# Patient Record
Sex: Female | Born: 1965
Health system: Southern US, Community
[De-identification: ages and names within clinical notes are randomized; demographics above are authoritative.]

## PROBLEM LIST (undated history)

## (undated) DIAGNOSIS — D649 Anemia, unspecified: Secondary | ICD-10-CM

## (undated) DIAGNOSIS — R05 Cough: Secondary | ICD-10-CM

## (undated) DIAGNOSIS — K219 Gastro-esophageal reflux disease without esophagitis: Secondary | ICD-10-CM

## (undated) DIAGNOSIS — J069 Acute upper respiratory infection, unspecified: Secondary | ICD-10-CM

## (undated) DIAGNOSIS — I10 Essential (primary) hypertension: Secondary | ICD-10-CM

## (undated) DIAGNOSIS — J40 Bronchitis, not specified as acute or chronic: Secondary | ICD-10-CM

## (undated) DIAGNOSIS — R059 Cough, unspecified: Secondary | ICD-10-CM

## (undated) DIAGNOSIS — E119 Type 2 diabetes mellitus without complications: Secondary | ICD-10-CM

## (undated) DIAGNOSIS — D259 Leiomyoma of uterus, unspecified: Secondary | ICD-10-CM

## (undated) DIAGNOSIS — M948X9 Other specified disorders of cartilage, unspecified sites: Secondary | ICD-10-CM

## (undated) DIAGNOSIS — M549 Dorsalgia, unspecified: Secondary | ICD-10-CM

## (undated) DIAGNOSIS — N979 Female infertility, unspecified: Secondary | ICD-10-CM

## (undated) DIAGNOSIS — N938 Other specified abnormal uterine and vaginal bleeding: Secondary | ICD-10-CM

## (undated) HISTORY — DX: Leiomyoma of uterus, unspecified: D25.9

## (undated) HISTORY — PX: SUPRACERVICAL ABDOMINAL HYSTERECTOMY: SHX5393

## (undated) HISTORY — DX: Gastro-esophageal reflux disease without esophagitis: K21.9

## (undated) HISTORY — DX: Female infertility, unspecified: N97.9

## (undated) HISTORY — PX: ENDOMETRIAL VAPORIZATION W/ VERSAPOINT: SUR435

## (undated) HISTORY — DX: Essential (primary) hypertension: I10

## (undated) HISTORY — DX: Other specified abnormal uterine and vaginal bleeding: N93.8

## (undated) HISTORY — DX: Type 2 diabetes mellitus without complications: E11.9

## (undated) HISTORY — DX: Other specified disorders of cartilage, unspecified sites: M94.8X9

## (undated) NOTE — *Deleted (*Deleted)
  HEALTH MAINTENANCE RECOMMENDATIONS:  It is recommended that you get at least 30 minutes of aerobic exercise at least 5 days/week (for weight loss, you may need as much as 60-90 minutes). This can be any activity that gets your heart rate up. This can be divided in 10-15 minute intervals if needed, but try and build up your endurance at least once a week.  Weight bearing exercise is also recommended twice weekly.  Eat a healthy diet with lots of vegetables, fruits and fiber.  "Colorful" foods have a lot of vitamins (ie green vegetables, tomatoes, red peppers, etc).  Limit sweet tea, regular sodas and alcoholic beverages, all of which has a lot of calories and sugar.  Up to 1 alcoholic drink daily may be beneficial for women (unless trying to lose weight, watch sugars).  Drink a lot of water.  Calcium recommendations are 1200-1500 mg daily (1500 mg for postmenopausal women or women without ovaries), and vitamin D 1000 IU daily.  This should be obtained from diet and/or supplements (vitamins), and calcium should not be taken all at once, but in divided doses.  Monthly self breast exams and yearly mammograms for women over the age of 52 is recommended.  Sunscreen of at least SPF 30 should be used on all sun-exposed parts of the skin when outside between the hours of 10 am and 4 pm (not just when at beach or pool, but even with exercise, golf, tennis, and yard work!)  Use a sunscreen that says "broad spectrum" so it covers both UVA and UVB rays, and make sure to reapply every 1-2 hours.  Remember to change the batteries in your smoke detectors when changing your clock times in the spring and fall. Carbon monoxide detectors are recommended for your home.  Use your seat belt every time you are in a car, and please drive safely and not be distracted with cell phones and texting while driving.  Pneumovax is recommended given your diabetes and medications.   COVID booster is also recommended (since you are  more than 6 months since your 2nd shot, and have diabetes and are on immunosuppressant medications).  Flu shots are recommended yearly as well.  I recommend getting the new shingles vaccine (Shingrix).  If you have commercial insurance, check with your insurance to verify what your out of pocket cost may be (usually covered as preventative, but better to verify to avoid any surprises, as this vaccine is expensive), and then schedule a nurse visit at our office when convenient (based on the possible side effects as discussed).   This is a series of 2 injections, spaced 2 months apart.  It doesn't have to be exactly 2 months apart (but can't be under 2 months), if that isn't feasible for your schedule, but try and get them close to 2 months (and definitely within 6 months of each other, or else the efficacy of the vaccine drops off).  The above-mentioned vaccines can either be given the same day, or should be separated by 2 weeks minimum.

---

## 1998-09-13 ENCOUNTER — Emergency Department (HOSPITAL_COMMUNITY): Admission: EM | Admit: 1998-09-13 | Discharge: 1998-09-13 | Payer: Self-pay | Admitting: Emergency Medicine

## 1999-10-28 ENCOUNTER — Other Ambulatory Visit: Admission: RE | Admit: 1999-10-28 | Discharge: 1999-10-28 | Payer: Self-pay | Admitting: Obstetrics and Gynecology

## 2000-02-22 ENCOUNTER — Inpatient Hospital Stay (HOSPITAL_COMMUNITY): Admission: AD | Admit: 2000-02-22 | Discharge: 2000-02-22 | Payer: Self-pay | Admitting: Obstetrics & Gynecology

## 2000-05-22 ENCOUNTER — Emergency Department (HOSPITAL_COMMUNITY): Admission: EM | Admit: 2000-05-22 | Discharge: 2000-05-22 | Payer: Self-pay | Admitting: Emergency Medicine

## 2000-05-22 ENCOUNTER — Encounter: Payer: Self-pay | Admitting: Emergency Medicine

## 2000-05-23 ENCOUNTER — Emergency Department (HOSPITAL_COMMUNITY): Admission: EM | Admit: 2000-05-23 | Discharge: 2000-05-23 | Payer: Self-pay | Admitting: Emergency Medicine

## 2000-07-05 HISTORY — PX: MYOMECTOMY: SHX85

## 2000-12-05 ENCOUNTER — Ambulatory Visit (HOSPITAL_COMMUNITY): Admission: RE | Admit: 2000-12-05 | Discharge: 2000-12-05 | Payer: Self-pay | Admitting: Obstetrics and Gynecology

## 2000-12-05 ENCOUNTER — Encounter: Payer: Self-pay | Admitting: Obstetrics and Gynecology

## 2000-12-19 ENCOUNTER — Encounter (INDEPENDENT_AMBULATORY_CARE_PROVIDER_SITE_OTHER): Payer: Self-pay | Admitting: Specialist

## 2000-12-19 ENCOUNTER — Inpatient Hospital Stay (HOSPITAL_COMMUNITY): Admission: RE | Admit: 2000-12-19 | Discharge: 2000-12-22 | Payer: Self-pay | Admitting: Obstetrics and Gynecology

## 2002-01-18 ENCOUNTER — Other Ambulatory Visit: Admission: RE | Admit: 2002-01-18 | Discharge: 2002-01-18 | Payer: Self-pay | Admitting: Obstetrics and Gynecology

## 2002-03-22 ENCOUNTER — Ambulatory Visit (HOSPITAL_COMMUNITY): Admission: RE | Admit: 2002-03-22 | Discharge: 2002-03-22 | Payer: Self-pay | Admitting: Obstetrics and Gynecology

## 2002-03-22 ENCOUNTER — Encounter: Payer: Self-pay | Admitting: Obstetrics and Gynecology

## 2002-04-29 ENCOUNTER — Inpatient Hospital Stay (HOSPITAL_COMMUNITY): Admission: AD | Admit: 2002-04-29 | Discharge: 2002-04-29 | Payer: Self-pay | Admitting: Obstetrics and Gynecology

## 2003-05-20 ENCOUNTER — Other Ambulatory Visit: Admission: RE | Admit: 2003-05-20 | Discharge: 2003-05-20 | Payer: Self-pay | Admitting: Obstetrics and Gynecology

## 2003-11-19 ENCOUNTER — Emergency Department (HOSPITAL_COMMUNITY): Admission: EM | Admit: 2003-11-19 | Discharge: 2003-11-19 | Payer: Self-pay | Admitting: Family Medicine

## 2004-02-18 ENCOUNTER — Inpatient Hospital Stay (HOSPITAL_COMMUNITY): Admission: AD | Admit: 2004-02-18 | Discharge: 2004-02-18 | Payer: Self-pay | Admitting: Obstetrics and Gynecology

## 2004-07-17 ENCOUNTER — Ambulatory Visit: Payer: Self-pay | Admitting: Internal Medicine

## 2005-06-14 ENCOUNTER — Other Ambulatory Visit: Admission: RE | Admit: 2005-06-14 | Discharge: 2005-06-14 | Payer: Self-pay | Admitting: Obstetrics and Gynecology

## 2005-11-03 ENCOUNTER — Encounter: Admission: RE | Admit: 2005-11-03 | Discharge: 2005-11-03 | Payer: Self-pay | Admitting: Family Medicine

## 2006-08-26 ENCOUNTER — Emergency Department (HOSPITAL_COMMUNITY): Admission: EM | Admit: 2006-08-26 | Discharge: 2006-08-27 | Payer: Self-pay | Admitting: Emergency Medicine

## 2006-08-27 ENCOUNTER — Ambulatory Visit (HOSPITAL_COMMUNITY): Admission: RE | Admit: 2006-08-27 | Discharge: 2006-08-27 | Payer: Self-pay | Admitting: Emergency Medicine

## 2006-09-04 ENCOUNTER — Inpatient Hospital Stay (HOSPITAL_COMMUNITY): Admission: AD | Admit: 2006-09-04 | Discharge: 2006-09-04 | Payer: Self-pay | Admitting: Obstetrics and Gynecology

## 2008-10-14 ENCOUNTER — Emergency Department (HOSPITAL_COMMUNITY): Admission: EM | Admit: 2008-10-14 | Discharge: 2008-10-15 | Payer: Self-pay | Admitting: Emergency Medicine

## 2010-03-12 ENCOUNTER — Emergency Department (HOSPITAL_COMMUNITY): Admission: EM | Admit: 2010-03-12 | Discharge: 2010-03-12 | Payer: Self-pay | Admitting: Emergency Medicine

## 2010-04-02 ENCOUNTER — Encounter: Admission: RE | Admit: 2010-04-02 | Discharge: 2010-04-02 | Payer: Self-pay | Admitting: Obstetrics and Gynecology

## 2010-06-11 ENCOUNTER — Inpatient Hospital Stay (HOSPITAL_COMMUNITY): Admission: AD | Admit: 2010-06-11 | Discharge: 2009-08-16 | Payer: Self-pay | Admitting: Obstetrics and Gynecology

## 2010-07-26 ENCOUNTER — Encounter: Payer: Self-pay | Admitting: Obstetrics and Gynecology

## 2010-09-23 ENCOUNTER — Other Ambulatory Visit: Payer: Self-pay | Admitting: General Practice

## 2010-09-23 ENCOUNTER — Ambulatory Visit
Admission: RE | Admit: 2010-09-23 | Discharge: 2010-09-23 | Disposition: A | Discharge status: Home / Self Care | Payer: Managed Care, Other (non HMO) | Source: Ambulatory Visit | Attending: General Practice | Admitting: General Practice

## 2010-09-23 DIAGNOSIS — R05 Cough: Secondary | ICD-10-CM

## 2010-09-23 DIAGNOSIS — R053 Chronic cough: Secondary | ICD-10-CM

## 2010-09-23 LAB — URINALYSIS, ROUTINE W REFLEX MICROSCOPIC
Bilirubin Urine: NEGATIVE
Specific Gravity, Urine: >1.03 — ABNORMAL HIGH (ref 1.005–1.030)
Urobilinogen, UA: 0.2 mg/dL (ref 0.0–1.0)

## 2010-09-23 LAB — URINE CULTURE: Culture: NO GROWTH

## 2010-09-23 LAB — URINE MICROSCOPIC-ADD ON

## 2010-09-25 ENCOUNTER — Ambulatory Visit (HOSPITAL_COMMUNITY)
Admission: RE | Admit: 2010-09-25 | Discharge: 2010-09-25 | Disposition: A | Discharge status: Home / Self Care | Payer: Medicare HMO | Source: Ambulatory Visit | Attending: General Practice | Admitting: General Practice

## 2010-09-25 DIAGNOSIS — R079 Chest pain, unspecified: Secondary | ICD-10-CM | POA: Insufficient documentation

## 2010-09-29 ENCOUNTER — Other Ambulatory Visit (HOSPITAL_COMMUNITY): Payer: Self-pay | Admitting: Gastroenterology

## 2010-09-29 DIAGNOSIS — R1013 Epigastric pain: Secondary | ICD-10-CM

## 2010-10-08 ENCOUNTER — Other Ambulatory Visit (HOSPITAL_COMMUNITY): Payer: Self-pay

## 2010-10-09 ENCOUNTER — Other Ambulatory Visit (HOSPITAL_COMMUNITY): Payer: Self-pay

## 2010-10-12 ENCOUNTER — Other Ambulatory Visit (HOSPITAL_COMMUNITY): Payer: Self-pay

## 2010-10-12 ENCOUNTER — Ambulatory Visit (HOSPITAL_COMMUNITY)
Admission: RE | Admit: 2010-10-12 | Discharge: 2010-10-12 | Disposition: A | Discharge status: Home / Self Care | Payer: Medicare HMO | Source: Ambulatory Visit | Attending: Gastroenterology | Admitting: Gastroenterology

## 2010-10-12 ENCOUNTER — Encounter (HOSPITAL_COMMUNITY)
Admission: RE | Admit: 2010-10-12 | Discharge: 2010-10-12 | Disposition: A | Discharge status: Home / Self Care | Payer: Medicare HMO | Source: Ambulatory Visit | Attending: Gastroenterology | Admitting: Gastroenterology

## 2010-10-12 DIAGNOSIS — R1013 Epigastric pain: Secondary | ICD-10-CM | POA: Insufficient documentation

## 2010-10-12 MED ORDER — TECHNETIUM TC 99M MEBROFENIN IV KIT
5.0000 | PACK | Freq: Once | INTRAVENOUS | Status: AC | PRN
  Administered 2010-10-12: 5 via INTRAVENOUS

## 2010-10-14 LAB — URINALYSIS, ROUTINE W REFLEX MICROSCOPIC
Glucose, UA: NEGATIVE mg/dL
Leukocytes, UA: NEGATIVE

## 2010-10-14 LAB — DIFFERENTIAL: Basophils Absolute: 0 10*3/uL (ref 0.0–0.1)

## 2010-10-14 LAB — COMPREHENSIVE METABOLIC PANEL
CO2: 29 mEq/L (ref 19–32)
Calcium: 9.5 mg/dL (ref 8.4–10.5)
Creatinine, Ser: 1 mg/dL (ref 0.4–1.2)
GFR calc Af Amer: >60 mL/min (ref >60)
Sodium: 137 mEq/L (ref 135–145)
Total Protein: 8.2 g/dL (ref 6.0–8.3)

## 2010-10-14 LAB — LIPASE, BLOOD: Lipase: 41 U/L (ref 11–59)

## 2010-10-14 LAB — CBC: MCHC: 33.2 g/dL (ref 30.0–36.0)

## 2010-10-14 LAB — URINE MICROSCOPIC-ADD ON

## 2010-10-30 ENCOUNTER — Emergency Department (HOSPITAL_COMMUNITY)
Admission: EM | Admit: 2010-10-30 | Discharge: 2010-10-30 | Disposition: A | Discharge status: Home / Self Care | Payer: Private Health Insurance - Indemnity | Attending: Emergency Medicine | Admitting: Emergency Medicine

## 2010-10-30 DIAGNOSIS — K219 Gastro-esophageal reflux disease without esophagitis: Secondary | ICD-10-CM | POA: Insufficient documentation

## 2010-10-30 DIAGNOSIS — M7989 Other specified soft tissue disorders: Secondary | ICD-10-CM | POA: Insufficient documentation

## 2010-10-30 DIAGNOSIS — M79609 Pain in unspecified limb: Secondary | ICD-10-CM | POA: Insufficient documentation

## 2010-10-30 DIAGNOSIS — Z79899 Other long term (current) drug therapy: Secondary | ICD-10-CM | POA: Insufficient documentation

## 2010-10-30 DIAGNOSIS — I10 Essential (primary) hypertension: Secondary | ICD-10-CM | POA: Insufficient documentation

## 2010-11-20 NOTE — Op Note (Signed)
[facility]  Patient:    Michelle Mack, Michelle Mack                    MRN: [id number] Proc. Date: 12/19/00 Adm. Date:  [id number] Attending:  Silverio Lay A                           Operative Report  PREOPERATIVE DIAGNOSIS:       Severe menorrhagia with uterine fibroids.  POSTOPERATIVE DIAGNOSIS:      Extensive uterine adenomyosis with small uterine fibroids.  PROCEDURE:                    Laparotomy with resection of extensive adenomyosis and myomectomy.  SURGEON:                      Silverio Lay, M.D.  ASSISTANT:                    Cordelia Pen A. Rosalio Macadamia, M.D.  ESTIMATED BLOOD LOSS:         200 cc.  DESCRIPTION OF PROCEDURE:     After being informed of the planned procedure with the possible complications including bleeding; infection; injury to bowel, bladder or ureter; the risk of tubal occlusion postoperatively due to fibrosis; the risk of entering the endometrial cavity; the need for cesarean section if pregnancy occurs and answering all of the patients questions, informed consent was obtained.  The patient was taken to OR #8 and given general anesthesia with endotracheal intubation.  She was placed in the dorsal decubitus with the knees flexed, prepped and draped in a sterile fashion.  She was given clindamycin 900 mg IV preoperatively.  A Foley catheter was inserted as well as an intrauterine balloon catheter hooked to diluted methylene blue for intraoperative assessment of the endometrial cavity.  Her legs were then extended slightly flexed at the knee and she was draped in a sterile fashion. The skin, subcutaneous tissue and fat were incised in the Pfannenstiel manner as well as the muscle fascia.  The linea alba was dissected and the peritoneum was entered in the midline.  The uterus was bulky with an overall volume of approximately 10-11 weeks pregnancy.  Both tubes were normal.  Both ovaries were normal.  The posterior aspect of the  uterus seemed very normal with a normal length of cervix.  The anterior aspect seemed to be bulging with an intramural fibroid.  We then noted a small subserosal fibroid at the isthmus anterior part of the uterus, measuring approximately 1 cm.  No adhesions were found.  No endometriosis.  The uterus was exteriorized from the pelvic cavity through the Pfannenstiel incision and held with warm compresses. The anterior serosa was infiltrated with a dilution of vasopressin 20 cc in 30 cc using approximately 10 cc and a midline anterior incision was made with cautery.  This allowed [place] to start blunt dissection of the bulk of the fibroid.  During this dissection process, it became clearer and clearer that there were no dissection plane to remove this fibroid.  We continued bluntly with traction and countertraction, and finally entered the endometrial cavity. At that time, it was clear that it was impossible to remove the whole fibroid and that it was very adherent, if not completely engulfing the uterine cavity. We then, with our fingers inside the endometrial cavity, shed the remainder of the anterior part of that bulk  mass using sharp scissors.  This was sent for frozen section, being so atypical.  Using the intrauterine balloon, we were not able to see the isthmus of the endometrial cavity.  We then proceeded with blunt dissection of the 1 cm subserosal fibroid, which was removed very easily.  Frozen section returned as extensive adenomyosis with no signs of increased mitotic activity.  Due to this pathology report, it was decided to stop the procedure and try to reclose the uterine cavity.  Using the intrauterine balloon catheter as a guide, the endometrial cavity was sutured using 4-0 Vicryl interrupted sutures.  This was covered with subserosal interrupted sutures of 0 Vicryl to reinforce the endometrial cavity.  Then, using a baseball stitch of 0 Vicryl, the serosa was closed.  Some bleeding  was noted in the middle of the incision, controlled with four mattress sutures of 0 Vicryl.  There was no more active bleeding but, as a precaution due to small oozing, Surgicel was applied to the whole incision.  The uterus was returned to the pelvic cavity.  We irrigated with warm saline and noted adequate hemostasis.  A layer of INTERCEED was then applied to the anterior aspect of the incision.  Under fascial hemostasis was controlled with cautery and the fascia was closed with two running sutures of 0 Vicryl meeting in the midline. The fat was irrigated with warm saline.  The incision was infiltrated with 20 cc of Marcaine 0.25% and the skin was closed with a subcuticular running suture of 3-0 Vicryl and Steri-Strips.  Instrument and sponge counts were complete x 2.  Estimated blood loss was 200 cc.  The procedure was well tolerated by the patient, who was taken to the recovery room in stable condition.  The Foley catheter remained in her bladder, but the intrauterine catheter was removed.  We will maintain the patient on clindamycin IV and will add high dose IV estrogen to promote endometrial healing. DD:  12/19/00 TD:  12/20/00 Job: [id number]

## 2010-11-20 NOTE — H&P (Signed)
[facility]  Patient:    Michelle Mack, Michelle Mack                      MRN: [id number] Adm. Date:  12/19/00 Attending:  Silverio Lay, M.D.                         History and Physical  REASON FOR ADMISSION:         Uterine fibroids with menorrhagia.  HISTORY OF PRESENT ILLNESS:   This is a 45 year old married black female, gravida 4, para 1, aborta 3; seen by me on an emergency basis October 26, 2000, complaining of severe menorrhagia.  She was reporting at that time a menstrual period which started on October 09, 2000 on time for a regular cycle (every 28-30 days), but was now very heavy requiring one tampon every 10 min.  She has been using ibuprofen 400 mg q.4h. with no improvement.  She was seeing clots as large as 4 cm in diameter.  She is currently not using any contraceptives, since she is desiring pregnancy.  She is status post myomectomy by hysteroscopy on July 25, 1997; which, at the time of hysteroscopy, revealed a raised area on the posterior submucosal surface.  She has been known for uterine fibroids since 1998, at which time uterine volume was 10.7 cm x 5.7 cm x 7.6 cm.  Most recent ultrasound on October 26, 2000 revealed a uterine volume of 12.1 cm x 9.6 cm x 9.8 cm; with at least three fibroids -- one fundal, measuring 7.6 x 7.8 x 7.9 cm and two anterior fibroids measuring 3.3 x 1.8 x 3.0 cm, 1.7 x 1.8 x 2.1 cm.  Both ovaries were well seen and normal.  Endometrium was difficult to assess, due to the distortion from the uterine fibroids.  A hysterosalpingogram on December 05, 2000 revealed patent fallopian tubes, with permanent concavity of the fundal portion of the endometrial cavity; questioning the possibility of a septate uterus.  For that reason, we proceeded to a sonohistogram on December 16, 2000, which revealed a normal uterine cavity, no bulging masses and a normal-appearing endometrium.  REVIEW OF SYSTEMS:            CONSTITUTIONAL:   Negative.  EYES:  Negative. EARS, NOSE, THROAT, MOUTH:  Negative.  CARDIOVASCULAR:  Negative. RESPIRATORY:  Negative.  GASTROINTESTINAL:  Negative.  GENITOURINARY: Negative.  MUSCULOSKELETAL:  Negative.  NEUROLOGIC: Negative.  PSYCHIATRIC: Negative.  ENDOCRINE:  Negative.  PAST MEDICAL HISTORY:         Status post TAB x 2.  SAB x 1, with retained products of conception needing a repeat D&C.  ALLERGIES:                    PENICILLIN.  PAST SURGICAL HISTORY:        Status post hysteroscopy myomectomy.  SOCIAL HISTORY:               Married.  Nonsmoker.  FAMILY HISTORY:               Maternal grandmother with breast cancer.  PHYSICAL EXAMINATION:  VITAL SIGNS:                  Normal.  GENERAL:                      In no acute distress.  Well-developed, well-nourished; normal habitus.  NECK:  Negative.  RESPIRATORY:                  Negative.  CARDIOVASCULAR:               Negative.  ABDOMEN:                      Soft, nontender; no hepatosplenomegaly, no hernia.  LYMPHATICS:                   Lymph nodes are negative in neck, axilla and groin area.  NEUROLOGIC:                   The patient is well oriented to time, place and person; with a normal mood and effect.  GYNECOLOGIC:                  Revealed normal vulva, normal vagina.  Cervix is normal.  Uterus is bulky and irregular in shape; approximately 12-14 week size.  Both adnexa are normal.  Perineum is negative.  LAB WORK:                     Hemoglobin (October 26, 2000) 10.7, platelet count 376, TSH normal, prolactin normal.  Pregnancy test was negative.  ASSESSMENT:                   Uterine fibroids with menorrhagia; desiring pregnancy.  PLAN:                         The patient is admitted to undergo laparotomy myomectomy.  The procedure, as well as possible complications, have been well reviewed with the patient -- which include: bleeding, infection, injury to bowel, bladder or  ureters, postoperative fever, postoperative tubal obstruction.  Hospital stay and recovery period have been reviewed as well. Consent is obtained. DD:  12/16/00 TD:  12/16/00 Job: [id number]

## 2010-11-20 NOTE — Discharge Summary (Signed)
[facility]  Patient:    Michelle Mack, Michelle Mack Visit Number: [id number] MRN: [id number]          Service Type: GYN Location: [id number] Attending Physician:  Esmeralda Arthur Admit Date:  12/19/2000 Discharge Date: 12/22/2000                             Discharge Summary  REASON FOR ADMISSION:         Uterine fibroids with menorrhagia.  HISTORY OF PRESENT ILLNESS:   This is a 45 year old married black female gravida 4, para 1, abortus 3 admitted for surgical treatment of severe menorrhagia due to uterine fibroids diagnosed on ultrasound in April 2002 after an acute episode of menorrhagia.  At that time ultrasound revealed a uterine volume of 12.1 cm x 9.6 cm x 9.8 cm with at least three measurable fibroids, the largest being fundal measuring close to 8 cm diameter.  In preoperative evaluation the patient underwent hysterosalpingogram which revealed bilateral patent fallopian tubes with some concavity at the fundal questioning the endometrial cavity.  For that reason a sonohysterogram was performed in preoperative evaluation which revealed a normal uterine cavity.  Operative course revealed a bulky uterus with a very difficult plane of dissection.  Most of the mass was located in the anterior wall of the uterus through the endometrial cavity extending to the posterior wall very slightly. The dissection was very difficult and the uterine cavity had to be reconstructed.  The specimen was sent to pathology for a frozen section due to the unusual nature of the mass and frozen section came back adenomyosis.  A second fibroid was removed which was approximately 1 cm subserosal with a very clear plane of dissection.  During procedure we were able to reconstruct the whole uterine cavity due to the preoperative placement of intrauterine catheter.  Postoperative course was remarkable for severe, but stable anemia with a discharge hemoglobin of 6.4 for which  the patient remained essentially asymptomatic besides a mild tolerable headache.  On postoperative day #2 she experienced mild elevation of temperature which rapidly resolved with increasing ambulation and incentive spirometry.  Pathology report revealed myometrium with adenomyosis and leiomyoma with benign myometrium.  Patient received her discharge on postoperative day #3 on December 22, 2000 in a well and stable condition.  She was given a prescription of Motrin 600 mg and Tylox for pain control as well as clindamycin 450 mg q.i.d. for 10 days due to the severe anemia and mild postoperative temperature.  She was also given a prescription of Nu-Iron 150 mg b.i.d. for her anemia.  FINAL DIAGNOSES:              Severe menorrhagia due to extensive uterine adenomyosis, status post resection of adenomyosis and myomectomy, postoperative anemia.  CONDITION ON DISCHARGE:       Well and stable. Attending Physician:  Esmeralda Arthur DD:  01/09/01 TD:  01/09/01 Job: [id number]

## 2010-12-10 ENCOUNTER — Other Ambulatory Visit: Payer: Self-pay | Admitting: Gastroenterology

## 2010-12-10 DIAGNOSIS — R1013 Epigastric pain: Secondary | ICD-10-CM

## 2010-12-14 ENCOUNTER — Ambulatory Visit
Admission: RE | Admit: 2010-12-14 | Discharge: 2010-12-14 | Disposition: A | Discharge status: Home / Self Care | Payer: Private Health Insurance - Indemnity | Source: Ambulatory Visit | Attending: Gastroenterology | Admitting: Gastroenterology

## 2010-12-14 DIAGNOSIS — R1013 Epigastric pain: Secondary | ICD-10-CM

## 2010-12-14 MED ORDER — IOHEXOL 300 MG/ML  SOLN
100.0000 mL | Freq: Once | INTRAMUSCULAR | Status: AC | PRN
  Administered 2010-12-14: 100 mL via INTRAVENOUS

## 2010-12-30 ENCOUNTER — Ambulatory Visit: Payer: Private Health Insurance - Indemnity | Admitting: Family Medicine

## 2010-12-31 ENCOUNTER — Encounter: Payer: Self-pay | Admitting: Family Medicine

## 2010-12-31 ENCOUNTER — Ambulatory Visit (INDEPENDENT_AMBULATORY_CARE_PROVIDER_SITE_OTHER): Payer: Private Health Insurance - Indemnity | Admitting: Family Medicine

## 2010-12-31 VITALS — BP 112/70 | HR 72 | Ht 64.0 in | Wt 182.0 lb

## 2010-12-31 DIAGNOSIS — M940 Chondrocostal junction syndrome [Tietze]: Secondary | ICD-10-CM

## 2010-12-31 MED ORDER — PREDNISONE 10 MG PO KIT: PACK | ORAL | Status: DC

## 2010-12-31 NOTE — Patient Instructions (Signed)
Costochondritis (Costochondral Separation / Tietze Syndrome) Costochondritis (Tietze syndrome), or costochondral separation, is a swelling and irritation (inflammation) of the tissue (cartilage) that connects your ribs with your breastbone (sternum). It may occur on its own (spontaneously), through damage caused by an accident (trauma), or simply from coughing or minor exercise. It may take up to 6 weeks to get better and longer if you are unable to be conservative in your activities. HOME CARE INSTRUCTIONS  Avoid exhausting physical activity. Try not to strain your ribs during normal activity. This would include any activities using chest, belly (abdominal) and side muscles, especially if heavy weights are used.   Use ice for 15 minutes per hour while awake for the first 2 days. Place the ice in a plastic bag, and place a towel between the bag of ice and your skin.  If ice isn't helpful, then try heat, or try alternating the two.  Only take over-the-counter or prescription medicines for pain, discomfort, or fever as directed by your caregiver.  SEEK IMMEDIATE MEDICAL CARE IF:  Your pain increases or you are very uncomfortable.   An oral temperature above 101 develops.   You develop difficulty with your breathing.   You cough up blood.   You develop worse chest pains, shortness of breath, sweating, or vomiting.   You develop new, unexplained problems (symptoms).  MAKE SURE YOU:   Understand these instructions.   Will watch your condition.   Will get help right away if you are not doing well or get worse.  Document Released: 03/31/2005 Document Re-Released: 09/15/2009 [facility] Patient Information 2011 [place], [place].   Stop taking ibuprofen and diclofenac while on the prednisone.  You may use Tylenol (acetominophen) if needed for pain. Continue to take your stomach medication (ie. Dexilant).  Call if you are having problems, otherwise we will plan to see you back in about 2 weeks

## 2010-12-31 NOTE — Progress Notes (Signed)
Patient presents for evaluation of possible fibromyalgia.  Dr. Loreta Ave suggested that she might have this based on patient's complaints of pain.  She has pain that is along her ribs, under both breasts, and spreads up the chest to the throat.  Has had CT scans (chest), abdominal ultrasound, chest x-ray and EKG.  Findings all reportedly normal.  U/S had + Murphy's sign during exam, but otherwise normal.  Suggested consideration of HIDA scan.  She has never had upper endoscopy, but Dr. Loreta Ave didn't feel that it was indicated, it would "be wasting your money".  Has tried multiple PPI (Protonix, Nexium and Dexilant) none of which seem to help.  The only thing that seems to help with her pain is ibuprofen 800mg  or Diclofenac.  Dr. Loreta Ave has given her muscle relaxants (Flexeril) and Vicodin, neither of which seem to help.  She has had this chest pain for 3 months.  Increased pain with movements of her arm (ie reaching to wipe her bottom), hurts to get out of bed, is tender to the touch.  She does find that her RUQ pain has improved since being on PPI's.  Past Medical History  Diagnosis Date  . GERD (gastroesophageal reflux disease)   . Hypertension   . Fibroid uterus     GYN--Dr. Estanislado Pandy    Past Surgical History  Procedure Date  . Fibroid excision     Dr. Estanislado Pandy     History   Social History  . Marital Status: Married    Spouse Name: N/A    Number of Children: N/A  . 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 Smokeless tobacco: Never Used  . Alcohol Use: Yes     1-2 drinks, once month  . Drug Use: No  . Sexually Active: Not on file   Other Topics Concern  . Not on file   Social History Narrative  . No narrative on file    Family History  Problem Relation Age of Onset  . Hypertension Mother   . Hypertension Father   . Hypertension Sister   . Thyroid nodules Sister   . Diabetes  Maternal Grandmother   . Cancer Maternal Grandmother     breast    Current outpatient prescriptions:dexlansoprazole (DEXILANT) 60 MG capsule, Take 60 mg by mouth daily.  , Disp: , Rfl: ;  ibuprofen (ADVIL,MOTRIN) 800 MG tablet, Take 800 mg by mouth 2 (two) times daily.  , Disp: , Rfl: ;  valsartan-hydrochlorothiazide (DIOVAN-HCT) 160-12.5 MG per tablet, Take 1 tablet by mouth daily.  , Disp: , Rfl: ;  cyclobenzaprine (FLEXERIL) 10 MG tablet, Take 10 mg by mouth 3 (three) times daily as needed.  , Disp: , Rfl:  diclofenac (VOLTAREN) 75 MG EC tablet, Take 75 mg by mouth 2 (two) times daily.  , Disp: , Rfl: ;  pantoprazole (PROTONIX) 40 MG tablet, Take 40 mg by mouth daily.  , Disp: , Rfl: ;  PredniSONE 10 MG KIT, Take as directed, Disp: 48 each, Rfl: 0  Allergies  Allergen Reactions  . Penicillins Shortness Of Breath and Swelling   ROS:  Some pain in her back for the last 3 weeks.  Had cough treated with Avelox back in January.  Cough resolved, but had recurrent cough in April 2012. This was again treated with Avelox. (had cough with exertion, laughing) Cough resolved.  Intermittently has  a deep cough.  Has some phlegm just in the mornings.  Denies fevers, runny nose, sinus congestion, sore throat or post-nasal drip.  Feels like her voice has gotten deeper/hoarse over the last month.  Denies stool changes, heartburn/belching, skin rash, GU complaints or other concerns.  PHYSICAL EXAM: BP 112/70  Pulse 72  Ht 5\' 4"  (1.626 m)  Wt 182 lb (82.555 kg)  BMI 31.24 kg/m2  LMP 12/25/2010 Well developed, pleasant African American female, accompanied by her daughter, in no distress.  Appears comfortable. HEENT:  PERRL, EOMI, OP clear.  Nasal mucosa normal, TM's normal.  Neck: no lymphadenopathy or thyromegaly.  Area of discomfort is anterior neck, slight tenderness over SCM muscle anteriorly, but no pain in strength testing of these muscles (contraction against resistance). Chest: Diffusely tender over  bilateral costochondral junctions, from the sternal notch/Ione joint, all the way to floating ribs.  Some tenderness in center of sternum at mid-sternal level. Heart: regular rate and rhythm without murmurs Lungs: clear bilaterally Back: Mild tenderness along lumbar spine; some tenderness along trapezius muscles and paraspinous muscles, but mild/diffuse, no specific trigger points along back. Abdomen:  Mild epigastric tenderness.  NO RUQ or Murphy, no rebound or guarding. No hepatosplenomegaly. Extremities: no clubbing, cyanosis or edema Skin: no rashes  ASSESSMENT/PLAN: 1. Costochondritis  PredniSONE 10 MG KIT   Steroid course.  Risks and side effects reviewed Stop NSAIDs while taking the steroids.  Recommend trial of heat (vs ice).  Heat and massage to back. Patient seems very frustrated, and feels like she needs an EGD.  Spent a lot of time discussing and counseling what endoscopy would show, the indications, and that her pain, which is musculoskeletal, is not due to GI etiology.  Perhaps had component of reflux at one point, but PPI's seem to have helped some.  No longer having RUQ pain.  It doesn't appear at this point that she meets criteria for fibromyalgia.  Inflammation/pain really seems more limited to her chest wall.    F/u in 2 weeks.  Plan to order labs if symptoms haven't resolved.

## 2011-01-14 ENCOUNTER — Ambulatory Visit (INDEPENDENT_AMBULATORY_CARE_PROVIDER_SITE_OTHER): Payer: Private Health Insurance - Indemnity | Admitting: Family Medicine

## 2011-01-14 DIAGNOSIS — M940 Chondrocostal junction syndrome [Tietze]: Secondary | ICD-10-CM

## 2011-01-14 NOTE — Progress Notes (Signed)
  Subjective:    Patient ID: Michelle Mack, female    DOB: 1966/05/21, 45 y.o.   MRN: [id number]  HPI  Patient was not seen today.  Apparently she arrived on time (15 minutes prior to scheduled appt time, as instructed by the front), but she was only on a 30 minute break from work, and was unable to wait to be seen.  Patient left without being seen.  Review of Systems     Objective:   Physical Exam        Assessment & Plan:

## 2011-01-20 ENCOUNTER — Ambulatory Visit (INDEPENDENT_AMBULATORY_CARE_PROVIDER_SITE_OTHER): Payer: Private Health Insurance - Indemnity | Admitting: Family Medicine

## 2011-01-20 ENCOUNTER — Encounter: Payer: Self-pay | Admitting: Family Medicine

## 2011-01-20 VITALS — BP 98/60 | HR 80 | Ht 64.0 in | Wt 183.0 lb

## 2011-01-20 DIAGNOSIS — M940 Chondrocostal junction syndrome [Tietze]: Secondary | ICD-10-CM

## 2011-01-20 DIAGNOSIS — R05 Cough: Secondary | ICD-10-CM

## 2011-01-20 MED ORDER — DEXLANSOPRAZOLE 60 MG PO CPDR: 60.0000 mg | DELAYED_RELEASE_CAPSULE | Freq: Every day | ORAL | Status: DC | PRN

## 2011-01-20 MED ORDER — IBUPROFEN 800 MG PO TABS: 800.0000 mg | ORAL_TABLET | Freq: Three times a day (TID) | ORAL | Status: DC | PRN

## 2011-01-20 MED ORDER — PANTOPRAZOLE SODIUM 40 MG PO TBEC: 40.0000 mg | DELAYED_RELEASE_TABLET | Freq: Every day | ORAL | Status: DC | PRN

## 2011-01-20 NOTE — Progress Notes (Signed)
Patient presents for 2 week f/u on chest pain.  She was treated with a course of prednisone and reportedly now feels "great".  Finished steroids over a week ago. Denies any recurrent chest pain.  Denies reflux, dysphagia, sore throat.  She is complaining of cough x 4 days, worse at night and in morning.  Sometimes gets a small amount of green mucus in the morning, otherwise dry.  She stopped taking her reflux medications  Past Medical History  Diagnosis Date  . GERD (gastroesophageal reflux disease)   . Hypertension   . Fibroid uterus     GYN--Dr. Estanislado Pandy    Past Surgical History  Procedure Date  . Fibroid excision     Dr. Estanislado Pandy     History   Social History  . Marital Status: Married    Spouse Name: N/A    Number of Children: N/A  . 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 Smokeless tobacco: Never Used  . Alcohol Use: Yes     1-2 drinks, once month  . Drug Use: No  . Sexually Active: Not on file   Other Topics Concern  . Not on file   Social History Narrative  . No narrative on file    Family History  Problem Relation Age of Onset  . Hypertension Mother   . Hypertension Father   . Hypertension Sister   . Thyroid nodules Sister   . Diabetes Maternal Grandmother   . Cancer Maternal Grandmother     breast    Current outpatient prescriptions:valsartan-hydrochlorothiazide (DIOVAN-HCT) 160-12.5 MG per tablet, Take 1 tablet by mouth daily.  , Disp: , Rfl: ;  dexlansoprazole (DEXILANT) 60 MG capsule, Take 1 capsule (60 mg total) by mouth daily as needed., Disp: 30 capsule, Rfl: 0;  ibuprofen (ADVIL,MOTRIN) 800 MG tablet, Take 1 tablet (800 mg total) by mouth every 8 (eight) hours as needed for pain (take with food)., Disp: 30 tablet, Rfl:  pantoprazole (PROTONIX) 40 MG tablet, Take 1 tablet (40 mg total) by mouth daily as needed., Disp: 30 tablet, Rfl:  0  Allergies  Allergen Reactions  . Penicillins Shortness Of Breath and Swelling   ROS: Denies fevers, SOB, chest pain, headaches, sinus pain, sore throat, abdominal pain, skin rash or other concerns  PHYSICAL EXAM: BP 98/60  Pulse 80  Ht 5\' 4"  (1.626 m)  Wt 183 lb (83.008 kg)  BMI 31.41 kg/m2  LMP 12/25/2010 Well developed, pleasant female, in no distress.  +throat clearing, no coughing Neck: no lymphadenopathy or thyromegaly. No c-spine tenderness Heart: regular rate and rhythm Lungs: clear bilaterally Abdomen: soft, nontender, no organomegaly or mass Chest: very minimal tenderness at costochondral junction Extremities: no edema Skin: no rash  ASSESSMENT/PLAN: 1. Costochondritis  ibuprofen (ADVIL,MOTRIN) 800 MG tablet   resolved  2. Cough    Restart ibuprofen prn if chest pain recurs.  Cough--may have some contribution of postnasal drip--using claritin or other OTC medications can help dry up the drainage;  Cough at night can also be caused by reflux (I would expect this to be more chronic though)--if cough persists, restart reflux medications

## 2011-01-20 NOTE — Patient Instructions (Addendum)
If you start to have pain again in the ribs as before, try to start treatment early, rather than putting it off until pain is intolerable.  You can use warm compresses to the chest.  You can start taking either 800 mg of ibuprofen (ie 4 advil or motrin at once) three times a day with food OR Aleve 2 tablets twice a day with food for up to 10 days at a time.  You might want to re-start your stomach/reflux medications if the anti-inflammatories bother your stomach.  Cough--may have some contribution of postnasal drip--using claritin or other OTC medications can help dry up the drainage;  Cough at night can also be caused by reflux (I would expect this to be more chronic though)--if cough persists, restart reflux medications

## 2011-02-09 ENCOUNTER — Ambulatory Visit (INDEPENDENT_AMBULATORY_CARE_PROVIDER_SITE_OTHER): Payer: Private Health Insurance - Indemnity | Admitting: Medical

## 2011-02-09 ENCOUNTER — Encounter: Payer: Self-pay | Admitting: Medical

## 2011-02-09 VITALS — BP 110/70 | HR 80 | Temp 98.3°F | Resp 16 | Ht 64.0 in | Wt 183.0 lb

## 2011-02-09 DIAGNOSIS — J40 Bronchitis, not specified as acute or chronic: Secondary | ICD-10-CM

## 2011-02-09 MED ORDER — BENZONATATE 200 MG PO CAPS: 200.0000 mg | ORAL_CAPSULE | Freq: Three times a day (TID) | ORAL | Status: AC | PRN

## 2011-02-09 MED ORDER — METHYLPREDNISOLONE 4 MG PO KIT: PACK | ORAL | Status: AC

## 2011-02-09 NOTE — Progress Notes (Signed)
  Subjective:     Michelle Mack is a 45 y.o. female who presents for a three-day history of bad cough, croupy nonproductive cough, worse at night, sore throat, nausea. Using salt water gargles, but no other treatments. She notes fatigue. Denies sick contacts.  No other aggravating or relieving factors.  No other c/o.  The following portions of the patient's history were reviewed and updated as appropriate: allergies, current medications, past family history, past medical history, past social history, past surgical history and problem list.  Past Medical History  Diagnosis Date  . GERD (gastroesophageal reflux disease)   . Hypertension   . Fibroid uterus     GYN--Dr. Rivard    Review of Systems Constitutional:  denies chills, fever, sweats, anorexia Skin: denies rash HEENT:  denies ear pain, itchy watery eyes Cardiovascular: denies chest pain, palpitations Lungs: denies wheezing, hemoptysis, orthopnea, PND Abdomen: denies abdominal pain, nausea, vomiting, diarrhea GU: denies dysuria Extremities: denies edema, myalgias, arthralgias  Objective:   Filed Vitals:   02/09/11 1339  BP: 110/70  Pulse: 80  Temp: 98.3 F (36.8 C)  Resp: 16    General appearance: Alert, WD/WN, no distress, black female, croupy sounding cough                             Skin: warm, no rash, no diaphoresis                           Head: no sinus tenderness                            Eyes: conjunctiva normal, corneas clear, PERRLA                            Ears: pearly TMs, external ear canals normal                          Nose: septum midline, turbinates swollen, with erythema and clear discharge             Mouth/throat: MMM, tongue normal, mild pharyngeal erythema                           Neck: supple, no adenopathy, no thyromegaly, nontender                          Heart: RRR, normal S1, S2, no murmurs                         Lungs: CTA, no rhonchi, wheezes, no rales  Extremities: no edema, nontender     Assessment:   Encounter Diagnosis  Name Primary?  . Bronchitis Yes     Plan:   Prescription given today for Tessalon and Medrol dose pack as below.  Discussed diagnosis and treatment of what appears to be viral vs croupy bronchitis.  Suggested symptomatic OTC remedies for cough and congestion.  Nasal saline spray for nasal congestion.  Tylenol or Ibuprofen OTC for fever and malaise.  Call/return in 2-3 days if symptoms are worse or not improving.  Advised that cough may linger even after the infection is improved.

## 2011-03-03 ENCOUNTER — Other Ambulatory Visit: Payer: Self-pay | Admitting: Family Medicine

## 2011-03-03 ENCOUNTER — Ambulatory Visit (INDEPENDENT_AMBULATORY_CARE_PROVIDER_SITE_OTHER): Payer: Private Health Insurance - Indemnity | Admitting: Family Medicine

## 2011-03-03 ENCOUNTER — Encounter: Payer: Self-pay | Admitting: Family Medicine

## 2011-03-03 VITALS — BP 102/70 | HR 76 | Temp 98.3°F | Ht 64.0 in | Wt 183.0 lb

## 2011-03-03 DIAGNOSIS — J029 Acute pharyngitis, unspecified: Secondary | ICD-10-CM

## 2011-03-03 DIAGNOSIS — R070 Pain in throat: Secondary | ICD-10-CM

## 2011-03-03 NOTE — Progress Notes (Signed)
Patient presents with complaint of pain in her throat.  Hurts in her throat just breathing air in.  Hurts with swallowing.  Hurts worse at night.  This has been ongoing since her last visit.  She was seen earlier this month and diagnosed with bronchitis.  She was treated with Tessalon and Medrol Dosepak.  The cough resolved, but ongoing throat pain.  Throat symptoms never improved.  Not getting worse, but not improving.  Denies any dysphagia.  Denies runny nose, sneezing, post-nasal drip.  Had stopped taking Dexilant because she hasn't been having any heartburn or indigestion.  Hasn't had recurrent reflux symptoms since stopping the medication.  She denies any abdominal pain, but continues to have some bilateral pain below her ribs.  She previously was treated for costochondritis--her symptoms are nowhere near as severe as they previously were.  Pain is more prominent on the left side, rather than the right (preivously had more R sided symptoms).   Denies weight changes.  Has noted some recent hair loss.  Denies temperature intolerance; periods are normal and regular; +fatigue.  Denies skin changes.  Past Medical History  Diagnosis Date  . GERD (gastroesophageal reflux disease)   . Hypertension   . Fibroid uterus     GYN--Dr. Estanislado Pandy    Past Surgical History  Procedure Date  . Fibroid excision     Dr. Estanislado Pandy     History   Social History  . Marital Status: Married    Spouse Name: N/A    Number of Children: N/A  . 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 Smokeless tobacco: Never Used  . Alcohol Use: Yes     1-2 drinks, once month  . Drug Use: No  . Sexually Active: Not on file   Other Topics Concern  . Not on file   Social History Narrative  . No narrative on file    Family History  Problem Relation Age of Onset  . Hypertension Mother   . Hypertension Father   .  Hypertension Sister   . Thyroid nodules Sister   . Diabetes Maternal Grandmother   . Cancer Maternal Grandmother     breast    Current outpatient prescriptions:ibuprofen (ADVIL,MOTRIN) 800 MG tablet, Take 1 tablet (800 mg total) by mouth every 8 (eight) hours as needed for pain (take with food)., Disp: 30 tablet, Rfl: ;  valsartan-hydrochlorothiazide (DIOVAN-HCT) 160-12.5 MG per tablet, Take 1 tablet by mouth daily.  , Disp: , Rfl: ;  dexlansoprazole (DEXILANT) 60 MG capsule, Take 1 capsule (60 mg total) by mouth daily as needed., Disp: 30 capsule, Rfl: 0  Allergies  Allergen Reactions  . Penicillins Shortness Of Breath and Swelling   ROS:  Denies fevers, cough, SOB.  See HPI  PHYSICAL EXAM: BP 102/70  Pulse 76  Temp(Src) 98.3 F (36.8 C) (Oral)  Ht 5\' 4"  (1.626 m)  Wt 183 lb (83.008 kg)  BMI 31.41 kg/m2  LMP 02/10/2011 Well developed, pleasant female in no distress HEENT: PERRL, EOMI. Conjunctiva clear.  Perhaps very mild proptosis (some white visible below her iris) Nasal mucosa--mildly edematous, clear mucus, no purulence or erythema. Sinuses nontender. OP normal, no erythema or lesions Neck: no lymphadenopathy.  She is tender anteriorly over her thyroid, borderline in size Heart: regular rate and rhythm without murmur Lungs: clear bilaterally Abdomen: nontender  ASSESSMENT/PLAN: 1. Sore throat  POCT rapid strep A  2. Throat pain  TSH, CBC with Differential   Throat pain-- Physical exam suggests possibility of postnasal drip (due to inflammation and mucus in nose); and history could suggest GERD contribution.  However, she is tender anteriorly, over her thyroid, so must r/o thyroid etiology/inflammation/thyroiditis.  Will check TSH today, as well as a CBC If labs are normal, recommend trial of claritin and Dexilant together.  If ongoing throat/neck pain, will refer to ENT for further evaluation

## 2011-03-03 NOTE — Patient Instructions (Signed)
We are checking thyroid tests today.  If they are abnormal, you will hear from [place] about next step.  If they are normal, then we must consider that either reflux or postnasal drip is contributing to your throat pain.  If that is the case, I recommend a trial of Claritin or Zyrtec, along with re-starting your Dexilant, and using both for about 2 weeks.  F/u in 2 weeks if not better, and we will likely refer you to an ENT

## 2011-03-04 LAB — CBC WITH DIFFERENTIAL/PLATELET
Eosinophils Relative: 2 % (ref 0–5)
HCT: 35.4 % — ABNORMAL LOW (ref 36.0–46.0)
Lymphocytes Relative: 28 % (ref 12–46)
Lymphs Abs: 2.1 10*3/uL (ref 0.7–4.0)
MCH: 26.1 pg (ref 26.0–34.0)
MCV: 83.1 fL (ref 78.0–100.0)
Monocytes Absolute: 0.6 10*3/uL (ref 0.1–1.0)
RBC: 4.26 MIL/uL (ref 3.87–5.11)
WBC: 7.3 10*3/uL (ref 4.0–10.5)

## 2011-03-04 LAB — FERRITIN: Ferritin: 73 ng/mL (ref 10–291)

## 2011-03-04 NOTE — Progress Notes (Signed)
Jovanka in lab will add iron and ferritin dx.285.9

## 2011-03-05 ENCOUNTER — Telehealth: Payer: Self-pay | Admitting: *Deleted

## 2011-03-05 NOTE — Telephone Encounter (Addendum)
Message copied by Dorthula Perfect on Fri Mar 05, 2011 12:11 PM ------      Message from: KNAPP, EVE      Created: Thu Mar 04, 2011  4:56 PM       Advise patient--Thyroid test was normal.  White count was normal, no indication of infection.  She had a mild anemia, and iron level was a little low. She may benefit from taking an iron supplement once daily, or taking a multivitamin that contains iron daily.  In addition to taking the dexilant (for reflux) and antihistamine for post-nasal drip/allergies, she can take an anti-inflammatory such as aleve or advil and/or tylenol for any pain.  Follow-up in 1-2 weeks if not improving, sooner if getting much worse.   Pt notified of lab results.  Will follow up in 1-2 weeks if not improving.  CM, LPN

## 2011-04-05 ENCOUNTER — Encounter: Payer: Self-pay | Admitting: Family Medicine

## 2011-04-05 ENCOUNTER — Ambulatory Visit (INDEPENDENT_AMBULATORY_CARE_PROVIDER_SITE_OTHER): Payer: Private Health Insurance - Indemnity | Admitting: Family Medicine

## 2011-04-05 VITALS — BP 118/70 | HR 68 | Temp 98.3°F | Ht 64.0 in | Wt 184.0 lb

## 2011-04-05 DIAGNOSIS — J069 Acute upper respiratory infection, unspecified: Secondary | ICD-10-CM

## 2011-04-05 DIAGNOSIS — I10 Essential (primary) hypertension: Secondary | ICD-10-CM

## 2011-04-05 NOTE — Patient Instructions (Signed)
Decongestants (ie sudafed) and Mucinex DM are recommended to help with your cough. Expect it to take another 5-7 days before you start feeling better.  Follow up next week if you are worse--especially if fevers, discolored mucus, shortness of breath, or other new concerns.  You may need to increase your ibuprofen up to three times daily if your chest pain gets worse from the coughing.

## 2011-04-05 NOTE — Progress Notes (Signed)
Patient presents with cough x 3-4 days, and is keeping her up at night.  Cough is nonproductive.  Denies allergy symptoms, sinus congestion, runny nose or sore throat.  Cough is worse if she gets hot, laughs, and more frequent at night.  Sometimes just breathing in makes her cough.  Denies sick contacts at home (+exposures through her job).  Has been taking Alka Selzer Plus cold and cough, with some temporary improvement.  Past Medical History  Diagnosis Date  . GERD (gastroesophageal reflux disease)   . Hypertension   . Fibroid uterus     GYN--Dr. Estanislado Pandy    Past Surgical History  Procedure Date  . Fibroid excision     Dr. Estanislado Pandy     History   Social History  . Marital Status: Married    Spouse Name: N/A    Number of Children: N/A  . 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 Smokeless tobacco: Never Used  . Alcohol Use: Yes     1-2 drinks, once month  . Drug Use: No  . Sexually Active: Not on file   Other Topics Concern  . Not on file   Social History Narrative  . No narrative on file    Family History  Problem Relation Age of Onset  . Hypertension Mother   . Hypertension Father   . Hypertension Sister   . Thyroid nodules Sister   . Diabetes Maternal Grandmother   . Cancer Maternal Grandmother     breast    Current outpatient prescriptions:dexlansoprazole (DEXILANT) 60 MG capsule, Take 1 capsule (60 mg total) by mouth daily as needed., Disp: 30 capsule, Rfl: 0;  ibuprofen (ADVIL,MOTRIN) 800 MG tablet, Take 1 tablet (800 mg total) by mouth every 8 (eight) hours as needed for pain (take with food)., Disp: 30 tablet, Rfl: ;  valsartan-hydrochlorothiazide (DIOVAN-HCT) 160-12.5 MG per tablet, Take 1 tablet by mouth daily.  , Disp: , Rfl:   Allergies  Allergen Reactions  . Penicillins Shortness Of Breath and Swelling   ROS: Denies fevers, URI symptoms other  than cough, nausea, vomiting, diarrhea, urinary problems (is having some stress incontinence with cough).  No rashes, joint pains or other complaints.  Mild chest discomfort/pain persists since last visit (costochondritis)--not yet worse from her coughing  PHYSICAL EXAM: BP 118/70  Pulse 68  Temp(Src) 98.3 F (36.8 C) (Oral)  Ht 5\' 4"  (1.626 m)  Wt 184 lb (83.462 kg)  BMI 31.58 kg/m2  LMP 03/10/2011 Well developed, pleasant female, with rare dry cough. No cough with speaking, laughing or forced expiration during exam HEENT: PERRL, EOMI, conjunctiva clear.  TM's and EAC's normal.  OP normal, without erythema.  Nasal mucosa is moderately edematous with clear-white mucus. No erythema, no sinus tenderness Neck: no lymphadenopathy or thyromegaly or mass Heart: regular rate and rhythm without murmur Lungs: clear bilaterally.  No wheezes or cough with forced expiration.  Good air movement throughout Extremities: no edema Skin: no rash  ASSESSMENT/PLAN: 1. URI (upper respiratory infection)    Supportive measures recommended.  Discussed expectorants (guaifenesin), cough suppressant (dextromethorphan), and careful use of decongestants (monitor BP; fine today)  Follow up in a week if symptoms persist, worsen

## 2011-04-14 ENCOUNTER — Ambulatory Visit (INDEPENDENT_AMBULATORY_CARE_PROVIDER_SITE_OTHER): Payer: Private Health Insurance - Indemnity | Admitting: Medical

## 2011-04-14 ENCOUNTER — Encounter: Payer: Self-pay | Admitting: Medical

## 2011-04-14 VITALS — BP 130/90 | HR 60 | Temp 98.6°F | Resp 16 | Ht 64.0 in | Wt 185.0 lb

## 2011-04-14 DIAGNOSIS — R05 Cough: Secondary | ICD-10-CM

## 2011-04-14 LAB — CBC WITH DIFFERENTIAL/PLATELET
Basophils Absolute: 0 10*3/uL (ref 0.0–0.1)
Basophils Relative: 0 % (ref 0–1)
Eosinophils Absolute: 0.1 10*3/uL (ref 0.0–0.7)
Eosinophils Relative: 1 % (ref 0–5)
HCT: 34 % — ABNORMAL LOW (ref 36.0–46.0)
Hemoglobin: 11 g/dL — ABNORMAL LOW (ref 12.0–15.0)
MCH: 26.1 pg (ref 26.0–34.0)
MCHC: 32.4 g/dL (ref 30.0–36.0)
MCV: 80.8 fL (ref 78.0–100.0)
Monocytes Absolute: 0.6 10*3/uL (ref 0.1–1.0)
Monocytes Relative: 8 % (ref 3–12)
RDW: 15.1 % (ref 11.5–15.5)

## 2011-04-14 MED ORDER — CLARITHROMYCIN ER 500 MG PO TB24: 1000.0000 mg | ORAL_TABLET | Freq: Every day | ORAL | Status: DC

## 2011-04-14 MED ORDER — BENZONATATE 100 MG PO CAPS: 100.0000 mg | ORAL_CAPSULE | Freq: Four times a day (QID) | ORAL | Status: DC | PRN

## 2011-04-14 NOTE — Progress Notes (Signed)
Here for recheck on cough.  She was seen here 04/05/11 with Dr. Lynelle Doctor for cough that is keeping her up at night.  She used OTC Mucinex DM as advised, but cough continues.  Her employer made her come in today due to bad deep cough all day at work that is persistent.   She notes frequent cough, day and night, night sweats, but otherwise doesn't feel sick.  Cough is nonproductive.  Denies allergy symptoms, sinus congestion, runny nose or sore throat.  Cough is worse if she gets hot, laughs, and more frequent at night.  Sometimes just breathing in makes her cough.  Denies hx/o DVT/PE, no recent long travel, no bed ridden status.  She does have hx/o GERD but says that hasn't been acting up.  Denies allergy problems.  Denies exposure to homeless, nursing home patient, no recent hospital visit. She has had some sick contacts at work.  She notes negative PPD testing about a month ago.   Past Medical History  Diagnosis Date  . GERD (gastroesophageal reflux disease)   . Hypertension   . Fibroid uterus     GYN--Dr. Estanislado Pandy    Past Surgical History  Procedure Date  . Fibroid excision     Dr. Estanislado Pandy     History   Social History  . Marital Status: Married    Spouse Name: N/A    Number of Children: N/A  . 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 Smokeless tobacco: Never Used  . Alcohol Use: Yes     1-2 drinks, once month  . Drug Use: No  . Sexually Active: Not on file   Other Topics Concern  . Not on file   Social History Narrative  . No narrative on file    Family History  Problem Relation Age of Onset  . Hypertension Mother   . Hypertension Father   . Hypertension Sister   . Thyroid nodules Sister   . Diabetes Maternal Grandmother   . Cancer Maternal Grandmother     breast    Current outpatient prescriptions:dexlansoprazole (DEXILANT) 60 MG capsule, Take 1 capsule (60 mg  total) by mouth daily as needed., Disp: 30 capsule, Rfl: 0;  ibuprofen (ADVIL,MOTRIN) 800 MG tablet, Take 1 tablet (800 mg total) by mouth every 8 (eight) hours as needed for pain (take with food)., Disp: 30 tablet, Rfl: ;  valsartan-hydrochlorothiazide (DIOVAN-HCT) 160-12.5 MG per tablet, Take 1 tablet by mouth daily.  , Disp: , Rfl:   Allergies  Allergen Reactions  . Penicillins Shortness Of Breath and Swelling   ROS: Denies fevers, URI symptoms other than cough, nausea, vomiting, diarrhea, urinary problems.  No rashes, joint pains or other complaints.  Mild chest discomfort/pain persists since last visit (costochondritis)--not yet worse from her coughing  PHYSICAL EXAM: BP 130/90  Pulse 60  Temp(Src) 98.6 F (37 C) (Oral)  Resp 16  Ht 5\' 4"  (1.626 m)  Wt 185 lb (83.915 kg)  BMI 31.76 kg/m2  LMP 04/11/2011 Well developed, pleasant female, at times with deep prolonged cough, somewhat seal like sounding HEENT: PERRL, EOMI, conjunctiva clear.  TM's and EAC's normal.  OP normal, without erythema.  Nasal mucosa is moderately edematous with clear-white mucus. No erythema, no sinus tenderness Neck: no lymphadenopathy or thyromegaly or mass Heart: regular rate and rhythm without murmur Lungs: clear bilaterally.  No wheezes or cough  with forced expiration.  Good air movement throughout Extremities: no edema Skin: no rash  ASSESSMENT/PLAN: Encounter Diagnosis  Name Primary?  . Cough Yes   CXR today with pachy congestion in hilar region, but otherwise negative.  Will send CXR for over read.  PPD placed.  Will treat for bronchitis.  Prescribed Biaxin, Tessalon Perles for cough, gave note for work.  Recheck 48-72 hours for PPD reading.

## 2011-04-19 ENCOUNTER — Telehealth: Payer: Self-pay | Admitting: Medical

## 2011-04-20 ENCOUNTER — Other Ambulatory Visit: Payer: Self-pay | Admitting: Medical

## 2011-04-20 DIAGNOSIS — R05 Cough: Secondary | ICD-10-CM

## 2011-04-20 MED ORDER — RANITIDINE HCL 150 MG PO TABS: 150.0000 mg | ORAL_TABLET | Freq: Two times a day (BID) | ORAL | Status: DC

## 2011-04-20 MED ORDER — METHYLPREDNISOLONE 4 MG PO KIT: PACK | ORAL | Status: AC

## 2011-04-20 MED ORDER — RANITIDINE HCL 150 MG PO TABS: 150.0000 mg | ORAL_TABLET | Freq: Every day | ORAL | Status: DC

## 2011-04-20 NOTE — Telephone Encounter (Signed)
done

## 2011-04-23 ENCOUNTER — Other Ambulatory Visit: Payer: Self-pay | Admitting: Obstetrics and Gynecology

## 2011-04-23 DIAGNOSIS — Z1231 Encounter for screening mammogram for malignant neoplasm of breast: Secondary | ICD-10-CM

## 2011-05-09 ENCOUNTER — Encounter (HOSPITAL_COMMUNITY): Payer: Self-pay

## 2011-05-10 ENCOUNTER — Ambulatory Visit
Admission: RE | Admit: 2011-05-10 | Discharge: 2011-05-10 | Disposition: A | Discharge status: Home / Self Care | Payer: Private Health Insurance - Indemnity | Source: Ambulatory Visit | Attending: Obstetrics and Gynecology | Admitting: Obstetrics and Gynecology

## 2011-05-10 ENCOUNTER — Other Ambulatory Visit: Payer: Self-pay | Admitting: Obstetrics and Gynecology

## 2011-05-10 DIAGNOSIS — Z1231 Encounter for screening mammogram for malignant neoplasm of breast: Secondary | ICD-10-CM

## 2011-05-11 ENCOUNTER — Telehealth: Payer: Self-pay | Admitting: Family Medicine

## 2011-05-11 ENCOUNTER — Encounter: Payer: Self-pay | Admitting: Family Medicine

## 2011-05-11 NOTE — Telephone Encounter (Signed)
DONE

## 2011-05-11 NOTE — Telephone Encounter (Signed)
Do we have prior gynecology records - ultrasound, notes, etc?  If not, I would needs these first.  She or we can call to request.  Fibroids are common, but if she is second guessing surgery planned for this Friday, then she should probably call the gynecologist first about her fears/concerns for additional consult before going ahead with surgery.  I can certainly review the records once I get them and see her to discuss, but I can't guarantee all this can happen before Friday.  Also, there are a few other gynecologist I refer to regularly that could potentially provide a second opinion.

## 2011-05-11 NOTE — Telephone Encounter (Signed)
I spoke with the patient and and i explained to her that we needed her GYN OV notes so that Vincenza Hews could review those in order for him to refer to another doctor for a second opinion. She set an appointment to see Vincenza Hews on 05/12/2011 and she will have her GYN OV notes with her. CLS

## 2011-05-12 ENCOUNTER — Other Ambulatory Visit: Payer: Private Health Insurance - Indemnity | Admitting: Medical

## 2011-05-13 ENCOUNTER — Encounter (HOSPITAL_COMMUNITY)
Admission: RE | Admit: 2011-05-13 | Discharge: 2011-05-13 | Disposition: A | Discharge status: Home / Self Care | Payer: Managed Care, Other (non HMO) | Source: Ambulatory Visit | Attending: Obstetrics and Gynecology | Admitting: Obstetrics and Gynecology

## 2011-05-13 ENCOUNTER — Encounter (HOSPITAL_COMMUNITY): Payer: Self-pay

## 2011-05-13 HISTORY — DX: Bronchitis, not specified as acute or chronic: J40

## 2011-05-13 HISTORY — DX: Anemia, unspecified: D64.9

## 2011-05-13 HISTORY — DX: Acute upper respiratory infection, unspecified: J06.9

## 2011-05-13 HISTORY — DX: Cough: R05

## 2011-05-13 HISTORY — DX: Dorsalgia, unspecified: M54.9

## 2011-05-13 HISTORY — DX: Cough, unspecified: R05.9

## 2011-05-13 LAB — CBC
HCT: 35.3 % — ABNORMAL LOW (ref 36.0–46.0)
Hemoglobin: 11.2 g/dL — ABNORMAL LOW (ref 12.0–15.0)
MCH: 26.7 pg (ref 26.0–34.0)
MCHC: 31.7 g/dL (ref 30.0–36.0)
MCV: 84.2 fL (ref 78.0–100.0)
Platelets: 395 10*3/uL (ref 150–400)
RBC: 4.19 MIL/uL (ref 3.87–5.11)
RDW: 14.7 % (ref 11.5–15.5)
WBC: 8 10*3/uL (ref 4.0–10.5)

## 2011-05-13 MED ORDER — CIPROFLOXACIN IN D5W 400 MG/200ML IV SOLN
400.0000 mg | INTRAVENOUS | Status: DC
  Filled 2011-05-13: qty 200

## 2011-05-13 MED ORDER — CLINDAMYCIN PHOSPHATE 900 MG/50ML IV SOLN
900.0000 mg | INTRAVENOUS | Status: DC
  Filled 2011-05-13: qty 50

## 2011-05-13 NOTE — Patient Instructions (Signed)
   Your procedure is scheduled on: Friday, Nov 9th  Enter through the [employer] [facility] at: 7:15am Pick up the phone at the desk and dial [phone] and inform us of your arrival.  Please call this number if you have any problems the morning of surgery: [phone]  Remember: Do not eat food after midnight:  Thursday Do not drink clear liquids after:  Thursday Take these medicines the morning of surgery with a SIP OF WATER:  Dexilant and Diovan/HCTZ  Do not wear jewelry, make-up, or FINGER nail polish Do not wear lotions, powders, or perfumes.  You may not  wear deodorant. Do not shave 48 hours prior to surgery. Do not bring valuables to the hospital.  Patients discharged on the day of surgery will not be allowed to drive home.     Remember to use your hibiclens as instructed.Please shower with 1/2 bottle the evening before your surgery and the other 1/2 bottle the morning of surgery.

## 2011-05-13 NOTE — Anesthesia Preprocedure Evaluation (Addendum)
Anesthesia Evaluation  Patient identified by MRN, date of birth, ID band Patient awake    Reviewed: Allergy & Precautions, H&P , Patient's Chart, lab work & pertinent test results  Airway Mallampati: I TM Distance: >3 FB Neck ROM: Full    Dental No notable dental hx. (+) Teeth Intact   Pulmonary Recent URI ,  Chronic cough for last 6 mos. Has been on numerous antibiotics, cough suppressants without improvement. CXR shows some hilar congestion.  clear to auscultation  Pulmonary exam normal       Cardiovascular hypertension, Pt. on medications Regular Normal    Neuro/Psych Negative Neurological ROS  Negative Psych ROS   GI/Hepatic Neg liver ROS, GERD-  Controlled and Medicated,  Endo/Other  Negative Endocrine ROS  Renal/GU negative Renal ROS  Genitourinary negative   Musculoskeletal negative musculoskeletal ROS (+)   Abdominal   Peds  Hematology negative hematology ROS (+)   Anesthesia Other Findings scoliosis  Reproductive/Obstetrics negative OB ROS                          Anesthesia Physical Anesthesia Plan  ASA: II  Anesthesia Plan: Spinal   Post-op Pain Management:    Induction:   Airway Management Planned:   Additional Equipment:   Intra-op Plan:   Post-operative Plan:   Informed Consent:   Plan Discussed with: CRNA, Anesthesiologist and Surgeon  Anesthesia Plan Comments:         Anesthesia Quick Evaluation

## 2011-05-13 NOTE — H&P (Signed)
NAME:  Michelle Mack, Michelle Mack             ACCOUNT NO.:  [id number]  MEDICAL RECORD NO.:  [id number]  LOCATION:  [facility]                           FACILITY:  [facility]  PHYSICIAN:  Naydene Kamrowski A. Cambelle Suchecki, M.D. DATE OF BIRTH:  09/18/1965  DATE OF ADMISSION:  05/13/2011 DATE OF DISCHARGE:                             HISTORY & PHYSICAL   REASON FOR ADMISSION:  Dysfunctional uterine bleeding with large intrauterine endometrial polyp.  HPI:  This is a case of 45-year-old married African American female who presents to the office on May 06, 2011, complaining of an abnormal bleeding episode starting at the beginning of October 2012.  Up until October, her cycle has remained monthly with a 7-day bleeding episode in a normal flow.  Starting April 05, 2011, she now reports 3 episodes of bleeding of variable flow, but associated with severe dysmenorrhea, reaching a 7/10.  Dysmenorrhea can be relieved with p.r.n. use of ibuprofen 800 mg.  In May of 2012, an annual exam revealed the presence of an endometrial polyp which was at that time, asymptomatic.  Due to previous history, a hysteroscopy with polypectomy was recommended which patient declined at that time, but was instructed to call back, is experiencing abnormal bleeding.  REVIEW OF SYSTEMS:  Negative.  PAST SURGERY HISTORY:  Myomectomy with resection of adenomyosis in June of 2002, infertility since 1997.  Gravida 5, para 1, with 1 spontaneous vaginal delivery.  ALLERGY:  PENICILLIN, AMOXICILLIN, and CEPHALOSPORIN.  No latex allergy.  CURRENT MEDICATIONS:  Ibuprofen 800 mg as needed.  SOCIAL HISTORY:  Married, nonsmoker, is an office manager.  FAMILY HISTORY:  Maternal grandmother with breast cancer.  PHYSICAL EXAMINATION:  VITAL SIGNS:  Current weight is 182 pounds for a height of 5 feet 4 inches, blood pressure 110/72, pulse 76. HEAD EYES EARS, NOSE, AND THROAT:  Negative. CARDIOPULMONARY:  With regular rate and rhythm.  Normal breath  sounds. SKIN:  Normal. ABDOMEN:  No masses.  No tenderness.  No hepatosplenomegaly. MUSCULOSKELETAL:  Normal. NEUROLOGICAL:  Normal. GYN:  Reveals a normal external genitalia,  normal vagina, normal cervix, although upon mobilization of the cervix, there is a large evacuation of bloody discharge.  No cervical polyp is identified. Uterus is increased in size, approximately 14 weeks, which was already known adnexa are not felt.  An ultrasound performed that day revealed a uterus measuring 10 cm x 7.3 cm x 2.3 cm.  Left ovary is normal.  Right ovary is normal.  The uterus is anteflexed and there is the presence of mass central centrally located in the endometrial cavity.  This mass is positive for blood flow and measures 5.3 x 4.9 x 5 cm.  This could represent a large polyp or a submucosal fibroid.  Due to the mass, endometrium is poorly evaluated.  ASSESSMENT:  New onset of dysfunctional uterine bleeding in a patient with endometrial mass.  It was recommended to the patient to proceed with hysteroscopy and resection of endometrial mass or submucosal fibroid.  The procedure was thoroughly reviewed with the patient, including possible complications such as bleeding, infection, uterine perforation with subsequent intra-abdominal organ injury.  The patient voiced understanding the risks and benefits, and decides to proceed.       Farhad Burleson A. Elishua Radford, M.D.     SAR/MEDQ  D:  05/13/2011  T:  05/13/2011  Job:  [id number] 

## 2011-05-13 NOTE — Pre-Procedure Instructions (Signed)
Dr Malen Gauze saw this patient at pre-op visit.

## 2011-05-14 ENCOUNTER — Ambulatory Visit (HOSPITAL_COMMUNITY)
Admission: RE | Admit: 2011-05-14 | Discharge: 2011-05-14 | Disposition: A | Discharge status: Home / Self Care | Payer: Managed Care, Other (non HMO) | Source: Ambulatory Visit | Attending: Obstetrics and Gynecology | Admitting: Obstetrics and Gynecology

## 2011-05-14 ENCOUNTER — Ambulatory Visit (HOSPITAL_COMMUNITY): Payer: Managed Care, Other (non HMO) | Admitting: Anesthesiology

## 2011-05-14 ENCOUNTER — Encounter (HOSPITAL_COMMUNITY): Payer: Self-pay | Admitting: Anesthesiology

## 2011-05-14 ENCOUNTER — Encounter (HOSPITAL_COMMUNITY)
Admission: RE | Disposition: A | Discharge status: Home / Self Care | Payer: Self-pay | Source: Ambulatory Visit | Attending: Obstetrics and Gynecology

## 2011-05-14 ENCOUNTER — Other Ambulatory Visit: Payer: Self-pay | Admitting: Obstetrics and Gynecology

## 2011-05-14 DIAGNOSIS — N938 Other specified abnormal uterine and vaginal bleeding: Secondary | ICD-10-CM | POA: Insufficient documentation

## 2011-05-14 DIAGNOSIS — N949 Unspecified condition associated with female genital organs and menstrual cycle: Secondary | ICD-10-CM | POA: Insufficient documentation

## 2011-05-14 DIAGNOSIS — Z01812 Encounter for preprocedural laboratory examination: Secondary | ICD-10-CM | POA: Insufficient documentation

## 2011-05-14 DIAGNOSIS — N946 Dysmenorrhea, unspecified: Secondary | ICD-10-CM | POA: Insufficient documentation

## 2011-05-14 DIAGNOSIS — N840 Polyp of corpus uteri: Secondary | ICD-10-CM | POA: Insufficient documentation

## 2011-05-14 DIAGNOSIS — Z01818 Encounter for other preprocedural examination: Secondary | ICD-10-CM | POA: Insufficient documentation

## 2011-05-14 LAB — PREGNANCY, URINE: Preg Test, Ur: NEGATIVE

## 2011-05-14 SURGERY — DILATATION & CURETTAGE/HYSTEROSCOPY WITH VERSAPOINT RESECTION
Anesthesia: Choice

## 2011-05-14 MED ORDER — PROPOFOL 10 MG/ML IV EMUL
INTRAVENOUS | Status: AC
Start: 2011-05-14 — End: 2011-05-14
  Filled 2011-05-14: qty 20

## 2011-05-14 MED ORDER — MIDAZOLAM HCL 2 MG/2ML IJ SOLN
INTRAMUSCULAR | Status: AC
  Administered 2011-05-14: 2 mg
  Filled 2011-05-14: qty 2

## 2011-05-14 MED ORDER — PROPOFOL 10 MG/ML IV EMUL
INTRAVENOUS | Status: DC | PRN
  Administered 2011-05-14: 75 ug/kg/min via INTRAVENOUS

## 2011-05-14 MED ORDER — LACTATED RINGERS IV SOLN: INTRAVENOUS | Status: DC | PRN

## 2011-05-14 MED ORDER — ONDANSETRON HCL 4 MG/2ML IJ SOLN
INTRAMUSCULAR | Status: AC
  Filled 2011-05-14: qty 2

## 2011-05-14 MED ORDER — CHLOROPROCAINE HCL 1 % IJ SOLN
INTRAMUSCULAR | Status: DC | PRN
  Administered 2011-05-14: 18 mL

## 2011-05-14 MED ORDER — LIDOCAINE HCL (CARDIAC) 20 MG/ML IV SOLN
INTRAVENOUS | Status: AC
  Filled 2011-05-14: qty 5

## 2011-05-14 MED ORDER — FENTANYL CITRATE 0.05 MG/ML IJ SOLN
INTRAMUSCULAR | Status: AC
  Filled 2011-05-14: qty 2

## 2011-05-14 MED ORDER — LIDOCAINE HCL (CARDIAC) 20 MG/ML IV SOLN
INTRAVENOUS | Status: DC | PRN
  Administered 2011-05-14: 20 mg via INTRAVENOUS

## 2011-05-14 MED ORDER — MIDAZOLAM HCL 2 MG/2ML IJ SOLN
INTRAMUSCULAR | Status: AC
  Filled 2011-05-14: qty 2

## 2011-05-14 MED ORDER — FAMOTIDINE 20 MG PO TABS: 20.0000 mg | ORAL_TABLET | Freq: Once | ORAL | Status: DC

## 2011-05-14 MED ORDER — SODIUM CHLORIDE 0.9 % IR SOLN
Status: DC | PRN
  Administered 2011-05-14: 1

## 2011-05-14 MED ORDER — METOCLOPRAMIDE HCL 10 MG PO TABS: 10.0000 mg | ORAL_TABLET | Freq: Once | ORAL | Status: DC

## 2011-05-14 MED ORDER — MIDAZOLAM HCL 2 MG/2ML IJ SOLN: 2.0000 mg | Freq: Once | INTRAMUSCULAR | Status: DC

## 2011-05-14 MED ORDER — ONDANSETRON HCL 4 MG/2ML IJ SOLN
INTRAMUSCULAR | Status: DC | PRN
  Administered 2011-05-14: 4 mg via INTRAVENOUS

## 2011-05-14 MED ORDER — SCOPOLAMINE 1 MG/3DAYS TD PT72: 1.0000 | MEDICATED_PATCH | Freq: Once | TRANSDERMAL | Status: DC

## 2011-05-14 MED ORDER — FENTANYL CITRATE 0.05 MG/ML IJ SOLN
25.0000 ug | INTRAMUSCULAR | Status: DC | PRN
  Administered 2011-05-14 (×2): 50 ug via INTRAVENOUS

## 2011-05-14 MED ORDER — FENTANYL CITRATE 0.05 MG/ML IJ SOLN
INTRAMUSCULAR | Status: AC
  Administered 2011-05-14: 50 ug via INTRAVENOUS
  Filled 2011-05-14: qty 2

## 2011-05-14 MED ORDER — LACTATED RINGERS IV SOLN
INTRAVENOUS | Status: DC
  Administered 2011-05-14 (×4): via INTRAVENOUS

## 2011-05-14 MED ORDER — CLINDAMYCIN PHOSPHATE 600 MG/50ML IV SOLN
INTRAVENOUS | Status: DC | PRN
  Administered 2011-05-14: 900 mg via INTRAVENOUS

## 2011-05-14 MED ORDER — CIPROFLOXACIN IN D5W 400 MG/200ML IV SOLN
INTRAVENOUS | Status: DC | PRN
  Administered 2011-05-14: 400 mg via INTRAVENOUS

## 2011-05-14 MED ORDER — CITRIC ACID-SODIUM CITRATE 334-500 MG/5ML PO SOLN: 30.0000 mL | Freq: Once | ORAL | Status: DC

## 2011-05-14 MED ORDER — PANTOPRAZOLE SODIUM 40 MG PO TBEC: 40.0000 mg | DELAYED_RELEASE_TABLET | Freq: Once | ORAL | Status: DC

## 2011-05-14 MED ORDER — DIAZEPAM 5 MG/ML IJ SOLN: 5.0000 mg | Freq: Once | INTRAMUSCULAR | Status: DC

## 2011-05-14 SURGICAL SUPPLY — 22 items
BOOTIES KNEE HIGH SLOAN (MISCELLANEOUS) ×4 IMPLANT
CANISTER SUCTION 2500CC (MISCELLANEOUS) ×2 IMPLANT
CATH ROBINSON RED A/P 16FR (CATHETERS) ×2 IMPLANT
CLOTH BEACON ORANGE TIMEOUT ST (SAFETY) ×2 IMPLANT
CONTAINER PREFILL 10% NBF 60ML (FORM) ×4 IMPLANT
CORD ACTIVE DISPOSABLE (ELECTRODE)
CORD ELECTRO ACTIVE DISP (ELECTRODE) IMPLANT
DRAPE UTILITY XL STRL (DRAPES) ×4 IMPLANT
ELECT LOOP GYNE PRO 24FR (CUTTING LOOP)
ELECT REM PT RETURN 9FT ADLT (ELECTROSURGICAL) ×2
ELECT VAPORTRODE GRVD BAR (ELECTRODE) IMPLANT
ELECTRODE LOOP GYNE PRO 24FR (CUTTING LOOP) IMPLANT
ELECTRODE REM PT RTRN 9FT ADLT (ELECTROSURGICAL) ×1 IMPLANT
ELECTRODE ROLLER VERSAPOINT (ELECTRODE) IMPLANT
ELECTRODE RT ANGLE VERSAPOINT (CUTTING LOOP) ×1 IMPLANT
GLOVE BIOGEL PI IND STRL 6.5 (GLOVE) ×2 IMPLANT
GLOVE BIOGEL PI INDICATOR 6.5 (GLOVE) ×2
GOWN PREVENTION PLUS LG XLONG (DISPOSABLE) ×2 IMPLANT
GOWN STRL REIN XL XLG (GOWN DISPOSABLE) ×2 IMPLANT
PACK HYSTEROSCOPY LF (CUSTOM PROCEDURE TRAY) ×2 IMPLANT
TOWEL OR 17X24 6PK STRL BLUE (TOWEL DISPOSABLE) ×2 IMPLANT
WATER STERILE IRR 1000ML POUR (IV SOLUTION) ×2 IMPLANT

## 2011-05-14 NOTE — Brief Op Note (Signed)
05/14/2011  10:47 AM  PATIENT:  Michelle Mack  45 y.o. female  PRE-OPERATIVE DIAGNOSIS:  Dysfunctional Uterine Bleeding; Intrauterine Mass  POST-OPERATIVE DIAGNOSIS:  Dysfunctional Uterine Bleeding; uterine synechia  PROCEDURE:  DILATATION & CURETTAGE/HYSTEROSCOPY WITH VERSAPOINT RESECTION  SURGEON:  Esmeralda Arthur, MD  ASSISTANTS: none   ANESTHESIA:   local and spinal  EBL:  Total I/O In: 2000 [I.V.:2000] Out: 75 [Urine:50; Blood:25]   LOCAL MEDICATIONS USED:   1% Nesacaine  SPECIMEN:  Endometrial polyps and curettings  DISPOSITION OF SPECIMEN:  PATHOLOGY  COUNTS:  YES   PLAN OF CARE: Discharge to home after PACU  PATIENT DISPOSITION:  PACU - hemodynamically stable.

## 2011-05-14 NOTE — Op Note (Signed)
Preop diagnosis: Dysfunctional uterine bleeding  Postop diagnosis: Dysfunctional uterine bleeding and uterine  synechia  Anesthesia: Spinal  Anesthesiologist: Dr. Cristela Blue  Procedure: Hysteroscopy, resection of endometrial polyps and curettage  Surgeon: Dr. Dois Davenport Javarie Crisp  Procedure: After being informed of the planned procedure with possible complications including bleeding, infection and uterine perforation, informed consent was obtained and patient was taken to or #8.  She was given spinal anesthesia without complication. She was placed in a dorsal decubitus position, prepped and draped in the sterile fashion and a Foley catheter was inserted in her bladder. Pelvic exam reveals normal external genitalia with an enlarged uterus approximately 14-16 weeks in size and 2 normal adnexa.  A weighted speculum is inserted in the vagina. The cervix was grasped with a tenaculum forcep placed on the anterior lip. Uterus is sounded at 13 cm. The cervix is then easily dilated using Hegar dilator until #33. This allows for easy placement of a diagnostic hysteroscope. With perfusion of normal saline at a maximum pressure of 80 mm of mercury, we are able to evaluate the entire uterine cavity.  Observation: We see a small endometrial polyp at the lower uterine segment on the right lateral wall measuring approximately 1 cm. We advanced further her to discover AT separated into by what appears to be a thick synechia measuring approximately 4.5 cm in width. We are able to move to the right and to the left of this area to visualize both tubal ostia. It is evaluated at the length of the synechia to the fundus is approximately 3 cm. At the top of the synechia we find a 1.5 cm polyp. The rest of the endometrium appears normal.  We change our diagnostic hysteroscope for a VersaPoint operative hysteroscope and proceeded with the resection of the 2 previously mentioned polyps. We then removed our instrumentation.  Using a sharp curette, we proceed with curettage of the endometrial cavity which returns a small amount of normal-appearing endometrium.  Instruments are then removed. Instrument and sponge count is complete x2. Estimated blood loss is minimal. Water deficit is 120 cc of normal saline.  The procedure is very well tolerated by the patient is taken to recovery room in a well and stable condition.  Specimen: Endometrial polyps and endometrial curettings sent to pathology.

## 2011-05-14 NOTE — Progress Notes (Signed)
12:45 pt co left foot itching and burning having some spasms pt has good pedal pulses. States it doesn't hurt just burning and itching. Dr rivard notified by [employer] . Dr Linton Rump cassidy notified orders dr Rodman Pickle comes to see patient . Pt received an spinal anesthesia for her procedure today. Orders received to give pt iv versed. 13:04 1mg  iv versed given.  Pt still awake alert no relief. Pt given another 1mg  iv versed at approx 13:11. . At 13:30 sensation has eased off some. Pt states she would like to try to get up she feels walking may help. Vital signs stable.

## 2011-05-14 NOTE — Anesthesia Procedure Notes (Signed)

## 2011-05-14 NOTE — H&P (View-Only) (Signed)
Michelle Mack, Michelle Mack             ACCOUNT NO.:  [id number]  MEDICAL RECORD NO.:  [id number]  LOCATION:  [facility]                           [facility]ACILITY:  [facility]  PHYSICIAN:  Dois Davenport A. Liliyana Thobe, M.D. DATE OF BIRTH:  1965-11-01  DATE OF ADMISSION:  05/13/2011 DATE OF DISCHARGE:                             HISTORY & PHYSICAL   REASON FOR ADMISSION:  Dysfunctional uterine bleeding with large intrauterine endometrial polyp.  HPI:  This is a case of 45 year old married [place] American female who presents to the office on May 06, 2011, complaining of an abnormal bleeding episode starting at the beginning of October 2012.  Up until October, her cycle has remained monthly with a 7-day bleeding episode in a normal flow.  Starting April 05, 2011, she now reports 3 episodes of bleeding of variable flow, but associated with severe dysmenorrhea, reaching a 7/10.  Dysmenorrhea can be relieved with p.r.n. use of ibuprofen 800 mg.  In May of 2012, an annual exam revealed the presence of an endometrial polyp which was at that time, asymptomatic.  Due to previous history, a hysteroscopy with polypectomy was recommended which patient declined at that time, but was instructed to call back, is experiencing abnormal bleeding.  REVIEW OF SYSTEMS:  Negative.  PAST SURGERY HISTORY:  Myomectomy with resection of adenomyosis in June of 2002, infertility since 1997.  Gravida 5, para 1, with 1 spontaneous vaginal delivery.  ALLERGY:  PENICILLIN, AMOXICILLIN, and CEPHALOSPORIN.  No latex allergy.  CURRENT MEDICATIONS:  Ibuprofen 800 mg as needed.  SOCIAL HISTORY:  Married, nonsmoker, is an Print production planner.  FAMILY HISTORY:  Maternal grandmother with breast cancer.  PHYSICAL EXAMINATION:  VITAL SIGNS:  Current weight is 182 pounds for a height of 5 feet 4 inches, blood pressure 110/72, pulse 76. HEAD EYES EARS, NOSE, AND THROAT:  Negative. CARDIOPULMONARY:  With regular rate and rhythm.  Normal breath  sounds. SKIN:  Normal. ABDOMEN:  No masses.  No tenderness.  No hepatosplenomegaly. MUSCULOSKELETAL:  Normal. NEUROLOGICAL:  Normal. GYN:  Reveals a normal external genitalia,  normal vagina, normal cervix, although upon mobilization of the cervix, there is a large evacuation of bloody discharge.  No cervical polyp is identified. Uterus is increased in size, approximately 14 weeks, which was already known adnexa are not felt.  An ultrasound performed that day revealed a uterus measuring 10 cm x 7.3 cm x 2.3 cm.  Left ovary is normal.  Right ovary is normal.  The uterus is anteflexed and there is the presence of mass central centrally located in the endometrial cavity.  This mass is positive for blood flow and measures 5.3 x 4.9 x 5 cm.  This could represent a large polyp or a submucosal fibroid.  Due to the mass, endometrium is poorly evaluated.  ASSESSMENT:  New onset of dysfunctional uterine bleeding in a patient with endometrial mass.  It was recommended to the patient to proceed with hysteroscopy and resection of endometrial mass or submucosal fibroid.  The procedure was thoroughly reviewed with the patient, including possible complications such as bleeding, infection, uterine perforation with subsequent intra-abdominal organ injury.  The patient voiced understanding the risks and benefits, and decides to proceed.  Michelle Mack, M.D.     SAR/MEDQ  D:  05/13/2011  T:  05/13/2011  Job:  [id number]

## 2011-05-14 NOTE — Progress Notes (Signed)
At approx 2:30 pt sat on side of bed pt states she can not walk she is unsteady on her feet. Pt served snack. Pt husband brought to pacu to sit with pt. approx 15:00 pt assisted to bathroom via wheelchair pt dressed . Pt states her left foot better. No more itching or burning in foot.  Pt walked with minimimal assistance t from restroom to phase 2. Pt sat up in recliner. 15:20 pt able to ambulate without assistance. Pt dc to home skin warm dry pink resp even and unlabored tolerates po fluids well dc via wc to car husband at side.

## 2011-05-14 NOTE — Transfer of Care (Signed)
Immediate Anesthesia Transfer of Care Note  Patient: Michelle Mack  Procedure(s) Performed:  DILATATION & CURETTAGE/HYSTEROSCOPY WITH VERSAPOINT RESECTION  Patient Location: PACU  Anesthesia Type: Spinal  Level of Consciousness: awake, alert , oriented and patient cooperative  Airway & Oxygen Therapy: Patient Spontanous Breathing and Patient connected to nasal cannula oxygen  Post-op Assessment: Report given to PACU RN and Post -op Vital signs reviewed and stable  Post vital signs: Reviewed and stable  Complications: No apparent anesthesia complications

## 2011-05-14 NOTE — Preoperative (Signed)
Beta Blockers   Reason not to administer Beta Blockers:Not Applicable 

## 2011-05-14 NOTE — Interval H&P Note (Signed)
History and Physical Interval Note:   05/14/2011   9:27 AM   Michelle Mack  has presented today for surgery, with the diagnosis of Dysfunctional Uterine Bleeding; Intrauterine Mass  The various methods of treatment have been discussed with the patient and family. After consideration of risks, benefits and other options for treatment, the patient has consented to  Procedure(s): DILATATION & CURETTAGE/HYSTEROSCOPY WITH VERSAPOINT RESECTION as a surgical intervention .  The patients' history has been reviewed, patient examined, no change in status, stable for surgery.  I have reviewed the patients' chart and labs.  Questions were answered to the patient's satisfaction.    Medication update: Diovan for HTN                                 Dexilant for GERD           Aygestin for DUB  Shahan Starks A  MD

## 2011-05-17 NOTE — Anesthesia Postprocedure Evaluation (Signed)
  Anesthesia Post-op Note  Patient: Michelle Mack  Procedure(s) Performed:  DILATATION & CURETTAGE/HYSTEROSCOPY WITH VERSAPOINT RESECTION  Patient is awake and responsive. Pain and nausea are reasonably well controlled. Vital signs are stable and clinically acceptable. Oxygen saturation is clinically acceptable. There are no apparent anesthetic complications at this time. Patient is ready for discharge.

## 2011-05-24 ENCOUNTER — Institutional Professional Consult (permissible substitution): Payer: Private Health Insurance - Indemnity | Admitting: Internal Medicine

## 2011-05-25 ENCOUNTER — Encounter: Payer: Self-pay | Admitting: Medical

## 2011-05-25 ENCOUNTER — Ambulatory Visit (INDEPENDENT_AMBULATORY_CARE_PROVIDER_SITE_OTHER): Payer: Managed Care, Other (non HMO) | Admitting: Medical

## 2011-05-25 VITALS — BP 110/68 | HR 68 | Temp 98.5°F | Resp 16 | Wt 181.0 lb

## 2011-05-25 DIAGNOSIS — K219 Gastro-esophageal reflux disease without esophagitis: Secondary | ICD-10-CM

## 2011-05-25 DIAGNOSIS — D649 Anemia, unspecified: Secondary | ICD-10-CM | POA: Insufficient documentation

## 2011-05-25 DIAGNOSIS — R053 Chronic cough: Secondary | ICD-10-CM | POA: Insufficient documentation

## 2011-05-25 DIAGNOSIS — R05 Cough: Secondary | ICD-10-CM

## 2011-05-25 LAB — TB SKIN TEST: Induration: 48

## 2011-05-25 MED ORDER — MOMETASONE FURO-FORMOTEROL FUM 100-5 MCG/ACT IN AERO: 2.0000 | INHALATION_SPRAY | Freq: Two times a day (BID) | RESPIRATORY_TRACT | Status: DC

## 2011-05-25 MED ORDER — FERROUS FUM-IRON POLYSACCH 162-115.2 MG PO CAPS: 1.0000 | ORAL_CAPSULE | Freq: Every day | ORAL | Status: DC

## 2011-05-25 NOTE — Progress Notes (Signed)
Subjective:   HPI  Michelle Mack is a 45 y.o. female who presents for f/u on cough.  She was seen here on 04/05/11 and 04/14/11 for the same c/o.  She notes cough ongoing x 8 months.  When I saw her on 04/14/11, her chest xray showed some bronchitis changes, but otherwise normal.  She also apparently had Chest CT in June which was normal.  PPD on 04/14/11 was normal.  CBC showed anemia, but white count normal.  She has been treated with at least 3 rounds of antibiotics in the last 110mo for cough, went this week to Urgent Care for cough and was given steroid which she is taking, and she has used various cough medication.   She just had surgery last week for D&C and cough was addressed at that time.  Was told that it may be her BP medication and to f/u with her primary doctor.   At this point she still has deep ongoing cough.  No other aggravating or relieving factors.  She also notes that she can't tolerate her current iron therapy due to constipation.   No other c/o.  The following portions of the patient's history were reviewed and updated as appropriate: allergies, current medications, past family history, past medical history, past social history, past surgical history and problem list.  Past Medical History  Diagnosis Date  . GERD (gastroesophageal reflux disease)   . Hypertension   . Fibroid uterus     GYN--Dr. Rivard  . Recurrent upper respiratory infection (URI)     in October - tx with abx  . Anemia     on iron  . Back pain     tx with ibuprofen 800mg   . Cough     non-productive  . Bronchitis     treated recently by abx   Review of Systems Constitutional: -fever, -chills, -sweats, -unexpected -weight change,-fatigue ENT: -runny nose, -ear pain, +sore throat Cardiology:  -chest pain, -palpitations, -edema Respiratory: +cough, -shortness of breath, -wheezing Gastroenterology: -abdominal pain, -nausea, -vomiting, -diarrhea, +constipation Hematology: -bleeding or bruising  problems Musculoskeletal: -arthralgias, -myalgias, -joint swelling, -back pain Ophthalmology: -vision changes Urology: -dysuria, -difficulty urinating, -hematuria, -urinary frequency, -urgency Neurology: -headache, -weakness, -tingling, -numbness    Objective:   Physical Exam  Filed Vitals:   05/25/11 1625  BP: 110/68  Pulse: 68  Temp: 98.5 F (36.9 C)  Resp: 16    General appearance: alert, no distress, WD/WN HEENT: normocephalic, sclerae anicteric, TMs pearly, nares patent, no discharge or erythema, pharynx normal Oral cavity: MMM, no lesions Neck: supple, no lymphadenopathy, no thyromegaly, no masses Heart: RRR, normal S1, S2, no murmurs Lungs: CTA bilaterally, no wheezes, rhonchi, or rales Ext: no edema Pulses: 2+ symmetric, upper and lower extremities, normal cap refill   Assessment and Plan :     Encounter Diagnoses  Name Primary?  . Chronic cough Yes  . GERD (gastroesophageal reflux disease)   . Anemia    Chronic cough - 6/12 chest CT normal.  CXR 04/14/11 suggestive of bronchitis, PPD negative in October. Discussed that cough etiology is a long differential.  We will target several possible triggers and refer to pulmonology. She has been taking Dexilant and will continue this.  I advised she begin Zyrtec 10mg  daily, begin sample Dulera, prescribed Tessalon prn, hydrate well, and f/u with pulmonology.  Follow-up otherwise prn.

## 2011-05-25 NOTE — Patient Instructions (Signed)
Continue Dexilant for reflux related cough.  Begin OTC Zyrtec 10mg  once daily for allergy related cough.  Begin sample of Dulera, 2 puffs twice daily for asthma related cough.  Make sure you rinse your mouth out with water after using the [facility].    I will also send Tessalon Perles, cough drops to the pharmacy that you can use as needed.  We will refer you to pulmonology for ongoing cough.   Cough, Adult  A cough is a reflex that helps clear your throat and airways. It can help heal the body or may be a reaction to an irritated airway. A cough may only last 2 or 3 weeks (acute) or may last more than 8 weeks (chronic).  CAUSES Acute cough:  Viral or bacterial infections.  Chronic cough:  Infections.   Allergies.   Asthma.   Post-nasal drip.   Smoking.   Heartburn or acid reflux.   Some medicines.   Chronic lung problems (COPD).   Cancer.  SYMPTOMS   Cough.   Fever.   Chest pain.   Increased breathing rate.   High-pitched whistling sound when breathing (wheezing).   Colored mucus that you cough up (sputum).  TREATMENT   A bacterial cough may be treated with antibiotic medicine.   A viral cough must run its course and will not respond to antibiotics.   Your caregiver may recommend other treatments if you have a chronic cough.  HOME CARE INSTRUCTIONS   Only take over-the-counter or prescription medicines for pain, discomfort, or fever as directed by your caregiver. Use cough suppressants only as directed by your caregiver.   Use a cold steam vaporizer or humidifier in your bedroom or home to help loosen secretions.   Sleep in a semi-upright position if your cough is worse at night.   Rest as needed.   Stop smoking if you smoke.  SEEK IMMEDIATE MEDICAL CARE IF:   You have pus in your sputum.   Your cough starts to worsen.   You cannot control your cough with suppressants and are losing sleep.   You begin coughing up blood.   You have difficulty  breathing.   You develop pain which is getting worse or is uncontrolled with medicine.   You have a fever.  MAKE SURE YOU:   Understand these instructions.   Will watch your condition.   Will get help right away if you are not doing well or get worse.  Document Released: 12/18/2010 Document Revised: 03/03/2011 Document Reviewed: 12/18/2010 [facility] Patient Information 2012 [place], [place].

## 2011-06-11 ENCOUNTER — Encounter: Payer: Self-pay | Admitting: Internal Medicine

## 2011-06-11 ENCOUNTER — Ambulatory Visit (INDEPENDENT_AMBULATORY_CARE_PROVIDER_SITE_OTHER): Payer: Managed Care, Other (non HMO) | Admitting: Internal Medicine

## 2011-06-11 VITALS — BP 118/70 | HR 78 | Temp 97.8°F | Ht 64.0 in | Wt 188.2 lb

## 2011-06-11 DIAGNOSIS — R05 Cough: Secondary | ICD-10-CM

## 2011-06-11 NOTE — Assessment & Plan Note (Signed)
Unclear etiology. Explained differential diagnosis and workup and management process. Spirometry is normal. PRoceed with methacholine challenge test. Followup after that

## 2011-06-11 NOTE — Progress Notes (Signed)
Subjective:    Patient ID: Michelle Mack, female    DOB: 03-26-1966, 45 y.o.   MRN: [id number]  HPI IOV 06/11/2011  45 year female. Cough for 9 months. PMD is Dr Joselyn Arrow (formerly Dr Parke Simmers). Referred by Dr Arty Baumgartner. 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   Insidious onset of cough for 9 months. INnitially PMD Dr Parke Simmers considered GERD etiology. Starrted on Rx for GERD. Then referred to Dr Loreta Ave who she has been seeing for several months. PAtient states s/p BRAVO study last week (done off dexilant for 3 days) and reportedly normal. Dexilant was not helpful. Coughs all the time, intermittently. Rates severity as mild. Considers quality: barking, laryngeal cough. Made worse after dinner or feeling hot - at this time she feels it is bronchitis. Smoke, dust, cold air have no impact on cough. Made better by unclear. Dry cough. No nocturnal cough. CT chest June 2012 - normal (personally reviewed). Does not use humidifier. No mold in the house. Denies sinus drainage. Denies currently tickle in throat. On ARB for many years. Has Dulera past week but not tried it. Takes once month advil for ovarian cycles prn. Does admit to wheeze x 3 days. No dyspnea.   RSI score is 11 - level 5 cough after lying down, level 3 sensation of lump in throat, level 2 - choking, level 1 - hoarse voice.    No family hx of asthma. Spirometry today is normal.  Past Medical History  Diagnosis Date  . GERD (gastroesophageal reflux disease)   . Hypertension   . Fibroid uterus     GYN--Dr. Rivard  . Recurrent upper respiratory infection (URI)     in October - tx with abx  . Anemia     on iron  . Back pain     tx with ibuprofen 800mg   . Cough     non-productive  . Bronchitis     treated recently by abx     Family History  Problem Relation Age of Onset  . Hypertension Mother   . Hypertension Father   . Hypertension Sister   . Thyroid nodules Sister   . Diabetes Maternal Grandmother     . Cancer Maternal Grandmother     breast     History   Social History  . Marital Status: Married    Spouse Name: N/A    Number of Children: N/A  . 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 Smokeless tobacco: Never Used  . Alcohol Use: Yes     socially 1-2 drinks, once month  . Drug Use: No  . Sexually Active: Yes    Birth Control/ Protection: None   Other Topics Concern  . Not on file   Social History Narrative  . No narrative on file     Allergies  Allergen Reactions  . Penicillins Shortness Of Breath and Swelling  . Cephalosporins Itching  . Nitrofuran Derivatives Itching    Throat, mouth, hands itch     Outpatient Prescriptions Prior to Visit  Medication Sig Dispense Refill  . ferrous fumarate-iron polysaccharide complex (TANDEM) 162-115.2 MG CAPS Take 1 capsule by mouth daily with breakfast.  30 capsule  5  . ibuprofen (ADVIL,MOTRIN) 800 MG tablet Take 1 tablet (800 mg total) by mouth every 8 (eight) hours as needed for pain (take with  food).  30 tablet    . valsartan-hydrochlorothiazide (DIOVAN-HCT) 160-12.5 MG per tablet Take 1 tablet by mouth daily.        Marland Kitchen dexlansoprazole (DEXILANT) 60 MG capsule Take 1 capsule (60 mg total) by mouth daily as needed.  30 capsule  0  . mometasone-formoterol (DULERA) 100-5 MCG/ACT AERO Inhale 2 puffs into the lungs 2 (two) times daily.  1 Inhaler  0       Review of Systems  Constitutional: Negative for fever and unexpected weight change.  HENT: Positive for congestion and sore throat. Negative for ear pain, nosebleeds, rhinorrhea, sneezing, trouble swallowing, dental problem, postnasal drip and sinus pressure.   Eyes: Negative for redness and itching.  Respiratory: Positive for cough. Negative for chest tightness, shortness of breath and wheezing.   Cardiovascular: Negative for palpitations and leg swelling.   Gastrointestinal: Negative for nausea and vomiting.  Genitourinary: Negative for dysuria.  Musculoskeletal: Negative for joint swelling.  Skin: Negative for rash.  Neurological: Negative for headaches.  Hematological: Does not bruise/bleed easily.  Psychiatric/Behavioral: Negative for dysphoric mood. The patient is not nervous/anxious.        Objective:   Physical Exam  Vitals reviewed. Constitutional: She is oriented to person, place, and time. She appears well-developed and well-nourished. No distress.       overweight  HENT:  Head: Normocephalic and atraumatic.  Right Ear: External ear normal.  Left Ear: External ear normal.  Mouth/Throat: Oropharynx is clear and moist. No oropharyngeal exudate.  Eyes: Conjunctivae and EOM are normal. Pupils are equal, round, and reactive to light. Right eye exhibits no discharge. Left eye exhibits no discharge. No scleral icterus.  Neck: Normal range of motion. Neck supple. No JVD present. No tracheal deviation present. No thyromegaly present.  Cardiovascular: Normal rate, regular rhythm, normal heart sounds and intact distal pulses.  Exam reveals no gallop and no friction rub.   No murmur heard. Pulmonary/Chest: Effort normal and breath sounds normal. No respiratory distress. She has no wheezes. She has no rales. She exhibits no tenderness.  Abdominal: Soft. Bowel sounds are normal. She exhibits no distension and no mass. There is no tenderness. There is no rebound and no guarding.  Musculoskeletal: Normal range of motion. She exhibits no edema and no tenderness.  Lymphadenopathy:    She has no cervical adenopathy.  Neurological: She is alert and oriented to person, place, and time. She has normal reflexes. No cranial nerve deficit. She exhibits normal muscle tone. Coordination normal.  Skin: Skin is warm and dry. No rash noted. She is not diaphoretic. No erythema. No pallor.  Psychiatric: She has a normal mood and affect. Her behavior is  normal. Judgment and thought content normal.          Assessment & Plan:

## 2011-06-11 NOTE — Patient Instructions (Signed)
Unclear why you are coughing Please do spirometry here today Please have methacholine challenge test and then come back to follow with me

## 2011-06-16 ENCOUNTER — Telehealth: Payer: Self-pay | Admitting: Internal Medicine

## 2011-06-16 NOTE — Telephone Encounter (Signed)
Spoke with pt. She states that her MCT was never scheduled and she also never heard about the results of her spirometry that was done same day as her last ov. I see where the order was placed for MCT but it was not done so will forward msg to [facility] and MR.  [facility] please schedule the MCT, thanks! MR, pls advise results of spirometry, thanks!

## 2011-06-16 NOTE — Telephone Encounter (Signed)
   Please apologize on my behalf for not clearly communicating spiriometry test results. IT is normal. That is why we are doing methacholine challenge test.   Thanks  MR

## 2011-06-16 NOTE — Telephone Encounter (Signed)
Pt advised she has mct [facility]  06/22/11@11 :00am

## 2011-06-21 ENCOUNTER — Encounter (HOSPITAL_COMMUNITY): Payer: Managed Care, Other (non HMO)

## 2011-06-22 ENCOUNTER — Encounter (HOSPITAL_COMMUNITY): Payer: Managed Care, Other (non HMO)

## 2011-07-14 ENCOUNTER — Institutional Professional Consult (permissible substitution): Payer: Managed Care, Other (non HMO) | Admitting: Family Medicine

## 2011-07-19 ENCOUNTER — Encounter: Payer: Self-pay | Admitting: Family Medicine

## 2011-07-19 ENCOUNTER — Ambulatory Visit (INDEPENDENT_AMBULATORY_CARE_PROVIDER_SITE_OTHER): Payer: Managed Care, Other (non HMO) | Admitting: Family Medicine

## 2011-07-19 VITALS — BP 110/70 | HR 72 | Ht 64.0 in | Wt 191.0 lb

## 2011-07-19 DIAGNOSIS — I10 Essential (primary) hypertension: Secondary | ICD-10-CM

## 2011-07-19 NOTE — Progress Notes (Signed)
Patient left without being seen, rescheduled appt

## 2011-07-28 ENCOUNTER — Institutional Professional Consult (permissible substitution): Payer: Managed Care, Other (non HMO) | Admitting: Family Medicine

## 2011-09-09 ENCOUNTER — Telehealth: Payer: Self-pay | Admitting: Family Medicine

## 2011-09-14 ENCOUNTER — Encounter (INDEPENDENT_AMBULATORY_CARE_PROVIDER_SITE_OTHER): Payer: Managed Care, Other (non HMO) | Admitting: Registered Nurse

## 2011-09-14 DIAGNOSIS — N72 Inflammatory disease of cervix uteri: Secondary | ICD-10-CM

## 2011-09-17 NOTE — Telephone Encounter (Signed)
ADDITIONAL RECORDS FAXED TO [facility]

## 2011-09-20 DIAGNOSIS — Z0279 Encounter for issue of other medical certificate: Secondary | ICD-10-CM

## 2011-10-22 ENCOUNTER — Telehealth: Payer: Self-pay | Admitting: Obstetrics and Gynecology

## 2011-10-22 NOTE — Telephone Encounter (Signed)
Routed to Michelle Mack

## 2011-10-25 NOTE — Telephone Encounter (Signed)
NA and voicemail not set up to leave message.

## 2011-10-26 NOTE — Telephone Encounter (Signed)
Attempted PC NA and unable to leave msg  ld

## 2011-11-05 ENCOUNTER — Telehealth: Payer: Self-pay | Admitting: Obstetrics and Gynecology

## 2011-11-05 MED ORDER — TERCONAZOLE 0.4 % VA CREA: 1.0000 | TOPICAL_CREAM | Freq: Every day | VAGINAL | Status: AC

## 2011-11-05 MED ORDER — TERCONAZOLE 0.4 % VA CREA: 1.0000 | TOPICAL_CREAM | Freq: Every day | VAGINAL | Status: DC

## 2011-11-05 NOTE — Telephone Encounter (Signed)
Pt called and spoke with Creig Hines and c/o regarding not getting call back pt has yeast infection and wants meds.we had attempted call back to pt a couple of weeks ago when ashe call unable to reach ibuprofen spoke with SR regarding RX for Terazol 7 pt requested  She oked called RX in to [facility] [address] left message for pt that RX called to [facility] RN

## 2011-11-15 ENCOUNTER — Encounter: Payer: Self-pay | Admitting: Obstetrics and Gynecology

## 2011-11-15 ENCOUNTER — Ambulatory Visit (INDEPENDENT_AMBULATORY_CARE_PROVIDER_SITE_OTHER): Payer: Managed Care, Other (non HMO) | Admitting: Obstetrics and Gynecology

## 2011-11-15 VITALS — BP 104/64 | Temp 98.8°F | Resp 16 | Ht 64.0 in | Wt 186.0 lb

## 2011-11-15 DIAGNOSIS — N898 Other specified noninflammatory disorders of vagina: Secondary | ICD-10-CM

## 2011-11-15 LAB — POCT WET PREP (WET MOUNT): Clue Cells Wet Prep Whiff POC: POSITIVE

## 2011-11-15 NOTE — Progress Notes (Signed)
Presents with c/o of green discharge, no irritation declines std testing no new partners, constipation with bm daily but hard since surgery adomen nontender, Normal hair distrubition mons pubis,  EGBUS WNL, sterile speculum exam,  vagina pink, moist normal rugae,  cerix LTC, no cervical motion tenderness, No adnexal masses or tenderness, mod amount white discharge No odor none GC/CHL delined WET PREP neg clue, neg trich, neg hyphae A normal discharge P if irritation return for comfort Baking soda bathes x20 minutes BID, avoid fragance soaps and douching, exercise water mililax for constipation discussed.     F/O  As scheduled   Prn, annually Lavera Guise, CNM

## 2011-11-17 ENCOUNTER — Ambulatory Visit (INDEPENDENT_AMBULATORY_CARE_PROVIDER_SITE_OTHER): Payer: Managed Care, Other (non HMO) | Admitting: Medical

## 2011-11-17 ENCOUNTER — Encounter: Payer: Self-pay | Admitting: Medical

## 2011-11-17 VITALS — BP 110/78 | HR 72 | Temp 98.5°F | Resp 16 | Wt 188.0 lb

## 2011-11-17 DIAGNOSIS — R0789 Other chest pain: Secondary | ICD-10-CM

## 2011-11-17 DIAGNOSIS — M542 Cervicalgia: Secondary | ICD-10-CM

## 2011-11-17 DIAGNOSIS — M5412 Radiculopathy, cervical region: Secondary | ICD-10-CM

## 2011-11-17 MED ORDER — HYDROCODONE-ACETAMINOPHEN 5-500 MG PO TABS: 1.0000 | ORAL_TABLET | Freq: Four times a day (QID) | ORAL | Status: AC | PRN

## 2011-11-17 MED ORDER — METHYLPREDNISOLONE 4 MG PO KIT: PACK | ORAL | Status: AC

## 2011-11-17 NOTE — Patient Instructions (Signed)
Cervical Radiculopathy Cervical radiculopathy happens when a nerve in the neck is pinched or bruised by a slipped (herniated) disk or by arthritic changes in the bones of the cervical spine. This can occur due to an injury or as part of the normal aging process. Pressure on the cervical nerves can cause pain or numbness that runs from your neck all the way down into your arm and fingers. CAUSES  There are many possible causes, including:  Injury.   Muscle tightness in the neck from overuse.   Swollen, painful joints (arthritis).   Breakdown or degeneration in the bones and joints of the spine (spondylosis) due to aging.   Bone spurs that may develop near the cervical nerves.  SYMPTOMS  Symptoms include pain, weakness, or numbness in the affected arm and hand. Pain can be severe or irritating. Symptoms may be worse when extending or turning the neck. DIAGNOSIS  Your caregiver will ask about your symptoms and do a physical exam. He or she may test your strength and reflexes. X-rays, CT scans, and MRI scans may be needed in cases of injury or if the symptoms do not go away after a period of time. Electromyography (EMG) or nerve conduction testing may be done to study how your nerves and muscles are working. TREATMENT  Your caregiver may recommend certain exercises to help relieve your symptoms. Cervical radiculopathy can, and often does, get better with time and treatment. If your problems continue, treatment options may include:  Wearing a soft collar for short periods of time.   Physical therapy to strengthen the neck muscles.   Medicines, such as nonsteroidal anti-inflammatory drugs (NSAIDs), oral corticosteroids, or spinal injections.   Surgery. Different types of surgery may be done depending on the cause of your problems.  HOME CARE INSTRUCTIONS   Put ice on the affected area.   Put ice in a plastic bag.   Place a towel between your skin and the bag.   Leave the ice on for 15  to 20 minutes, 3 to 4 times a day or as directed by your caregiver.   Use a flat pillow when you sleep.   Only take over-the-counter or prescription medicines for pain, discomfort, or fever as directed by your caregiver.   If physical therapy was prescribed, follow your caregiver's directions.   If a soft collar was prescribed, use it as directed.  SEEK IMMEDIATE MEDICAL CARE IF:   Your pain gets much worse and cannot be controlled with medicines.   You have weakness or numbness in your hand, arm, face, or leg.   You have a high fever or a stiff, rigid neck.   You lose bowel or bladder control (incontinence).   You have trouble with walking, balance, or speaking.  MAKE SURE YOU:   Understand these instructions.   Will watch your condition.   Will get help right away if you are not doing well or get worse.  Document Released: 03/16/2001 Document Revised: 06/10/2011 Document Reviewed: 02/02/2011 Exit[employer] Patient Information 2012 [employer]. 

## 2011-11-17 NOTE — Progress Notes (Signed)
Subjective:   HPI  Michelle Mack is a 46 y.o. female who presents with 3 day hx/o left sided neck pain that radiates down to left arm.  Constantly the arm tingling and throbs, 7/10 pain, preventing her from sleeping.  Using Ibuprofen 800mg  Q12 hours without relief.  Has had some headache.  Monday night had chest tightness that lasted 5 minutes, but no other chest symptoms.  Felt like food got stuck when swallowing, but that resolved.  Monday had some shaking chills and fatigue.  She is a nonsmoker, occasional alcohol.  She continues to work but in a lot of pain in neck and arm.  No family hx/o CVA, heart disease.  No other aggravating or relieving factors.  No other c/o.  The following portions of the patient's history were reviewed and updated as appropriate: allergies, current medications, past family history, past medical history, past social history, past surgical history and problem list.  Past Medical History  Diagnosis Date  . GERD (gastroesophageal reflux disease)   . Hypertension   . Fibroid uterus     GYN--Dr. Rivard  . Recurrent upper respiratory infection (URI)     in October - tx with abx  . Anemia     on iron  . Back pain     tx with ibuprofen 800mg   . Cough     non-productive  . Bronchitis     treated recently by abx    Allergies  Allergen Reactions  . Penicillins Shortness Of Breath and Swelling  . Cephalosporins Itching  . Nitrofuran Derivatives Itching    Throat, mouth, hands itch   Review of Systems Constitutional: -fever, -chills, -sweats, -unexpected -weight change,+fatigue ENT: -runny nose, -ear pain, -sore throat Cardiology:  -palpitations, -edema Respiratory: -cough, -shortness of breath, -wheezing Gastroenterology: -abdominal pain, -nausea, -vomiting, -diarrhea, -constipation , no loss of bowel function Ophthalmology: +blurred vision when looking at small numbers on computer screen Urology: -dysuria, -difficulty urinating, -hematuria, -urinary  frequency, -urgency, no loss of bladder function Neurology: -headache, -weakness -numbness      Objective:   Physical Exam  General appearance: alert, no distress, WD/WN Oral cavity: MMM, no lesions Neck: tender left and posterior neck, ROM with pain, no lymphadenopathy, no thyromegaly, no masses Heart: RRR, normal S1, S2, no murmurs Lungs: CTA bilaterally, no wheezes, rhonchi, or rales Pulses: 2+ symmetric, upper and lower extremities, normal cap refill MSK: tender along whole left arm generalized, no point tenders, ROM normal Neuro: CN2-12 intact, strength and sensation of left arm normal to light and sharp touch, DTRs normal, +Phalen left arm, tinel's negative, otherwise nonfocal exam   Adult ECG Report  Indication: chest discomfort  Rate: 76bpm  Rhythm: normal sinus rhythm  QRS Axis: 36 degrees  PR Interval:  QRS Duration: 76ms  QTc:  Conduction Disturbances: none  Other Abnormalities: none  Patient's cardiac risk factors are: hypertension and obesity (BMI >= 30 kg/m2).  EKG comparison: none  Narrative Interpretation: normal EKG    Assessment and Plan :     Encounter Diagnoses  Name Primary?  . Neck pain Yes  . Radicular syndrome of upper limbs   . Chest discomfort    Reviewed EKG, advised that symptoms don't suggest cardiac issue.   Will treat for radicular symptoms.   Script for Lortab for pain, Medrol Dosepak.  Can use heat, massage, gentle ROM and stretching exercise.  Recheck 2wk, sooner prn.

## 2011-11-30 ENCOUNTER — Ambulatory Visit: Payer: Self-pay | Admitting: Obstetrics and Gynecology

## 2011-12-13 ENCOUNTER — Other Ambulatory Visit: Payer: Self-pay | Admitting: Family Medicine

## 2011-12-13 ENCOUNTER — Ambulatory Visit
Admission: RE | Admit: 2011-12-13 | Discharge: 2011-12-13 | Disposition: A | Discharge status: Home / Self Care | Payer: Managed Care, Other (non HMO) | Source: Ambulatory Visit | Attending: Family Medicine | Admitting: Family Medicine

## 2011-12-13 DIAGNOSIS — R52 Pain, unspecified: Secondary | ICD-10-CM

## 2012-01-07 DIAGNOSIS — N938 Other specified abnormal uterine and vaginal bleeding: Secondary | ICD-10-CM | POA: Insufficient documentation

## 2012-01-07 DIAGNOSIS — N979 Female infertility, unspecified: Secondary | ICD-10-CM | POA: Insufficient documentation

## 2012-01-12 ENCOUNTER — Encounter: Payer: Self-pay | Admitting: Obstetrics and Gynecology

## 2012-01-12 ENCOUNTER — Ambulatory Visit (INDEPENDENT_AMBULATORY_CARE_PROVIDER_SITE_OTHER): Payer: Managed Care, Other (non HMO) | Admitting: Obstetrics and Gynecology

## 2012-01-12 VITALS — BP 112/58 | Ht 64.0 in | Wt 190.0 lb

## 2012-01-12 DIAGNOSIS — Z01419 Encounter for gynecological examination (general) (routine) without abnormal findings: Secondary | ICD-10-CM

## 2012-01-12 DIAGNOSIS — Z124 Encounter for screening for malignant neoplasm of cervix: Secondary | ICD-10-CM

## 2012-01-12 NOTE — Progress Notes (Signed)
The patient reports:normal menses, no abnormal bleeding, pelvic pain or discharge  Contraception:condoms  Last mammogram: was normal November 2012 Last pap: was normal May  2012  GC/Chlamydia cultures offered: declined HIV/RPR/HbsAg offered:  declined HSV 1 and 2 glycoprotein offered: declined  Menstrual cycle regular and monthly: Yes Menstrual flow normal: Yes  Urinary symptoms: none Normal bowel movements: Yes Reports abuse at home: No  Subjective:    Michelle Mack is a 46 y.o. female, [id number], who presents for an annual exam.     History   Social History  . Marital Status: Married    Spouse Name: N/A    Number of Children: N/A  . 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 Smokeless tobacco: Never Used  . Alcohol Use: Yes     socially 1-2 drinks, once a  month (mixed drinks)  . Drug Use: No  . Sexually Active: Yes    Birth Control/ Protection: None   Other Topics Concern  . None   Social History Narrative  . None    Menstrual cycle:   LMP: Patient's last menstrual period was 01/07/2012.           Cycle: monthly/ 7 days/3 heavy days with 1 pad 3 hours/ no clots/ no pain  The following portions of the patient's history were reviewed and updated as appropriate: allergies, current medications, past family history, past medical history, past social history, past surgical history and problem list.  Review of Systems Pertinent items are noted in HPI. Breast:Negative for breast lump,nipple discharge or nipple retraction Gastrointestinal: Negative for abdominal pain, change in bowel habits or rectal bleeding Urinary:negative   Objective:    BP 112/58  Ht 5\' 4"  (1.626 m)  Wt 190 lb (86.183 kg)  BMI 32.61 kg/m2  LMP 01/07/2012    Weight:  Wt Readings from Last 1 Encounters:  01/12/12 190 lb (86.183 kg)          BMI: Body mass index is 32.61  kg/(m^2).  General Appearance: Alert, appropriate appearance for age. No acute distress HEENT: Grossly normal Neck / Thyroid: Supple, no masses, nodes or enlargement Lungs: clear to auscultation bilaterally Back: No CVA tenderness Breast Exam: No dimpling, nipple retraction or discharge. No masses or nodes. and No masses or nodes.No dimpling, nipple retraction or discharge. Cardiovascular: Regular rate and rhythm. S1, S2, no murmur Gastrointestinal: Soft, non-tender, no masses or organomegaly Pelvic Exam: Vulva and vagina appear normal. Bimanual exam reveals normal uterus and adnexa. Uterus bulky 12-14 weeks: unchanged Rectovaginal: normal rectal, no masses Lymphatic Exam: Non-palpable nodes in neck, clavicular, axillary, or inguinal regions Skin: no rash or abnormalities Neurologic: Normal gait and speech, no tremor  Psychiatric: Alert and oriented, appropriate affect.    Urinalysis:not applicable UPT: Not done   Assessment:    Normal gyn exam    Plan:    mammogram pap smear return annually or prn STD screening: declined Contraception:no method      Treysean Petruzzi AMD

## 2012-01-13 LAB — PAP IG W/ RFLX HPV ASCU

## 2012-03-02 ENCOUNTER — Ambulatory Visit (INDEPENDENT_AMBULATORY_CARE_PROVIDER_SITE_OTHER): Payer: Managed Care, Other (non HMO) | Admitting: Obstetrics and Gynecology

## 2012-03-02 ENCOUNTER — Encounter: Payer: Self-pay | Admitting: Obstetrics and Gynecology

## 2012-03-02 VITALS — BP 114/72 | HR 76 | Temp 98.7°F | Wt 194.0 lb

## 2012-03-02 DIAGNOSIS — N926 Irregular menstruation, unspecified: Secondary | ICD-10-CM

## 2012-03-02 DIAGNOSIS — N920 Excessive and frequent menstruation with regular cycle: Secondary | ICD-10-CM

## 2012-03-02 DIAGNOSIS — R109 Unspecified abdominal pain: Secondary | ICD-10-CM

## 2012-03-02 DIAGNOSIS — Z1321 Encounter for screening for nutritional disorder: Secondary | ICD-10-CM

## 2012-03-02 DIAGNOSIS — Z1329 Encounter for screening for other suspected endocrine disorder: Secondary | ICD-10-CM

## 2012-03-02 LAB — CBC
Hemoglobin: 11.8 g/dL — ABNORMAL LOW (ref 12.0–15.0)
MCH: 27.1 pg (ref 26.0–34.0)
MCHC: 32.9 g/dL (ref 30.0–36.0)
Platelets: 357 10*3/uL (ref 150–400)
RDW: 14.8 % (ref 11.5–15.5)

## 2012-03-02 LAB — POCT URINE PREGNANCY: Preg Test, Ur: NEGATIVE

## 2012-03-02 MED ORDER — NORETHINDRONE ACETATE 5 MG PO TABS: ORAL_TABLET | ORAL | Status: DC

## 2012-03-02 NOTE — Progress Notes (Signed)
46 YO S/P myomectomy with a history of anemia, dysfunctional uterine bleeding and adenomyosis presents with complaints of menorrhagia.  Bled this month x 14 days-still bleeding with tampon with pad change every hour.  Patient had HSC/D & C with polyp resection last year and periods were normal until now.  Admits to passing clots and cramping rated 4/10 relieved with Ibuprofen 800 mg.  Denies urinary tract symptoms, dyspareunia or change in bowel movements.  Prior to menses, patient noticed a green discharge but denies any itching or odor with it.   O: Abdomen: soft, diffusely tender, firm mass from pelvis to mid-point between symphysis and umbilicus, no guarding      Pelvic:EGBUS-wnl, vagina-large blood with clots, cervix-clots coming from os, no lesions;  uterus: appears 12 weeks size, mildly tender though exam limited     by habitus, adnexae-no masses or tenderness  U/A-negative UPT-negative  A: Menorrhagia with history of the same     Uterine Fibriods/Adenomyosis     H/O Vaginal Discharge  P;  Reviewed management options to include:  BCPs, Mirena, ablation, myomectomy, Lysteda and hysterectomy.      Patient wants to observe for now       CBC-pending       Reviewed yeast and bacterial vaginal infections       Aygestin 5 mg # 30 1-2  po qd  until no bleeding for 12 days no refills      RTO- follow up with Dr. Estanislado Pandy prn  Henreitta Leber, PA-C

## 2012-03-02 NOTE — Progress Notes (Signed)
When did bleeding start: 02/13/12 then 02/27/12 How often changing pad/tampon: every 15 min Bleeding Disorders: no Cramping: yes Contraception: no Fibroids: yes Hormone Therapy: no New Medications: no Menopausal Symptoms: no Vag. Discharge: yes with a green color Abdominal Pain: no Increased Stress: no  Pt is thinking about hyst; want to know how long are you out of work and pt want to know will she continue to get yeast and bacterial infections after hyst.

## 2012-03-03 ENCOUNTER — Telehealth: Payer: Self-pay

## 2012-03-03 LAB — VITAMIN D 25 HYDROXY (VIT D DEFICIENCY, FRACTURES): Vit D, 25-Hydroxy: 18 ng/mL — ABNORMAL LOW (ref 30–89)

## 2012-03-03 MED ORDER — VITAMIN D (ERGOCALCIFEROL) 1.25 MG (50000 UNIT) PO CAPS: ORAL_CAPSULE | ORAL | Status: DC

## 2012-03-03 NOTE — Telephone Encounter (Signed)
Message copied by Winfred Leeds on Fri Mar 03, 2012 11:47 AM ------      Message from: Henreitta Leber      Created: Fri Mar 03, 2012  8:06 AM       Please initiate the Vitamin D Protocol.  Thanks.  EP

## 2012-03-03 NOTE — Telephone Encounter (Signed)
TC TO PT REGARDING VIT D LEVEL. INFORMED PT  THAT VIT D LEVEL IS LOW AND WE NEED TO CALL IN RX FOR PT. WENT OVER PROTOCOL AND WILL SEND IN MED TO PT PHARMACY. PT VOICED UNDERSTANDING.

## 2012-03-31 ENCOUNTER — Telehealth: Payer: Self-pay | Admitting: Family Medicine

## 2012-03-31 ENCOUNTER — Encounter: Payer: Self-pay | Admitting: Medical

## 2012-03-31 ENCOUNTER — Ambulatory Visit (INDEPENDENT_AMBULATORY_CARE_PROVIDER_SITE_OTHER): Payer: Managed Care, Other (non HMO) | Admitting: Medical

## 2012-03-31 VITALS — BP 108/78 | HR 92 | Temp 98.7°F | Resp 18 | Wt 193.0 lb

## 2012-03-31 DIAGNOSIS — R079 Chest pain, unspecified: Secondary | ICD-10-CM

## 2012-03-31 DIAGNOSIS — R0989 Other specified symptoms and signs involving the circulatory and respiratory systems: Secondary | ICD-10-CM

## 2012-03-31 DIAGNOSIS — R0602 Shortness of breath: Secondary | ICD-10-CM

## 2012-03-31 LAB — LIPID PANEL
Cholesterol: 156 mg/dL (ref 0–200)
LDL Cholesterol: 99 mg/dL (ref 0–99)
Total CHOL/HDL Ratio: 4.1 Ratio
VLDL: 19 mg/dL (ref 0–40)

## 2012-03-31 LAB — COMPREHENSIVE METABOLIC PANEL
ALT: 17 U/L (ref 0–35)
AST: 17 U/L (ref 0–37)
Alkaline Phosphatase: 68 U/L (ref 39–117)
Creat: 1 mg/dL (ref 0.50–1.10)
Sodium: 138 mEq/L (ref 135–145)
Total Bilirubin: 0.7 mg/dL (ref 0.3–1.2)
Total Protein: 7.4 g/dL (ref 6.0–8.3)

## 2012-03-31 MED ORDER — ALBUTEROL SULFATE HFA 108 (90 BASE) MCG/ACT IN AERS: 2.0000 | INHALATION_SPRAY | Freq: Four times a day (QID) | RESPIRATORY_TRACT | Status: DC | PRN

## 2012-03-31 NOTE — Telephone Encounter (Signed)
PATIENT IS AWARE OF HER APPOINTMENT TO SEE DR. TILLEY ON 04/03/12 @ 215 PM [phone]. CLS   I FAX OVER OV NOTES,LABS, INSURANCE CARD. CLS

## 2012-03-31 NOTE — Progress Notes (Addendum)
Subjective:   HPI  Michelle Mack is a 46 y.o. female who presents for c/o chest pain.  She notes chest pain x 1 wk.  She notes chest pain lasts about 10 minutes, is intermittent, does have associated SOB.  symptoms include DOE, worse going up stairs.  She does notes dyspnea lying down.  No prior similar.  Denies associated nausea, diaphoresis, palpitations, edema.  She is a nonsmoker.  She ended up seeing pulmonology a few months ago for chronic cough.  No specific cause found.  She was suppose to have test to eval for asthma (methacoline) challenge, but there was some issue where she had PFTs but not the challenge test.  She wonders if she has asthma.  She notes no major stressors, so she doesn't think this is related to anxiety or panic attack. She notes no hx/o GERD.  Her only other recent symptoms are fatigue and headaches.  In general she exercises at home with kickboxing aerobics with Michelle Mack videos.  She typically doesn't seem to have chest pain with this exercise.  No other aggravating or relieving factors.    The following portions of the patient's history were reviewed and updated as appropriate: allergies, current medications, past family history, past medical history, past social history, past surgical history and problem list.  Allergies  Allergen Reactions  . Penicillins Shortness Of Breath and Swelling  . Cephalosporins Itching  . Nitrofuran Derivatives Itching    Throat, mouth, hands itch    Current Outpatient Prescriptions on File Prior to Visit  Medication Sig Dispense Refill  . valsartan-hydrochlorothiazide (DIOVAN-HCT) 160-12.5 MG per tablet Take 1 tablet by mouth daily.        . Vitamin D, Ergocalciferol, (DRISDOL) 50000 UNITS CAPS TAKE ONE  CAPSULE 2 TIMES WEEKLY FOR 8 WEEKS  28 capsule  0  . albuterol (PROVENTIL HFA;VENTOLIN HFA) 108 (90 BASE) MCG/ACT inhaler Inhale 2 puffs into the lungs every 6 (six) hours as needed for wheezing.  1 Inhaler  0    Past Medical  History  Diagnosis Date  . GERD (gastroesophageal reflux disease)   . Hypertension   . Fibroid uterus     GYN--Dr. Rivard  . Recurrent upper respiratory infection (URI)     in October - tx with abx  . Anemia     on iron  . Back pain     tx with ibuprofen 800mg   . Cough     non-productive  . Bronchitis     treated recently by abx  . Infertility, female   . DUB (dysfunctional uterine bleeding)     Past Surgical History  Procedure Date  . Myomectomy 2002  . Endometrial vaporization w/ versapoint     Family History  Problem Relation Age of Onset  . Hypertension Mother   . Hypertension Father   . Hypertension Sister   . Thyroid nodules Sister   . Diabetes Maternal Grandmother   . Breast cancer Maternal Grandmother   . Heart disease Maternal Grandmother   . Heart disease Paternal Grandmother     History   Social History  . Marital Status: Married    Spouse Name: N/A    Number of Children: N/A  . 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 Smokeless tobacco: Never Used  . Alcohol Use: Yes     socially 1-2 drinks, once a  month (mixed drinks)  . Drug Use: No  . Sexually Active: Yes    Birth Control/ Protection: None   Other Topics Concern  . Not on file   Social History Narrative  . No narrative on file   Review of Systems ROS reviewed and was negative other than noted in HPI or above.    Objective:   Physical Exam  General appearance: alert, no distress, WD/WN HEENT: normocephalic, sclerae anicteric, TMs pearly, nares patent, no discharge or erythema, pharynx normal Oral cavity: MMM, no lesions Neck: supple, no lymphadenopathy, no thyromegaly, no masses Heart: RRR, normal S1, S2, no murmurs Lungs: CTA bilaterally, no wheezes, rhonchi, or rales Abdomen: +bs, soft, non tender, non distended, no masses, no hepatomegaly, no splenomegaly Pulses: 2+  symmetric, upper and lower extremities, normal cap refill   Assessment and Plan :     Encounter Diagnoses  Name Primary?  . Chest pain Yes  . DOE (dyspnea on exertion)   . SOB (shortness of breath)    I reviewed her visit from May for atypical chest discomfort and the EKG at that time.  I also reviewed her Chest CT report from June which was normal.  Given her cardiac symptoms, will refer to cardiology for additional evaluation.  May end up needing to go back to pulmonology for asthma evaluation/methacoline challenge test depending upon cardiology eval.  Declines flu vaccine today.

## 2012-04-03 ENCOUNTER — Telehealth: Payer: Self-pay | Admitting: Family Medicine

## 2012-04-03 ENCOUNTER — Other Ambulatory Visit: Payer: Self-pay | Admitting: Cardiology

## 2012-04-03 NOTE — Telephone Encounter (Signed)
Message copied by Janeice Robinson on Mon Apr 03, 2012  9:17 AM ------      Message from: Jac Canavan      Created: Fri Mar 31, 2012 10:40 PM       Refer to cardiology - chest pain, SOB, DOE.  Send OV notes, EKG, any recent labs.

## 2012-04-03 NOTE — Telephone Encounter (Signed)
PATIENT IS AWARE OF HER APPOINTMENT TO SEE DR. TILLEY ON 04/03/12 @ 215 PM. CLS

## 2012-04-03 NOTE — Progress Notes (Signed)
Michelle Mack    Date of visit:  04/03/2012 DOB:  Apr 29, 1966    Age:  46 yrs. Medical record number:  [id number]     Account number:  [id number] Primary Care Provider: Sharlot Gowda C ____________________________ CURRENT DIAGNOSES  1. Dyspnea  2. Chest Pain  3. Hypertension,Essential (Benign)  4. GERD  5. Obesity(BMI30-40) ____________________________ ALLERGIES  Cephalosporin - 1st Generation  nitrofuran macrocrystal (bulk)  Penicillin - Natural (i.e. Pen G, Pen V) ____________________________ MEDICATIONS  1. valsartan-hydrochlorothiazide 160-12.5 mg tablet, 1 p.o. daily  2. Vitamin D2 50,000 unit capsule, weekly  3. albuterol sulfate 90 mcg/actuation HFA Aerosol Inhaler, PRN  4. multivitamin tablet, 1 p.o. daily ____________________________ CHIEF COMPLAINTS  Dyspnea with walking up steps  Stabbing chest pain for few seconds ____________________________ HISTORY OF PRESENT ILLNESS  This very nice 46 year old female is seen for evaluation of chest pain and dyspnea. She has a prior history of hypertension as well as obesity. She has recently had some dyspnea on exertion when walking up steps and complains of a stabbing type chest pain that lasts for a few seconds. It is not necessarily related to exertion. At other times she may have a squeezing type chest pain that she is somewhat vague in describing. The discomfort again his not pleuritic and is not associated with exertion. She saw her family physician recently and the cardiac evaluation was advised. She did have a pulmonary evaluation several years ago and not much came out of that.  ____________________________ PAST HISTORY  Past Medical Illnesses:  hypertension, GERD, anemia;  Cardiovascular Illnesses:  no previous history of cardiac disease.;  Surgical Procedures:  myomectomy, endometrial vaporization;  Cardiology Procedures-Invasive:  no history of prior cardiac procedures;  Cardiology Procedures-Noninvasive:  no previous  non-invasive procedures;  LVEF not documented ____________________________ CARDIO-PULMONARY TEST DATES EKG Date:  04/03/2012;   ____________________________ FAMILY HISTORY Father - age 22,  alive and well,  diabetes-unknown type and  hypertension; Mother - age 104,  alive and well,  diabetes-unknown type and  hypertension; Brother 1 - age 65,  alive and well; Brother 2 - age 67,  alive and well; Sister 1 - age 27,  alive and well;  ____________________________ SOCIAL HISTORY Alcohol Use:  socially;  Smoking:  never smoked;  Diet:  regular diet;  Lifestyle:  married and 1 daughter;  Exercise:  aerobics 3 days per week;  Occupation:  Clinical biochemist at [employer];  Residence:  lives with husband;   ____________________________ REVIEW OF SYSTEMS General:  mild obesity  Integumentary:  no rashes or new skin lesions.Eyes:  denies diplopia, history of glaucoma or visual problems.  Respiratory:  Dyspnea with exertion  Cardiovascular:  please review HPI Abdominal:  denies dyspepsia, GI bleeding, constipation, or diarrhea  Genitourinary-Female:  no dysuria, urgency, frequency, UTIs, or stress incontinence  Musculoskeletal:  denies arthritis, venous insufficiency, or muscle weakness.  Neurological:  denies headaches, stroke, or TIA  Psychiatric:  denies depession or anxiety.Hematological/Immunologic:  denies any food allergies, bleeding disorders. ____________________________ PHYSICAL EXAMINATION VITAL SIGNS  Blood Pressure:  120/80 Sitting, Left arm, regular cuff  , 120/84 Standing, Left arm and regular cuff   Pulse:  80/min. Weight:  192.00 lbs. Height:  64"BMI: 33  Constitutional:  cooperative, alert and oriented,well developed, well nourished, in no acute distress. Skin:  warm and dry to touch, no apparent skin lesions, or masses noted. Head:  normocephalic, normal hair pattern, no masses or tenderness Eyes:  EOMS Intact, PERRLA, C and S clear, Funduscopic exam not done.  ENT:  ears, nose and  throat reveal no gross abnormalities.  Dentition good. Neck:  supple, without massess. No JVD, thyromegaly or carotid bruits. Carotid upstroke normal. Chest:  normal symmetry, clear to auscultation and percussion. Cardiac:  regular rhythm, normal S1 and S2, No S3 or S4, no murmurs, gallops or rubs detected. Abdomen:  abdomen soft,non-tender, no masses, no hepatospenomegaly, or aneurysm noted Peripheral Pulses:  the femoral,dorsalis pedis, and posterior tibial pulses are full and equal bilaterally with no bruits auscultated. Extremities & Back:  no deformities, clubbing, cyanosis, erythema or edema observed. Normal muscle strength and tone. Neurological:  no gross motor or sensory deficits noted, affect appropriate, oriented x3. ____________________________ MOST RECENT LIPID PANEL 03/31/12  CHOL TOTL 156 mg/dl, LDL 99 calc, HDL 38 mg/dl, TRIGLYCER 94 mg/dl and CHOL/HDL 4.1 (Calc) ____________________________ IMPRESSIONS/PLAN  1. Chest pain with atypical features with a normal EKG but with risk factors of hypertension 2. Obesity with need to lose weight 3. Hypertension controlled 4. Hyperlipidemia with low HDL for a female  Recommendations:  EKG is normal. Recommended that she have an echocardiogram to assess her right ventricular dimensions and to screen for pulmonary hypertension and also that she have a regular standard exercise treadmill test. Follow up afterwards. ____________________________ TODAYS ORDERS  1. treadmill:  Regular TM At [facility]  2. 2D, color flow, doppler: First Available  3. 12 Lead EKG: Today                       ____________________________ Cardiology Physician:  Darden Palmer MD [facility]

## 2012-04-28 ENCOUNTER — Telehealth: Payer: Self-pay | Admitting: Obstetrics and Gynecology

## 2012-04-28 NOTE — Telephone Encounter (Signed)
Spoke with pt rgd msg pt want to schd vit recheck pt will call back to schd

## 2012-05-10 ENCOUNTER — Telehealth: Payer: Self-pay | Admitting: Obstetrics and Gynecology

## 2012-05-12 ENCOUNTER — Other Ambulatory Visit: Payer: Managed Care, Other (non HMO)

## 2012-05-15 ENCOUNTER — Other Ambulatory Visit: Payer: Managed Care, Other (non HMO)

## 2012-05-15 DIAGNOSIS — E569 Vitamin deficiency, unspecified: Secondary | ICD-10-CM

## 2012-05-16 ENCOUNTER — Telehealth: Payer: Self-pay | Admitting: Obstetrics and Gynecology

## 2012-05-16 DIAGNOSIS — E559 Vitamin D deficiency, unspecified: Secondary | ICD-10-CM

## 2012-05-16 LAB — VITAMIN D 25 HYDROXY (VIT D DEFICIENCY, FRACTURES): Vit D, 25-Hydroxy: 28 ng/mL — ABNORMAL LOW (ref 30–89)

## 2012-05-16 MED ORDER — ERGOCALCIFEROL 1.25 MG (50000 UT) PO CAPS: 50000.0000 [IU] | ORAL_CAPSULE | ORAL | Status: DC

## 2012-05-16 NOTE — Telephone Encounter (Signed)
Tc to pt per telephone call. Told pt Vit D level=28. Vit D protocol reviewed with pt.  Rx for Vit D 50k IU e-pres to pharm on file. Repeat Vit D sched 08/16/12 @ 4:00p with lab only. Pt agrees.

## 2012-05-17 ENCOUNTER — Other Ambulatory Visit: Payer: Self-pay

## 2012-05-17 ENCOUNTER — Telehealth: Payer: Self-pay

## 2012-05-17 NOTE — Telephone Encounter (Signed)
ALREADY TAKEN CARE OF BY CHANDRA.

## 2012-05-17 NOTE — Telephone Encounter (Signed)
Message copied by Winfred Leeds on Wed May 17, 2012  8:55 AM ------      Message from: Henreitta Leber      Created: Tue May 16, 2012  7:22 AM       Please perform Vitamin D protocol for this patient. Thank you.  EP

## 2012-05-24 ENCOUNTER — Encounter: Payer: Self-pay | Admitting: Family Medicine

## 2012-05-24 ENCOUNTER — Ambulatory Visit (INDEPENDENT_AMBULATORY_CARE_PROVIDER_SITE_OTHER): Payer: Managed Care, Other (non HMO) | Admitting: Family Medicine

## 2012-05-24 VITALS — BP 110/70 | HR 80 | Temp 98.4°F | Ht 64.0 in | Wt 196.0 lb

## 2012-05-24 DIAGNOSIS — R350 Frequency of micturition: Secondary | ICD-10-CM

## 2012-05-24 DIAGNOSIS — R300 Dysuria: Secondary | ICD-10-CM

## 2012-05-24 DIAGNOSIS — R7301 Impaired fasting glucose: Secondary | ICD-10-CM

## 2012-05-24 DIAGNOSIS — D259 Leiomyoma of uterus, unspecified: Secondary | ICD-10-CM | POA: Insufficient documentation

## 2012-05-24 LAB — POCT URINALYSIS DIPSTICK
Ketones, UA: NEGATIVE
Leukocytes, UA: NEGATIVE
Protein, UA: NEGATIVE
Urobilinogen, UA: NEGATIVE
pH, UA: 5

## 2012-05-24 MED ORDER — CIPROFLOXACIN HCL 250 MG PO TABS: 250.0000 mg | ORAL_TABLET | Freq: Two times a day (BID) | ORAL | Status: DC

## 2012-05-24 MED ORDER — FLUCONAZOLE 150 MG PO TABS: 150.0000 mg | ORAL_TABLET | Freq: Once | ORAL | Status: DC

## 2012-05-24 NOTE — Progress Notes (Signed)
Chief Complaint  Patient presents with  . Urinary Tract Infection    burning and frequency with urination that started today. Also complains of lower abdominal pain, also vaginal itching. No discharge.   Having lower abdominal pain with voiding, starting today.  +urinary frequency, small amounts. Some odor to the urine.  Reports some odor in the genital area in general for about a week, no vaginal discharge. Also having some vaginal itching.  Itching is external and internal.  Denies rash.  Periods are normal, not spotting now.  Sugars vary from 107-140's.  She was started on metformin by her GYN a week ago.  She had an A1c done through her insurance--she recalls it was abnormal, but not what the number was.  That bloodwork was about a year ago.  No blood was drawn by GYN recently.  She had LDL 99 and glucose of 95 on last labs done by [facility] in September.  Vitamin D deficiency--treated by GYN.  Was 28 last week.  Had some diarrhea and stomach pain/burning initially after starting on metformin, but improving.  Stools still somewhat loose, but no pain.  Past Medical History  Diagnosis Date  . GERD (gastroesophageal reflux disease)   . Hypertension   . Fibroid uterus     GYN--Dr. Rivard  . Recurrent upper respiratory infection (URI)     in October - tx with abx  . Anemia     on iron  . Back pain     tx with ibuprofen 800mg   . Cough     non-productive  . Bronchitis     treated recently by abx  . Infertility, female   . DUB (dysfunctional uterine bleeding)    Past Surgical History  Procedure Date  . Myomectomy 2002  . Endometrial vaporization w/ versapoint    History   Social History  . Marital Status: Married    Spouse Name: N/A    Number of Children: N/A  . 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 Smokeless tobacco: Never Used  . Alcohol Use: Yes     Comment:  socially 1-2 drinks, once a  month (mixed drinks)  . Drug Use: No  . Sexually Active: Yes    Birth Control/ Protection: None   Other Topics Concern  . Not on file   Social History Narrative  . No narrative on file   Current outpatient prescriptions:ergocalciferol (VITAMIN D2) 50000 UNITS capsule, Take 1 capsule (50,000 Units total) by mouth once a week., Disp: 12 capsule, Rfl: 0;  metFORMIN (GLUCOPHAGE) 500 MG tablet, Take 500 mg by mouth daily., Disp: , Rfl: ;  valsartan-hydrochlorothiazide (DIOVAN-HCT) 160-12.5 MG per tablet, Take 1 tablet by mouth daily.  , Disp: , Rfl:  albuterol (PROVENTIL HFA;VENTOLIN HFA) 108 (90 BASE) MCG/ACT inhaler, Inhale 2 puffs into the lungs every 6 (six) hours as needed for wheezing., Disp: 1 Inhaler, Rfl: 0  Allergies  Allergen Reactions  . Penicillins Shortness Of Breath and Swelling  . Cephalosporins Itching  . Nitrofuran Derivatives Itching    Throat, mouth, hands itch   ROS:  Denies fevers, nausea, vomiting, flank pain, polydipsia, polyuria.  +loose stools since starting metformin.  Denies rashes, chest pain, cough, URI symptoms, shortness of breath or other concerns except as per HPI.  PHYSICAL EXAM: BP 110/70  Pulse 80  Temp 98.4 F (36.9 C) (Oral)  Ht  5\' 4"  (1.626 m)  Wt 196 lb (88.905 kg)  BMI 33.64 kg/m2  LMP 05/10/2012 Pleasant female in no distress Heart: regular rate and rhythm Lungs: clear bilaterally Back: No CVA tenderness Abdomen: soft.  Uterus is enlarged, palpable on abdominal exam, and diffusely tender.  Only minimally tender over bladder. External genitalia is normal without any visible discharge, redness, rash Psych: normal mood, affect, hygiene and grooming  ASSESSMENT/PLAN:  1. Urinary frequency  POCT Urinalysis Dipstick  2. Burning with urination  POCT Urinalysis Dipstick, Urine culture, ciprofloxacin (CIPRO) 250 MG tablet  3. Impaired fasting glucose  HgB A1c  4. Fibroid uterus      Dysuria--send for culture.   Treat presumptively with cipro.  Previously tolerated this medication.  Give diflucan in case itching worsens, discharge develops while on ABX.  ?DM--recently started on metformin by GYN.  Check A1c today--6.4.  Discussed dx of IFG, proper diet, need for exercise, weight loss.  Recommend repeating A1c in 6 months, sooner prn if sugars running higher at home.  HTN--well controlled. Reviewed labs done by [facility] recently.  Lipids at goal, fasitng glucose was 95.  Strongly encouraged flu shot--reviewed risks/benefits.  She declines today.  Encouraged her to think about and return or get elsewhere.

## 2012-05-24 NOTE — Patient Instructions (Addendum)
Drink plenty of fluids. Take the cipro twice daily. If itching worsens, and/or if white discharge develops, then take the fluconazole that was prescribed to treat yeast infection.  If symptoms don't improve with the antibiotics, call us next week (Monday)  If your stool remains very loose from the metformin, try taking a fiber supplement such as metamucil  Your A1c was 6.4. This is borderline for diabetes (diabetes is diagnosed if A1c is greater than 6.5).  I agree with taking the metformin once daily.  I recommend you try and lose weight, avoid carbs, exercise daily, and return in 6 months.  Diabetes and Exercise Regular exercise is important and can help:   Control blood glucose (sugar).  Decrease blood pressure.    Control blood lipids (cholesterol, triglycerides).  Improve overall health. BENEFITS FROM EXERCISE  Improved fitness.  Improved flexibility.  Improved endurance.  Increased bone density.  Weight control.  Increased muscle strength.  Decreased body fat.  Improvement of the body's use of insulin, a hormone.  Increased insulin sensitivity.  Reduction of insulin needs.  Reduced stress and tension.  Helps you feel better. People with diabetes who add exercise to their lifestyle gain additional benefits, including:  Weight loss.  Reduced appetite.  Improvement of the body's use of blood glucose.  Decreased risk factors for heart disease:  Lowering of cholesterol and triglycerides.  Raising the level of good cholesterol (high-density lipoproteins, HDL).  Lowering blood sugar.  Decreased blood pressure. TYPE 1 DIABETES AND EXERCISE  Exercise will usually lower your blood glucose.  If blood glucose is greater than 240 mg/dl, check urine ketones. If ketones are present, do not exercise.  Location of the insulin injection sites may need to be adjusted with exercise. Avoid injecting insulin into areas of the body that will be exercised. For  example, avoid injecting insulin into:  The arms when playing tennis.  The legs when jogging. For more information, discuss this with your caregiver.  Keep a record of:  Food intake.  Type and amount of exercise.  Expected peak times of insulin action.  Blood glucose levels. Do this before, during, and after exercise. Review your records with your caregiver. This will help you to develop guidelines for adjusting food intake and insulin amounts.  TYPE 2 DIABETES AND EXERCISE  Regular physical activity can help control blood glucose.  Exercise is important because it may:  Increase the body's sensitivity to insulin.  Improve blood glucose control.  Exercise reduces the risk of heart disease. It decreases serum cholesterol and triglycerides. It also lowers blood pressure.  Those who take insulin or oral hypoglycemic agents should watch for signs of hypoglycemia. These signs include dizziness, shaking, sweating, chills, and confusion.  Body water is lost during exercise. It must be replaced. This will help to avoid loss of body fluids (dehydration) or heat stroke. Be sure to talk to your caregiver before starting an exercise program to make sure it is safe for you. Remember, any activity is better than none.  Document Released: 09/11/2003 Document Revised: 09/13/2011 Document Reviewed: 12/26/2008 [facility] Patient Information 2013 [place], [place].

## 2012-05-26 LAB — URINE CULTURE

## 2012-05-30 ENCOUNTER — Other Ambulatory Visit: Payer: Self-pay | Admitting: Obstetrics and Gynecology

## 2012-05-30 DIAGNOSIS — Z1231 Encounter for screening mammogram for malignant neoplasm of breast: Secondary | ICD-10-CM

## 2012-07-11 ENCOUNTER — Ambulatory Visit
Admission: RE | Admit: 2012-07-11 | Discharge: 2012-07-11 | Disposition: A | Discharge status: Home / Self Care | Payer: Managed Care, Other (non HMO) | Source: Ambulatory Visit | Attending: Obstetrics and Gynecology | Admitting: Obstetrics and Gynecology

## 2012-07-11 DIAGNOSIS — Z1231 Encounter for screening mammogram for malignant neoplasm of breast: Secondary | ICD-10-CM

## 2012-07-15 ENCOUNTER — Emergency Department (HOSPITAL_COMMUNITY)
Admission: EM | Admit: 2012-07-15 | Discharge: 2012-07-15 | Disposition: A | Discharge status: Home / Self Care | Payer: Managed Care, Other (non HMO) | Attending: Emergency Medicine | Admitting: Emergency Medicine

## 2012-07-15 ENCOUNTER — Emergency Department (HOSPITAL_COMMUNITY): Payer: Managed Care, Other (non HMO)

## 2012-07-15 DIAGNOSIS — Z8742 Personal history of other diseases of the female genital tract: Secondary | ICD-10-CM | POA: Insufficient documentation

## 2012-07-15 DIAGNOSIS — Z8709 Personal history of other diseases of the respiratory system: Secondary | ICD-10-CM | POA: Insufficient documentation

## 2012-07-15 DIAGNOSIS — Z8719 Personal history of other diseases of the digestive system: Secondary | ICD-10-CM | POA: Insufficient documentation

## 2012-07-15 DIAGNOSIS — Z79899 Other long term (current) drug therapy: Secondary | ICD-10-CM | POA: Insufficient documentation

## 2012-07-15 DIAGNOSIS — R0602 Shortness of breath: Secondary | ICD-10-CM

## 2012-07-15 DIAGNOSIS — I10 Essential (primary) hypertension: Secondary | ICD-10-CM | POA: Insufficient documentation

## 2012-07-15 DIAGNOSIS — R059 Cough, unspecified: Secondary | ICD-10-CM | POA: Insufficient documentation

## 2012-07-15 DIAGNOSIS — Z862 Personal history of diseases of the blood and blood-forming organs and certain disorders involving the immune mechanism: Secondary | ICD-10-CM | POA: Insufficient documentation

## 2012-07-15 DIAGNOSIS — R05 Cough: Secondary | ICD-10-CM | POA: Insufficient documentation

## 2012-07-15 LAB — CBC
HCT: 35.6 % — ABNORMAL LOW (ref 36.0–46.0)
Hemoglobin: 11.5 g/dL — ABNORMAL LOW (ref 12.0–15.0)
MCH: 27.1 pg (ref 26.0–34.0)
RBC: 4.25 MIL/uL (ref 3.87–5.11)

## 2012-07-15 LAB — POCT I-STAT TROPONIN I

## 2012-07-15 LAB — BASIC METABOLIC PANEL
BUN: 12 mg/dL (ref 6–23)
CO2: 28 mEq/L (ref 19–32)
Glucose, Bld: 98 mg/dL (ref 70–99)
Potassium: 3.5 mEq/L (ref 3.5–5.1)
Sodium: 135 mEq/L (ref 135–145)

## 2012-07-15 LAB — PRO B NATRIURETIC PEPTIDE: Pro B Natriuretic peptide (BNP): 16.2 pg/mL (ref 0–125)

## 2012-07-15 MED ORDER — BENZONATATE 100 MG PO CAPS: 100.0000 mg | ORAL_CAPSULE | Freq: Three times a day (TID) | ORAL | Status: DC

## 2012-07-15 NOTE — ED Notes (Signed)
Pt reports one month of cough and sternal sharp chest pain, cough is non productive. Reports inability to lay flat at night due to feeling as if she can not breath, must sleep with head elevated on pillows, lying flat also makes chest pain worse, deep breathing makes chest pain worse.  Pt denies nausea, denies diaphoresis, denies lightheaded and dizziness.

## 2012-07-15 NOTE — ED Provider Notes (Signed)
History     CSN: [id number]  Arrival date & time 07/15/12  1754   First MD Initiated Contact with Patient 07/15/12 2009      Chief Complaint  Patient presents with  . Chest Pain    (Consider location/radiation/quality/duration/timing/severity/associated sxs/prior treatment) HPI  47 year old female with history of GERD, hypertension, and recurrent upper respiratory infection presents complaining of persistent cough. Patient reports she has had a dry nonproductive cough for over a year.  She also reports having sensation of shortness of breath worsening whenever she lies flat or when she exerts herself. This has been persistent, and ongoing for over a month. States she cannot sleep while laying flat at night, usually sleeps with 2 or 3 pillows. The shortness of breath is more noticeable when she takes deep breath.  She reports having sharp midsternal cp when cough.  She denies fever, chills, runny nose, sneezing, productive cough, hemoptysis, back pain, abdominal pain, nausea, vomiting, diarrhea, rash. She reports she has had extensive evaluation for complaint through her primary care Dr. including CT scan and MRIs but without any specific result. At the urging of the husband she is here to the ED today for further evaluation.  Pt reports she has had treadmill test last year but report no significant changes.    Past Medical History  Diagnosis Date  . GERD (gastroesophageal reflux disease)   . Hypertension   . Fibroid uterus     GYN--Dr. Rivard  . Recurrent upper respiratory infection (URI)     in October - tx with abx  . Anemia     on iron  . Back pain     tx with ibuprofen 800mg   . Cough     non-productive  . Bronchitis     treated recently by abx  . Infertility, female   . DUB (dysfunctional uterine bleeding)     Past Surgical History  Procedure Date  . Myomectomy 2002  . Endometrial vaporization w/ versapoint     Family History  Problem Relation Age of Onset  .  Hypertension Mother   . Hypertension Father   . Hypertension Sister   . Thyroid nodules Sister   . Diabetes Maternal Grandmother   . Breast cancer Maternal Grandmother   . Heart disease Maternal Grandmother   . Heart disease Paternal Grandmother     History  Substance Use Topics  . 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 Smokeless tobacco: Never Used  . Alcohol Use: Yes     Comment: socially 1-2 drinks, once a  month (mixed drinks)    OB History    Grav Para Term Preterm Abortions TAB SAB Ect Mult Living   5 0 0  4 2 2    0      Review of Systems  Constitutional:       10 Systems reviewed and all are negative for acute change except as noted in the HPI.     Allergies  Penicillins; Ampicillin; Cephalosporins; and Nitrofuran derivatives  Home Medications   Current Outpatient Rx  Name  Route  Sig  Dispense  Refill  . ALBUTEROL SULFATE HFA 108 (90 BASE) MCG/ACT IN AERS   Inhalation   Inhale 2 puffs into the lungs every 6 (six) hours as needed for wheezing.   1 Inhaler   0   . ERGOCALCIFEROL 50000 UNITS PO CAPS   Oral   Take 1 capsule (50,000 Units total) by mouth once a week.   12 capsule   0   .  ADULT MULTIVITAMIN W/MINERALS CH   Oral   Take 1 tablet by mouth daily.         Marland Kitchen SAXAGLIPTIN HCL 2.5 MG PO TABS   Oral   Take 5 mg by mouth daily.         Marland Kitchen VALSARTAN-HYDROCHLOROTHIAZIDE 160-12.5 MG PO TABS   Oral   Take 1 tablet by mouth daily.             BP 129/77  Pulse 80  Temp 98.4 F (36.9 C) (Oral)  Resp 18  SpO2 100%  LMP 07/08/2012  Physical Exam  Nursing note and vitals reviewed. Constitutional: She appears well-developed and well-nourished. No distress.       Awake, alert, nontoxic appearance  HENT:  Head: Atraumatic.  Eyes: Conjunctivae normal are normal. Right eye exhibits no discharge. Left eye exhibits no discharge.  Neck: Neck supple.  Cardiovascular: Normal rate and regular rhythm.  Exam reveals no gallop and no friction rub.   No  murmur heard. Pulmonary/Chest: Effort normal. No respiratory distress. She has no wheezes. She has no rales. She exhibits no tenderness.  Abdominal: Soft. There is no tenderness. There is no rebound.  Musculoskeletal: She exhibits no edema and no tenderness.       ROM appears intact, no obvious focal weakness  Neurological:       Mental status and motor strength appears intact  Skin: No rash noted.  Psychiatric: She has a normal mood and affect.    ED Course  Procedures (including critical care time)   Date: 07/15/2012  Rate: 87  Rhythm: normal sinus rhythm  QRS Axis: normal  Intervals: normal  ST/T Wave abnormalities: normal  Conduction Disutrbances: none  Narrative Interpretation:   Old EKG Reviewed: none available for comparison    Labs Reviewed  CBC - Abnormal; Notable for the following:    Hemoglobin 11.5 (*)     HCT 35.6 (*)     All other components within normal limits  BASIC METABOLIC PANEL - Abnormal; Notable for the following:    GFR calc non Af Amer 73 (*)     GFR calc Af Amer 84 (*)     All other components within normal limits  POCT I-STAT TROPONIN I   Dg Chest 2 View  07/15/2012  *RADIOLOGY REPORT*  Clinical Data: 47 year old female chest pain.  Hypertension diabetes.  CHEST - 2 VIEW  Comparison: CT 12/14/2010.  Chest radiographs 08/26/2006.  Findings: Lung volumes are stable and within normal limits. Cardiac size and mediastinal contours are within normal limits. Lung parenchyma is stable and clear.  No pneumothorax or effusion. Stable mild scoliosis. No acute osseous abnormality identified.  IMPRESSION: Negative, no acute cardiopulmonary abnormality.   Original Report Authenticated By: Erskine Speed, M.D.      No diagnosis found.  1. Persistent cough 2. SOB  MDM  Pt reports persistent dry cough for over a year.  She does have hx of GERD, and this may be related to her cough.  She does not take any ACEi for concerns of ACEi related cough.  Her CXR today  has been unremarkable  Pt also endorse SOB and DOE for over a month.  She reports having extensive work up by her PCP along with negative treadmill test in the past.  However pt is satting at 100% on RA and in no acute resp distress.  Her lung exam has been unremarkable, and there are no evidence of fluid overload.    Pt currently  without acute complaint.  Care discussed with my attending.   9:00 PM Pt's labs are unremarkable. Normal ECG, CXR, trop. Normal electrolytes, H&H.  Unremarkable pro bnp.  Result was discussed with patient, and i recommend f/u with PCP for further care.    BP 129/77  Pulse 80  Temp 98.4 F (36.9 C) (Oral)  Resp 18  SpO2 100%  LMP 07/08/2012  I have reviewed nursing notes and vital signs. I personally reviewed the imaging tests through PACS system  I reviewed available ER/hospitalization records thought the EMR      Fayrene Helper, [place] 07/15/12 2102

## 2012-07-15 NOTE — ED Notes (Signed)
Patient transported to X-ray 

## 2012-07-21 NOTE — ED Provider Notes (Signed)
Medical screening examination/treatment/procedure(s) were performed by non-physician practitioner and as supervising physician I was immediately available for consultation/collaboration.   Shemia Bevel M Jeramy Dimmick, MD 07/21/12 2149 

## 2012-08-10 ENCOUNTER — Ambulatory Visit (INDEPENDENT_AMBULATORY_CARE_PROVIDER_SITE_OTHER): Payer: Managed Care, Other (non HMO) | Admitting: Family Medicine

## 2012-08-10 ENCOUNTER — Encounter: Payer: Self-pay | Admitting: Family Medicine

## 2012-08-10 VITALS — BP 122/78 | HR 72 | Ht 64.0 in | Wt 184.0 lb

## 2012-08-10 DIAGNOSIS — M545 Low back pain, unspecified: Secondary | ICD-10-CM

## 2012-08-10 DIAGNOSIS — R059 Cough, unspecified: Secondary | ICD-10-CM

## 2012-08-10 DIAGNOSIS — N898 Other specified noninflammatory disorders of vagina: Secondary | ICD-10-CM

## 2012-08-10 DIAGNOSIS — L293 Anogenital pruritus, unspecified: Secondary | ICD-10-CM

## 2012-08-10 DIAGNOSIS — R071 Chest pain on breathing: Secondary | ICD-10-CM

## 2012-08-10 DIAGNOSIS — R05 Cough: Secondary | ICD-10-CM

## 2012-08-10 DIAGNOSIS — R3129 Other microscopic hematuria: Secondary | ICD-10-CM

## 2012-08-10 DIAGNOSIS — K644 Residual hemorrhoidal skin tags: Secondary | ICD-10-CM

## 2012-08-10 DIAGNOSIS — R0789 Other chest pain: Secondary | ICD-10-CM

## 2012-08-10 LAB — POCT URINALYSIS DIPSTICK
Glucose, UA: NEGATIVE
Nitrite, UA: NEGATIVE
Spec Grav, UA: 1.01
Urobilinogen, UA: NEGATIVE

## 2012-08-10 MED ORDER — HYDROCORTISONE 2.5 % RE CREA: TOPICAL_CREAM | Freq: Two times a day (BID) | RECTAL | Status: DC

## 2012-08-10 MED ORDER — METHYLPREDNISOLONE 4 MG PO KIT: PACK | ORAL | Status: DC

## 2012-08-10 NOTE — Progress Notes (Signed)
Chief Complaint  Patient presents with  . Vaginal Itching    no discharge, just itching. No itching or pain while urinating. Also has rib pain x 1 month.  Also some LBP.   HPI:  Vaginal itching has been ongoing since last visit here (11/20).  At that time she also had itching, no vaginal discharge, but also had urinary complaints (urgency, frequency, and lower abdominal pain).  She was treated with Cipro, and also took Diflucan.  She states that the itching got better for only about 2 days.  No further urinary complaints.  She comes in for evaluation today because she has noted a "bump on my butt" since last week.   Feels the bump when she wipes.  It is itchy, not painful.  Was having some straining with bowel movements/constipated after taking iron pills (about 3-4 months ago).  Has been off the iron for a few months, but just noticed the swelling.  Denies any rectal bleeding, blood on toilet paper when she wipes (although she had her period twice last month, so she might not have noticed).  She is having both vaginal and rectal itching.  The vaginal itching is just on the labia, not deep inside.  No vaginal discharge.  She has used a cream that was previously prescribed (by her GYN?) with some benefit.  Believes it was an antifungal cream (Terazol rx'd per Epic, pt states it is a different cream).  Pain under both of her breasts and lower sternum for a month.  She has been taking ibuprofen 800mg  four times/day, since she was discharged from ER last month.  It helps, but she needs to take it 4x/day.  She was also given hydrocodone from ER, but she never took it.   She was seen in ER for chest pain and cough.  Normal labs, EKG, CXR.  Hasn't had reflux in over a year.  Having throat clearing with coughing.  Denies runny nose, congestion.  Has never used allergy meds to help with cough.  Past Medical History  Diagnosis Date  . GERD (gastroesophageal reflux disease)   . Hypertension   . Fibroid uterus      GYN--Dr. Rivard  . Recurrent upper respiratory infection (URI)     in October - tx with abx  . Anemia     on iron  . Back pain     tx with ibuprofen 800mg   . Cough     non-productive  . Bronchitis     treated recently by abx  . Infertility, female   . DUB (dysfunctional uterine bleeding)    Past Surgical History  Procedure Date  . Myomectomy 2002  . Endometrial vaporization w/ versapoint    History   Social History  . Marital Status: Married    Spouse Name: N/A    Number of Children: N/A  . 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 Smokeless tobacco: Never Used  . Alcohol Use: Yes     Comment: socially 1-2 drinks, once a  month (mixed drinks)  . Drug Use: No  . Sexually Active: Yes    Birth Control/ Protection: None   Other Topics Concern  . Not on file   Social History Narrative  . No narrative on file   Current Outpatient Prescriptions on File Prior to Visit  Medication Sig Dispense Refill  . Multiple  Vitamin (MULTIVITAMIN WITH MINERALS) TABS Take 1 tablet by mouth daily.      . valsartan-hydrochlorothiazide (DIOVAN-HCT) 160-12.5 MG per tablet Take 1 tablet by mouth daily.        Marland Kitchen albuterol (PROVENTIL HFA;VENTOLIN HFA) 108 (90 BASE) MCG/ACT inhaler Inhale 2 puffs into the lungs every 6 (six) hours as needed for wheezing.  1 Inhaler  0  . saxagliptin HCl (ONGLYZA) 2.5 MG TABS tablet Take 5 mg by mouth daily.       Allergies  Allergen Reactions  . Penicillins Shortness Of Breath and Swelling  . Ampicillin   . Cephalosporins Itching  . Nitrofuran Derivatives Itching    Throat, mouth, hands itch   ROS:  Denies fevers, chills, nausea, vomiting, bowel changes, abdominal pain.  Denies edema, skin rash/lesions.  +cough, chest and back pain, vaginal itching per HPI.  Denies dysuria, incontinence, vaginal discharge.  PHYSICAL EXAM: BP 122/78  Pulse  72  Ht 5\' 4"  (1.626 m)  Wt 184 lb (83.462  kg)  BMI 31.58 kg/m2  LMP 07/08/2012 Well developed female in no distress.  Frequent throat clearing, rare cough HEENT:  PERRL, conjunctiva clear.  TM's and EAC's normal.  Nasal mucosa moderately edematous with thick white mucus R>L. Sinuses nontender.  OP clear Neck: no lymphadenopathy or mass Back: Tender at rhomboids bilaterally.  No spine or CVA tenderness Chest: Tender across entire sternum, all the way down to xiphoid process.  Also tender along anterior portion of inferior ribs bilaterally.   Abdomen nontender. No epigastric tenderness, organomegaly, rebound tenderness or guarding. External vaginal area is normal.  Area of itching is just inside labia--entirely normal exam.  No discharge. Rectal--there is a bulging hemorrhoid on left side.  Nonthrombosed, nontender.  Otherwise normal exam.  No erythema, rash.  Urine dip-- 2+ blood, trace leukocytes  ASSESSMENT/PLAN:  1. LBP (low back pain)  POCT Urinalysis Dipstick  2. External hemorrhoid  hydrocortisone (ANUSOL-HC) 2.5 % rectal cream  3. Chest wall pain  methylPREDNISolone (MEDROL, PAK,) 4 MG tablet  4. Cough  methylPREDNISolone (MEDROL, PAK,) 4 MG tablet  5. Vaginal itching    6. Microscopic hematuria  Ambulatory referral to Urology    Vaginal itching--?etiology. No evidence of yeast infection.  Recommend trial of Vagisil for irritation. Rectal itching--likely related to hemorrhoid.  Use Anusol HC cream as needed.  Chest wall pain--? Etiology.  Tender diffusely across sternum and ribs, as well as into back.  Possibly related to coughing.  Cough likely due to PND given nasal exam today. Recommend sinus rinse/neti-pot, guaifenesin (mucinex or robitussin).   Not improving with 3 weeks of higher than recommended doses of ibuprofen  Steroid course as anti-inflammatory-- may also help with cough, PND  Risks/benefits/side effects of steroids reviewed.  Review of chart shows blood on  urine dipstick dating back 2 and 3 years ago Refer to urology for workup

## 2012-08-10 NOTE — Patient Instructions (Addendum)
Try Vagisil cream for the vaginal itching--do not get the yeast kind, as there doesn't appear to be a yeast infection.  I believe there is a version just for irritation.  Use the Anusol HC cream (prescription) to the hemorrhoid.  Take the steroid pack as directed.  Do NOT take ibuprofen while taking the steroids.  If you develop any stomach pain, start taking prilosec (or leftover Dexilant).  In general, do not take more than 2400mg  of ibuprofen in 24 hours or you have a higher risk of damaging your kidneys and causing stomach ulcers.  Try using claritin or zyrtec along with sinus rinses (neti-pot or sinus rinse kit), along with Mucinex or Robitussin (ingredient guaifenesin as the expectorant) to dry up and thin out the mucus in the nose.  This nasal mucus is going down the back of your throat and contributing to your cough.  You are being referred to urologist for evaluation of microscopic hematuria (blood in the urine), which has now been present for at least 2-3 years, and needs further evaluation.

## 2012-08-11 ENCOUNTER — Encounter: Payer: Self-pay | Admitting: Family Medicine

## 2012-08-14 ENCOUNTER — Telehealth: Payer: Self-pay | Admitting: *Deleted

## 2012-08-14 NOTE — Telephone Encounter (Signed)
Called patient and left her a message informing her that she is scheduled with [facility], Dr.Manny for 09/05/12 @ 11:30 am. Records were faxed to [phone].

## 2012-08-16 ENCOUNTER — Other Ambulatory Visit: Payer: Managed Care, Other (non HMO)

## 2012-08-21 ENCOUNTER — Other Ambulatory Visit: Payer: Managed Care, Other (non HMO)

## 2012-08-30 ENCOUNTER — Encounter: Payer: Self-pay | Admitting: Internal Medicine

## 2012-09-07 ENCOUNTER — Encounter: Payer: Self-pay | Admitting: Cardiology

## 2012-09-07 DIAGNOSIS — E669 Obesity, unspecified: Secondary | ICD-10-CM

## 2012-09-07 NOTE — Progress Notes (Signed)
Patient ID: RIDHI HOFFERT, female   DOB: 04-May-1966, 47 y.o.   MRN: [id number] Aaradhya, Kysar    Date of visit:  09/07/2012 DOB:  [birth year]    Age:  47 yrs. Medical record number:  [id number]     Account number:  [id number] Primary Care Provider: Sharlot Gowda C ____________________________ CURRENT DIAGNOSES  1. Diabetes Mellitus-NIDD  2. Dyspnea  3. Chest Pain  4. Hypertension,Essential (Benign)  5. GERD  6. Obesity(BMI30-40)  7. Dyspnea ____________________________ ALLERGIES  Cephalosporin - 1st Generation  nitrofuran macrocrystal (bulk)  Penicillin - Natural (i.e. Pen G, Pen V) ____________________________ MEDICATIONS  1. valsartan-hydrochlorothiazide 160-12.5 mg tablet, 1 p.o. daily  2. Vitamin D2 50,000 unit capsule, weekly  3. albuterol sulfate 90 mcg/actuation HFA Aerosol Inhaler, PRN  4. multivitamin tablet, 1 p.o. daily  5. Onglyza 2.5 mg tablet, 1 p.o. daily  6. Savella 50 mg tablet, BID  7. Vimovo 500-20 mg tablet,IR & delay rel,mphase, 1 p.o. daily  8. Lyrica 50 mg capsule, 1 p.o. daily ____________________________ CHIEF COMPLAINTS  Chest pain with deep breath or coughing  Dyspnea ____________________________ HISTORY OF PRESENT ILLNESS  Patient seen for cardiac followup. I saw her in September when she was having chest pain as well as dyspnea and recommended a treadmill as well as an echocardiogram. She failed to show for 2 appointments and has not been seen since then. She was seen in the emergency room in January  with sharp chest pain and had a low BNP. She later saw her primary physician who noted chest wall type pain. She later went to an urgent care Center recently and comes in today bringing a report of an elevated CRP with instructions to followup with cardiologist. She is limited with chest discomfort and shortness of breath. She describes the chest pain as a sharp pleuritic-type pain and it is definitely worse when she takes a deep breath. She states she is  unable to be active because of the pain. She has been diagnosed as prediabetic and states that she has been placed on Lyrica as well as another agent for either diabetic neuropathy or fibromyalgia. She has had a previous pulmonary evaluation. I am not convinced this is anginal pain. ____________________________ PAST HISTORY  Past Medical Illnesses:  hypertension, GERD, anemia, obesity, DM-non-insulin dependent;  Cardiovascular Illnesses:  no previous history of cardiac disease.;  Surgical Procedures:  myomectomy, endometrial vaporization;  Cardiology Procedures-Invasive:  no history of prior cardiac procedures;  Cardiology Procedures-Noninvasive:  no previous non-invasive procedures;  LVEF not documented ____________________________ CARDIO-PULMONARY TEST DATES EKG Date:  09/07/2012;  Chest Xray Date: 08/26/2012;   ____________________________ REVIEW OF SYSTEMS General:  mild obesity  Integumentary:  no rashes or new skin lesions. Eyes:  denies diplopia, history of glaucoma or visual problems.  Respiratory:  dyspnea with exertion Cardiovascular:  please review HPI  Abdominal:  denies dyspepsia, GI bleeding, constipation, or diarrhea  Genitourinary-Female:  no dysuria, urgency, frequency, UTIs, or stress incontinence  Musculoskeletal:  chest pain  Neurological:  denies headaches, stroke, or TIA Psychiatric:  situational stress ____________________________ PHYSICAL EXAMINATION VITAL SIGNS  Blood Pressure:  134/80 Sitting, Right arm, regular cuff  , 128/78 Standing, Right arm and regular cuff   Pulse:  76/min. Weight:  176.00 lbs. Height:  64"BMI: 30  Constitutional:  pleasant BF in NAD Skin:  warm and dry to touch, no apparent skin lesions, or masses noted. Head:  normocephalic, normal hair pattern, no masses or tenderness Eyes:  EOMS Intact, PERRLA, C and S clear,  Funduscopic exam not done. ENT:  ears, nose and throat reveal no gross abnormalities.  Dentition good. Neck:  supple, without massess.  No JVD, thyromegaly or carotid bruits. Carotid upstroke normal. Chest:  normal symmetry, clear to auscultation and percussion. tender to touch Cardiac:  regular rhythm, normal S1 and S2, No S3 or S4, no murmurs, gallops or rubs detected. Abdomen:  abdomen soft,non-tender, no masses, no hepatospenomegaly, or aneurysm noted Peripheral Pulses:  the femoral,dorsalis pedis, and posterior tibial pulses are full and equal bilaterally with no bruits auscultated. Extremities & Back:  no deformities, clubbing, cyanosis, erythema or edema observed. Normal muscle strength and tone. Neurological:  no gross motor or sensory deficits noted, affect appropriate, oriented x3. ____________________________ MOST RECENT LIPID PANEL 03/31/12  CHOL TOTL 156 mg/dl, LDL 99 calc, HDL 38 mg/dl, TRIGLYCER 94 mg/dl and CHOL/HDL 4.1 (Calc) ____________________________ IMPRESSIONS/PLAN  1. Continued dyspnea and chest pain with atypical features some pleuritic 2. Elevated CRP 3. Hypertension 4. New diagnosis of impaired fasting glucose  Recommendations:  Discussed that we need to obtain her records. She does have some pleuritic chest pain and she could have lupus or some arthritic condition particularly with an elevated CRP. Prior to obtaining additional laboratory studies, I recommended the get lab from the urgent care center that she saw them that she have an exercise treadmill and an echo. We talked about the importance of keeping her appointments. Followup after. ____________________________ TODAYS ORDERS  1. 2D, color flow, doppler: First Available  2. treadmill:  Regular TM First Available  3. Obtain Lab/Xray from PMD  4. 12 Lead EKG: Today                       ____________________________ Cardiology Physician:  Darden Palmer MD [facility]

## 2012-09-25 ENCOUNTER — Ambulatory Visit: Payer: Self-pay | Admitting: Medical

## 2012-09-28 ENCOUNTER — Encounter: Payer: Self-pay | Admitting: *Deleted

## 2012-10-06 ENCOUNTER — Ambulatory Visit: Payer: Managed Care, Other (non HMO) | Admitting: Internal Medicine

## 2012-10-12 ENCOUNTER — Encounter: Payer: Self-pay | Admitting: Family Medicine

## 2012-11-08 ENCOUNTER — Other Ambulatory Visit: Payer: Self-pay | Admitting: Family Medicine

## 2012-11-08 DIAGNOSIS — R05 Cough: Secondary | ICD-10-CM

## 2012-11-13 ENCOUNTER — Inpatient Hospital Stay: Admission: RE | Admit: 2012-11-13 | Payer: Managed Care, Other (non HMO) | Source: Ambulatory Visit

## 2012-12-26 ENCOUNTER — Ambulatory Visit: Payer: Managed Care, Other (non HMO) | Admitting: Internal Medicine

## 2012-12-26 DIAGNOSIS — Z0289 Encounter for other administrative examinations: Secondary | ICD-10-CM

## 2012-12-27 ENCOUNTER — Ambulatory Visit: Payer: Managed Care, Other (non HMO) | Admitting: Internal Medicine

## 2013-01-10 ENCOUNTER — Encounter: Payer: Self-pay | Admitting: Family Medicine

## 2013-01-16 ENCOUNTER — Ambulatory Visit
Admission: RE | Admit: 2013-01-16 | Discharge: 2013-01-16 | Disposition: A | Discharge status: Home / Self Care | Payer: Managed Care, Other (non HMO) | Source: Ambulatory Visit | Attending: Family Medicine | Admitting: Family Medicine

## 2013-01-16 DIAGNOSIS — R05 Cough: Secondary | ICD-10-CM

## 2013-02-28 ENCOUNTER — Encounter: Payer: Self-pay | Admitting: Family Medicine

## 2013-02-28 ENCOUNTER — Ambulatory Visit (INDEPENDENT_AMBULATORY_CARE_PROVIDER_SITE_OTHER): Payer: Managed Care, Other (non HMO) | Admitting: Family Medicine

## 2013-02-28 VITALS — BP 102/60 | HR 80 | Temp 98.6°F | Ht 64.0 in | Wt 181.0 lb

## 2013-02-28 DIAGNOSIS — M545 Low back pain, unspecified: Secondary | ICD-10-CM

## 2013-02-28 DIAGNOSIS — N390 Urinary tract infection, site not specified: Secondary | ICD-10-CM

## 2013-02-28 DIAGNOSIS — R1013 Epigastric pain: Secondary | ICD-10-CM

## 2013-02-28 DIAGNOSIS — R109 Unspecified abdominal pain: Secondary | ICD-10-CM

## 2013-02-28 LAB — POCT URINALYSIS DIPSTICK
Protein, UA: NEGATIVE
Urobilinogen, UA: NEGATIVE

## 2013-02-28 MED ORDER — CIPROFLOXACIN HCL 500 MG PO TABS: 500.0000 mg | ORAL_TABLET | Freq: Two times a day (BID) | ORAL | Status: DC

## 2013-02-28 MED ORDER — ESOMEPRAZOLE MAGNESIUM 40 MG PO CPDR: 40.0000 mg | DELAYED_RELEASE_CAPSULE | Freq: Every day | ORAL | Status: DC

## 2013-02-28 NOTE — Patient Instructions (Signed)
Back and abdominal pain. Given abnormal urine test today, and pain over bladder and kidneys, will treat presumptively for urinary infection and we are sending urine for culture.    Appears to also have a musculoskeletal component.  Continue with warm compresses/heat.  Try ibuprofen 800 mg three times daily with food.  Also take the Nexium samples you were given, once daily--this will treat any acid-related problem (pain in your upper stomach could be from reflux or gastritis/inflammation in stomach).  Return in 5-7 days if symptoms persist/worsen.  Return sooner if having fevers, worsening pain, vomiting.

## 2013-02-28 NOTE — Progress Notes (Signed)
Chief Complaint  Patient presents with  . Abdominal Pain    and lbp x 2 weeks. Some nausea, no fevers. Left arm feels like its "vibrating." Also left leg from hip to knee has some numbness that comes and goes, this is worsened when she is having the abdominal pain.    She is sore across entire stomach, including the upper stomach and back, but her main complaint is suprapubic/lower abdominal pain in center.  She denies any urgency/frequency/dysuria/hematuria.  Feels different than prior bladder infections. She has been having pain x 2 weeks, getting worse.  Back pain is bilateral, up and down whole lower back.    No change with eating, bowel movements, passing gas, not related to food.  Hasn't tried any OTC meds.   Back pain--hurts more when getting up from lying down, feels like something heavy is holding her down.  Heat doesn't help. She is also complaining of a numbness down her thighs to her knees.  Not painful.  Denies any weakness in the legs.  LMP was 8/5.  She takes ibuprofen 800mg  TID throughout her period due to pain (sometimes more frequently).  She hasn't tried ibuprofen for this pain, as pain started after her last period ended.  Some sweats, but no fevers.  Has some nausea, not related to meals, sometimes just with standing, not constant.  Denies heartburn.  Denies vomiting.  Sometimes has constipation (goes days without BM, hard to pass, straining).  Last BM was today, normal, no straining.  Pain is unchanged.  Denies black or bloody stools.    Past Medical History  Diagnosis Date  . GERD (gastroesophageal reflux disease)   . Hypertension   . Fibroid uterus     GYN--Dr. Rivard  . Recurrent upper respiratory infection (URI)     in October - tx with abx  . Anemia     on iron  . Back pain     tx with ibuprofen 800mg   . Cough     non-productive  . Bronchitis     treated recently by abx  . Infertility, female   . DUB (dysfunctional uterine bleeding)   . Type II or  unspecified type diabetes mellitus without mention of complication, not stated as uncontrolled     diagnosed by Dr. Loleta Chance   Past Surgical History  Procedure Laterality Date  . Myomectomy  2002  . Endometrial vaporization w/ versapoint     History   Social History  . Marital Status: Married    Spouse Name: N/A    Number of Children: N/A  . 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 Smokeless tobacco: Never Used  . Alcohol Use: Yes     Comment: socially 1-2 drinks, once a  month (mixed drinks)  . Drug Use: No  . Sexual Activity: Yes    Birth Control/ Protection: None   Other Topics Concern  . Not on file   Social History Narrative  . No narrative on file   Current outpatient prescriptions:ibuprofen (ADVIL,MOTRIN) 800 MG tablet, Take 800 mg by mouth every 8 (eight) hours as needed for pain (pain related to periods)., Disp: , Rfl: ;  linagliptin (TRADJENTA) 5 MG TABS tablet, Take 5 mg by mouth daily., Disp: , Rfl: ;  albuterol (PROVENTIL HFA;VENTOLIN HFA) 108 (90 BASE) MCG/ACT inhaler, Inhale 2 puffs into the lungs every 6 (six)  hours as needed for wheezing., Disp: 1 Inhaler, Rfl: 0 ciprofloxacin (CIPRO) 500 MG tablet, Take 1 tablet (500 mg total) by mouth 2 (two) times daily., Disp: 14 tablet, Rfl: 0;  esomeprazole (NEXIUM) 40 MG capsule, Take 1 capsule (40 mg total) by mouth daily., Disp: 10 capsule, Rfl: 0;  hydrocortisone (ANUSOL-HC) 2.5 % rectal cream, Place rectally 2 (two) times daily. Use as needed for hemorrhoid and itching, Disp: 30 g, Rfl: 1 Multiple Vitamin (MULTIVITAMIN WITH MINERALS) TABS, Take 1 tablet by mouth daily., Disp: , Rfl:  onglyza and diovan HCT were stopped by Dr. Loleta Chance.  Changed to Tradjenta and another BP medication--she doesn't recall the name.  meds were changed about 2 months ago.  Allergies  Allergen Reactions  . Penicillins Shortness Of Breath  and Swelling  . Ampicillin   . Cephalosporins Itching  . Nitrofuran Derivatives Itching    Throat, mouth, hands itch   ROS:  Denies fevers, nausea, vomiting.  See HPI for GI complaints, back pain.  She has had chronic dry cough, for which she had CT in July (normal).  She denies heartburn/reflux, stating that she had endoscopy which didn't show that in past.  No urinary complaints, vaginal discharge, abnormal vaginal bleeding.  No chest pain, palpitations, URI symptoms, shortness of breath, bleeding/brusiing/rashes or other complaints except as noted in HPI.  PHYSICAL EXAM: BP 102/60  Pulse 80  Temp(Src) 98.6 F (37 C) (Oral)  Ht 5\' 4"  (1.626 m)  Wt 181 lb (82.101 kg)  BMI 31.05 kg/m2  LMP 02/06/2013 Pleasant, well-appearing female in no distress Neck: no lymphadenopathy or mass Heart: regular rate and rhythm without murmur Lungs: clear bilaterally Back: +bilateral tenderness at CVA (mild).  nontender at paraspinous muscles in this area, but IS tender over lower lumbar paraspinous muscles, bilaterally. Abdomen:  Soft.  Normal bowel sounds.  She is tender diffusely--epigastrium, RUQ, but extending pretty far laterally (soft tissue), and tender in LUQ, and across entire lower abdomen, RLQ moreso than left.  Tender suprapubically also. Extremities: no edema Skin: no rash Neuro: alert and oriented.  Cranial nerves grossly intact, normal gait  Urine:  Trace LE, 1+ blood  ASSESSMENT/PLAN:  LBP (low back pain) - Plan: POCT Urinalysis Dipstick, Urine culture  Abdominal pain, unspecified site - Plan: Urine culture, POCT urine pregnancy  Abdominal pain, epigastric - Plan: esomeprazole (NEXIUM) 40 MG capsule  Urinary tract infection, site not specified - Plan: ciprofloxacin (CIPRO) 500 MG tablet  Back and abdominal pain. Given abnormal u/a, pain over bladder and kidneys, will treat presumptively for UTI and send urine for culture.  (If culture is negative and symptoms aren't improved,  can stop the ABX).  Appears to also have a musculoskeletal component.  Continue with warm compresses/heat.  Try ibuprofen 800 mg three times daily with food.  Also PPI--given epigastric pain (and might help if reflux is contributing to chronic dry cough), especially while on NSAID  Counseled re: risks/side effects of meds  Return in 5-7 days if symptoms persist/worsen.  Return sooner if having fevers, worsening pain, vomiting.  25 min visit

## 2013-03-01 LAB — URINE CULTURE: Colony Count: NO GROWTH

## 2013-03-02 NOTE — Progress Notes (Signed)
LMOM TO CB. CLS 

## 2013-03-07 ENCOUNTER — Telehealth: Payer: Self-pay | Admitting: Family Medicine

## 2013-03-07 NOTE — Telephone Encounter (Signed)
Spoke with patient and she was given instructions this past Friday to stop Cipro after 48hrs if her pain had not improved. She did stop after the 48 hrs because her pain had not improved. She called and stated that her lbp and abdominal pain has not stopped and has actually worsened since she was seen last Thursday. She would like to know what she should do. Please advise. Thanks.

## 2013-03-07 NOTE — Telephone Encounter (Signed)
This is what was discussed at her visit:  Given abnormal u/a, pain over bladder and kidneys, will treat presumptively for UTI and send urine for culture. (If culture is negative and symptoms aren't improved, can stop the ABX).  Appears to also have a musculoskeletal component. Continue with warm compresses/heat. Try ibuprofen 800 mg three times daily with food. Also PPI--given epigastric pain (and might help if reflux is contributing to chronic dry cough), especially while on NSAID  Counseled re: risks/side effects of meds  Return in 5-7 days if symptoms persist/worsen. Return sooner if having fevers, worsening pain, vomiting.   She should schedule f/u if she isn't better, and has been doing the measures above.

## 2013-03-07 NOTE — Telephone Encounter (Signed)
Patient advised of Dr.Knapp's recommendations. 

## 2013-03-07 NOTE — Telephone Encounter (Signed)
Pt called and wants to talk with you about her pain and stopping meds   [phone]

## 2013-03-13 ENCOUNTER — Encounter: Payer: Self-pay | Admitting: Internal Medicine

## 2013-03-13 ENCOUNTER — Ambulatory Visit (INDEPENDENT_AMBULATORY_CARE_PROVIDER_SITE_OTHER): Payer: Managed Care, Other (non HMO) | Admitting: Internal Medicine

## 2013-03-13 VITALS — BP 120/82 | HR 71 | Temp 98.0°F | Ht 64.0 in | Wt 184.6 lb

## 2013-03-13 DIAGNOSIS — R05 Cough: Secondary | ICD-10-CM

## 2013-03-13 NOTE — Patient Instructions (Addendum)
Cough remains unexplained Have methacholine challenge test REturn to see me after methacoline challenge to review results and do a treatment plan

## 2013-03-13 NOTE — Assessment & Plan Note (Signed)
First I need to rule out cough variant asthma. I will get methacholine challenge test and then I will reconvene with the patient. Am concerned that this cough is actually multifactorial

## 2013-03-13 NOTE — Addendum Note (Signed)
Addended by: Darrell Jewel on: 03/13/2013 05:26 PM   Modules accepted: Orders

## 2013-03-13 NOTE — Progress Notes (Signed)
Subjective:    Patient ID: Michelle Mack, female    DOB: 1965/07/19, 47 y.o.   MRN: [id number]  HPI IOV 06/11/2011  47 year female. Cough for 9 months. PMD is Dr Joselyn Arrow (formerly PCP Dr Parke Simmers). Referred by Dr Arty Baumgartner. 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   Insidious onset of cough for 9 months. INnitially PMD Dr Parke Simmers considered GERD etiology. Starrted on Rx for GERD. Then referred to Dr Loreta Ave who she has been seeing for several months. PAtient states s/p BRAVO study last week (done off dexilant for 3 days) and reportedly normal. Dexilant was not helpful. Coughs all the time, intermittently. Rates severity as mild. Considers quality: barking, laryngeal cough. Made worse after dinner or feeling hot - at this time she feels it is bronchitis. Smoke, dust, cold air have no impact on cough. Made better by unclear. Dry cough. No nocturnal cough. CT chest June 2012 - normal (personally reviewed). Does not use humidifier. No mold in the house. Denies sinus drainage. Denies currently tickle in throat. On ARB for many years. Has Dulera past week but not tried it. Takes once month advil for ovarian cycles prn. Does admit to wheeze x 3 days. No dyspnea.   RSI score is 11 - level 5 cough after lying down, level 3 sensation of lump in throat, level 2 - choking, level 1 - hoarse voice.    No family hx of asthma. Spirometry today is normal.  REC Unclear why you are coughing  Please do spirometry here today  Please have methacholine challenge test and then come back to follow with me    OV 03/13/2013 Followup chronic cough. I have not seen her since December 2012; coming up 22 months since last visit.  At that time spirometry was normal and I recommended a methacholine challenge test. However, she did not follow through with that. She says in the past almost 2 years cough persists unchanged. It is dry. It is present all day. Rates it as mild to moderate and severe edema. She  is not sure exactly why she has not followed up but she says that she did have autoimmune evaluation for chronic cough and saw Dr. Zenovia Jordan in rheumatology and her lupus titers were elevated but SLE has been ruled out. She says she also saw the cardiologist Dr. Viann Fish for chronic cough and recollect having had a cardiac stress test that was normal. Cardiologist ordered  CT chest 01/16/2013 that showed normal lung parenchyma ; I personally review this film  RSI cough score today is 27 and reflects irritable larynx syndrome. Cough differentiator score shows reflux as a cause for cough but she says this is been ruled out but gastroenterology   Dr Gretta Cool Reflux Symptom Index (> 13-15 suggestive of LPR cough) 03/13/2013   Hoarseness of problem with voice 3  Clearing  Of Throat 4  Excess throat mucus or feeling of post nasal drip 0  Difficulty swallowing food, liquid or tablets 4  Cough after eating or lying down 5  Breathing difficulties or choking episodes 5  Troublesome or annoying cough 5  Sensation of something sticking in throat or lump in throat 0  Heartburn, chest pain, indigestion, or stomach acid coming up 1  TOTAL 27     Kouffman Reflux v Neurogenic Cough Differentiator Reflux 03/13/2013   Do you awaken from a sound sleep coughing violently?  With trouble breathing? Yes. sometmes  Do you have choking episodes when you cannot  Get enough air, gasping for air ?              n  Do you usually cough when you lie down into  The bed, or when you just lie down to rest ?                          Yes  Do you usually cough after meals or eating?         Yes  Do you cough when (or after) you bend over?    No  GERD SCORE  3  Kouffman Reflux v Neurogenic Cough Differentiator Neurogenic  Do you more-or-less cough all day long? n  Does change of temperature make you cough? n  Does laughing or chuckling cause you to cough? y  Do fumes (perfume, automobile  fumes, burned  Toast, etc.,) cause you to cough ?      n  Does speaking, singing, or talking on the phone cause you to cough   ?               n  Neurogenic/Airway score 1     Review of Systems  Constitutional: Negative for fever and unexpected weight change.  HENT: Negative for ear pain, nosebleeds, congestion, sore throat, rhinorrhea, sneezing, trouble swallowing, dental problem, postnasal drip and sinus pressure.   Eyes: Negative for redness and itching.  Respiratory: Positive for cough, shortness of breath and wheezing. Negative for chest tightness.   Cardiovascular: Negative for palpitations and leg swelling.  Gastrointestinal: Negative for nausea and vomiting.  Genitourinary: Negative for dysuria.  Musculoskeletal: Negative for joint swelling.  Skin: Negative for rash.  Neurological: Negative for headaches.  Hematological: Does not bruise/bleed easily.  Psychiatric/Behavioral: Negative for dysphoric mood. The patient is not nervous/anxious.        Objective:   Physical Exam Vitals reviewed. Constitutional: She is oriented to person, place, and time. She appears well-developed and well-nourished. No distress.       overweight  HENT:  Head: Normocephalic and atraumatic.  Right Ear: External ear normal.  Left Ear: External ear normal.  Mouth/Throat: Oropharynx is clear and moist. No oropharyngeal exudate.  Eyes: Conjunctivae and EOM are normal. Pupils are equal, round, and reactive to light. Right eye exhibits no discharge. Left eye exhibits no discharge. No scleral icterus.  Neck: Normal range of motion. Neck supple. No JVD present. No tracheal deviation present. No thyromegaly present.  Cardiovascular: Normal rate, regular rhythm, normal heart sounds and intact distal pulses.  Exam reveals no gallop and no friction rub.   No murmur heard. Pulmonary/Chest: Effort normal and breath sounds normal. No respiratory distress. She has no wheezes. She has no rales. She exhibits no  tenderness.  Abdominal: Soft. Bowel sounds are normal. She exhibits no distension and no mass. There is no tenderness. There is no rebound and no guarding.  Musculoskeletal: Normal range of motion. She exhibits no edema and no tenderness.  Lymphadenopathy:    She has no cervical adenopathy.  Neurological: She is alert and oriented to person, place, and time. She has normal reflexes. No cranial nerve deficit. She exhibits normal muscle tone. Coordination normal.  Skin: Skin is warm and dry. No rash noted. She is not diaphoretic. No erythema. No pallor.  Psychiatric: She has a normal mood and affect. Her behavior is normal. Judgment and thought content normal.  Assessment & Plan:

## 2013-03-22 ENCOUNTER — Encounter (HOSPITAL_COMMUNITY): Payer: Managed Care, Other (non HMO)

## 2013-03-28 ENCOUNTER — Ambulatory Visit (HOSPITAL_COMMUNITY)
Admission: RE | Admit: 2013-03-28 | Discharge: 2013-03-28 | Disposition: A | Discharge status: Home / Self Care | Payer: Managed Care, Other (non HMO) | Source: Ambulatory Visit | Attending: Internal Medicine | Admitting: Internal Medicine

## 2013-03-28 ENCOUNTER — Telehealth: Payer: Self-pay | Admitting: Internal Medicine

## 2013-03-28 DIAGNOSIS — R05 Cough: Secondary | ICD-10-CM

## 2013-03-28 DIAGNOSIS — R0989 Other specified symptoms and signs involving the circulatory and respiratory systems: Secondary | ICD-10-CM | POA: Insufficient documentation

## 2013-03-28 DIAGNOSIS — R059 Cough, unspecified: Secondary | ICD-10-CM | POA: Insufficient documentation

## 2013-03-28 DIAGNOSIS — R0609 Other forms of dyspnea: Secondary | ICD-10-CM | POA: Insufficient documentation

## 2013-03-28 NOTE — Telephone Encounter (Signed)
Returning call can be reached at [phone].Raylene Everts

## 2013-03-28 NOTE — Telephone Encounter (Signed)
Still awaiting msg back from MR

## 2013-03-28 NOTE — Telephone Encounter (Signed)
Pt called back and scheduled appt for next month, wants to know if she should be using an asthma pump in the mean time.Michelle Mack

## 2013-03-28 NOTE — Telephone Encounter (Signed)
Spoke with the pt She states that MR advised her to call for appt to review MCT and she is coming in Oct, but wants to know if she should start using maint inhaler in the meantime Please advise, thanks!

## 2013-03-29 MED ORDER — MOMETASONE FURO-FORMOTEROL FUM 100-5 MCG/ACT IN AERO: 2.0000 | INHALATION_SPRAY | Freq: Two times a day (BID) | RESPIRATORY_TRACT | Status: DC

## 2013-03-29 MED ORDER — PREDNISONE 10 MG PO TABS: ORAL_TABLET | ORAL | Status: DC

## 2013-03-29 NOTE — Telephone Encounter (Signed)
Spirometry Fev1 1.38L/56%, FVC 3L/100%, RAtio 46. Obstruction moderate.  On fV loop lot of cough. ? VCD  Please Have her start Please take Take prednisone 40mg  once daily x 3 days, then 30mg  once daily x 3 days, then 20mg  once daily x 3 days, then prednisone 10mg  once daily  x 3 days and stop  Also, come in and take sample dulera 100/5 twice daily  Return to see me 04/18/13 office visit'; spirometry check at that visit   IF she has any concerns in between can see me 04/05/13 instead or see NP anytime  Pleaese apologize on my behalf yesterday: ICU was too busy  Dr. Kalman Shan, M.D., [facility].C.P Pulmonary and Critical Care Medicine Staff Physician [facility] Pager: [phone], If no answer or between  15:00h - 7:00h: call [phone]  03/29/2013 3:03 PM

## 2013-03-29 NOTE — Telephone Encounter (Signed)
Pt aware of recs. She kept appt for 04/05/13. Sample left for pick up and rx sent. Nothing further needed

## 2013-03-29 NOTE — Telephone Encounter (Signed)
I spoke with pt. I advised her we are awaiting to hear back from MR. She voiced her understanding and needed nothing further

## 2013-03-29 NOTE — Telephone Encounter (Signed)
Pt called back & is upset that she did not get a returned phone call from anyone yesterday.  Michelle Mack

## 2013-04-05 ENCOUNTER — Ambulatory Visit (INDEPENDENT_AMBULATORY_CARE_PROVIDER_SITE_OTHER): Payer: Managed Care, Other (non HMO) | Admitting: Internal Medicine

## 2013-04-05 ENCOUNTER — Encounter: Payer: Self-pay | Admitting: Internal Medicine

## 2013-04-05 VITALS — BP 120/82 | HR 74 | Ht 64.0 in | Wt 189.0 lb

## 2013-04-05 DIAGNOSIS — R05 Cough: Secondary | ICD-10-CM

## 2013-04-05 MED ORDER — ALBUTEROL SULFATE HFA 108 (90 BASE) MCG/ACT IN AERS: 2.0000 | INHALATION_SPRAY | RESPIRATORY_TRACT | Status: DC | PRN

## 2013-04-05 NOTE — Progress Notes (Signed)
Quick Note:  MR patient  Ordered under KC by accident  ______

## 2013-04-05 NOTE — Assessment & Plan Note (Signed)
She has new obstructive lung disease since 2012. Despite 7 days prednisne and dulera (though taken  Prn) she is no better subjectively and objetively fev1 only 180cc better. Do not if this is due to improper dulera use or more aggressive inflammatin  PLAN Work on correcting mdi technique and increasing inhaled steroid deposition. So, will increase dulera to 200/5, 2 puff bid and add QVAR 2 puff bid for small particle deposition. MDI technique and care reinforced by me and cMA Continue and finhis out pred taper (another week pending). AT fu check spirometry  CAre with exercise -donot over do    IF no response at followup, consider CPST or VCD RX with gabapentin/speech therapy or/and [facility] referral  > 50% of this > 25 min visit spent in face to face counseling (15 min visit converted to 25 min)

## 2013-04-05 NOTE — Progress Notes (Signed)
Subjective:    Patient ID: Michelle Mack, female    DOB: 1965-08-23, 47 y.o.   MRN: [id number]  HPI  IOV 06/11/2011  47 year female. Cough for 9 months. PMD is Dr Joselyn Arrow (formerly PCP Dr Parke Simmers). Referred by Dr Arty Baumgartner. 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   Insidious onset of cough for 9 months. INnitially PMD Dr Parke Simmers considered GERD etiology. Starrted on Rx for GERD. Then referred to Dr Loreta Ave who she has been seeing for several months. PAtient states s/p BRAVO study last week (done off dexilant for 3 days) and reportedly normal. Dexilant was not helpful. Coughs all the time, intermittently. Rates severity as mild. Considers quality: barking, laryngeal cough. Made worse after dinner or feeling hot - at this time she feels it is bronchitis. Smoke, dust, cold air have no impact on cough. Made better by unclear. Dry cough. No nocturnal cough. CT chest June 2012 - normal (personally reviewed). Does not use humidifier. No mold in the house. Denies sinus drainage. Denies currently tickle in throat. On ARB for many years. Has Dulera past week but not tried it. Takes once month advil for ovarian cycles prn. Does admit to wheeze x 3 days. No dyspnea.   RSI score is 11 - level 5 cough after lying down, level 3 sensation of lump in throat, level 2 - choking, level 1 - hoarse voice.    No family hx of asthma. Spirometry today is normal. 2.08L/85%  REC Unclear why you are coughing  Please do spirometry here today  Please have methacholine challenge test and then come back to follow with me    OV 03/13/2013 Followup chronic cough. I have not seen her since December 2012; coming up 22 months since last visit.  At that time spirometry was normal and I recommended a methacholine challenge test. However, she did not follow through with that. She says in the past almost 2 years cough persists unchanged. It is dry. It is present all day. Rates it as mild to moderate and severe.  She is not sure exactly why she has not followed up but she says that she did have autoimmune evaluation for chronic cough and saw Dr. Zenovia Jordan in rheumatology and her lupus titers were elevated but SLE has been ruled out. She says she also saw the cardiologist Dr. Viann Fish for chronic cough and recollect having had a cardiac stress test that was normal. Cardiologist ordered  CT chest 01/16/2013 that showed normal lung parenchyma ; I personally review this film. No Coronary artery calcification either (echo not done per patient)  RSI cough score today is 27 and reflects irritable larynx syndrome. Cough differentiator score shows reflux as a cause for cough but she says this is been ruled out but gastroenterology   REC Methacholine challenge test  OV 04/05/2013 Followup chronic cough after methacholine challenge test  On 03/27/13: She went for methacholine challenge test but this was not even started because baseline spirometry showed : Spirometry Fev1 1.38L/56%, FVC 3L/100%, RAtio 46. Obstruction moderate. On fV loop lot of cough. ? VCD. Therefore on 03/28/2013 she started a 12 day prednisone taper along with Dulera. She is now midway through her prednisone and she says she is compliant with her Elwin Sleight but her cough has not improved at all and it persists unchanged. Discovered she is taking dulera prn basis and she is rinsing her mdi with water after use. However, mdi technique is good thou In  addition, last 3 days she's had some atypical chest pains that have happened at rest at the sternal parasternal region. These are transient and can last any duration from a few seconds to a few minutes and come on randomly without any aggravating or relieving factors. In addition yesterday, she resumed her normal daily walks after a week absence and she noted shortness of breath and a feeling of going to pass out  She is very upset that no diagnosis has been made and treatment prescribed has not helped  her and she is new additional symptoms. She seems to be losing trust in my advice  Spirometry toay fev1 1.56L/65 % and ratio 50% - moderate obstruction. 180cc improvement in fev1 only (done wtiih patient compliant on prednisone taper but takign dulera prn and wrong technique)   Dr Gretta Cool Reflux Symptom Index (> 13-15 suggestive of LPR cough) 03/13/2013   Hoarseness of problem with voice 3  Clearing  Of Throat 4  Excess throat mucus or feeling of post nasal drip 0  Difficulty swallowing food, liquid or tablets 4  Cough after eating or lying down 5  Breathing difficulties or choking episodes 5  Troublesome or annoying cough 5  Sensation of something sticking in throat or lump in throat 0  Heartburn, chest pain, indigestion, or stomach acid coming up 1  TOTAL 27     Kouffman Reflux v Neurogenic Cough Differentiator Reflux 03/13/2013   Do you awaken from a sound sleep coughing violently?                            With trouble breathing? Yes. sometmes  Do you have choking episodes when you cannot  Get enough air, gasping for air ?              n  Do you usually cough when you lie down into  The bed, or when you just lie down to rest ?                          Yes  Do you usually cough after meals or eating?         Yes  Do you cough when (or after) you bend over?    No  GERD SCORE  3  Kouffman Reflux v Neurogenic Cough Differentiator Neurogenic  Do you more-or-less cough all day long? n  Does change of temperature make you cough? n  Does laughing or chuckling cause you to cough? y  Do fumes (perfume, automobile fumes, burned  Toast, etc.,) cause you to cough ?      n  Does speaking, singing, or talking on the phone cause you to cough   ?               n  Neurogenic/Airway score 1     Review of Systems  Constitutional: Negative for fever and unexpected weight change.  HENT: Negative for ear pain, nosebleeds, congestion, sore throat, rhinorrhea, sneezing, trouble swallowing,  dental problem, postnasal drip and sinus pressure.   Eyes: Negative for redness and itching.  Respiratory: Positive for cough. Negative for chest tightness, shortness of breath and wheezing.   Cardiovascular: Positive for chest pain. Negative for palpitations and leg swelling.  Gastrointestinal: Negative for nausea and vomiting.  Genitourinary: Negative for dysuria.  Musculoskeletal: Negative for joint swelling.  Skin: Negative for rash.  Neurological: Negative for headaches.  Hematological: Does not  bruise/bleed easily.  Psychiatric/Behavioral: Negative for dysphoric mood. The patient is not nervous/anxious.        Objective:   Physical Exam   Vitals reviewed. Constitutional: She is oriented to person, place, and time. She appears well-developed and well-nourished. No distress.       overweight  HENT:  Head: Normocephalic and atraumatic.  Right Ear: External ear normal.  Left Ear: External ear normal.  Mouth/Throat: Oropharynx is clear and moist. No oropharyngeal exudate.  Eyes: Conjunctivae and EOM are normal. Pupils are equal, round, and reactive to light. Right eye exhibits no discharge. Left eye exhibits no discharge. No scleral icterus.  Neck: Normal range of motion. Neck supple. No JVD present. No tracheal deviation present. No thyromegaly present.  Cardiovascular: Normal rate, regular rhythm, normal heart sounds and intact distal pulses.  Exam reveals no gallop and no friction rub.   No murmur heard. Pulmonary/Chest: Effort normal and breath sounds normal. No respiratory distress. She has no wheezes. She has no rales. She exhibits no tenderness.  Abdominal: Soft. Bowel sounds are normal. She exhibits no distension and no mass. There is no tenderness. There is no rebound and no guarding.  Musculoskeletal: Normal range of motion. She exhibits no edema and no tenderness.  Lymphadenopathy:    She has no cervical adenopathy.  Neurological: She is alert and oriented to person,  place, and time. She has normal reflexes. No cranial nerve deficit. She exhibits normal muscle tone. Coordination normal.  Skin: Skin is warm and dry. No rash noted. She is not diaphoretic. No erythema. No pallor.  Psychiatric: She has a normal mood and affect. Her behavior is normal. Judgment and thought content normal.           Assessment & Plan:

## 2013-04-05 NOTE — Patient Instructions (Addendum)
Continue and finish prednisone taper as scheduled Continue dulera 200/5, 2 puff twice daily - learn technique Start QVAR , 2 puiff twice daily , learn technique STart albuterol as needed, 2 puff as needed REturn in 2 weeks to see me 04/18/13 possible or NP  - spirometry at followup

## 2013-05-09 ENCOUNTER — Ambulatory Visit: Payer: Managed Care, Other (non HMO) | Admitting: Internal Medicine

## 2013-05-16 ENCOUNTER — Ambulatory Visit (INDEPENDENT_AMBULATORY_CARE_PROVIDER_SITE_OTHER): Payer: Managed Care, Other (non HMO) | Admitting: Internal Medicine

## 2013-05-16 ENCOUNTER — Inpatient Hospital Stay (HOSPITAL_COMMUNITY): Payer: Managed Care, Other (non HMO)

## 2013-05-16 ENCOUNTER — Encounter (HOSPITAL_COMMUNITY): Payer: Self-pay

## 2013-05-16 ENCOUNTER — Encounter: Payer: Self-pay | Admitting: Internal Medicine

## 2013-05-16 ENCOUNTER — Inpatient Hospital Stay (HOSPITAL_COMMUNITY)
Admission: AD | Admit: 2013-05-16 | Discharge: 2013-05-16 | Disposition: A | Discharge status: Home / Self Care | Payer: Managed Care, Other (non HMO) | Source: Ambulatory Visit | Attending: Obstetrics and Gynecology | Admitting: Obstetrics and Gynecology

## 2013-05-16 VITALS — BP 120/80 | HR 73 | Ht 64.0 in | Wt 189.6 lb

## 2013-05-16 DIAGNOSIS — R059 Cough, unspecified: Secondary | ICD-10-CM

## 2013-05-16 DIAGNOSIS — R05 Cough: Secondary | ICD-10-CM

## 2013-05-16 DIAGNOSIS — N8000 Endometriosis of the uterus, unspecified: Secondary | ICD-10-CM | POA: Insufficient documentation

## 2013-05-16 DIAGNOSIS — R109 Unspecified abdominal pain: Secondary | ICD-10-CM | POA: Insufficient documentation

## 2013-05-16 DIAGNOSIS — N393 Stress incontinence (female) (male): Secondary | ICD-10-CM | POA: Insufficient documentation

## 2013-05-16 LAB — CBC WITH DIFFERENTIAL/PLATELET
Basophils Absolute: 0 10*3/uL (ref 0.0–0.1)
HCT: 37.7 % (ref 36.0–46.0)
Hemoglobin: 12.5 g/dL (ref 12.0–15.0)
Lymphocytes Relative: 20 % (ref 12–46)
Monocytes Absolute: 0.7 10*3/uL (ref 0.1–1.0)
Monocytes Relative: 8 % (ref 3–12)
Neutro Abs: 5.8 10*3/uL (ref 1.7–7.7)
RDW: 14 % (ref 11.5–15.5)
WBC: 8.3 10*3/uL (ref 4.0–10.5)

## 2013-05-16 LAB — URINALYSIS, ROUTINE W REFLEX MICROSCOPIC
Glucose, UA: NEGATIVE mg/dL
Ketones, ur: NEGATIVE mg/dL
Protein, ur: NEGATIVE mg/dL

## 2013-05-16 LAB — WET PREP, GENITAL
Clue Cells Wet Prep HPF POC: NONE SEEN
Trich, Wet Prep: NONE SEEN

## 2013-05-16 LAB — URINE MICROSCOPIC-ADD ON

## 2013-05-16 MED ORDER — GABAPENTIN 300 MG PO CAPS: ORAL_CAPSULE | ORAL | Status: DC

## 2013-05-16 MED ORDER — KETOROLAC TROMETHAMINE 60 MG/2ML IM SOLN: 30.0000 mg | Freq: Once | INTRAMUSCULAR | Status: DC

## 2013-05-16 NOTE — [facility]U Provider Note (Signed)
History     CSN: [id number]  Arrival date and time: 05/16/13 1191   None     Chief Complaint  Patient presents with  . Abdominal Pain   HPI Pt presented this morning c/o pelvic pain midline and radiating to the left.  No N/V.  Denies h/o kidney stones, fever or chills.  Pt says she came here because she couldn't get into the office until 12/10 with Dr. Estanislado Pandy.  She left last appt she had with Dr. Estanislado Pandy without being seen but had u/s that day which I did not find out about until she had already had u/s here in [facility].  Denies any bleeding and last LMP was 04/28/13.  OB History   Grav Para Term Preterm Abortions TAB SAB Ect Mult Living   5 0 0  4 2 2    0      Past Medical History  Diagnosis Date  . GERD (gastroesophageal reflux disease)   . Hypertension   . Fibroid uterus     GYN--Dr. Rivard  . Recurrent upper respiratory infection (URI)     in October - tx with abx  . Anemia     on iron  . Back pain     tx with ibuprofen 800mg   . Cough     non-productive  . Bronchitis     treated recently by abx  . Infertility, female   . DUB (dysfunctional uterine bleeding)   . Type II or unspecified type diabetes mellitus without mention of complication, not stated as uncontrolled     diagnosed by Dr. Loleta Chance    Past Surgical History  Procedure Laterality Date  . Myomectomy  2002  . Endometrial vaporization w/ versapoint      Family History  Problem Relation Age of Onset  . Hypertension Mother   . Hypertension Father   . Hypertension Sister   . Thyroid nodules Sister   . Diabetes Maternal Grandmother   . Breast cancer Maternal Grandmother   . Heart disease Maternal Grandmother   . Heart disease Paternal Grandmother     History  Substance Use Topics  . 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 Smokeless tobacco: Never Used  . Alcohol Use: Yes     Comment: socially 1-2 drinks, once a  month (mixed drinks)    Allergies:  Allergies  Allergen Reactions  . Ampicillin Shortness  Of Breath  . Penicillins Shortness Of Breath and Swelling  . Cephalosporins Itching  . Nitrofuran Derivatives Itching    Throat, mouth, hands itch    Prescriptions prior to admission  Medication Sig Dispense Refill  . amLODipine (NORVASC) 5 MG tablet Take 5 mg by mouth daily.      Marland Kitchen aspirin 81 MG tablet Take 81 mg by mouth daily.      . fish oil-omega-3 fatty acids 1000 MG capsule Take 1 g by mouth daily.      . hydrochlorothiazide (HYDRODIURIL) 12.5 MG tablet Take 12.5 mg by mouth daily.      Marland Kitchen linagliptin (TRADJENTA) 5 MG TABS tablet Take 5 mg by mouth daily.      . mometasone-formoterol (DULERA) 100-5 MCG/ACT AERO Inhale 2 puffs into the lungs 2 (two) times daily.  1 Inhaler  0  . Multiple Vitamin (MULTIVITAMIN WITH MINERALS) TABS Take 1 tablet by mouth daily.      Marland Kitchen albuterol (PROVENTIL HFA;VENTOLIN HFA) 108 (90 BASE) MCG/ACT inhaler Inhale 2 puffs into the lungs as needed for wheezing or shortness of breath.  1  Inhaler  6    ROS Physical Exam   Blood pressure 116/81, pulse 80, temperature 98.9 F (37.2 C), temperature source Oral, resp. rate 20, height 5\' 4"  (1.626 m), weight 85.73 kg (189 lb), last menstrual period 04/30/2013, SpO2 100.00%.  Physical Exam  Lungs CTA CV RRR Abd soft, NT, no rebound, no guarding,  No BCVAT Spec thick tan d/c, no obvious odor VE no CMT (declined cxs), about 14wk size uterus, mild discomfort  MAU Course  Procedures UPT neg CBC wnl U/S adenomyosis with 16cm uterus UA neg except blood UCx pending Wet prep pending - neg  Assessment and Plan  47yo with adenomyosis and SUI (declining pain medicine here).  Pt will try motrin which helps sometimes.  Will have pt f/u in office to discuss surgical mgmt as pt desires hysterectomy.  Will notify SR.  Will also evaluate for TVT at the same time.  Meshelle Holness Y 05/16/2013, 10:27 AM

## 2013-05-16 NOTE — MAU Note (Signed)
Pt refused IM injection of toradol

## 2013-05-16 NOTE — Patient Instructions (Signed)
#  Chronic cough  - unclear why you are not totally bettter so we need to start broad treatment - need to do treatment below with 100% compliance  - for sinus (empiric)  -  take OTC nasacort inhaler 2 squirts each nostril daily in morning  - take 2.7% hyperteonic saline OTC nasal spray daily at night - for acid reflux (empiric)  - stop fish oil  - start prilosec 20mg OTC once daily on empty stomach in morning  - for asthma  - contiunue dulera 2 puff twice daily  - for Irritable Larynx of Vocal Cord spasm producing wheeze  - Take gabapentin 300mg once daily x 3 days, then 300mg twice daily x 3 days, then 300mg three times daily to continue. If this makes you too sleepy or drowsy call us and we will cut your medication dosing down  #Followup 4-6 weeks with cough score at followup 

## 2013-05-16 NOTE — MAU Note (Signed)
Pt reports pain in mid lower abd that radiates to her left side , x 1 week. Denies nausea, vomiting, fever. Denies dysuria.

## 2013-05-16 NOTE — Progress Notes (Signed)
Subjective:    Patient ID: Michelle Mack, female    DOB: 1965-08-07, 47 y.o.   MRN: [id number]  HPI   IOV 06/11/2011  47 year female. Cough for 9 months. PMD is Dr Joselyn Arrow (formerly PCP Dr Parke Simmers). Referred by Dr Arty Baumgartner. 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   Insidious onset of cough for 9 months. INnitially PMD Dr Parke Simmers considered GERD etiology. Starrted on Rx for GERD. Then referred to Dr Loreta Ave who she has been seeing for several months. PAtient states s/p BRAVO study last week (done off dexilant for 3 days) and reportedly normal. Dexilant was not helpful. Coughs all the time, intermittently. Rates severity as mild. Considers quality: barking, laryngeal cough. Made worse after dinner or feeling hot - at this time she feels it is bronchitis. Smoke, dust, cold air have no impact on cough. Made better by unclear. Dry cough. No nocturnal cough. CT chest June 2012 - normal (personally reviewed). Does not use humidifier. No mold in the house. Denies sinus drainage. Denies currently tickle in throat. On ARB for many years. Has Dulera past week but not tried it. Takes once month advil for ovarian cycles prn. Does admit to wheeze x 3 days. No dyspnea.   RSI score is 11 - level 5 cough after lying down, level 3 sensation of lump in throat, level 2 - choking, level 1 - hoarse voice.    No family hx of asthma. Spirometry today is normal. 2.08L/85%  REC Unclear why you are coughing  Please do spirometry here today  Please have methacholine challenge test and then come back to follow with me    OV 03/13/2013 Followup chronic cough. I have not seen her since December 2012; coming up 22 months since last visit.  At that time spirometry was normal and I recommended a methacholine challenge test. However, she did not follow through with that. She says in the past almost 2 years cough persists unchanged. It is dry. It is present all day. Rates it as mild to moderate and  severe. She is not sure exactly why she has not followed up but she says that she did have autoimmune evaluation for chronic cough and saw Dr. Zenovia Jordan in rheumatology and her lupus titers were elevated but SLE has been ruled out. She says she also saw the cardiologist Dr. Viann Fish for chronic cough and recollect having had a cardiac stress test that was normal.   Cardiologist ordered  CT chest 01/16/2013 that showed normal lung parenchyma ; I personally review this film. No Coronary artery calcification either (echo not done per patient)  RSI cough score today is 27 and reflects irritable larynx syndrome. Cough differentiator score shows reflux as a cause for cough but she says this is been ruled out but gastroenterology   REC Methacholine challenge test  OV 04/05/2013 Followup chronic cough after methacholine challenge test  On 03/27/13: She went for methacholine challenge test but this was not even started because baseline spirometry showed : Spirometry Fev1 1.38L/56%, FVC 3L/100%, RAtio 46. Obstruction moderate. On fV loop lot of cough. ? VCD. Therefore on 03/28/2013 she started a 12 day prednisone taper along with Dulera. She is now midway through her prednisone and she says she is compliant with her Elwin Sleight but her cough has not improved at all and it persists unchanged. Discovered she is taking dulera prn basis and she is rinsing her mdi with water after use. However, mdi technique is  good thou In addition, last 3 days she's had some atypical chest pains that have happened at rest at the sternal parasternal region. These are transient and can last any duration from a few seconds to a few minutes and come on randomly without any aggravating or relieving factors. In addition yesterday, she resumed her normal daily walks after a week absence and she noted shortness of breath and a feeling of going to pass out  She is very upset that no diagnosis has been made and treatment prescribed has  not helped her and she is new additional symptoms. She seems to be losing trust in my advice  Spirometry toay fev1 1.56L/65 % and ratio 50% - moderate obstruction. 180cc improvement in fev1 only (done wtiih patient compliant on prednisone taper but takign dulera prn and wrong technique)    REC Continue and finish prednisone taper as scheduled Continue dulera 200/5, 2 puff twice daily - learn technique Start QVAR , 2 puiff twice daily , learn technique STart albuterol as needed, 2 puff as needed REturn in 2 weeks to see me 04/18/13 possible or NP  - spirometry at followup  OV 05/16/2013   Followup chronic cough  Last visit I emphasized that she take her [facility] diligently. Also introduced supplementary Qvar for small particle effect. She tells me that she has been taking Dulera which is puzzled as to what Qvar is. I'm not so sure that she is compliant with Dulera but she claims she is. Nevertheless her cough she says is only mildly improved but she continues to wheezing. RSI cough score is 22 and shows mild improvement. Of note, she denies any sinus drainage or acid reflux issues. She admits to constant clearing of the throat. She's frustrated and wants quick relief with cough  Past, Family, Social reviewed: no change since last visit    Dr Gretta Cool Reflux Symptom Index (> 13-15 suggestive of LPR cough) 03/13/2013  05/16/2013 After dulera  Hoarseness of problem with voice 3 3  Clearing  Of Throat 4 5  Excess throat mucus or feeling of post nasal drip 0 0  Difficulty swallowing food, liquid or tablets 4 0  Cough after eating or lying down 5 5  Breathing difficulties or choking episodes 5 4  Troublesome or annoying cough 5 5  Sensation of something sticking in throat or lump in throat 0 0  Heartburn, chest pain, indigestion, or stomach acid coming up 1 0  TOTAL 27 22     Kouffman Reflux v Neurogenic Cough Differentiator Reflux 03/13/2013   Do you awaken from a sound sleep  coughing violently?                            With trouble breathing? Yes. sometmes  Do you have choking episodes when you cannot  Get enough air, gasping for air ?              n  Do you usually cough when you lie down into  The bed, or when you just lie down to rest ?                          Yes  Do you usually cough after meals or eating?         Yes  Do you cough when (or after) you bend over?    No  GERD SCORE  3  Kouffman Reflux v Neurogenic  Cough Differentiator Neurogenic  Do you more-or-less cough all day long? n  Does change of temperature make you cough? n  Does laughing or chuckling cause you to cough? y  Do fumes (perfume, automobile fumes, burned  Toast, etc.,) cause you to cough ?      n  Does speaking, singing, or talking on the phone cause you to cough   ?               n  Neurogenic/Airway score 1     Review of Systems  Constitutional: Negative for fever and unexpected weight change.  HENT: Negative for congestion, dental problem, ear pain, nosebleeds, postnasal drip, rhinorrhea, sinus pressure, sneezing, sore throat and trouble swallowing.   Eyes: Negative for redness and itching.  Respiratory: Positive for cough and wheezing. Negative for chest tightness and shortness of breath.   Cardiovascular: Negative for palpitations and leg swelling.  Gastrointestinal: Negative for nausea and vomiting.  Genitourinary: Negative for dysuria.  Musculoskeletal: Negative for joint swelling.  Skin: Negative for rash.  Neurological: Negative for headaches.  Hematological: Does not bruise/bleed easily.  Psychiatric/Behavioral: Negative for dysphoric mood. The patient is not nervous/anxious.        Objective:   Physical Exam  Vitals reviewed. Constitutional: She is oriented to person, place, and time. She appears well-developed and well-nourished. No distress.  Body mass index is 32.53 kg/(m^2).   HENT:  Head: Normocephalic and atraumatic.  Right Ear: External ear normal.   Left Ear: External ear normal.  Mouth/Throat: Oropharynx is clear and moist. No oropharyngeal exudate.  Eyes: Conjunctivae and EOM are normal. Pupils are equal, round, and reactive to light. Right eye exhibits no discharge. Left eye exhibits no discharge. No scleral icterus.  Neck: Normal range of motion. Neck supple. No JVD present. No tracheal deviation present. No thyromegaly present.  Cardiovascular: Normal rate, regular rhythm, normal heart sounds and intact distal pulses.  Exam reveals no gallop and no friction rub.   No murmur heard. Pulmonary/Chest: Effort normal and breath sounds normal. No respiratory distress. She has no wheezes. She has no rales. She exhibits no tenderness.  Abdominal: Soft. Bowel sounds are normal. She exhibits no distension and no mass. There is no tenderness. There is no rebound and no guarding.  Musculoskeletal: Normal range of motion. She exhibits no edema and no tenderness.  Lymphadenopathy:    She has no cervical adenopathy.  Neurological: She is alert and oriented to person, place, and time. She has normal reflexes. No cranial nerve deficit. She exhibits normal muscle tone. Coordination normal.  Skin: Skin is warm and dry. No rash noted. She is not diaphoretic. No erythema. No pallor.  Psychiatric: Her behavior is normal. Judgment and thought content normal.          Assessment & Plan:

## 2013-05-27 NOTE — Assessment & Plan Note (Signed)
#  Chronic cough  - unclear why you are not totally bettter so we need to start broad treatment - need to do treatment below with 100% compliance  - for sinus (empiric)  -  take OTC nasacort inhaler 2 squirts each nostril daily in morning  - take 2.7% hyperteonic saline OTC nasal spray daily at night - for acid reflux (empiric)  - stop fish oil  - start prilosec 20mg  OTC once daily on empty stomach in morning  - for asthma  - contiunue dulera 2 puff twice daily  - for Irritable Larynx of Vocal Cord spasm producing wheeze  - Take gabapentin 300mg  once daily x 3 days, then 300mg  twice daily x 3 days, then 300mg  three times daily to continue. If this makes you too sleepy or drowsy call us and we will cut your medication dosing down  #Followup 4-6 weeks with cough score at followup

## 2013-06-08 ENCOUNTER — Encounter: Payer: Self-pay | Admitting: Family Medicine

## 2013-06-08 ENCOUNTER — Other Ambulatory Visit (INDEPENDENT_AMBULATORY_CARE_PROVIDER_SITE_OTHER): Payer: Managed Care, Other (non HMO)

## 2013-06-08 DIAGNOSIS — Z23 Encounter for immunization: Secondary | ICD-10-CM

## 2013-06-08 DIAGNOSIS — Z111 Encounter for screening for respiratory tuberculosis: Secondary | ICD-10-CM

## 2013-06-11 LAB — TB SKIN TEST: TB Skin Test: NEGATIVE

## 2013-06-21 ENCOUNTER — Emergency Department (HOSPITAL_COMMUNITY)
Admission: EM | Admit: 2013-06-21 | Discharge: 2013-06-21 | Disposition: A | Discharge status: Home / Self Care | Payer: Managed Care, Other (non HMO) | Attending: Emergency Medicine | Admitting: Emergency Medicine

## 2013-06-21 ENCOUNTER — Emergency Department (HOSPITAL_COMMUNITY): Payer: Managed Care, Other (non HMO)

## 2013-06-21 ENCOUNTER — Encounter (HOSPITAL_COMMUNITY): Payer: Self-pay | Admitting: Emergency Medicine

## 2013-06-21 DIAGNOSIS — Z8742 Personal history of other diseases of the female genital tract: Secondary | ICD-10-CM | POA: Insufficient documentation

## 2013-06-21 DIAGNOSIS — Z880 Allergy status to penicillin: Secondary | ICD-10-CM | POA: Insufficient documentation

## 2013-06-21 DIAGNOSIS — Z8719 Personal history of other diseases of the digestive system: Secondary | ICD-10-CM | POA: Insufficient documentation

## 2013-06-21 DIAGNOSIS — J45901 Unspecified asthma with (acute) exacerbation: Secondary | ICD-10-CM | POA: Insufficient documentation

## 2013-06-21 DIAGNOSIS — Z79899 Other long term (current) drug therapy: Secondary | ICD-10-CM | POA: Insufficient documentation

## 2013-06-21 DIAGNOSIS — Z862 Personal history of diseases of the blood and blood-forming organs and certain disorders involving the immune mechanism: Secondary | ICD-10-CM | POA: Insufficient documentation

## 2013-06-21 DIAGNOSIS — E119 Type 2 diabetes mellitus without complications: Secondary | ICD-10-CM | POA: Insufficient documentation

## 2013-06-21 DIAGNOSIS — I10 Essential (primary) hypertension: Secondary | ICD-10-CM | POA: Insufficient documentation

## 2013-06-21 LAB — CBC
HCT: 36.8 % (ref 36.0–46.0)
Hemoglobin: 12.1 g/dL (ref 12.0–15.0)
MCH: 27.3 pg (ref 26.0–34.0)
MCHC: 32.9 g/dL (ref 30.0–36.0)
MCV: 83.1 fL (ref 78.0–100.0)
RDW: 13.6 % (ref 11.5–15.5)

## 2013-06-21 LAB — COMPREHENSIVE METABOLIC PANEL
BUN: 12 mg/dL (ref 6–23)
Calcium: 8.9 mg/dL (ref 8.4–10.5)
Creatinine, Ser: 0.89 mg/dL (ref 0.50–1.10)
GFR calc Af Amer: 89 mL/min — ABNORMAL LOW (ref >90)
GFR calc non Af Amer: 77 mL/min — ABNORMAL LOW (ref >90)
Glucose, Bld: 99 mg/dL (ref 70–99)
Sodium: 137 mEq/L (ref 135–145)
Total Protein: 7.5 g/dL (ref 6.0–8.3)

## 2013-06-21 LAB — POCT I-STAT TROPONIN I: Troponin i, poc: 0.01 ng/mL (ref 0.00–0.08)

## 2013-06-21 LAB — D-DIMER, QUANTITATIVE: D-Dimer, Quant: <0.27 ug/mL-FEU (ref 0.00–0.48)

## 2013-06-21 LAB — PRO B NATRIURETIC PEPTIDE: Pro B Natriuretic peptide (BNP): 109.2 pg/mL (ref 0–125)

## 2013-06-21 MED ORDER — ALBUTEROL (5 MG/ML) CONTINUOUS INHALATION SOLN
10.0000 mg/h | INHALATION_SOLUTION | Freq: Once | RESPIRATORY_TRACT | Status: AC
  Administered 2013-06-21: 10 mg/h via RESPIRATORY_TRACT
  Filled 2013-06-21: qty 40

## 2013-06-21 MED ORDER — PREDNISONE 50 MG PO TABS: 50.0000 mg | ORAL_TABLET | Freq: Every day | ORAL | Status: DC

## 2013-06-21 MED ORDER — IPRATROPIUM BROMIDE 0.02 % IN SOLN
0.5000 mg | Freq: Once | RESPIRATORY_TRACT | Status: AC
  Administered 2013-06-21: 0.5 mg via RESPIRATORY_TRACT
  Filled 2013-06-21: qty 2.5

## 2013-06-21 MED ORDER — METHYLPREDNISOLONE SODIUM SUCC 125 MG IJ SOLR
125.0000 mg | Freq: Once | INTRAMUSCULAR | Status: AC
  Administered 2013-06-21: 125 mg via INTRAVENOUS
  Filled 2013-06-21: qty 2

## 2013-06-21 MED ORDER — ALBUTEROL SULFATE (5 MG/ML) 0.5% IN NEBU
5.0000 mg | INHALATION_SOLUTION | Freq: Once | RESPIRATORY_TRACT | Status: AC
  Administered 2013-06-21: 5 mg via RESPIRATORY_TRACT
  Filled 2013-06-21: qty 1

## 2013-06-21 NOTE — ED Notes (Signed)
Informed Dr. Juleen China of patient's c/o chest pain during ambulation.  Dr. Juleen China states that patient is ok to go home, no new orders at this time, and proceed with discharge.

## 2013-06-21 NOTE — ED Notes (Signed)
Patient denies chest pain and sob at this time.

## 2013-06-21 NOTE — Progress Notes (Signed)
MD ordered peak flow to get an idea of where pt is.  No bronchodilator given with peak flow.  Pt gave good effort and resulted 75lpm with all three attempts.  Will report findings to MD and continue to monitor.

## 2013-06-21 NOTE — ED Notes (Signed)
Vital signs stable. 

## 2013-06-21 NOTE — ED Notes (Addendum)
Pt ambulated three times around nurses station. Her 02 sats ranged from 98 to 100% for a majority of the time. At one point it dipped to 91% but returned to normal limits when the NT told the patient to breath. Upon returning to the pt's room, the O2 sat was 100%. Pt stated she was having chest pain during ambulation.

## 2013-06-21 NOTE — ED Notes (Signed)
Pt states asthma flare up x 3 days. Inhaler not working.

## 2013-06-21 NOTE — ED Provider Notes (Signed)
CSN: [id number]     Arrival date & time 06/21/13  0444 History   First MD Initiated Contact with Patient 06/21/13 [phone]     Chief Complaint  Patient presents with  . Asthma   (Consider location/radiation/quality/duration/timing/severity/associated sxs/prior Treatment) HPI Comments: SUBJECTIVE:  Michelle Mack is a 47 y.o. female seen urgently with exacerbation of asthma for 1 day. Pt was recently diagnosed with Asthma, and is being managed by pulm team. Wheezing is described as moderate. Associated symptoms:dry cough. Patient denies smoke cigarettes.  ROS: See below  Physical Exam The patient appears alert, well appearing, and in no distress, oriented to person, place, and time and acyanotic, in no respiratory distress. ENT: ENT exam normal, no neck nodes or sinus tenderness and neck without nodes CHEST:rhonchi noted - diffusely, no wheezing.   Patient is a 47 y.o. female presenting with asthma. The history is provided by the patient and medical records.  Asthma Pertinent negatives include no chest pain, no abdominal pain, no headaches and no shortness of breath.    Past Medical History  Diagnosis Date  . GERD (gastroesophageal reflux disease)   . Hypertension   . Fibroid uterus     GYN--Dr. Rivard  . Recurrent upper respiratory infection (URI)     in October - tx with abx  . Anemia     on iron  . Back pain     tx with ibuprofen 800mg   . Cough     non-productive  . Bronchitis     treated recently by abx  . Infertility, female   . DUB (dysfunctional uterine bleeding)   . Type II or unspecified type diabetes mellitus without mention of complication, not stated as uncontrolled     diagnosed by Dr. Loleta Chance   Past Surgical History  Procedure Laterality Date  . Myomectomy  2002  . Endometrial vaporization w/ versapoint     Family History  Problem Relation Age of Onset  . Hypertension Mother   . Hypertension Father   . Hypertension Sister   . Thyroid nodules Sister    . Diabetes Maternal Grandmother   . Breast cancer Maternal Grandmother   . Heart disease Maternal Grandmother   . Heart disease Paternal Grandmother    History  Substance Use Topics  . 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 Smokeless tobacco: Never Used  . Alcohol Use: Yes     Comment: socially 1-2 drinks, once a  month (mixed drinks)   OB History   Grav Para Term Preterm Abortions TAB SAB Ect Mult Living   5 0 0  4 2 2    0     Review of Systems  Constitutional: Positive for activity change. Negative for fever and chills.  Respiratory: Positive for cough and wheezing. Negative for shortness of breath.   Cardiovascular: Negative for chest pain.  Gastrointestinal: Negative for nausea, vomiting and abdominal pain.  Genitourinary: Negative for dysuria.  Musculoskeletal: Negative for neck pain.  Neurological: Negative for headaches.    Allergies  Ampicillin; Penicillins; Cephalosporins; and Nitrofuran derivatives  Home Medications   Current Outpatient Rx  Name  Route  Sig  Dispense  Refill  . albuterol (PROVENTIL HFA;VENTOLIN HFA) 108 (90 BASE) MCG/ACT inhaler   Inhalation   Inhale 2 puffs into the lungs as needed for wheezing or shortness of breath.   1 Inhaler   6   . amLODipine (NORVASC) 5 MG tablet   Oral   Take 5 mg by mouth daily.         Marland Kitchen  hydrochlorothiazide (HYDRODIURIL) 12.5 MG tablet   Oral   Take 12.5 mg by mouth daily.         Marland Kitchen linagliptin (TRADJENTA) 5 MG TABS tablet   Oral   Take 5 mg by mouth daily.         . mometasone-formoterol (DULERA) 100-5 MCG/ACT AERO   Inhalation   Inhale 2 puffs into the lungs 2 (two) times daily.   1 Inhaler   0   . Multiple Vitamin (MULTIVITAMIN WITH MINERALS) TABS   Oral   Take 1 tablet by mouth daily.         Marland Kitchen gabapentin (NEURONTIN) 300 MG capsule      300mg  once daily x 3 days, then 300mg  twice daily x 3 days, then 300mg  three times daily to continue   100 capsule   1    BP 127/76  Pulse 77   Temp(Src) 98.4 F (36.9 C) (Oral)  Resp 24  Ht 5\' 4"  (1.626 m)  Wt 194 lb 3.2 oz (88.089 kg)  BMI 33.32 kg/m2  SpO2 100%  LMP 05/22/2013 Physical Exam  Nursing note and vitals reviewed. Constitutional: She is oriented to person, place, and time. She appears well-developed and well-nourished.  HENT:  Head: Normocephalic and atraumatic.  Eyes: EOM are normal. Pupils are equal, round, and reactive to light.  Neck: Neck supple.  Cardiovascular: Normal rate, regular rhythm and normal heart sounds.   No murmur heard. Pulmonary/Chest: Effort normal. No respiratory distress.  Diffuse rhonchus breath sounds. No active wheezing  Abdominal: Soft. She exhibits no distension. There is no tenderness. There is no rebound and no guarding.  Neurological: She is alert and oriented to person, place, and time.  Skin: Skin is warm and dry.    ED Course  Procedures (including critical care time) Labs Review Labs Reviewed  COMPREHENSIVE METABOLIC PANEL - Abnormal; Notable for the following:    GFR calc non Af Amer 77 (*)    GFR calc Af Amer 89 (*)    All other components within normal limits  CBC  PRO B NATRIURETIC PEPTIDE  D-DIMER, QUANTITATIVE  POCT I-STAT TROPONIN I   Imaging Review Dg Chest 2 View  06/21/2013   CLINICAL DATA:  Asthma, shortness of breath  EXAM: CHEST  2 VIEW  COMPARISON:  Prior CT from 01/16/2013  FINDINGS: The cardiac and mediastinal silhouettes are stable in size and contour, and remain within normal limits.  The lungs are normally inflated. No airspace consolidation, pleural effusion, or pulmonary edema is identified. There is no pneumothorax.  No acute osseous abnormality identified.  IMPRESSION: No active cardiopulmonary disease.   Electronically Signed   By: Rise Mu M.D.   On: 06/21/2013 05:51    EKG Interpretation   None       MDM  No diagnosis found.  Pt comes in with cc of cough. Dry. No fevers. Pt has diffuse rhonchis. Appears to be bronchial  asthma. Dimer is neg. CXR shows no PNA.  WE will order prednisone again. She is not smoking. BNP and Trop are neg, so unlikely to be primary cardiac wheez.  Will d.c with pulm f/u  Derwood Kaplan, MD 06/21/13 [phone]

## 2013-06-26 ENCOUNTER — Encounter: Payer: Self-pay | Admitting: Internal Medicine

## 2013-06-26 ENCOUNTER — Encounter (INDEPENDENT_AMBULATORY_CARE_PROVIDER_SITE_OTHER): Payer: Self-pay

## 2013-06-26 ENCOUNTER — Ambulatory Visit (INDEPENDENT_AMBULATORY_CARE_PROVIDER_SITE_OTHER): Payer: Managed Care, Other (non HMO) | Admitting: Internal Medicine

## 2013-06-26 VITALS — BP 130/80 | HR 74 | Ht 64.0 in | Wt 194.4 lb

## 2013-06-26 DIAGNOSIS — J387 Other diseases of larynx: Secondary | ICD-10-CM

## 2013-06-26 DIAGNOSIS — R05 Cough: Secondary | ICD-10-CM

## 2013-06-26 MED ORDER — LEVOFLOXACIN 500 MG PO TABS: 500.0000 mg | ORAL_TABLET | Freq: Every day | ORAL | Status: DC

## 2013-06-26 MED ORDER — PREDNISONE 10 MG PO TABS: ORAL_TABLET | ORAL | Status: DC

## 2013-06-26 NOTE — Progress Notes (Signed)
Subjective:    Patient ID: Michelle Mack, female    DOB: [birth year], 47 y.o.   MRN: [id number]  HPI    IOV 06/11/2011  47 year female. Cough for 9 months. PMD is Dr [name] (formerly PCP Dr Criss Rosales). Referred by Dr Verdia Kuba. 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 Non-smoker.   Insidious onset of cough for 9 months. INnitially PMD Dr Criss Rosales considered GERD etiology. Starrted on Rx for GERD. Then referred to Dr Collene Mares who she has been seeing for several months. PAtient states s/p BRAVO study last week (done off dexilant for 3 days) and reportedly normal. Dexilant was not helpful. Coughs all the time, intermittently. Rates severity as mild. Considers quality: barking, laryngeal cough. Made worse after dinner or feeling hot - at this time she feels it is bronchitis. Smoke, dust, cold air have no impact on cough. Made better by unclear. Dry cough. No nocturnal cough. CT chest June 2012 - normal (personally reviewed). Does not use humidifier. No mold in the house. Denies sinus drainage. Denies currently tickle in throat. On ARB for many years. Has Dulera past week but not tried it. Takes once month advil for ovarian cycles prn. Does admit to wheeze x 3 days. No dyspnea.   RSI score is 11 - level 5 cough after lying down, level 3 sensation of lump in throat, level 2 - choking, level 1 - hoarse voice.    No family hx of asthma. Spirometry today is normal. 2.08L/85%  REC Unclear why you are coughing  Please do spirometry here today  Please have methacholine challenge test and then come back to follow with me    OV 03/13/2013 Followup chronic cough. I have not seen her since December 2012; coming up 22 months since last visit.  At that time spirometry was normal and I recommended a methacholine challenge test. However, she did not follow through with that. She says in the past almost 2 years cough persists unchanged. It is dry. It is present all day. Rates it as mild to moderate and  severe. She is not sure exactly why she has not followed up but she says that she did have autoimmune evaluation for chronic cough and saw Dr. Gavin Pound in rheumatology and her lupus titers were elevated but SLE has been ruled out. She says she also saw the cardiologist Dr. Tollie Eth for chronic cough and recollect having had a cardiac stress test that was normal.   Cardiologist ordered  CT chest 01/16/2013 that showed normal lung parenchyma ; I personally review this film. No Coronary artery calcification either (echo not done per patient)  RSI cough score today is 27 and reflects irritable larynx syndrome. Cough differentiator score shows reflux as a cause for cough but she says this is been ruled out but gastroenterology   REC Methacholine challenge test  OV 04/05/2013 Followup chronic cough after methacholine challenge test  On 03/27/13: She went for methacholine challenge test but this was not even started because baseline spirometry showed : Spirometry Fev1 1.38L/56%, FVC 3L/100%, RAtio 46. Obstruction moderate. On fV loop lot of cough. ? VCD. Therefore on 03/28/2013 she started a 12 day prednisone taper along with Dulera. She is now midway through her prednisone and she says she is compliant with her Ruthe Mannan but her cough has not improved at all and it persists unchanged. Discovered she is taking dulera prn basis and she is rinsing her mdi with water after use. However, mdi  technique is good thou In addition, last 3 days she's had some atypical chest pains that have happened at rest at the sternal parasternal region. These are transient and can last any duration from a few seconds to a few minutes and come on randomly without any aggravating or relieving factors. In addition yesterday, she resumed her normal daily walks after a week absence and she noted shortness of breath and a feeling of going to pass out  She is very upset that no diagnosis has been made and treatment prescribed has  not helped her and she is new additional symptoms. She seems to be losing trust in my advice  Spirometry toay fev1 1.56L/65 % and ratio 50% - moderate obstruction. 180cc improvement in fev1 only (done wtiih patient compliant on prednisone taper but takign dulera prn and wrong technique)    REC Continue and finish prednisone taper as scheduled Continue dulera 200/5, 2 puff twice daily - learn technique Start QVAR , 2 puiff twice daily , learn technique STart albuterol as needed, 2 puff as needed REturn in 2 weeks to see me 04/18/13 possible or NP  - spirometry at followup  OV 05/16/2013   Followup chronic cough  Last visit I emphasized that she take her [facility] diligently. Also introduced supplementary Qvar for small particle effect. She tells me that she has been taking Dulera which is puzzled as to what Qvar is. I'm not so sure that she is compliant with Dulera but she claims she is. Nevertheless her cough she says is only mildly improved but she continues to wheezing. RSI cough score is 22 and shows mild improvement. Of note, she denies any sinus drainage or acid reflux issues. She admits to constant clearing of the throat. She's frustrated and wants quick relief with cough  Past, Family, Social reviewed: no change since last visit   REC  - sinus: nasal steroid, nasal saline to continue - gerd: stop fish poil, start prilosec - asthma: contnue dulera - VCD: STart gabapentin  OV 06/26/2013     Chief Complaint  Patient presents with  . Cough    follow-up. Pt states cough is worse since last OV.  Pt states that she never started gabapentin due to indications listed on paperwork.     Now here with husband. Last visit 5 weeks ago. REcommended to continue nasal steroid/saline which she is doing, to stop fish oil which she has done but did not start prilosec thinking it was a prescription, continues dulera but did not start neurontin for VCD after reading drug insert that did  not show cough indication (this is despite me counseling her this was off -label)  Now cough is worse. A week or two ago started noticing worsening dyspnea, orthopnea, cough, wheeze. Notices wheeze to improve when she presses on throat. Went to ER 06/21/13 and dc'ed on pred taper for few days which she has finisheed (labs that day reviewed: CXR, CBC, BMET, trop, bNP, d-dimer all normal). HOwever, un-improved since then. OVerall cough is worse. She is upset at me that she is not better. RSI Cough score detailed below is 36 and shows severe symptomatology  Spirometry todayL FEv1 1.77L/73%, RAtio 46, Lot of cough during expirtion. No insp loop available. She denies mul;tiple anestheia attempts or traumat to throat or thyroid issues   Dr Gretta Cool Reflux Symptom Index (> 13-15 suggestive of LPR cough) 03/13/2013  05/16/2013 After dulera 06/26/2013 S/p ER visit 06/21/13  Hoarseness of problem with voice 3 3 5  Clearing  Of Throat 4 5 5   Excess throat mucus or feeling of post nasal drip 0 0 3  Difficulty swallowing food, liquid or tablets 4 0 0  Cough after eating or lying down 5 5 5   Breathing difficulties or choking episodes 5 4 5   Troublesome or annoying cough 5 5 5   Sensation of something sticking in throat or lump in throat 0 0 5  Heartburn, chest pain, indigestion, or stomach acid coming up 1 0 3  TOTAL 27 22 36     Kouffman Reflux v Neurogenic Cough Differentiator Reflux 03/13/2013   Do you awaken from a sound sleep coughing violently?                            With trouble breathing? Yes. sometmes  Do you have choking episodes when you cannot  Get enough air, gasping for air ?              n  Do you usually cough when you lie down into  The bed, or when you just lie down to rest ?                          Yes  Do you usually cough after meals or eating?         Yes  Do you cough when (or after) you bend over?    No  GERD SCORE  3  Kouffman Reflux v Neurogenic Cough Differentiator  Neurogenic  Do you more-or-less cough all day long? n  Does change of temperature make you cough? n  Does laughing or chuckling cause you to cough? y  Do fumes (perfume, automobile fumes, burned  Toast, etc.,) cause you to cough ?      n  Does speaking, singing, or talking on the phone cause you to cough   ?               n  Neurogenic/Airway score 1     Review of Systems  Constitutional: Negative for fever and unexpected weight change.  HENT: Negative for congestion, dental problem, ear pain, nosebleeds, postnasal drip, rhinorrhea, sinus pressure, sneezing, sore throat and trouble swallowing.   Eyes: Negative for redness and itching.  Respiratory: Positive for cough. Negative for chest tightness, shortness of breath and wheezing.   Cardiovascular: Negative for palpitations and leg swelling.  Gastrointestinal: Negative for nausea and vomiting.  Genitourinary: Negative for dysuria.  Musculoskeletal: Negative for joint swelling.  Skin: Negative for rash.  Neurological: Negative for headaches.  Hematological: Does not bruise/bleed easily.  Psychiatric/Behavioral: Negative for dysphoric mood. The patient is not nervous/anxious.    Current outpatient prescriptions:albuterol (PROVENTIL HFA;VENTOLIN HFA) 108 (90 BASE) MCG/ACT inhaler, Inhale 2 puffs into the lungs as needed for wheezing or shortness of breath., Disp: 1 Inhaler, Rfl: 6;  amLODipine (NORVASC) 5 MG tablet, Take 5 mg by mouth daily., Disp: , Rfl: ;  hydrochlorothiazide (HYDRODIURIL) 12.5 MG tablet, Take 12.5 mg by mouth daily., Disp: , Rfl:  linagliptin (TRADJENTA) 5 MG TABS tablet, Take 5 mg by mouth daily., Disp: , Rfl: ;  mometasone-formoterol (DULERA) 100-5 MCG/ACT AERO, Inhale 2 puffs into the lungs 2 (two) times daily., Disp: 1 Inhaler, Rfl: 0;  Multiple Vitamin (MULTIVITAMIN WITH MINERALS) TABS, Take 1 tablet by mouth daily., Disp: , Rfl:  gabapentin (NEURONTIN) 300 MG capsule, 300mg  once daily x 3 days, then 300mg  twice daily  x 3 days, then 300mg  three times daily to continue, Disp: 100 capsule, Rfl: 1      Objective:   Physical Exam  Vitals reviewed. Constitutional: She is oriented to person, place, and time. She appears well-developed and well-nourished. No distress.  Body mass index is 33.35 kg/(m^2).   HENT:  Head: Normocephalic and atraumatic.  Right Ear: External ear normal.  Left Ear: External ear normal.  Mouth/Throat: Oropharynx is clear and moist. No oropharyngeal exudate.  Mild upper airway noise that she poiints to as wheeze Laryngeal quality when doiung spiro  Eyes: Conjunctivae and EOM are normal. Pupils are equal, round, and reactive to light. Right eye exhibits no discharge. Left eye exhibits no discharge. No scleral icterus.  Neck: Normal range of motion. Neck supple. No JVD present. No tracheal deviation present. No thyromegaly present.  Cardiovascular: Normal rate, regular rhythm, normal heart sounds and intact distal pulses.  Exam reveals no gallop and no friction rub.   No murmur heard. Pulmonary/Chest: Effort normal and breath sounds normal. No respiratory distress. She has no wheezes. She has no rales. She exhibits no tenderness.  No distal wheeze during auscultation. Husband and she point to fact that there was wheeze from throat  Abdominal: Soft. Bowel sounds are normal. She exhibits no distension and no mass. There is no tenderness. There is no rebound and no guarding.  Musculoskeletal: Normal range of motion. She exhibits no edema and no tenderness.  Lymphadenopathy:    She has no cervical adenopathy.  Neurological: She is alert and oriented to person, place, and time. She has normal reflexes. No cranial nerve deficit. She exhibits normal muscle tone. Coordination normal.  Skin: Skin is warm and dry. No rash noted. She is not diaphoretic. No erythema. No pallor.  Psychiatric: Judgment and thought content normal.  Frustrated not tbetter          Assessment & Plan:

## 2013-06-26 NOTE — Assessment & Plan Note (Signed)
#  Chronic cough  - unclear why you are not totally bettte - need to do treatment below with 100% compliance  - for sinus (empiric)  -  continue OTC nasacort inhaler 2 squirts each nostril daily in morning  - continue 2.7% hyperteonic saline OTC nasal spray daily at night  - for acid reflux (empiric)  - do not go back to  fish oil  - start prilosec 20mg  OTC once daily on empty stomach in morning  - for asthma  - Take prednisone 40 mg daily x 2 days, then 20mg  daily x 2 days, then 10mg  daily x 2 days, then 5mg  daily x 2 days and stop  -  take levaquin 500mg  once daily  X 5 days  -  contiunue dulera 2 puff twice daily  -  Refer to Dr Aris Georgia for Exhaled Nitric Oxide evaluation  - for Irritable Larynx of Vocal Cord spasm producing wheeze  - let us rule out structural issues  - hold off on  neurontin due to side effect and indication concern  - refer you to ENT   - do CT scan sinus without contrast - for chronic cough  - do CT scan neck with and without contrast - for chronic cough and stridor  #Followup At any time yuou prefer a second opinion, wil work with you on that 4 weeks with cough score at followup   > 50% of this > 40 min visit spent in face to face counseling (15 min visit converted to 45 min)

## 2013-06-26 NOTE — Patient Instructions (Addendum)
#  Chronic cough  - unclear why you are not totally bettte - need to do treatment below with 100% compliance  - for sinus (empiric)  -  continue OTC nasacort inhaler 2 squirts each nostril daily in morning  - continue 2.7% hyperteonic saline OTC nasal spray daily at night  - for acid reflux (empiric)  - do not go back to  fish oil  - start prilosec 20mg  OTC once daily on empty stomach in morning  - for asthma  - Take prednisone 40 mg daily x 2 days, then 20mg  daily x 2 days, then 10mg  daily x 2 days, then 5mg  daily x 2 days and stop  -  take levaquin 500mg  once daily  X 5 days  -  contiunue dulera 2 puff twice daily  -  Refer to Dr Aris Georgia for Exhaled Nitric Oxide evaluation  - for Irritable Larynx of Vocal Cord spasm producing wheeze  - hold off on  neurontin due to side effect and indication concern  - refer you to ENT   - do CT scan sinus without contrast - for chronic cough  - do CT scan neck with and without contrast - for chronic cough and stridor  #Followup At any time yuou prefer a second opinion, wil work with you on that 4 weeks with cough score at followup

## 2013-07-02 ENCOUNTER — Ambulatory Visit (INDEPENDENT_AMBULATORY_CARE_PROVIDER_SITE_OTHER)
Admission: RE | Admit: 2013-07-02 | Discharge: 2013-07-02 | Disposition: A | Discharge status: Home / Self Care | Payer: Managed Care, Other (non HMO) | Source: Ambulatory Visit | Attending: Internal Medicine | Admitting: Internal Medicine

## 2013-07-02 DIAGNOSIS — J387 Other diseases of larynx: Secondary | ICD-10-CM

## 2013-07-02 DIAGNOSIS — R05 Cough: Secondary | ICD-10-CM

## 2013-07-02 MED ORDER — IOHEXOL 300 MG/ML  SOLN
80.0000 mL | Freq: Once | INTRAMUSCULAR | Status: AC | PRN
  Administered 2013-07-02: 80 mL via INTRAVENOUS

## 2013-07-11 ENCOUNTER — Telehealth: Payer: Self-pay | Admitting: Internal Medicine

## 2013-07-11 NOTE — Telephone Encounter (Signed)
CT sinus shows mild chronic siusitis. She should talk about this with ENT  CT neck is normal except mild jaw bone arthritis . Does she grind her teeth at night? She should mention ths to ENT but perhaps d/w PMD and her dentist  Dr. Brand Males, M.D., [facility].C.P Pulmonary and Critical Care Medicine Staff Physician [facility] Pager: [phone], If no answer or between  15:00h - 7:00h: call [phone]  07/12/2013 12:00 AM

## 2013-07-12 NOTE — Telephone Encounter (Signed)
Pt advised of results as stated below. She also requested that this note be faxed to her at [phone]. Note faxed. [place], CMA

## 2013-07-16 ENCOUNTER — Other Ambulatory Visit: Payer: Self-pay

## 2013-07-16 DIAGNOSIS — Z1231 Encounter for screening mammogram for malignant neoplasm of breast: Secondary | ICD-10-CM

## 2013-07-18 ENCOUNTER — Ambulatory Visit
Admission: RE | Admit: 2013-07-18 | Discharge: 2013-07-18 | Disposition: A | Discharge status: Home / Self Care | Payer: Managed Care, Other (non HMO) | Source: Ambulatory Visit

## 2013-07-18 DIAGNOSIS — Z1231 Encounter for screening mammogram for malignant neoplasm of breast: Secondary | ICD-10-CM

## 2013-07-24 ENCOUNTER — Ambulatory Visit (INDEPENDENT_AMBULATORY_CARE_PROVIDER_SITE_OTHER): Payer: Managed Care, Other (non HMO) | Admitting: Internal Medicine

## 2013-07-24 ENCOUNTER — Encounter: Payer: Self-pay | Admitting: Internal Medicine

## 2013-07-24 VITALS — BP 120/80 | HR 77 | Ht 64.0 in | Wt 193.0 lb

## 2013-07-24 DIAGNOSIS — R05 Cough: Secondary | ICD-10-CM

## 2013-07-24 DIAGNOSIS — R053 Chronic cough: Secondary | ICD-10-CM

## 2013-07-24 DIAGNOSIS — R059 Cough, unspecified: Secondary | ICD-10-CM

## 2013-07-24 NOTE — Assessment & Plan Note (Signed)
She likely has LPR cough and is poorly compliant iwht advice. These are 2 likely reasons cough has not improved despite multiple tests, consults, recommendations. She is frustrated. Offered internal 2nd opinion v [facility]; she prefers [place] Unfortunately unable to get your cough better  For sinus  = CT shows some mild sinusitis (Jan 2015)   - continue saline nasal spray and inhaled steroids  - can try chlortrimeton 4mg  at night as needed and sudafed in day time as needed; any side effect stop  For acid reflux (empiric)  - contnue prilosec 20mg  daily  Asthma  - you likely do not have this  - you had normal exhaled nitric oxide at allergist office when off dulera; so agre with stopping dulera  Irritable Larynx of Cyclical cough  - I think this is what is going on based on negative CT chest and neck  - Neurontin is ann option but you did not want do this    OVerall  - recommend referral to [facility] pulmonary for eval of chronic cough and mangament  Others  - talk to your dentis about 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

## 2013-07-24 NOTE — Progress Notes (Signed)
Subjective:    Patient ID: Michelle Mack, female    DOB: [birth year], 48 y.o.   MRN: [id number]  HPI    IOV 06/11/2011  48 year female. Cough for 9 months. PMD is Dr [name] (formerly PCP Dr Criss Rosales). Referred by Dr Verdia Kuba. 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 Non-smoker.   Insidious onset of cough for 9 months. INnitially PMD Dr Criss Rosales considered GERD etiology. Starrted on Rx for GERD. Then referred to Dr Collene Mares who she has been seeing for several months. PAtient states s/p BRAVO study last week (done off dexilant for 3 days) and reportedly normal. Dexilant was not helpful. Coughs all the time, intermittently. Rates severity as mild. Considers quality: barking, laryngeal cough. Made worse after dinner or feeling hot - at this time she feels it is bronchitis. Smoke, dust, cold air have no impact on cough. Made better by unclear. Dry cough. No nocturnal cough. CT chest June 2012 - normal (personally reviewed). Does not use humidifier. No mold in the house. Denies sinus drainage. Denies currently tickle in throat. On ARB for many years. Has Dulera past week but not tried it. Takes once month advil for ovarian cycles prn. Does admit to wheeze x 3 days. No dyspnea.   RSI score is 11 - level 5 cough after lying down, level 3 sensation of lump in throat, level 2 - choking, level 1 - hoarse voice.    No family hx of asthma. Spirometry today is normal. 2.08L/85%  REC Unclear why you are coughing  Please do spirometry here today  Please have methacholine challenge test and then come back to follow with me    OV 03/13/2013 Followup chronic cough. I have not seen her since December 2012; coming up 22 months since last visit.  At that time spirometry was normal and I recommended a methacholine challenge test. However, she did not follow through with that. She says in the past almost 2 years cough persists unchanged. It is dry. It is present all day. Rates it as mild to moderate and  severe. She is not sure exactly why she has not followed up but she says that she did have autoimmune evaluation for chronic cough and saw Dr. Gavin Pound in rheumatology and her lupus titers were elevated but SLE has been ruled out. She says she also saw the cardiologist Dr. Tollie Eth for chronic cough and recollect having had a cardiac stress test that was normal.   Cardiologist ordered  CT chest 01/16/2013 that showed normal lung parenchyma ; I personally review this film. No Coronary artery calcification either (echo not done per patient)  RSI cough score today is 27 and reflects irritable larynx syndrome. Cough differentiator score shows reflux as a cause for cough but she says this is been ruled out but gastroenterology   REC Methacholine challenge test  OV 04/05/2013 Followup chronic cough after methacholine challenge test  On 03/27/13: She went for methacholine challenge test but this was not even started because baseline spirometry showed : Spirometry Fev1 1.38L/56%, FVC 3L/100%, RAtio 46. Obstruction moderate. On fV loop lot of cough. ? VCD. Therefore on 03/28/2013 she started a 12 day prednisone taper along with Dulera. She is now midway through her prednisone and she says she is compliant with her Ruthe Mannan but her cough has not improved at all and it persists unchanged. Discovered she is taking dulera prn basis and she is rinsing her mdi with water after use. However, mdi  technique is good thou In addition, last 3 days she's had some atypical chest pains that have happened at rest at the sternal parasternal region. These are transient and can last any duration from a few seconds to a few minutes and come on randomly without any aggravating or relieving factors. In addition yesterday, she resumed her normal daily walks after a week absence and she noted shortness of breath and a feeling of going to pass out  She is very upset that no diagnosis has been made and treatment prescribed has  not helped her and she is new additional symptoms. She seems to be losing trust in my advice  Spirometry toay fev1 1.56L/65 % and ratio 50% - moderate obstruction. 180cc improvement in fev1 only (done wtiih patient compliant on prednisone taper but takign dulera prn and wrong technique)    REC Continue and finish prednisone taper as scheduled Continue dulera 200/5, 2 puff twice daily - learn technique Start QVAR 55mcg, 2 puiff twice daily , learn technique STart albuterol as needed, 2 puff as needed REturn in 2 weeks to see me 04/18/13 possible or NP  - spirometry at followup  OV 05/16/2013   Followup chronic cough  Last visit I emphasized that she take her [facility] diligently. Also introduced supplementary Qvar for small particle effect. She tells me that she has been taking Dulera which is puzzled as to what Qvar is. I'm not so sure that she is compliant with Dulera but she claims she is. Nevertheless her cough she says is only mildly improved but she continues to wheezing. RSI cough score is 22 and shows mild improvement. Of note, she denies any sinus drainage or acid reflux issues. She admits to constant clearing of the throat. She's frustrated and wants quick relief with cough  Past, Family, Social reviewed: no change since last visit   REC  - sinus: nasal steroid, nasal saline to continue - gerd: stop fish poil, start prilosec - asthma: contnue dulera - VCD: STart gabapentin  OV 06/26/2013     Chief Complaint  Patient presents with  . Cough    follow-up. Pt states cough is worse since last OV.  Pt states that she never started gabapentin due to indications listed on paperwork.     Now here with husband. Last visit 5 weeks ago. REcommended to continue nasal steroid/saline which she is doing, to stop fish oil which she has done but did not start prilosec thinking it was a prescription, continues dulera but did not start neurontin for VCD after reading drug insert that did  not show cough indication (this is despite me counseling her this was off -label)  Now cough is worse. A week or two ago started noticing worsening dyspnea, orthopnea, cough, wheeze. Notices wheeze to improve when she presses on throat. Went to ER 06/21/13 and dc'ed on pred taper for few days which she has finisheed (labs that day reviewed: CXR, CBC, BMET, trop, bNP, d-dimer all normal). HOwever, un-improved since then. OVerall cough is worse. She is upset at me that she is not better. RSI Cough score detailed below is 36 and shows severe symptomatology. She and husband point to throat as cause of cough  Spirometry todayL FEv1 1.77L/73%, RAtio 46, Lot of cough during expirtion. No insp loop available. She denies mul;tiple anestheia attempts or traumat to throat or thyroid issues   REC  #Chronic cough  - unclear why you are not totally bettte - need to do treatment below with 100%  compliance  - for sinus (empiric)  -  continue OTC nasacort inhaler 2 squirts each nostril daily in morning  - continue 2.7% hyperteonic saline OTC nasal spray daily at night  - for acid reflux (empiric)  - do not go back to  fish oil  - start prilosec 20mg  OTC once daily on empty stomach in morning  - for asthma  - Take prednisone 40 mg daily x 2 days, then 20mg  daily x 2 days, then 10mg  daily x 2 days, then 5mg  daily x 2 days and stop  -  take levaquin 500mg  once daily  X 5 days  -  contiunue dulera 2 puff twice daily  -  Refer to Dr Hardie Pulley for Exhaled Nitric Oxide evaluation  - for Irritable Larynx of Vocal Cord spasm producing wheeze  - hold off on  neurontin due to side effect and indication concern  - refer you to ENT   - do CT scan sinus without contrast - for chronic cough  - do CT scan neck with and without contrast - for chronic cough and stridor  #Followup At any time yuou prefer a second opinion, wil work with you on that 4 weeks with cough score at followup   [place] 07/24/2013  Chief  Complaint  Patient presents with  . Cough    follow-up. Pt states cough is the same.    FU chronic cough. Result review. COugh not better. She is frustrated that cough not better at all despite multiple test and interventinos. CMA says that she does not know her meds and is not compliant. RSI cough score is still 37 and not better. PRdnisone burst at last OV did not help   Ct chest July 2014: No pulmonary abnormalities  CT sinus Dec 2014:  shows mild chronic siusitis. She says she saw ENT and they were puzzled why I made referral. I do not have their notes   CT neck Jan 2014:  is normal except mild TMJ arthritis . ADvised to see Dentist; unrelatd to cough  Exhaled NO: She saw Dr Remus Blake. Echalend NO was < 25 (her report is being sent for scan). Per patient this was done off dulera for over week  Current meds: patinet somewhat possibly compliant with nasal steroids. Usnure if she is takig ppi. Not taking dulera x 1 month. Did nto want to try neurotin      Dr Lorenza Cambridge Reflux Symptom Index (> 13-15 suggestive of LPR cough) 03/13/2013  05/16/2013 After dulera 06/26/2013 S/p ER visit 06/21/13 07/24/2013   Hoarseness of problem with voice 3 3 5 5   Clearing  Of Throat 4 5 5 5   Excess throat mucus or feeling of post nasal drip 0 0 3 4  Difficulty swallowing food, liquid or tablets 4 0 0 0  Cough after eating or lying down 5 5 5 5   Breathing difficulties or choking episodes 5 4 5 5   Troublesome or annoying cough 5 5 5 5   Sensation of something sticking in throat or lump in throat 0 0 5 5  Heartburn, chest pain, indigestion, or stomach acid coming up 1 0 3 3  TOTAL 27 22 36 37     Kouffman Reflux v Neurogenic Cough Differentiator Reflux 03/13/2013   Do you awaken from a sound sleep coughing violently?                            With trouble breathing? Yes. sometmes  Do you have choking episodes when you cannot  Get enough air, gasping for air ?              n  Do you usually cough  when you lie down into  The bed, or when you just lie down to rest ?                          Yes  Do you usually cough after meals or eating?         Yes  Do you cough when (or after) you bend over?    No  GERD SCORE  3  Kouffman Reflux v Neurogenic Cough Differentiator Neurogenic  Do you more-or-less cough all day long? n  Does change of temperature make you cough? n  Does laughing or chuckling cause you to cough? y  Do fumes (perfume, automobile fumes, burned  Toast, etc.,) cause you to cough ?      n  Does speaking, singing, or talking on the phone cause you to cough   ?               n  Neurogenic/Airway score 1    Review of Systems  Constitutional: Negative for fever and unexpected weight change.  HENT: Negative for congestion, dental problem, ear pain, nosebleeds, postnasal drip, rhinorrhea, sinus pressure, sneezing, sore throat and trouble swallowing.   Eyes: Negative for redness and itching.  Respiratory: Negative for cough, chest tightness, shortness of breath and wheezing.   Cardiovascular: Negative for palpitations and leg swelling.  Gastrointestinal: Negative for nausea and vomiting.  Genitourinary: Negative for dysuria.  Musculoskeletal: Negative for joint swelling.  Skin: Negative for rash.  Neurological: Negative for headaches.  Hematological: Does not bruise/bleed easily.  Psychiatric/Behavioral: Negative for dysphoric mood. The patient is not nervous/anxious.        Objective:   Physical Exam  Vitals reviewed. Constitutional: She is oriented to person, place, and time. She appears well-developed and well-nourished. No distress.    HENT:  Head: Normocephalic and atraumatic.  Right Ear: External ear normal.  Left Ear: External ear normal.  Mouth/Throat: Oropharynx is clear and moist. No oropharyngeal exudate.  Eyes: Conjunctivae and EOM are normal. Pupils are equal, round, and reactive to light. Right eye exhibits no discharge. Left eye exhibits no discharge.  No scleral icterus.  Neck: Normal range of motion. Neck supple. No JVD present. No tracheal deviation present. No thyromegaly present.  Cardiovascular: Normal rate, regular rhythm, normal heart sounds and intact distal pulses.  Exam reveals no gallop and no friction rub.   No murmur heard. Pulmonary/Chest: Effort normal and breath sounds normal. No respiratory distress. She has no wheezes. She has no rales. She exhibits no tenderness.   Abdominal: Soft. Bowel sounds are normal. She exhibits no distension and no mass. There is no tenderness. There is no rebound and no guarding.  Musculoskeletal: Normal range of motion. She exhibits no edema and no tenderness.  Lymphadenopathy:    She has no cervical adenopathy.  Neurological: She is alert and oriented to person, place, and time. She has normal reflexes. No cranial nerve deficit. She exhibits normal muscle tone. Coordination normal.  Skin: Skin is warm and dry. No rash noted. She is not diaphoretic. No erythema. No pallor.  Psychiatric: Judgment and thought content normal.  Frustrated not tbetter          Assessment & Plan:

## 2013-07-24 NOTE — Patient Instructions (Signed)
Unfortunately unable to get your cough better  For sinus  = CT shows some mild sinusitis (Jan 2015)   - continue saline nasal spray and inhaled steroids  - can try chlortrimeton 4mg  at night as needed and sudafed in day time as needed; any side effect stop  For acid reflux (empiric)  - contnue prilosec 20mg  daily  Asthma  - you likely do not have this  - you had normal exhaled nitric oxide at allergist office when off dulera; so agre with stopping dulera  Irritable Larynx of Cyclical cough  - I think this is what is going on based on negative CT chest and neck  - Neurontin is ann option but you did not want do this    OVerall  - recommend referral to [facility] pulmonary for eval of chronic cough and mangament  Others  - talk to your dentis about TMJ arthritis seen on CT dec 2014  Followup   - as needed

## 2013-07-26 ENCOUNTER — Encounter: Payer: Self-pay | Admitting: Family Medicine

## 2013-07-26 ENCOUNTER — Ambulatory Visit (INDEPENDENT_AMBULATORY_CARE_PROVIDER_SITE_OTHER): Payer: Managed Care, Other (non HMO) | Admitting: Family Medicine

## 2013-07-26 VITALS — BP 120/80 | HR 72 | Temp 98.3°F | Ht 64.0 in | Wt 194.0 lb

## 2013-07-26 DIAGNOSIS — N764 Abscess of vulva: Secondary | ICD-10-CM

## 2013-07-26 DIAGNOSIS — R32 Unspecified urinary incontinence: Secondary | ICD-10-CM

## 2013-07-26 LAB — POCT URINALYSIS DIPSTICK
BILIRUBIN UA: NEGATIVE
GLUCOSE UA: NEGATIVE
KETONES UA: NEGATIVE
Nitrite, UA: NEGATIVE
PROTEIN UA: NEGATIVE
Spec Grav, UA: 1.015
Urobilinogen, UA: NEGATIVE
pH, UA: 5

## 2013-07-26 MED ORDER — SULFAMETHOXAZOLE-TMP DS 800-160 MG PO TABS: 1.0000 | ORAL_TABLET | Freq: Two times a day (BID) | ORAL | Status: DC

## 2013-07-26 NOTE — Patient Instructions (Signed)
Urinary Incontinence Urinary incontinence is the involuntary loss of urine from your bladder. CAUSES  There are many causes of urinary incontinence. They include:  Medicines.  Infections.  Prostatic enlargement, leading to overflow of urine from your bladder.  Surgery.  Neurological diseases.  Emotional factors. SIGNS AND SYMPTOMS Urinary Incontinence can be divided into four types: 1. Urge incontinence. Urge incontinence is the involuntary loss of urine before you have the opportunity to go to the bathroom. There is a sudden urge to void but not enough time to reach a bathroom. 2. Stress incontinence. Stress incontinence is the sudden loss of urine with any activity that forces urine to pass. It is commonly caused by anatomical changes to the pelvis and sphincter areas of your body. 3. Overflow incontinence. Overflow incontinence is the loss of urine from an obstructed opening to your bladder. This results in a backup of urine and a resultant buildup of pressure within the bladder. When the pressure within the bladder exceeds the closing pressure of the sphincter, the urine overflows, which causes incontinence, similar to water overflowing a dam. 4. Total incontinence. Total incontinence is the loss of urine as a result of the inability to store urine within your bladder. DIAGNOSIS  Evaluating the cause of incontinence may require:  A thorough and complete medical and obstetric history.  A complete physical exam.  Laboratory tests such as a urine culture and sensitivities. When additional tests are indicated, they can include:  An ultrasound exam.  Kidney and bladder X-rays.  Cystoscopy. This is an exam of the bladder using a narrow scope.  Urodynamic testing to test the nerve function to the bladder and sphincter areas. TREATMENT  Treatment for urinary incontinence depends on the cause:  For urge incontinence caused by a bacterial infection, antibiotics will be prescribed.  If the urge incontinence is related to medicines you take, your health care provider may have you change the medicine.  For stress incontinence, surgery to re-establish anatomical support to the bladder or sphincter, or both, will often correct the condition.  For overflow incontinence caused by an enlarged prostate, an operation to open the channel through the enlarged prostate will allow the flow of urine out of the bladder. In women with fibroids, a hysterectomy may be recommended.  For total incontinence, surgery on your urinary sphincter may help. An artificial urinary sphincter (an inflatable cuff placed around the urethra) may be required. In women who have developed a hole-like passage between their bladder and vagina (vesicovaginal fistula), surgery to close the fistula often is required. HOME CARE INSTRUCTIONS  Normal daily hygiene and the use of pads or adult diapers that are changed regularly will help prevent odors and skin damage.  Avoid caffeine. It can overstimulate your bladder.  Use the bathroom regularly. Try about every 2 3 hours to go to the bathroom, even if you do not feel the need to do so. Take time to empty your bladder completely. After urinating, wait a minute. Then try to urinate again.  For causes involving nerve dysfunction, keep a log of the medicines you take and a journal of the times you go to the bathroom. SEEK MEDICAL CARE IF:  You experience worsening of pain instead of improvement in pain after your procedure.  Your incontinence becomes worse instead of better. SEE IMMEDIATE MEDICAL CARE IF:  You experience fever or shaking chills.  You are unable to pass your urine.  You have redness spreading into your groin or down into your thighs. MAKE   SURE YOU:   Understand these instructions.   Will watch your condition.  Will get help right away if you are not doing well or get worse. Document Released: 07/29/2004 Document Revised: 04/11/2013 Document  Reviewed: 11/28/2012 [facility] Patient Information 2014 [place].   Kegel Exercises The goal of Kegel exercises is to isolate and exercise your pelvic floor muscles. These muscles act as a hammock that supports the rectum, vagina, small intestine, and uterus. As the muscles weaken, the hammock sags and these organs are displaced from their normal positions. Kegel exercises can strengthen your pelvic floor muscles and help you to improve bladder and bowel control, improve sexual response, and help reduce many problems and some discomfort during pregnancy. Kegel exercises can be done anywhere and at any time. HOW TO PERFORM KEGEL EXERCISES 1. Locate your pelvic floor muscles. To do this, squeeze (contract) the muscles that you use when you try to stop the flow of urine. You will feel a tightness in the vaginal area (women) and a tight lift in the rectal area (men and women). 2. When you begin, contract your pelvic muscles tight for 2 5 seconds, then relax them for 2 5 seconds. This is one set. Do 4 5 sets with a short pause in between. 3. Contract your pelvic muscles for 8 10 seconds, then relax them for 8 10 seconds. Do 4 5 sets. If you cannot contract your pelvic muscles for 8 10 seconds, try 5 7 seconds and work your way up to 8 10 seconds. Your goal is 4 5 sets of 10 contractions each day. Keep your stomach, buttocks, and legs relaxed during the exercises. Perform sets of both short and long contractions. Vary your positions. Perform these contractions 3 4 times per day. Perform sets while you are:   Lying in bed in the morning.  Standing at lunch.  Sitting in the late afternoon.  Lying in bed at night. You should do 40 50 contractions per day. Do not perform more Kegel exercises per day than recommended. Overexercising can cause muscle fatigue. Continue these exercises for for at least 15 20 weeks or as directed by your caregiver. Document Released: 06/07/2012 Document Reviewed:  06/07/2012 [facility] Patient Information 2014 [place], [place].  Continue to use warm compresses to the left genital area.  The antibiotics should help clear up the infection.  Follow up with your gynecologist if this area doesn't resolve/heal completely

## 2013-07-26 NOTE — Progress Notes (Signed)
Chief Complaint  Patient presents with  . Urinary Incontinence    x 3 months. UA did show trace leuks and 2+ blood. Patient states that she has 2 boils, one in her vaginal area and one on her buttock. Used warm compresses last night and they "busted" this am and she thinks the blood in her urine may be from that.    She is having leakage of urine with coughing and sneezing.  In the past she had some problems holding her urine when her bladder was full, but that has improved--by going to the bathroom more frequently, not letting bladder get as full.  Denies dysuria or urgency.  She does void frequently, but drinks lots of fluid.  She had a boil in her vaginal area for the last 3 weeks.  She was using pads to help with the leakage of urine.  She tends to get bumps/boils when her cycle is due, but this time the bump got much larger.  She was using hot compresses, and last night it must have drained--she saw some blood this morning, and looked with a mirror and saw that the boil had drained.  It is feeling better now that it has drained.  Chronic cough--being sent to [facility], was told there wasn't anything else they could do for her.  Past Medical History  Diagnosis Date  . GERD (gastroesophageal reflux disease)   . Hypertension   . Fibroid uterus     GYN--Dr. Rivard  . Recurrent upper respiratory infection (URI)     in October - tx with abx  . Anemia     on iron  . Back pain     tx with ibuprofen 800mg   . Cough     non-productive  . Bronchitis     treated recently by abx  . Infertility, female   . DUB (dysfunctional uterine bleeding)   . Type II or unspecified type diabetes mellitus without mention of complication, not stated as uncontrolled     diagnosed by Dr. Berdine Addison   Past Surgical History  Procedure Laterality Date  . Myomectomy  2002  . Endometrial vaporization w/ versapoint     History   Social History  . Marital Status: Married    Spouse Name: N/A    Number of Children: N/A   . Years of Education: N/A   Occupational History  . 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 Smokeless tobacco: Never Used  . Alcohol Use: Yes     Comment: socially 1-2 drinks, once a  month (mixed drinks)  . Drug Use: No  . Sexual Activity: Yes    Birth Control/ Protection: None   Other Topics Concern  . Not on file   Social History Narrative  . No narrative on file    Current outpatient prescriptions:Multiple Vitamin (MULTIVITAMIN WITH MINERALS) TABS, Take 1 tablet by mouth daily., Disp: , Rfl: ;  amLODipine (NORVASC) 5 MG tablet, Take 5 mg by mouth daily., Disp: , Rfl: ;  hydrochlorothiazide (HYDRODIURIL) 12.5 MG tablet, Take 12.5 mg by mouth daily., Disp: , Rfl: ;  linagliptin (TRADJENTA) 5 MG TABS tablet, Take 5 mg by mouth daily., Disp: , Rfl:   Allergies  Allergen Reactions  . Ampicillin Shortness Of Breath  . Penicillins Shortness Of Breath and Swelling  . Cephalosporins Itching  . Nitrofuran Derivatives Itching    Throat, mouth, hands itch   ROS: No fevers, chills,  nausea, vomiting, diarrhea.  +chronic cough, nonproductive.  Denies vaginal discharge, bowel changes, abdominal pain, flank pain, shortness of breath or other problems, except as per HPI.   PHYSICAL EXAM: BP 120/80  Pulse 72  Temp(Src) 98.3 F (36.8 C) (Oral)  Ht 5\' 4"  (1.626 m)  Wt 194 lb (87.998 kg)  BMI 33.28 kg/m2 Some throat clearing and intermittent deep cough. Pleasant, in no distress  Left labia minora--swollen area somewhat posteriorly on the left, with active serosanguinous drainage.  Mildly tender.  The drainage is external, not internal, but the location of swelling is by the bartholin gland (but opening externally)  No inguinal lymphadenopathy No CVA tenderness or suprapubic tenderness Urine dip 2+ blood, trace leuks (likely contaminant from drainage of cyst/abscess)  ASSESSMENT/PLAN:  Incontinence - stress.  taught  Kegels.  consider PT if not improving.  continue work-up for cough, which clearly is contributing to frequency of leakage - Plan: POCT Urinalysis Dipstick  Abscess of left genital labia - spontaneously draining, but quite large, tender - Plan: sulfamethoxazole-trimethoprim (BACTRIM DS) 800-160 MG per tablet  Stress urinary incontinence Taught kegel's exercise. No symptoms of UTI.  Will use sulfa to treat abscess, (which would also cover any UTI) Continue warm compresses.  Consider referral to PT at [facility] (with Ileana Roup) if not improving with home exercise program  F/u as scheduled (Feb)

## 2013-07-31 ENCOUNTER — Other Ambulatory Visit: Payer: Self-pay | Admitting: Family Medicine

## 2013-07-31 DIAGNOSIS — D649 Anemia, unspecified: Secondary | ICD-10-CM

## 2013-07-31 DIAGNOSIS — E559 Vitamin D deficiency, unspecified: Secondary | ICD-10-CM

## 2013-07-31 DIAGNOSIS — R7301 Impaired fasting glucose: Secondary | ICD-10-CM

## 2013-07-31 DIAGNOSIS — I10 Essential (primary) hypertension: Secondary | ICD-10-CM

## 2013-07-31 DIAGNOSIS — E119 Type 2 diabetes mellitus without complications: Secondary | ICD-10-CM

## 2013-08-09 ENCOUNTER — Encounter: Payer: Self-pay | Admitting: Family Medicine

## 2013-08-09 ENCOUNTER — Ambulatory Visit (INDEPENDENT_AMBULATORY_CARE_PROVIDER_SITE_OTHER): Payer: Managed Care, Other (non HMO) | Admitting: Family Medicine

## 2013-08-09 VITALS — BP 130/82 | HR 76 | Ht 64.0 in | Wt 194.0 lb

## 2013-08-09 DIAGNOSIS — B379 Candidiasis, unspecified: Secondary | ICD-10-CM

## 2013-08-09 DIAGNOSIS — E559 Vitamin D deficiency, unspecified: Secondary | ICD-10-CM

## 2013-08-09 DIAGNOSIS — I10 Essential (primary) hypertension: Secondary | ICD-10-CM

## 2013-08-09 DIAGNOSIS — E119 Type 2 diabetes mellitus without complications: Secondary | ICD-10-CM

## 2013-08-09 DIAGNOSIS — R7301 Impaired fasting glucose: Secondary | ICD-10-CM

## 2013-08-09 LAB — POCT GLYCOSYLATED HEMOGLOBIN (HGB A1C): HEMOGLOBIN A1C: 6.1

## 2013-08-09 MED ORDER — FLUCONAZOLE 150 MG PO TABS: 150.0000 mg | ORAL_TABLET | Freq: Once | ORAL | Status: DC

## 2013-08-09 NOTE — Patient Instructions (Addendum)
Check blood sugars 2 hours after dinner (or before bedtime) once a week, as well as checking in the mornings.  Continue low carb, lowfat diet.  Try and exercise at least 30 minutes daily, and try and lose weight.  Return in 3 months.  If A1c remains <6.5, and fasting sugars remain <120's, then we can likely stay off of medications for diabetes.  Continue to monitor your blood pressure at home.  As long as the urine/kidney test is normal, and your blood pressures remain <130/80, then you can stay off blood pressure medications.  Schedule diabetes eye exam, you need this once yearly.  Recommended 3D mammo next year.  Make sure that after you complete your prescription vitamin D, that you take a supplement (1000 IU at least, or whatever your GYN tells you) every day, longterm (or else it will drop low again).

## 2013-08-09 NOTE — Progress Notes (Signed)
Chief Complaint  Patient presents with  . Advice Only    diagnosed with diabetes by Dr.Hill about a year ago. Over the past 2 months or so she has been having blood sugars in the 90's-100 without taking medication and is afraid to take, thinks she may not truly be diabetic. Also has not taken her bp meds in a month or so as she was getting numbers like 120/66 and having HA's. Also would like her mammogram results while here today. (Did A1C here in office today-6.1)   Signed release forms 3 weeks ago, records haven't been received yet.  She wants to get her hypertension and diabetes addressed here.  She previously has been seeing Dr. Berdine Addison for this.  Diabetes--on meds for about a year, started in 05/2012.  She took metformin, but it caused diarrhea so she was switched to tradjenta which she tolerated better. Sugars ran 60-110, fasting.  She went off the tradjenta about a month ago.  Sugars have remained 90-100 since being off medication.  Last eye exam was over a year ago. Denies excessive thirst. No change in urinary frequency--see discussion last visit.  She has been doing her Kegel exercise, but maybe not as many repetitions as recommended.  She saw GYN for evaluation--is set up for bladder testing and TVT procedure next month.  She is very hesitant about having surgical procedure so soon.  Doesn't recall how high A1c was at her time of diagnosis (I have 6.4 in my records, which doesn't give dx of DM, but no further records have been received--awaiting).  Hypertension: She stopped taking her BP medications also about a month ago.  110-120/60 is what BP was running while on the medication.  BP's at home since off amlodipine have been 116-120/66.  She had been having headaches when her blood pressure was running low, when on the medication.  Denies any dizziness. No headaches since being off the medication.  Hasn't had any urine tests through Dr. Berdine Addison in over a year.  She saw her GYN after her last visit  here--hadn't been taking vitamin D at that time.  Vitamin D was low, and she was put back on rx vitamin D x 3 months.  Boil is doing much better, completed the antibiotics, but is now complaining of yeast infection.. Having vaginal discharge, itching and some external burning.   Past Medical History  Diagnosis Date  . GERD (gastroesophageal reflux disease)   . Hypertension   . Fibroid uterus     GYN--Dr. Rivard  . Recurrent upper respiratory infection (URI)     in October - tx with abx  . Anemia     on iron  . Back pain     tx with ibuprofen 800mg   . Cough     non-productive  . Bronchitis     treated recently by abx  . Infertility, female   . DUB (dysfunctional uterine bleeding)   . Type II or unspecified type diabetes mellitus without mention of complication, not stated as uncontrolled     diagnosed by Dr. Berdine Addison   Past Surgical History  Procedure Laterality Date  . Myomectomy  2002  . Endometrial vaporization w/ versapoint     History   Social History  . Marital Status: Married    Spouse Name: N/A    Number of Children: N/A  . Years of Education: N/A   Occupational History  . 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 Smoking  status: Never Smoker   . Smokeless tobacco: Never Used  . Alcohol Use: Yes     Comment: socially 1-2 drinks, once a  month (mixed drinks)  . Drug Use: No  . Sexual Activity: Yes    Birth Control/ Protection: None   Other Topics Concern  . Not on file   Social History Narrative  . No narrative on file   Outpatient Encounter Prescriptions as of 08/09/2013  Medication Sig  . fluconazole (DIFLUCAN) 150 MG tablet Take 1 tablet (150 mg total) by mouth once.  . Multiple Vitamin (MULTIVITAMIN WITH MINERALS) TABS Take 1 tablet by mouth daily.  . Vitamin D, Ergocalciferol, (DRISDOL) 50000 UNITS CAPS capsule Take 1 capsule by mouth once a week.  . [DISCONTINUED] amLODipine (NORVASC) 5 MG tablet Take 5 mg by mouth daily.   . [DISCONTINUED] hydrochlorothiazide (HYDRODIURIL) 12.5 MG tablet Take 12.5 mg by mouth daily.  . [DISCONTINUED] linagliptin (TRADJENTA) 5 MG TABS tablet Take 5 mg by mouth daily.  . [DISCONTINUED] sulfamethoxazole-trimethoprim (BACTRIM DS) 800-160 MG per tablet Take 1 tablet by mouth 2 (two) times daily.   Patient is only taking the recently rx'd vitamin D  Allergies  Allergen Reactions  . Ampicillin Shortness Of Breath  . Penicillins Shortness Of Breath and Swelling  . Cephalosporins Itching  . Nitrofuran Derivatives Itching    Throat, mouth, hands itch   ROS:  Denies headaches, dizziness, chest pain, palpitations, shortness of breath, edema, fevers, chills, URI symptoms, GI complaints, dysuria.  +urinary frequency.  No bleeding/bruising or other concerns.  PHYSICAL EXAM: BP 130/82  Pulse 76  Ht 5\' 4"  (1.626 m)  Wt 194 lb (87.998 kg)  BMI 33.28 kg/m2 Well developed, pleasant, obese female in no distress HEENT:  Conjunctiva clear.  ?very mild exophthalmos noted.  OP clear Neck: no lymphadenopathy, thyromegaly or carotid bruit Heart: regular rate and rhythm without  Murmur Lungs: clear bilaterally Abdomen: soft, nontender, no mass Extremities:  No edema. 2+ pulses.  Normal sensation to monofilament.   Thickened, onychomycotic nails, especially right 2nd toe Bunions with callouses overlying bilateraly  Lab Results  Component Value Date   HGBA1C 6.1 08/09/2013    ASSESSMENT/PLAN:  IFG (impaired fasting glucose) - vs diabetes.  I don't have records to know for sure; presume DM based on meds she was taking but will confirm with records - Plan: HgB A1c  Type II or unspecified type diabetes mellitus without mention of complication, not stated as uncontrolled - off meds x 1 month, and sugars seem fine.  okay to remain off.  returnin 3 mos for f/u, A1c.  Schedule eye exam - Plan: Microalbumin / creatinine urine ratio, HM Diabetes Foot Exam  Essential hypertension, benign - BP's are  fine at home off meds, borderline here. Low sodium diet reviewed.  stay off meds.  change to ACEI or ARB if BP's elevated or +microalbuminuria  Yeast infection - after recent ABX - Plan: fluconazole (DIFLUCAN) 150 MG tablet  Unspecified vitamin D deficiency - complete 12 wks of rx per GYN.  discussed importance of longterm OTC supplementation once rx is completed   Check microalbumin today--if elevated, may need to restart BP medication, but not amlodipine.  Would use ACEI or ARB.  Check blood sugars 2 hours after dinner (or before bedtime) once a week, as well as checking in the mornings. Return in 3 months.  If A1c remains <6.5, and fasting sugars remain <120's, then we can likely stay off of medications for diabetes.  Schedule  diabetes eye exam, you need this once yearly.  Mammogram results reviewed. Recommended 3D mammo next year.

## 2013-08-10 ENCOUNTER — Encounter: Payer: Self-pay | Admitting: Podiatrist

## 2013-08-10 ENCOUNTER — Ambulatory Visit (INDEPENDENT_AMBULATORY_CARE_PROVIDER_SITE_OTHER): Payer: Managed Care, Other (non HMO) | Admitting: Podiatrist

## 2013-08-10 ENCOUNTER — Other Ambulatory Visit: Payer: Managed Care, Other (non HMO)

## 2013-08-10 VITALS — BP 122/74 | HR 77 | Resp 18

## 2013-08-10 DIAGNOSIS — M2040 Other hammer toe(s) (acquired), unspecified foot: Secondary | ICD-10-CM

## 2013-08-10 DIAGNOSIS — D649 Anemia, unspecified: Secondary | ICD-10-CM

## 2013-08-10 DIAGNOSIS — E119 Type 2 diabetes mellitus without complications: Secondary | ICD-10-CM

## 2013-08-10 DIAGNOSIS — M7150 Other bursitis, not elsewhere classified, unspecified site: Secondary | ICD-10-CM

## 2013-08-10 DIAGNOSIS — I10 Essential (primary) hypertension: Secondary | ICD-10-CM

## 2013-08-10 DIAGNOSIS — B351 Tinea unguium: Secondary | ICD-10-CM

## 2013-08-10 LAB — CBC WITH DIFFERENTIAL/PLATELET
Basophils Absolute: 0 10*3/uL (ref 0.0–0.1)
Basophils Relative: 0 % (ref 0–1)
EOS ABS: 0.1 10*3/uL (ref 0.0–0.7)
Eosinophils Relative: 1 % (ref 0–5)
HCT: 38.4 % (ref 36.0–46.0)
HEMOGLOBIN: 12.8 g/dL (ref 12.0–15.0)
LYMPHS ABS: 2.2 10*3/uL (ref 0.7–4.0)
LYMPHS PCT: 30 % (ref 12–46)
MCH: 27.7 pg (ref 26.0–34.0)
MCHC: 33.3 g/dL (ref 30.0–36.0)
MCV: 83.1 fL (ref 78.0–100.0)
MONOS PCT: 9 % (ref 3–12)
Monocytes Absolute: 0.7 10*3/uL (ref 0.1–1.0)
NEUTROS ABS: 4.6 10*3/uL (ref 1.7–7.7)
NEUTROS PCT: 60 % (ref 43–77)
PLATELETS: 328 10*3/uL (ref 150–400)
RBC: 4.62 MIL/uL (ref 3.87–5.11)
RDW: 14.7 % (ref 11.5–15.5)
WBC: 7.5 10*3/uL (ref 4.0–10.5)

## 2013-08-10 LAB — COMPREHENSIVE METABOLIC PANEL
ALT: 19 U/L (ref 0–35)
AST: 17 U/L (ref 0–37)
Albumin: 4.3 g/dL (ref 3.5–5.2)
Alkaline Phosphatase: 76 U/L (ref 39–117)
BUN: 10 mg/dL (ref 6–23)
CO2: 30 meq/L (ref 19–32)
Calcium: 9.7 mg/dL (ref 8.4–10.5)
Chloride: 100 mEq/L (ref 96–112)
Creat: 0.86 mg/dL (ref 0.50–1.10)
Glucose, Bld: 97 mg/dL (ref 70–99)
Potassium: 4.3 mEq/L (ref 3.5–5.3)
SODIUM: 139 meq/L (ref 135–145)
TOTAL PROTEIN: 7.4 g/dL (ref 6.0–8.3)
Total Bilirubin: 0.7 mg/dL (ref 0.2–1.2)

## 2013-08-10 LAB — TSH: TSH: 1.113 u[IU]/mL (ref 0.350–4.500)

## 2013-08-10 LAB — LIPID PANEL
CHOLESTEROL: 167 mg/dL (ref 0–200)
HDL: 53 mg/dL (ref >39)
LDL Cholesterol: 94 mg/dL (ref 0–99)
Total CHOL/HDL Ratio: 3.2 Ratio
Triglycerides: 99 mg/dL (ref <150)
VLDL: 20 mg/dL (ref 0–40)

## 2013-08-10 MED ORDER — DICLOFENAC SODIUM 1 % TD GEL: 2.0000 g | Freq: Four times a day (QID) | TRANSDERMAL | Status: DC

## 2013-08-10 NOTE — Progress Notes (Signed)
   Subjective:    Patient ID: Michelle Mack, female    DOB: [birth year], 48 y.o.   MRN: [id number]  HPI Patient presents today with chief complaint of pain on the 2nd toe on right foot.  She relates the nail has become darkened in color and she has noticed it worsening over the last year.  She states the nail has been turning colors and it burns and throbs.  She is diabetic for 1 year of which she states her blood sugar is under good control.   Review of Systems  HENT: Positive for sinus pressure.   Respiratory: Positive for cough, shortness of breath and wheezing.        Difficulty breathing  Gastrointestinal:       Blood in urine   Endocrine: Positive for cold intolerance.  All other systems reviewed and are negative.       Objective:   Physical Exam GENERAL APPEARANCE: Alert, conversant. Appropriately groomed. No acute distress.  VASCULAR: Pedal pulses palpable at 2/4 DP and PT bilateral.  Capillary refill time is immediate to all digits,  Proximal to distal cooling it warm to warm.  Digital hair growth is present bilateral  NEUROLOGIC: sensation is intact epicritically and protectively to 5.07 monofilament at 5/5 sites bilateral.  Light touch is intact bilateral, vibratory sensation intact bilateral, achilles tendon reflex is intact bilateral.  MUSCULOSKELETAL: Moderate bunion deformity is present. Pressure of the hallux on the second toe is noted. Pain at the second toe is present. Mild swelling of the second toe is also noted. Otherwise good muscle, strength, tone and stability are noted.  DERMATOLOGIC: Mild darkish discoloration of the second toe is noted. Despite normal circulation examination. The nail itself is darkening and discoloration is thickened and dystrophic. Probable mycotic infection is present.     Assessment & Plan:  Bursitis second toe right, mycotic toenail  Plan: Injected the second toe with Kenalog and Marcaine mixture in order to try and reduce the  swelling in the second toe in relieve the pain and discomfort she is experiencing. The injection was carried out under sterile technique and the patient tolerated it well. Also took a sample of the toenail for culture and we will notify her of the result and treatment options. Prescription for Voltaren gel was also caused her pharmacy and she was given instructions for use.

## 2013-08-10 NOTE — Progress Notes (Deleted)
Pain right 2nd toe-- bunion-- bursitis, injection, mycotic toenail, ? Oral, bako, call with culture, voltaren gel

## 2013-08-13 ENCOUNTER — Other Ambulatory Visit: Payer: Self-pay | Admitting: *Deleted

## 2013-08-13 DIAGNOSIS — E119 Type 2 diabetes mellitus without complications: Secondary | ICD-10-CM

## 2013-08-31 ENCOUNTER — Telehealth: Payer: Self-pay | Admitting: *Deleted

## 2013-08-31 NOTE — Telephone Encounter (Signed)
Dr Valentina Lucks states the preliminary culture results are negative for fungal, may use NuVail topical until final results.  Dr Shaune Pollack recommendation called to pt, and pt states she does not want to use NuVail.

## 2013-09-04 ENCOUNTER — Other Ambulatory Visit: Payer: Managed Care, Other (non HMO)

## 2013-09-04 DIAGNOSIS — E119 Type 2 diabetes mellitus without complications: Secondary | ICD-10-CM

## 2013-09-05 ENCOUNTER — Telehealth: Payer: Self-pay | Admitting: Family Medicine

## 2013-09-05 LAB — MICROALBUMIN / CREATININE URINE RATIO
Creatinine, Urine: 113.8 mg/dL
MICROALB UR: 1.86 mg/dL (ref 0.00–1.89)
MICROALB/CREAT RATIO: 16.3 mg/g (ref 0.0–30.0)

## 2013-09-05 NOTE — Telephone Encounter (Signed)
Schedule OV please

## 2013-09-06 NOTE — Telephone Encounter (Signed)
Pt sched OV for [facility] 3/9

## 2013-09-07 ENCOUNTER — Ambulatory Visit (HOSPITAL_COMMUNITY)
Admission: RE | Admit: 2013-09-07 | Payer: Managed Care, Other (non HMO) | Source: Ambulatory Visit | Admitting: Obstetrics and Gynecology

## 2013-09-07 ENCOUNTER — Encounter (HOSPITAL_COMMUNITY): Admission: RE | Payer: Self-pay | Source: Ambulatory Visit

## 2013-09-07 SURGERY — URETHROPEXY, USING TRANSVAGINAL TAPE
Anesthesia: General

## 2013-09-10 ENCOUNTER — Encounter: Payer: Self-pay | Admitting: Family Medicine

## 2013-09-10 ENCOUNTER — Ambulatory Visit (INDEPENDENT_AMBULATORY_CARE_PROVIDER_SITE_OTHER): Payer: Managed Care, Other (non HMO) | Admitting: Family Medicine

## 2013-09-10 VITALS — BP 104/70 | HR 80 | Ht 64.0 in | Wt 191.0 lb

## 2013-09-10 DIAGNOSIS — I10 Essential (primary) hypertension: Secondary | ICD-10-CM

## 2013-09-10 DIAGNOSIS — R51 Headache: Secondary | ICD-10-CM

## 2013-09-10 DIAGNOSIS — N939 Abnormal uterine and vaginal bleeding, unspecified: Secondary | ICD-10-CM

## 2013-09-10 DIAGNOSIS — R7301 Impaired fasting glucose: Secondary | ICD-10-CM

## 2013-09-10 DIAGNOSIS — N898 Other specified noninflammatory disorders of vagina: Secondary | ICD-10-CM

## 2013-09-10 NOTE — Patient Instructions (Signed)
   Cut amlodipine in 1/2 for 1-2 weeks. Monitor your blood pressure. If BP's remain <110-115/70, can try stopping the amlodipine altogether.  Continue the HCTZ 12.5 mg every day (this also treats blood pressure, and is a diuretic which helps with swelling).  I think you have some allergies which likely contributed to the headaches you had last week.  You can try claritin or zyrtec once daily.  You can also try sinus rinses or neti-pot if you get recurrent sinus pain/headaches.  Avoid decongestants as these raise your blood pressure (sinus pills).  Continue to stay off the tradjenta.  Return in May or June for the 3-4 month recheck (being off the [place], and to follow up on the blood pressure).

## 2013-09-10 NOTE — Progress Notes (Signed)
Chief Complaint  Patient presents with  . Headache    was having HA's since stopping amlodipine(called last week and was told to schedule OV). Also was having leg swelling (left leg). She started back on her amlodipine and HCTZ and her swelling has gone away as well as her HA's. Wants to take prenatal vitamin. Also states that she has been bleeding x 2 months, doesn't know what is going on, wants to know if she should have her iron level checked. (Gyn is Dr.Rivard)   Pt had been off her BP meds for about a month prior to her last visit.  BP was okay here, so said to stay off medication.  She subsequently developed headache and left leg swelling, so appointment was made.  In the interim, however, she restarted the amlodipine and HCTZ.  BP's are now better, swelling has resolved.    Headaches were around her eyes, across forehead and at her temples.  At the time, she wasn't having any allergies.  Currently has some mild PND causing intermittently slight cough.  BP was running 138/86 when having headaches, while off her BP meds.  Fasting sugars are running 102-112 (off tradjenta)  She is asking if it is okay to take prenatal vitamins.  She has been having abnormal vaginal bleeding.  She denies dizziness, syncope/pre-syncope, chest pain, shortness of breath.  She has known fibroids.  +bleeding.  Last Hg 12.8 last month.  Past Medical History  Diagnosis Date  . GERD (gastroesophageal reflux disease)   . Hypertension   . Fibroid uterus     GYN--Dr. Rivard  . Recurrent upper respiratory infection (URI)     in October - tx with abx  . Anemia     on iron  . Back pain     tx with ibuprofen 800mg   . Cough     non-productive  . Bronchitis     treated recently by abx  . Infertility, female   . DUB (dysfunctional uterine bleeding)   . Type II or unspecified type diabetes mellitus without mention of complication, not stated as uncontrolled     diagnosed by Dr. Berdine Addison   Past Surgical History   Procedure Laterality Date  . Myomectomy  2002  . Endometrial vaporization w/ versapoint     History   Social History  . Marital Status: Married    Spouse Name: N/A    Number of Children: N/A  . Years of Education: N/A   Occupational History  . 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 Smokeless tobacco: Never Used  . Alcohol Use: Yes     Comment: socially 1-2 drinks, once a  month (mixed drinks)  . Drug Use: No  . Sexual Activity: Yes    Birth Control/ Protection: None   Other Topics Concern  . Not on file   Social History Narrative  . No narrative on file   Outpatient Encounter Prescriptions as of 09/10/2013  Medication Sig  . amLODipine (NORVASC) 5 MG tablet   . hydrochlorothiazide (MICROZIDE) 12.5 MG capsule Take 12.5 mg by mouth daily.  . Vitamin D, Ergocalciferol, (DRISDOL) 50000 UNITS CAPS capsule Take 1 capsule by mouth once a week.  . diclofenac sodium (VOLTAREN) 1 % GEL Apply 2 g topically 4 (four) times daily. Rub into affected area of foot 2 to 4 times daily  . fluconazole (DIFLUCAN) 150 MG tablet Take 1 tablet (150 mg total) by  mouth once.  . Multiple Vitamin (MULTIVITAMIN WITH MINERALS) TABS Take 1 tablet by mouth daily.  . TRADJENTA 5 MG TABS tablet   . [DISCONTINUED] sulfamethoxazole-trimethoprim (BACTRIM DS) 800-160 MG per tablet    ROS:  Denies fevers, chills, URI symptoms, chest pain, shortness of breath, nausea, vomiting, dysuria, abdominal pain, bowel changes. Denies bleeding (other than vaginal), bruising, rashes. Headaches and leg swelling have resolved.  PHYSICAL EXAM: BP 104/70  Pulse 80  Ht 5\' 4"  (1.626 m)  Wt 191 lb (86.637 kg)  BMI 32.77 kg/m2 112/74 on repeat by MD, RA Well developed, pleasant female in no distress HEENT:  PERRL, conjunctiva clear.  TM's and EAC's normal. Nasal mucosa moderately edematous with clear mucus Sinuses nontender. OP clear.  Neck: no lymphadenopathy or  mass Heart: regular rate and rhythm without murmur Lungs: clear bilaterally Extremities: No edema, 2+ pulses, no cords Skin: no rash Psych: normal mood, affect   ASSESSMENT/PLAN:  Essential hypertension, benign - normal BP back on amlodipine and HCTZ.  Doubt her slightly elevated BP was cause for her symptoms, but recurrent edema off HCTZ possible.  Impaired fasting glucose - continue prior plan--remain off meds, and recheck A1c in May or June  Vaginal bleeding - follow up with GYN for further evaluation.  okay to take PNV.  Advised to stay well hydrated  Headache(784.0) - given exam, likely sinus headache (and not related to BP).  headache resolved.  recommend antihistamines and sinus rinses prn recurrent sinus headaches.  HTN--BP is normal/low today, with her being back on meds.  I doubt she needs the amlodipine, at least not at this dose.  Will try decreasing amlodipine (and possibly tapering off); continue HCTZ to prevent recurrent edema (and also help somewhat with BP).   Cut amlodipine in 1/2 for 1-2 weeks. Monitor your blood pressure. If BP's remain <110-115/70, can try stopping the amlodipine altogether.  Continue the HCTZ 12.5 mg every day (this also treats blood pressure, and is a diuretic which helps with swelling).  I think you have some allergies which likely contributed to the headaches you had last week.  You can try claritin or zyrtec once daily.  You can also try sinus rinses or neti-pot if you get recurrent sinus pain/headaches.  Avoid decongestants as these raise your blood pressure (sinus pills).  Continue to stay off the tradjenta.  Return in May or June for the 3-4 month recheck (being off the [place], and to follow up on the blood pressure).  Stay well hydrated.  No need for iron at this point, but might need if heavy/prolonged bleeding persists.  F/u with Dr. Cletis Media if ongoing abnormal bleeding (has known fibroids).  [place] for prenatal vitamin

## 2013-09-14 ENCOUNTER — Telehealth: Payer: Self-pay | Admitting: Internal Medicine

## 2013-09-14 DIAGNOSIS — R062 Wheezing: Secondary | ICD-10-CM | POA: Insufficient documentation

## 2013-09-14 NOTE — Telephone Encounter (Signed)
According to chart an appt could not even be scheduled for thept at [facility] until we sent the records so I am not sure what she is being told. I have attempted to call [facility] several times and speak with someone in Dr. Prince Rome clinic with out success. I have only been able to leave a message. I called and also LM on the # given for the pt advise her we did send all information to [facility]. I will awit a call back from [facility] to see what is needed and discuss this issue with them because it has happened in the past as well. [place] Bing, CMA

## 2013-09-19 NOTE — Telephone Encounter (Signed)
[id number] for LAtoya. [place] Bing, CMA

## 2013-09-19 NOTE — Telephone Encounter (Signed)
Called [facility], was advised Dr. Jules Schick office # is [phone].  This # states to call [phone] if calling in regards to Dr. Jules Schick pts.  I called this #, choose option to leave msg for nurse - [email or web address]  Called, spoke with pt.  Explained below to her per [facility].  Pt states [facility] advised they did not have our records showing where she's had breathing tests done or anything that tested her for asthma.  Advised I have called [facility] and left another msg to ensure they have all records they need.  She verbalized understanding and would like a call back once we speak with [facility].

## 2013-09-19 NOTE — Telephone Encounter (Signed)
Michelle Mack returning call, can be reached @ [phone].Michelle Mack

## 2013-09-20 NOTE — Telephone Encounter (Signed)
Called Dr. Jules Schick office # [phone] Chose option to speak to Nurse [facility]

## 2013-09-20 NOTE — Telephone Encounter (Signed)
Michelle Mack with Dr Prince Rome at [facility] returned call.  Spoke with Michelle Mack who reported that pt was seen by Dr Prince Rome on 3.13.15.  She also verified that Dr Prince Rome does not have a copy of any testing done to diagnose Asthma (ie Spirometry or PFT).  [name] that pt has no recent PFTs but that she has had 2 recent spirometries done 06/2013 and 04/2013.  Michelle Mack requested these be faxed to either her or Dr Prince Rome' attention at [phone].  This has been done.  Michelle Mack aware to call the office back if she does not receive these.  Nothing further needed at this time; will sign off.

## 2013-10-09 ENCOUNTER — Ambulatory Visit (INDEPENDENT_AMBULATORY_CARE_PROVIDER_SITE_OTHER): Payer: Managed Care, Other (non HMO) | Admitting: Medical

## 2013-10-09 ENCOUNTER — Encounter: Payer: Self-pay | Admitting: Medical

## 2013-10-09 VITALS — BP 110/80 | HR 77 | Temp 98.3°F | Resp 16 | Wt 195.0 lb

## 2013-10-09 DIAGNOSIS — J40 Bronchitis, not specified as acute or chronic: Secondary | ICD-10-CM

## 2013-10-09 DIAGNOSIS — R059 Cough, unspecified: Secondary | ICD-10-CM

## 2013-10-09 DIAGNOSIS — R062 Wheezing: Secondary | ICD-10-CM

## 2013-10-09 DIAGNOSIS — J329 Chronic sinusitis, unspecified: Secondary | ICD-10-CM

## 2013-10-09 DIAGNOSIS — R053 Chronic cough: Secondary | ICD-10-CM

## 2013-10-09 DIAGNOSIS — R05 Cough: Secondary | ICD-10-CM

## 2013-10-09 MED ORDER — DOXYCYCLINE HYCLATE 100 MG PO TABS: 100.0000 mg | ORAL_TABLET | Freq: Two times a day (BID) | ORAL | Status: DC

## 2013-10-09 NOTE — Progress Notes (Signed)
Subjective:  Michelle Mack is a 48 y.o. female who presents for cough and congestion.  Symptoms include 3 wk ago with itchy throat, itchy ear, but this has progressively changed to sore throat, productive cough, hoarse voice, nose stopped up, coughing up green sputum, green nasal drainage.   +sinus pressure, decreased sense of taste and smell.  Denies fever, no teeth pain, no ear pain.  At times wheezy and SOB.  Chest hurts from all the coughing.  Treatment to date: alka seltzer plus, theraflu, Nasacort.  No sick contacts.   She does not smoke.  She is currently being seen by [facility], referred by pulmonology here in [place] as we still don't have a good handle on cause and treatment of her chronic cough.  No other aggravating or relieving factors.  No other c/o.  The following portions of the patient's history were reviewed and updated as appropriate: allergies, current medications, past family history, past medical history, past social history, past surgical history and problem list.  ROS as in subjective  Past Medical History  Diagnosis Date  . GERD (gastroesophageal reflux disease)   . Hypertension   . Fibroid uterus     GYN--Dr. Rivard  . Recurrent upper respiratory infection (URI)     in October - tx with abx  . Anemia     on iron  . Back pain     tx with ibuprofen 800mg   . Cough     non-productive  . Bronchitis     treated recently by abx  . Infertility, female   . DUB (dysfunctional uterine bleeding)   . Type II or unspecified type diabetes mellitus without mention of complication, not stated as uncontrolled     diagnosed by Dr. Berdine Addison     Objective: BP 110/80  Pulse 77  Temp(Src) 98.3 F (36.8 C) (Oral)  Resp 16  Wt 195 lb (88.451 kg)  SpO2 98%   General appearance: Alert, WD/WN, no distress                             Skin: warm, no rash, no diaphoresis                           Head: +sinus tenderness                            Eyes: conjunctiva  normal, corneas clear, PERRLA                            Ears: flat TMs, external ear canals normal                          Nose: septum midline, turbinates swollen, with erythema and mucoid discharge             Mouth/throat: MMM, tongue normal, mild pharyngeal erythema                           Neck: supple, no adenopathy, no thyromegaly, nontender                          Heart: RRR, normal S1, S2, no murmurs  Lungs: +bronchial breath sounds, no rhonchi, no wheezes, no rales                Extremities: no edema, nontender     Assessment: Encounter Diagnoses  Name Primary?  . Sinobronchitis Yes  . Chronic cough   . Wheezing       Plan:  Medication orders today include: Doxycycline.  C/t sudafed or Chlortrimeton for cough/congestion, c/t recommendation per [facility] including nasal saline, nasal steroid, Flovent, and Prilosec.  Call/return in 2-3 days if symptoms are worse or not improving.  F/u with [facility] as planned later this month.

## 2013-10-19 ENCOUNTER — Ambulatory Visit: Payer: Managed Care, Other (non HMO) | Admitting: Internal Medicine

## 2013-11-12 ENCOUNTER — Encounter: Payer: Self-pay | Admitting: Family Medicine

## 2013-11-12 ENCOUNTER — Ambulatory Visit (INDEPENDENT_AMBULATORY_CARE_PROVIDER_SITE_OTHER): Payer: Managed Care, Other (non HMO) | Admitting: Family Medicine

## 2013-11-12 VITALS — BP 120/70 | HR 72 | Temp 98.4°F | Ht 64.0 in | Wt 191.0 lb

## 2013-11-12 DIAGNOSIS — R059 Cough, unspecified: Secondary | ICD-10-CM

## 2013-11-12 DIAGNOSIS — I10 Essential (primary) hypertension: Secondary | ICD-10-CM

## 2013-11-12 DIAGNOSIS — R053 Chronic cough: Secondary | ICD-10-CM

## 2013-11-12 DIAGNOSIS — R7301 Impaired fasting glucose: Secondary | ICD-10-CM

## 2013-11-12 DIAGNOSIS — R05 Cough: Secondary | ICD-10-CM

## 2013-11-12 DIAGNOSIS — J310 Chronic rhinitis: Secondary | ICD-10-CM

## 2013-11-12 DIAGNOSIS — E559 Vitamin D deficiency, unspecified: Secondary | ICD-10-CM

## 2013-11-12 NOTE — Patient Instructions (Signed)
Make sure to just take gentle sniffs of the flonase (so it doesn't drip out the nose; not so that you taste/swallow it).  Continue zyrtec daily. Add Mucinex daily (guaifenesin), as directed on the package  Your blood pressure is well controlled on your current regimen (1/2 tablet of amlodipine and full HCTZ daily)--continue this.  Stay off the diabetes medication.  Continue to monitor your blood sugars.  Return sooner than 6 months if your sugars are running consistently >125-140 daily.  We will be in touch with your vitamin D results.  It is important that you get at least 1000 IU (up to 2000) every day, long-term, in order to maintain your levels.

## 2013-11-12 NOTE — Progress Notes (Signed)
Chief Complaint  Patient presents with  . Hypertension    and diabetes 3 month nonfasting follow up. Wonders if she should have her vitamin D rechecked, GYN has been doing in the past. Patient brings in labs today from 3/19-15 and A1c was 6.2. Also saw Audelia Acton last month and was given doxycycline, made her vomit so she stopped taking(took about a week's worth). So she is still coughing,wheezing and sinus pain would like to know if you could re-examine.     Chronic cough--she was sent by [place] to pulmonary at [facility].  They put her on a prednisone course, as well as Flovent and albuterol.  She is no longer taking these inhalers, as they weren't helpful.  She continues to cough.  Cough is nonproductive. She saw Audelia Acton 4/7 with discolored mucus/phlegm, sinus pain, congestion.  She took the doxycycline for about a week, then it caused abdominal pain and vomiting.  Those symptoms have resolved.  She has ongoing chronic cough (unchanged).  She is complaining of runny nose, itchy and burning eyes.  She has been using 2 sprays of Flonase into each nostril and taking zyrtec daily since her visit with Audelia Acton last month.  It doesn't seem to be helping. She does admit that the flonase goes down her throat and she tastes it.  She recalls having allergy testing that was reportedly normal.  Hypertension follow-up:  Blood pressures elsewhere are 100-120's/60's-70's.  Denies dizziness, headaches, chest pain.  Denies side effects of medications. She isn't having any swelling.  When off her BP meds in the past, she had recurrent swelling and headaches. She has been able to stay at the 1/2 tablet of amlodipine with normal blood pressures, and no recurrent swelling since being back on HCTZ.  IFG:  She is checking her sugars, ranging from 110-120, fasting.  She brings in copies of labs from April done for life insurance--see below.  She never started the prenatal vitamins (she asked about them at her last visit 2 months  ago).  She is not taking her MVI.  She has a h/o vitamin D deficiency, previously treated by her GYN.  She has been off the prescription vitamin D for about 3-4 weeks, and is asking to have it rechecked today.  She had abnormal vaginal bleeding, and was treated with Provera by GYN--bleeding didn't stop with the provera, but bleeding stopped last month.  Her GYN recommended surgical treatment (for urinary incontinence).  Waiting on [facility] to sign off on the paperwork  Labs from life insurance, done 09/14/13: Glucose 95, A1c 6.2, fructosamine 1.93 (normal up to 1.6) Normal LFT's Chol 147, HDL 42.3, LDL 82, TG 112, chol/HDL 3.47 HIV negative U/a normal  Past Medical History  Diagnosis Date  . GERD (gastroesophageal reflux disease)   . Hypertension   . Fibroid uterus     GYN--Dr. Rivard  . Recurrent upper respiratory infection (URI)     in October - tx with abx  . Anemia     on iron  . Back pain     tx with ibuprofen 800mg   . Cough     non-productive  . Bronchitis     treated recently by abx  . Infertility, female   . DUB (dysfunctional uterine bleeding)   . Type II or unspecified type diabetes mellitus without mention of complication, not stated as uncontrolled     diagnosed by Dr. Berdine Addison   Past Surgical History  Procedure Laterality Date  . Myomectomy  2002  .  Endometrial vaporization w/ versapoint     History   Social History  . Marital Status: Married    Spouse Name: N/A    Number of Children: N/A  . Years of Education: N/A   Occupational History  . 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 Smokeless tobacco: Never Used  . Alcohol Use: Yes     Comment: socially 1-2 drinks, once a  month (mixed drinks)  . Drug Use: No  . Sexual Activity: Yes    Birth Control/ Protection: None   Other Topics Concern  . Not on file   Social History Narrative  . No narrative on file   Outpatient Encounter Prescriptions as  of 11/12/2013  Medication Sig Note  . amLODipine (NORVASC) 5 MG tablet  11/12/2013: Taking 1/2 tablet daily  . cetirizine (ZYRTEC) 10 MG tablet Take 10 mg by mouth daily.   . fluticasone (FLONASE) 50 MCG/ACT nasal spray Place 2 sprays into both nostrils daily.   . hydrochlorothiazide (MICROZIDE) 12.5 MG capsule Take 12.5 mg by mouth daily.   . diclofenac sodium (VOLTAREN) 1 % GEL Apply 2 g topically 4 (four) times daily. Rub into affected area of foot 2 to 4 times daily 11/12/2013: rx'd by podiatrist--she wasn't aware of rx and never had it filled  . doxycycline (VIBRA-TABS) 100 MG tablet Take 1 tablet (100 mg total) by mouth 2 (two) times daily. 11/12/2013: Only took for a week--it made her stomach hurt, vomiting  . Multiple Vitamin (MULTIVITAMIN WITH MINERALS) TABS Take 1 tablet by mouth daily. 11/12/2013: Not taking  . PROAIR HFA 108 (90 BASE) MCG/ACT inhaler  11/12/2013: Prescribed at [facility]; not helpful, not taking  . [DISCONTINUED] FLOVENT HFA 220 MCG/ACT inhaler  11/12/2013: rx'd by pulmonary at [facility].  Stopped because it wasn't helping  . [DISCONTINUED] fluconazole (DIFLUCAN) 150 MG tablet Take 1 tablet (150 mg total) by mouth once.   . [DISCONTINUED] Vitamin D, Ergocalciferol, (DRISDOL) 50000 UNITS CAPS capsule Take 1 capsule by mouth once a week. 08/09/2013: Received from: External Pharmacy (rx'd by GYN) Received Sig:    Allergies  Allergen Reactions  . Ampicillin Shortness Of Breath  . Penicillins Shortness Of Breath and Swelling  . Cephalosporins Itching  . Nitrofuran Derivatives Itching    Throat, mouth, hands itch   ROS:  Denies fevers, chills.  Nausea and vomiting resolved.  +cough, runny nose.  No shortness of breath, chest pain, headaches, dizziness.  No dysuria.  No pelvic pain.  +urinary incontinence  PHYSICAL EXAM: BP 120/70  Pulse 72  Temp(Src) 98.4 F (36.9 C) (Oral)  Ht 5\' 4"  (1.626 m)  Wt 191 lb (86.637 kg)  BMI 32.77 kg/m2  LMP 11/01/2013 Pleasant female, in no distress.   Throat-clearing during visit, rare cough HEENT:  PERRL, EOMI, conjunctiva clear.  TM's and EAC's normal.  Nasal mucosa is mod-severely edematous, with clear mucus.  No erythema.  Sinuses are nontender.  OP is clear. Neck: no lymphadenopathy, mass Heart: regular rate and rhythm, no murmur Lungs clear bilaterally, no wheezes, rales, ronchi Abdomen: soft, nontender, no mass Extremities: no edema, 2+ pulse Psych: normal mood, affect, hygiene and grooming Neuro: alert and oriented.  Cranial nerves intact.  Grossly normal strength, gait  ASSESSMENT/PLAN:  Essential hypertension, benign - well controlled  Impaired fasting glucose - stable, off meds  Unspecified vitamin D deficiency - recheck today.  discussed need for long-term OTC supplementation - Plan: Vit  D  25 hydroxy (rtn osteoporosis monitoring)  Chronic cough  Rhinitis - nonallergic per pt (h/o allergy tests), but clearly symptomatic   Rhinitis (?non-allergic since pt reports normal allergy tests in the past).  Discussed proper use of flonase--gentle sniffs.  Continue antihistamine.  Recommended use of mucinex.  Consider astelin or atrovent nasal spray.  IFG--cont to monitor sugars.  Return if consistently >125-140  HTN--well controlled  Vit D deficiency--Recheck today per pt request (finished rx treatment 3 wks ago by GYN)  F/u 6 mos, sooner prn

## 2013-11-13 ENCOUNTER — Encounter: Payer: Self-pay | Admitting: Family Medicine

## 2013-11-13 LAB — VITAMIN D 25 HYDROXY (VIT D DEFICIENCY, FRACTURES): Vit D, 25-Hydroxy: 28 ng/mL — ABNORMAL LOW (ref 30–89)

## 2013-11-16 ENCOUNTER — Encounter: Payer: Self-pay | Admitting: Family Medicine

## 2013-11-18 ENCOUNTER — Encounter (HOSPITAL_COMMUNITY): Payer: Self-pay | Admitting: Emergency Medicine

## 2013-11-18 ENCOUNTER — Emergency Department (HOSPITAL_COMMUNITY): Payer: Managed Care, Other (non HMO)

## 2013-11-18 DIAGNOSIS — R5381 Other malaise: Secondary | ICD-10-CM | POA: Insufficient documentation

## 2013-11-18 DIAGNOSIS — E119 Type 2 diabetes mellitus without complications: Secondary | ICD-10-CM | POA: Insufficient documentation

## 2013-11-18 DIAGNOSIS — R5383 Other fatigue: Secondary | ICD-10-CM

## 2013-11-18 DIAGNOSIS — R079 Chest pain, unspecified: Secondary | ICD-10-CM | POA: Insufficient documentation

## 2013-11-18 DIAGNOSIS — R0602 Shortness of breath: Secondary | ICD-10-CM | POA: Insufficient documentation

## 2013-11-18 DIAGNOSIS — R42 Dizziness and giddiness: Secondary | ICD-10-CM | POA: Insufficient documentation

## 2013-11-18 DIAGNOSIS — I10 Essential (primary) hypertension: Secondary | ICD-10-CM | POA: Insufficient documentation

## 2013-11-18 DIAGNOSIS — R110 Nausea: Secondary | ICD-10-CM | POA: Insufficient documentation

## 2013-11-18 LAB — BASIC METABOLIC PANEL
BUN: 20 mg/dL (ref 6–23)
CALCIUM: 9.6 mg/dL (ref 8.4–10.5)
CO2: 27 mEq/L (ref 19–32)
CREATININE: 0.9 mg/dL (ref 0.50–1.10)
Chloride: 99 mEq/L (ref 96–112)
GFR calc Af Amer: 88 mL/min — ABNORMAL LOW (ref >90)
GFR calc non Af Amer: 76 mL/min — ABNORMAL LOW (ref >90)
GLUCOSE: 144 mg/dL — AB (ref 70–99)
POTASSIUM: 3.8 meq/L (ref 3.7–5.3)
Sodium: 141 mEq/L (ref 137–147)

## 2013-11-18 LAB — CBC
HEMATOCRIT: 39.3 % (ref 36.0–46.0)
HEMOGLOBIN: 13.2 g/dL (ref 12.0–15.0)
MCH: 28.6 pg (ref 26.0–34.0)
MCHC: 33.6 g/dL (ref 30.0–36.0)
MCV: 85.2 fL (ref 78.0–100.0)
Platelets: 334 10*3/uL (ref 150–400)
RBC: 4.61 MIL/uL (ref 3.87–5.11)
RDW: 13.4 % (ref 11.5–15.5)
WBC: 8.4 10*3/uL (ref 4.0–10.5)

## 2013-11-18 LAB — I-STAT TROPONIN, ED: Troponin i, poc: 0.01 ng/mL (ref 0.00–0.08)

## 2013-11-18 LAB — PRO B NATRIURETIC PEPTIDE: Pro B Natriuretic peptide (BNP): 5.1 pg/mL (ref 0–125)

## 2013-11-18 NOTE — ED Notes (Addendum)
Pt reports intermittent 5/10 sharp central chest pain that radiates to right arm; associated with nausea, SOB, lightheadedness and weakness. AO x4. NAD.

## 2013-11-19 ENCOUNTER — Telehealth (HOSPITAL_COMMUNITY): Payer: Self-pay

## 2013-11-19 ENCOUNTER — Emergency Department (HOSPITAL_COMMUNITY)
Admission: EM | Admit: 2013-11-19 | Discharge: 2013-11-19 | Discharge status: Against Medical Advice | Payer: Managed Care, Other (non HMO) | Attending: Emergency Medicine | Admitting: Emergency Medicine

## 2013-11-19 NOTE — ED Notes (Signed)
Informed that pt decided to leave and walked out.

## 2013-11-21 ENCOUNTER — Encounter: Payer: Self-pay | Admitting: Family Medicine

## 2013-11-21 ENCOUNTER — Ambulatory Visit (INDEPENDENT_AMBULATORY_CARE_PROVIDER_SITE_OTHER): Payer: Managed Care, Other (non HMO) | Admitting: Family Medicine

## 2013-11-21 VITALS — BP 120/82 | HR 80 | Temp 98.8°F

## 2013-11-21 DIAGNOSIS — J45909 Unspecified asthma, uncomplicated: Secondary | ICD-10-CM

## 2013-11-21 DIAGNOSIS — R053 Chronic cough: Secondary | ICD-10-CM

## 2013-11-21 DIAGNOSIS — R05 Cough: Secondary | ICD-10-CM

## 2013-11-21 DIAGNOSIS — R062 Wheezing: Secondary | ICD-10-CM

## 2013-11-21 DIAGNOSIS — R059 Cough, unspecified: Secondary | ICD-10-CM

## 2013-11-21 MED ORDER — ALBUTEROL SULFATE (2.5 MG/3ML) 0.083% IN NEBU: 2.5000 mg | INHALATION_SOLUTION | Freq: Once | RESPIRATORY_TRACT | Status: DC

## 2013-11-21 NOTE — Patient Instructions (Addendum)
Continue to take the prednisone as directed.  Take the full course of the antibiotic, remembering that it continues to work for a full 10 days.  Try using the aerochamber along with the inhaler (prescription was given, get from the pharmacy).  Use this only when you feel tight in the chest, shortn of breath, wheezing. The albuterol inhaler that you have is the same medication that you were given via nebulizer in the office today.  Continue to the Mucinex round the clock, as directed on the package

## 2013-11-21 NOTE — Progress Notes (Signed)
Chief Complaint  Patient presents with  . Follow-up    ER follow up on chest pains(not seen at ER)   Patient went to the ER on 5/18 with chest pain, but left without being seen because she had to take her daughter to work.  She had EKG, CXR and bloodwork done.  She left when she heard there were 15 people in front of her, and she couldn't wait. Yesterday she went to [facility].  She was put on prednisone and z-pak.  She wasn't given any after-visit summary, and she wasn't reportedly told of a diagnosis, just that she "might have an infection".  She feels worse compared to yesterday--feels short of breath when she lays down, feels congested in her chest.  She started the prednisone this morning, having taken 60mg . She took first dose of z-pak this morning.  She started with Mucinex last night.  She took one dose last night, none today.  She is coughing up green mucus.  Not having discolored nasal mucus.  No sinus headaches or fevers (headaches resolved, had been having them).  Past Medical History  Diagnosis Date  . GERD (gastroesophageal reflux disease)   . Hypertension   . Fibroid uterus     GYN--Dr. Rivard  . Recurrent upper respiratory infection (URI)     in October - tx with abx  . Anemia     on iron  . Back pain     tx with ibuprofen 800mg   . Cough     non-productive  . Bronchitis     treated recently by abx  . Infertility, female   . DUB (dysfunctional uterine bleeding)   . Type II or unspecified type diabetes mellitus without mention of complication, not stated as uncontrolled     diagnosed by Dr. Berdine Addison   Past Surgical History  Procedure Laterality Date  . Myomectomy  2002  . Endometrial vaporization w/ versapoint     History   Social History  . Marital Status: Married    Spouse Name: N/A    Number of Children: N/A  . Years of Education: N/A   Occupational History  . 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 Smoking status:  Never Smoker   . Smokeless tobacco: Never Used  . Alcohol Use: Yes     Comment: socially 1-2 drinks, once a  month (mixed drinks)  . Drug Use: No  . Sexual Activity: Yes    Birth Control/ Protection: None   Other Topics Concern  . Not on file   Social History Narrative  . No narrative on file   Outpatient Encounter Prescriptions as of 11/21/2013  Medication Sig Note  . amLODipine (NORVASC) 5 MG tablet  11/12/2013: Taking 1/2 tablet daily  . cetirizine (ZYRTEC) 10 MG tablet Take 10 mg by mouth daily.   . cholecalciferol (VITAMIN D) 1000 UNITS tablet Take 1,000 Units by mouth daily.   . hydrochlorothiazide (MICROZIDE) 12.5 MG capsule Take 12.5 mg by mouth daily.   . Multiple Vitamin (MULTIVITAMIN WITH MINERALS) TABS Take 1 tablet by mouth daily. 11/12/2013: Not taking  . fluticasone (FLONASE) 50 MCG/ACT nasal spray Place 2 sprays into both nostrils daily.   Marland Kitchen PROAIR HFA 108 (90 BASE) MCG/ACT inhaler  11/12/2013: Prescribed at [facility]; not helpful, not taking  . [DISCONTINUED] diclofenac sodium (VOLTAREN) 1 % GEL Apply 2 g topically 4 (four) times daily. Rub into affected area of foot 2 to 4 times daily  11/12/2013: rx'd by podiatrist--she wasn't aware of rx and never had it filled  . [DISCONTINUED] doxycycline (VIBRA-TABS) 100 MG tablet Take 1 tablet (100 mg total) by mouth 2 (two) times daily. 11/12/2013: Only took for a week--it made her stomach hurt, vomiting   Z-pak (started today) Prednisone taper (started today, with 60mg )  Allergies  Allergen Reactions  . Ampicillin Shortness Of Breath  . Penicillins Shortness Of Breath and Swelling  . Cephalosporins Itching  . Nitrofuran Derivatives Itching    Throat, mouth, hands itch    ROS:  Denies fevers, chills, nausea, vomiting, diarrhea.  +cough, shortness of breath.  No sinus pain, ear pain, sore throat.  See HPI. +chest pain/congestion.  No exertional symptoms.  PHYSICAL EXAM: BP 120/82  Pulse 80  LMP 11/01/2013 Well developed female,  with rattling heard in chest/upper airway while seated at rest. Deep, wet-sounding cough.  This resolved after treatment with nebulizer HEENT:  PERRL, EOMI, conjunctiva clear. Nose--severe edema, no erythema or purulence, clear mucus. OP clear.  Sinuses nontender.  TM's and EAC's normal Heart: regular rate and rhythm Lungs--ronchi and upper airway sounds resonated throughout.  Clear after nebulizer treatment. No wheezes, rales, ronchi.  Patient felt much better after neb.  Labs/EKG/CXR results from ER reviewed with patient. All questions answered  ASSESSMENT/PLAN:  Asthmatic bronchitis  Wheezing - Plan: albuterol (PROVENTIL) (2.5 MG/3ML) 0.083% nebulizer solution 2.5 mg, PR INHAL RX, AIRWAY OBST/DX SPUTUM INDUCT, PR INHAL RX, AIRWAY OBST/DX SPUTUM INDUCT  Chronic cough  She states the albuterol neb was very helpful.  Reports that inhaler seems to make her worse.  Recommended trial with aerochamber. rx for aerochamber given (already has inhaler).  Instructed on proper use.  Return in 7-10 days prn if not completely better. Complete z-pak and prednisone course as directed. Use mucinex regularly

## 2013-11-28 ENCOUNTER — Encounter: Payer: Managed Care, Other (non HMO) | Admitting: Family Medicine

## 2013-12-04 ENCOUNTER — Encounter: Payer: Self-pay | Admitting: Podiatrist

## 2013-12-24 ENCOUNTER — Ambulatory Visit: Payer: Managed Care, Other (non HMO) | Admitting: Family Medicine

## 2014-01-02 ENCOUNTER — Encounter: Payer: Self-pay | Admitting: Family Medicine

## 2014-01-02 ENCOUNTER — Ambulatory Visit (INDEPENDENT_AMBULATORY_CARE_PROVIDER_SITE_OTHER): Payer: Managed Care, Other (non HMO) | Admitting: Family Medicine

## 2014-01-02 VITALS — BP 118/76 | HR 80 | Ht 64.0 in | Wt 199.0 lb

## 2014-01-02 DIAGNOSIS — R05 Cough: Secondary | ICD-10-CM

## 2014-01-02 DIAGNOSIS — R7301 Impaired fasting glucose: Secondary | ICD-10-CM

## 2014-01-02 DIAGNOSIS — R059 Cough, unspecified: Secondary | ICD-10-CM

## 2014-01-02 HISTORY — PX: BRONCHOSCOPY: SUR163

## 2014-01-02 NOTE — Progress Notes (Signed)
Chief Complaint  Patient presents with  . Advice Only    pulmonology has patient on 28 day course of prednisone and he told her to follow up with PCP as this medication could make her sugars elevated.    [facility] started her on Prednisone 5 days ago, and told her to f/u here due to risk for sugar elevations on the steroids.  She is currently on 40mg  once daily for a week (now on day 5), and then will take 30mg  for a week, followed by 20mg  for a week, then 10mg , and then she will f/u at [facility]. She has been checking her sugars twice daily.   Sugars readings as read from her glucometer, first reading from yesterday, and then going backwards: 99, 120, 111, 112, 94, 107, 86, 116, 108, 86, 83, 100, 97  She previously took [place].  I don't believe she was ever diagnosed with diabetes, just pre-diabetes.  Didn't tolerate metformin, and so was changed to [place].  (highest A1c here was 6.4, but unsure how high they were at Dr. Cathey Endow office in the past)  She hasn't noticed any improvement in her cough or shortness of breath so far.  Past Medical History  Diagnosis Date  . GERD (gastroesophageal reflux disease)   . Hypertension   . Fibroid uterus     GYN--Dr. Rivard  . Recurrent upper respiratory infection (URI)     in October - tx with abx  . Anemia     on iron  . Back pain     tx with ibuprofen 800mg   . Cough     non-productive  . Bronchitis     treated recently by abx  . Infertility, female   . DUB (dysfunctional uterine bleeding)   . Type II or unspecified type diabetes mellitus without mention of complication, not stated as uncontrolled     diagnosed by Dr. Berdine Addison   Past Surgical History  Procedure Laterality Date  . Myomectomy  2002  . Endometrial vaporization w/ versapoint     History   Social History  . Marital Status: Married    Spouse Name: N/A    Number of Children: N/A  . Years of Education: N/A   Occupational History  . 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 Smokeless tobacco: Never Used  . Alcohol Use: Yes     Comment: socially 1-2 drinks, once a  month (mixed drinks)  . Drug Use: No  . Sexual Activity: Yes    Birth Control/ Protection: None   Other Topics Concern  . Not on file   Social History Narrative  . No narrative on file   Outpatient Encounter Prescriptions as of 01/02/2014  Medication Sig Note  . amLODipine (NORVASC) 5 MG tablet  11/12/2013: Taking 1/2 tablet daily  . cholecalciferol (VITAMIN D) 1000 UNITS tablet Take 1,000 Units by mouth daily.   Marland Kitchen FLOVENT HFA 220 MCG/ACT inhaler    . hydrochlorothiazide (MICROZIDE) 12.5 MG capsule Take 12.5 mg by mouth daily.   . Multiple Vitamin (MULTIVITAMIN WITH MINERALS) TABS Take 1 tablet by mouth daily. 11/12/2013: Not taking  . predniSONE (DELTASONE) 10 MG tablet Take 10 mg by mouth as directed.   . Prenatal Vit-Fe Fumarate-FA (PRENATAL VITAMIN PO) Take 1 tablet by mouth daily. 01/02/2014: Received from: [place]:   . cetirizine (ZYRTEC) 10 MG tablet Take 10 mg by mouth daily.   . [  EXPIRED] fluconazole (DIFLUCAN) 100 MG tablet Take 1 tablet by mouth daily. Take daily x 3 days 01/02/2014: Received from: [place] (completed 3 day course)  . fluticasone (FLONASE) 50 MCG/ACT nasal spray Place 2 sprays into both nostrils daily.   Marland Kitchen PROAIR HFA 108 (90 BASE) MCG/ACT inhaler  11/12/2013: Prescribed at [facility]; not helpful, not taking  reconciled Meds from [facility]  Allergies  Allergen Reactions  . Ampicillin Shortness Of Breath  . Penicillins Shortness Of Breath and Swelling  . Cephalosporins Itching  . Nitrofuran Derivatives Itching    Throat, mouth, hands itch   ROS:  No fevers, chills.  Persistent cough, shortness of breath.  No vision changes, chest pain, urinary symptoms or other complaints.  PHYSICAL EXAM: BP 118/76  Pulse 80  Ht 5\' 4"  (1.626 m)  Wt 199 lb (90.266 kg)   BMI 34.14 kg/m2  LMP 12/20/2013 Well developed, pleasant female in no distress Neck: no lymphadenopathy or mass Heart: regular rate and rhythm Lungs: clear bilaterally, no wheezes, rales, ronchi Extremities: no edema   ASSESSMENT/PLAN:  IFG (impaired fasting glucose)  Cough  Sugars remain acceptable despite being on prednisone.  I suspect they will not increase significantly, if they haven't at this point.  If they increase >200, then will restart tradjenta until she completes course of prednisone.  Pt will call if sugars increase.  F/u with [facility] as scheduled. Hypertension is well controlled.

## 2014-01-02 NOTE — Patient Instructions (Signed)
Keep monitoring your blood sugars.  If they increase significantly (ie over 200 regularly), then call us and we can restart the Tradjenta temporarily until you are off steroids.  I doubt this will happen, since your sugars so far remain quite good.

## 2014-01-10 ENCOUNTER — Emergency Department (HOSPITAL_COMMUNITY): Payer: Managed Care, Other (non HMO)

## 2014-01-10 ENCOUNTER — Encounter (HOSPITAL_COMMUNITY): Payer: Self-pay | Admitting: Emergency Medicine

## 2014-01-10 ENCOUNTER — Emergency Department (HOSPITAL_COMMUNITY)
Admission: EM | Admit: 2014-01-10 | Discharge: 2014-01-10 | Disposition: A | Discharge status: Home / Self Care | Payer: Managed Care, Other (non HMO) | Attending: Emergency Medicine | Admitting: Emergency Medicine

## 2014-01-10 DIAGNOSIS — I10 Essential (primary) hypertension: Secondary | ICD-10-CM | POA: Insufficient documentation

## 2014-01-10 DIAGNOSIS — IMO0002 Reserved for concepts with insufficient information to code with codable children: Secondary | ICD-10-CM | POA: Insufficient documentation

## 2014-01-10 DIAGNOSIS — Z8719 Personal history of other diseases of the digestive system: Secondary | ICD-10-CM | POA: Insufficient documentation

## 2014-01-10 DIAGNOSIS — Z880 Allergy status to penicillin: Secondary | ICD-10-CM | POA: Insufficient documentation

## 2014-01-10 DIAGNOSIS — Z8742 Personal history of other diseases of the female genital tract: Secondary | ICD-10-CM | POA: Insufficient documentation

## 2014-01-10 DIAGNOSIS — J9801 Acute bronchospasm: Secondary | ICD-10-CM | POA: Insufficient documentation

## 2014-01-10 DIAGNOSIS — E119 Type 2 diabetes mellitus without complications: Secondary | ICD-10-CM | POA: Insufficient documentation

## 2014-01-10 DIAGNOSIS — Z79899 Other long term (current) drug therapy: Secondary | ICD-10-CM | POA: Insufficient documentation

## 2014-01-10 DIAGNOSIS — Z862 Personal history of diseases of the blood and blood-forming organs and certain disorders involving the immune mechanism: Secondary | ICD-10-CM | POA: Insufficient documentation

## 2014-01-10 MED ORDER — IPRATROPIUM-ALBUTEROL 0.5-2.5 (3) MG/3ML IN SOLN
3.0000 mL | Freq: Once | RESPIRATORY_TRACT | Status: AC
  Administered 2014-01-10: 3 mL via RESPIRATORY_TRACT
  Filled 2014-01-10: qty 3

## 2014-01-10 MED ORDER — ALBUTEROL SULFATE (2.5 MG/3ML) 0.083% IN NEBU
5.0000 mg | INHALATION_SOLUTION | Freq: Once | RESPIRATORY_TRACT | Status: AC
  Administered 2014-01-10: 5 mg via RESPIRATORY_TRACT
  Filled 2014-01-10: qty 6

## 2014-01-10 MED ORDER — PREDNISONE 20 MG PO TABS
50.0000 mg | ORAL_TABLET | Freq: Every day | ORAL | Status: DC
  Administered 2014-01-10: 50 mg via ORAL
  Filled 2014-01-10: qty 3

## 2014-01-10 MED ORDER — PREDNISONE 50 MG PO TABS: 50.0000 mg | ORAL_TABLET | Freq: Every day | ORAL | Status: DC

## 2014-01-10 NOTE — Discharge Instructions (Signed)
Bronchospasm A bronchospasm is when the tubes that carry air in and out of your lungs (airways) spasm or tighten. During a bronchospasm it is hard to breathe. This is because the airways get smaller. A bronchospasm can be triggered by:  Allergies. These may be to animals, pollen, food, or mold.  Infection. This is a common cause of bronchospasm.  Exercise.  Irritants. These include pollution, cigarette smoke, strong odors, aerosol sprays, and paint fumes.  Weather changes.  Stress.  Being emotional. HOME CARE   Always have a plan for getting help. Know when to call your doctor and local emergency services (911 in the [place] Know where you can get emergency care.  Only take medicines as told by your doctor.  If you were prescribed an inhaler or nebulizer machine, ask your doctor how to use it correctly. Always use a spacer with your inhaler if you were given one.  Stay calm during an attack. Try to relax and breathe more slowly.  Control your home environment:  Change your heating and air conditioning filter at least once a month.  Limit your use of fireplaces and wood stoves.  Do not  smoke. Do not  allow smoking in your home.  Avoid perfumes and fragrances.  Get rid of pests (such as roaches and mice) and their droppings.  Throw away plants if you see mold on them.  Keep your house clean and dust free.  Replace carpet with wood, tile, or vinyl flooring. Carpet can trap dander and dust.  Use allergy-proof pillows, mattress covers, and box spring covers.  Wash bed sheets and blankets every week in hot water. Dry them in a dryer.  Use blankets that are made of polyester or cotton.  Wash hands frequently. GET HELP IF:  You have muscle aches.  You have chest pain.  The thick spit you spit or cough up (sputum) changes from clear or white to yellow, green, gray, or bloody.  The thick spit you spit or cough up gets thicker.  There are problems that may be related  to the medicine you are given such as:  A rash.  Itching.  Swelling.  Trouble breathing. GET HELP RIGHT AWAY IF:  You feel you cannot breathe or catch your breath.  You cannot stop coughing.  Your treatment is not helping you breathe better.  You have very bad chest pain. MAKE SURE YOU:   Understand these instructions.  Will watch your condition.  Will get help right away if you are not doing well or get worse. Document Released: 04/18/2009 Document Revised: 06/26/2013 Document Reviewed: 12/12/2012 ExitCare Patient Information 2015 [employer]. This information is not intended to replace advice given to you by your health care provider. Make sure you discuss any questions you have with your health care provider.  

## 2014-01-10 NOTE — ED Notes (Signed)
Pt states she was informed to remove monitor leeds b/c doctor stated all she needs is an xray.

## 2014-01-10 NOTE — ED Provider Notes (Signed)
CSN: [id number]     Arrival date & time 01/10/14  1604 History   First MD Initiated Contact with Patient 01/10/14 1852     Chief Complaint  Patient presents with  . Shortness of Breath   HPI Patient presents to the emergency room for shortness of breath. The patient has been having trouble with intermittent shortness of breath for the last year. She has been evaluated at [facility]. They were concerned about the possibility of some type of upper airway restriction. Patient had a bronchoscopy procedure done on Tuesday. Today the patient felt like she was having trouble exhaling. She felt like she could take when necessary but not out. Patient uses inhaler but has not noticed any changes. She's also been on steroids frequently in the past. She denies any fever. She denies any chest pain. She denies any numbness or weakness. Past Medical History  Diagnosis Date  . GERD (gastroesophageal reflux disease)   . Hypertension   . Fibroid uterus     GYN--Dr. Rivard  . Recurrent upper respiratory infection (URI)     in October - tx with abx  . Anemia     on iron  . Back pain     tx with ibuprofen 800mg   . Cough     non-productive  . Bronchitis     treated recently by abx  . Infertility, female   . DUB (dysfunctional uterine bleeding)   . Type II or unspecified type diabetes mellitus without mention of complication, not stated as uncontrolled     diagnosed by Dr. Berdine Addison   Past Surgical History  Procedure Laterality Date  . Myomectomy  2002  . Endometrial vaporization w/ versapoint     Family History  Problem Relation Age of Onset  . Hypertension Mother   . Hypertension Father   . Hypertension Sister   . Thyroid nodules Sister   . Diabetes Maternal Grandmother   . Breast cancer Maternal Grandmother   . Heart disease Maternal Grandmother   . Heart disease Paternal Grandmother    History  Substance Use Topics  . 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 Smokeless tobacco: Never Used  . Alcohol Use:  Yes     Comment: socially 1-2 drinks, once a  month (mixed drinks)   OB History   Grav Para Term Preterm Abortions TAB SAB Ect Mult Living   5 0 0  4 2 2    0     Review of Systems  All other systems reviewed and are negative.     Allergies  Ampicillin; Penicillins; Cephalosporins; and Nitrofuran derivatives  Home Medications   Prior to Admission medications   Medication Sig Start Date End Date Taking? Authorizing Provider  albuterol (PROVENTIL HFA;VENTOLIN HFA) 108 (90 BASE) MCG/ACT inhaler Inhale 2 puffs into the lungs every 6 (six) hours as needed for wheezing or shortness of breath.   Yes Historical Provider, MD  amLODipine (NORVASC) 5 MG tablet Take 2.5 mg by mouth daily.  07/23/13  Yes Historical Provider, MD  cholecalciferol (VITAMIN D) 1000 UNITS tablet Take 1,000 Units by mouth daily.   Yes Historical Provider, MD  FLOVENT HFA 220 MCG/ACT inhaler Inhale 2 puffs into the lungs 2 (two) times daily.  10/08/13  Yes Historical Provider, MD  Prenatal Vit-Fe Fumarate-FA (PRENATAL VITAMIN PO) Take 1 tablet by mouth daily.   Yes Historical Provider, MD   BP 133/83  Pulse 75  Temp(Src) 98.6 F (37 C) (Oral)  Resp 20  SpO2 100%  LMP  12/20/2013 Physical Exam  Nursing note and vitals reviewed. Constitutional: She appears well-developed and well-nourished. No distress.  HENT:  Head: Normocephalic and atraumatic.  Right Ear: External ear normal.  Left Ear: External ear normal.  Eyes: Conjunctivae are normal. Right eye exhibits no discharge. Left eye exhibits no discharge. No scleral icterus.  Neck: Neck supple. No tracheal deviation present.  Cardiovascular: Normal rate, regular rhythm and intact distal pulses.   Pulmonary/Chest: Effort normal. No stridor. No respiratory distress. She has wheezes. She has no rales.  wheezing noted more on inspiration rather than expiration, patient able to speak in full sentences, no stridor  Abdominal: Soft. Bowel sounds are normal. She exhibits  no distension. There is no tenderness. There is no rebound and no guarding.  Musculoskeletal: She exhibits no edema and no tenderness.  Neurological: She is alert. She has normal strength. No cranial nerve deficit (no facial droop, extraocular movements intact, no slurred speech) or sensory deficit. She exhibits normal muscle tone. She displays no seizure activity. Coordination normal.  Skin: Skin is warm and dry. No rash noted.  Psychiatric: She has a normal mood and affect.    ED Course  Procedures   Imaging Review Dg Chest 2 View  01/10/2014   CLINICAL DATA:  Bronchoscopy 2 days ago. Difficulty breathing and swallowing.  EXAM: CHEST  2 VIEW  COMPARISON:  11/18/2013  FINDINGS: Normal heart, mediastinum hila. Clear lungs. No pleural effusion or pneumothorax. The bony thorax is unremarkable.  IMPRESSION: No active cardiopulmonary disease.   Electronically Signed   By: Lajean Manes M.D.   On: 01/10/2014 20:09     EKG Interpretation   Date/Time:  Thursday January 10 2014 16:17:46 EDT Ventricular Rate:  82 PR Interval:  146 QRS Duration: 80 QT Interval:  374 QTC Calculation: 436 R Axis:   65 Text Interpretation:  Normal sinus rhythm Normal ECG No significant change  since last tracing Confirmed by Hiilani Jetter  MD-J, Marteze Vecchio ([id number]) on 01/10/2014  8:15:24 PM      MDM   Final diagnoses:  Bronchospasm, acute    Pt improved with albuterol atrovent treatment and steroids.  No PNA or complication associated with her recent bronchoscopy noted on CXR.  I attempted to review her bronchoscopy results from [place] but there is no note in the records from [place] at this time from the bronchoscopy.  Her pre-op evaluation discussed the concerns for possible upper airway obstruction.  No sign of acute obstruction at this time.  Will dc home with steroids and continue her inhalers.  Follow up with PCP and her doctors at [facility].    Dorie Rank, MD 01/10/14 2030

## 2014-01-10 NOTE — ED Notes (Signed)
Pt states she has been having breathing difficulties for one year.  She just had a bronchoscopy done on Tuesday and then today she had the feeling she could take air in but could not get it out.  PT uses inhaler but not changes./  Pt has wheezing bilaterally, no acute distress.  Pt sats 99% RA

## 2014-01-10 NOTE — ED Notes (Signed)
Pt is current connected to monitor via bp cuff and SpO2 sensor.

## 2014-02-06 ENCOUNTER — Ambulatory Visit (INDEPENDENT_AMBULATORY_CARE_PROVIDER_SITE_OTHER): Payer: Managed Care, Other (non HMO) | Admitting: Medical

## 2014-02-06 ENCOUNTER — Encounter: Payer: Self-pay | Admitting: Medical

## 2014-02-06 ENCOUNTER — Telehealth: Payer: Self-pay | Admitting: Medical

## 2014-02-06 VITALS — BP 120/80 | HR 80 | Temp 98.9°F | Resp 16 | Wt 199.0 lb

## 2014-02-06 DIAGNOSIS — M545 Low back pain, unspecified: Secondary | ICD-10-CM

## 2014-02-06 DIAGNOSIS — R350 Frequency of micturition: Secondary | ICD-10-CM

## 2014-02-06 LAB — POCT URINALYSIS DIPSTICK
Bilirubin, UA: NEGATIVE
GLUCOSE UA: NEGATIVE
KETONES UA: NEGATIVE
Leukocytes, UA: NEGATIVE
NITRITE UA: NEGATIVE
PH UA: 6
SPEC GRAV UA: 1.015
Urobilinogen, UA: NEGATIVE

## 2014-02-06 LAB — POCT URINE PREGNANCY: PREG TEST UR: NEGATIVE

## 2014-02-06 MED ORDER — CIPROFLOXACIN HCL 500 MG PO TABS: 500.0000 mg | ORAL_TABLET | Freq: Two times a day (BID) | ORAL | Status: DC

## 2014-02-06 NOTE — Telephone Encounter (Signed)
Please try and hurt down records from urology. She states today that she had prior a violation for blood in the urine.   I do not have records of this.   She did have blood cells of the microscope today, and we referred her to urology last year she no showed.    Thus we need records on file for prior urology workup.  This is important because there are certain rare conditions that affect the lungs and the kidneys, and in light of her lung/cough issue, this may be important

## 2014-02-06 NOTE — Progress Notes (Signed)
Subjective:  Michelle Mack is a 48 y.o. female who complains of possible urinary tract infection.  Last week had urinary frequency that has continued but now has low back pain over kidneys.  Denies urine odor or cloudy but didn't look.  No blood in urine.   No fever, no NVD.  Has some right lower belly pressure.   No vaginal discharge, no redness or irritation.   Trying to drink at least 6 bottles of water daily.  Back pain not aggravating by activity or motion.  using nothing for symptoms.  Last UTI - can't remember.  Patient does not have a history of pyelonephritis.  Last BM this morning, LMP about a month ago.  No other aggravating or relieving factors.  No other c/o.  Past Medical History  Diagnosis Date  . GERD (gastroesophageal reflux disease)   . Hypertension   . Fibroid uterus     GYN--Dr. Rivard  . Recurrent upper respiratory infection (URI)     in October - tx with abx  . Anemia     on iron  . Back pain     tx with ibuprofen 800mg   . Cough     non-productive  . Bronchitis     treated recently by abx  . Infertility, female   . DUB (dysfunctional uterine bleeding)   . Type II or unspecified type diabetes mellitus without mention of complication, not stated as uncontrolled     diagnosed by Dr. Berdine Addison    ROS as in subjective  Reviewed allergies, medications, past medical, surgical, and social history.    Objective: Filed Vitals:   02/06/14 0932  BP: 120/80  Pulse: 80  Temp: 98.9 F (37.2 C)  Resp: 16    General appearance: alert, no distress, WD/WN, female Abdomen: +bs, soft, mild lower abdomen tenderness in general, otherwise non tender, non distended, no masses, no hepatomegaly, no splenomegaly, no bruits Back: mild bilat CVA tenderness BP:ZWCHENID     Laboratory:  Results for orders placed in visit on 02/06/14 (from the past 24 hour(s))  POCT URINALYSIS DIPSTICK     Status: None   Collection Time    02/06/14  9:34 AM      Result Value Ref Range   Color,  UA yellow     Clarity, UA clear     Glucose, UA neg     Bilirubin, UA neg     Ketones, UA neg     Spec Grav, UA 1.015     Blood, UA moderate     pH, UA 6.0     Protein, UA trace     Urobilinogen, UA negative     Nitrite, UA neg     Leukocytes, UA Negative          Assessment: Encounter Diagnoses  Name Primary?  . Urinary frequency Yes  . Bilateral low back pain without sciatica      Plan: Discussed symptoms.  Suspect UTI, begin Cipro, culture sent.  Of note she has been seen by urology in the past for microscopic hematuria, says no problem identified.   Urine microscopic today shows at least 10 red blood cells per field, no cast or other abnormalities.  Of note she was referred back to urology in 2014 but no showed.  Will request urology records.   She is requesting referral to [facility] for third opinion on chronic cough.   She saw [facility] recently, and says the diagnosis was polychondritis with recommendations to have stent  placed in the airway.   We will request records so we can review first.  Dr. Etheleen Nicks will need to review.

## 2014-02-06 NOTE — Telephone Encounter (Signed)
I have called [facility] and spoke with medical records. Dr. Tomi Bamberger referred Michelle Mack last year and she has no showed/ cancelled Michelle Mack appts on march 4, November 25, December 8. She has never been seen there before.

## 2014-02-08 ENCOUNTER — Other Ambulatory Visit: Payer: Self-pay | Admitting: Medical

## 2014-02-08 ENCOUNTER — Telehealth: Payer: Self-pay | Admitting: Family Medicine

## 2014-02-08 LAB — URINE CULTURE: Colony Count: 70000

## 2014-02-08 MED ORDER — SULFAMETHOXAZOLE-TMP DS 800-160 MG PO TABS: 1.0000 | ORAL_TABLET | Freq: Two times a day (BID) | ORAL | Status: DC

## 2014-02-08 NOTE — Progress Notes (Signed)
I called out the medication to her pharmacy and I also notified the patient. CLS

## 2014-02-08 NOTE — Telephone Encounter (Signed)
Patient called and said that the Cipro is causing her to vomit and she wants to know if you can change her medication to something else?

## 2014-02-21 ENCOUNTER — Telehealth: Payer: Self-pay | Admitting: Family Medicine

## 2014-02-21 NOTE — Telephone Encounter (Signed)
PT called about her referral.  I advised we are awaiting records.  She called [facility] and they faxed 2 days ago.  I requested that they fax [place] again.

## 2014-02-22 ENCOUNTER — Telehealth: Payer: Self-pay | Admitting: Medical

## 2014-02-22 DIAGNOSIS — J398 Other specified diseases of upper respiratory tract: Secondary | ICD-10-CM

## 2014-02-22 DIAGNOSIS — R768 Other specified abnormal immunological findings in serum: Secondary | ICD-10-CM

## 2014-02-22 NOTE — Telephone Encounter (Signed)
Please call patient concerning referral to ? Pulmonologist. Pt went back and forth between saying pulmonary and rheumatology. Records received from [facility] on 02/21/14 PM, went to Dr. Tomi Bamberger. I have placed in your folder Patient VERY irritated because referral not done yet.

## 2014-02-25 ENCOUNTER — Telehealth: Payer: Self-pay | Admitting: Medical

## 2014-02-25 NOTE — Telephone Encounter (Signed)
pls let her know that I was out of the office at the end of last week, we just now got records from [place], and I have handed them over to Dr. Tomi Bamberger (her [facility]) to review.

## 2014-02-25 NOTE — Telephone Encounter (Signed)
Per my last chart note, we had requested records before we could proceed.  Apparently records did come in and are reportedly in Dr. Johnsie Kindred folder.  I'll defer to her.

## 2014-02-25 NOTE — Telephone Encounter (Signed)
I was not aware that any action was required when the records came in--I reviewed and signed them, and they are no longer in my office.  Please return records to my office, along with routing this phone message back

## 2014-02-25 NOTE — Telephone Encounter (Signed)
Michelle Mack said he put on your chair.

## 2014-02-25 NOTE — Telephone Encounter (Signed)
Please call re: referral

## 2014-02-26 ENCOUNTER — Encounter: Payer: Self-pay | Admitting: Medical

## 2014-02-26 DIAGNOSIS — J398 Other specified diseases of upper respiratory tract: Secondary | ICD-10-CM | POA: Insufficient documentation

## 2014-02-26 DIAGNOSIS — R768 Other specified abnormal immunological findings in serum: Secondary | ICD-10-CM | POA: Insufficient documentation

## 2014-02-26 NOTE — Telephone Encounter (Signed)
I left a message on her voicemail.CLS

## 2014-02-26 NOTE — Telephone Encounter (Signed)
[facility] records were reviewed.  She has severe tracheomalacia, and possible relapsing polychondritis. She has severe obstruction on PFT's.   Please make sure that she is remaining on Flovent, as well as prednisone as recommended by [facility] (I believe she was initially put on 40mg , then supposed to be on 20mg  for a month).  She had +ANA, 1:640, speckled pattern.  I believe she may have seen rheumatologist in the past, and I agree with f/u with rheum.  I'm fine with referral to rheumatology (see who she saw before, who she would like to see now, and make sure they have [facility] notes prior to appt).  The differential of findings on CT include polychondritis or possibly Wegener's.  She did have blood in urine, and needs to f/u with urology (vs nephro) as recommended by [place]--I'm not sure if referral was ever done (was done in past, and she didn't ever get seen, as documented in prior message).  She does have a serious pulmonary problem.  I didn't see the recommendation for airway stent placement in the notes provided by [facility], but apparently patient reported this to [place] at last OV.  I'm fine with her getting an additional opinion prior to proceeding with procedure.  As above, please ensure compliant with inhaler and oral steroids in the interim.

## 2014-02-27 ENCOUNTER — Other Ambulatory Visit: Payer: Self-pay | Admitting: *Deleted

## 2014-02-27 NOTE — Telephone Encounter (Signed)
Spoke with patient and went over all details. She would like to see Michaelyn Barter for rheumatology. I had to fax all 3 referrals and these individual places with review records and schedule appt's. Faxed referral for nephro to [place], Dr Trudie Reed and [facility]. Patient notified.

## 2014-03-01 ENCOUNTER — Telehealth: Payer: Self-pay | Admitting: Medical

## 2014-03-01 NOTE — Telephone Encounter (Signed)
Pt called and stated that she started having headaches 4 days ago and this morning she woke up having cheat pain. She states it is radiating from her armpit to her chest. She was ask if she was experiencing shortness of breath and she stated that she always has shortness of breath. Information was relayed to [place] and he stated pt needed to go to a urgent care. Information was relayed to pt. Pt stated understating.

## 2014-04-04 LAB — HM DIABETES EYE EXAM

## 2014-04-08 ENCOUNTER — Encounter: Payer: Self-pay | Admitting: *Deleted

## 2014-04-08 ENCOUNTER — Ambulatory Visit: Payer: Managed Care, Other (non HMO) | Admitting: Family Medicine

## 2014-04-11 ENCOUNTER — Encounter: Payer: Self-pay | Admitting: Family Medicine

## 2014-04-11 ENCOUNTER — Ambulatory Visit (INDEPENDENT_AMBULATORY_CARE_PROVIDER_SITE_OTHER): Payer: Managed Care, Other (non HMO) | Admitting: Family Medicine

## 2014-04-11 VITALS — BP 120/84 | HR 74 | Temp 98.6°F | Ht 63.0 in | Wt 198.0 lb

## 2014-04-11 DIAGNOSIS — R058 Other specified cough: Secondary | ICD-10-CM

## 2014-04-11 DIAGNOSIS — J069 Acute upper respiratory infection, unspecified: Secondary | ICD-10-CM

## 2014-04-11 DIAGNOSIS — R05 Cough: Secondary | ICD-10-CM

## 2014-04-11 DIAGNOSIS — J398 Other specified diseases of upper respiratory tract: Secondary | ICD-10-CM

## 2014-04-11 DIAGNOSIS — R7301 Impaired fasting glucose: Secondary | ICD-10-CM

## 2014-04-11 DIAGNOSIS — R3129 Other microscopic hematuria: Secondary | ICD-10-CM

## 2014-04-11 DIAGNOSIS — R312 Other microscopic hematuria: Secondary | ICD-10-CM

## 2014-04-11 DIAGNOSIS — I10 Essential (primary) hypertension: Secondary | ICD-10-CM

## 2014-04-11 DIAGNOSIS — R053 Chronic cough: Secondary | ICD-10-CM

## 2014-04-11 DIAGNOSIS — R768 Other specified abnormal immunological findings in serum: Secondary | ICD-10-CM

## 2014-04-11 LAB — POCT GLYCOSYLATED HEMOGLOBIN (HGB A1C): HEMOGLOBIN A1C: 6.3

## 2014-04-11 MED ORDER — AZITHROMYCIN 250 MG PO TABS: ORAL_TABLET | ORAL | Status: DC

## 2014-04-11 NOTE — Progress Notes (Signed)
Chief Complaint  Patient presents with  . Nasal Congestion    X 1 MTH IT STARTED FIRST WITH SORE THROAT THAT HAS GOTTEN BETTER BUT NOW SHE HAS CONGESTION WITH COLOR MUCUS  . Cough      She presents today with complaint of cough and breathing.  She started a month ago with a sore throat, then started with headaches, then started with head congestion and cough.  She has some pressure at the top of her nose, but no other sinus pain.  Nasal mucus is white.  Phlegm that she coughs up is green, coughing up green mucus all day long.  Phlegm was thick at first, but has loosened up some.  Denies chest tightness.  She has chronic wheezing, but doesn't feel like that has gotten worse with this illness.  She was using alka selzer plus and theraflu.  Doesn't think it helped much. Used allergy meds in the past without benefit.  Had allergy testing in the past which was reportedly negative.  She never started azathioprine as she was instructed by one of her specialists. She wanted to talk the doctor about her concerns about how it might affect her immune system before starting it (and he went out of town). She just completed prednisone prescription.  Saw rheumtologist yesterday, and dose of prednisone was increased from 30mg  (she hasn't picked it up yet, thinks it might be 30mg  BID).  Hasn't started it yet.  This reportedly was to help her with her breathing and her cough.    Sugars have been in the 140-150 range in the mornings.  Denies that other MD's have checked her A1c since last check here (which was 6.1, back in 08/2013, prior to prednisone).  She has pre-diabetes/IFG and is not currently on any medications for it.  She has pulmonary appt at [facility] 10/27 for second opinion.  She is asking for renal referral as well--the local renal consult was not going to be for 6 months, due to it being "nonurgent".  Denies fevers, chills.  No sick contacts.  Past Medical History  Diagnosis Date  . GERD (gastroesophageal  reflux disease)   . Hypertension   . Fibroid uterus     GYN--Dr. Rivard  . Recurrent upper respiratory infection (URI)     in October - tx with abx  . Anemia     on iron  . Back pain     tx with ibuprofen 800mg   . Cough     non-productive  . Bronchitis     treated recently by abx  . Infertility, female   . DUB (dysfunctional uterine bleeding)   . Type II or unspecified type diabetes mellitus without mention of complication, not stated as uncontrolled     diagnosed by Dr. Berdine Addison   Past Surgical History  Procedure Laterality Date  . Myomectomy  2002  . Endometrial vaporization w/ versapoint    . Bronchoscopy  01/2014    at [facility] expiratory collapse of trachea and focal narrowing in L mainstem bronchus and LUL   History   Social History  . Marital Status: Married    Spouse Name: N/A    Number of Children: N/A  . Years of Education: N/A   Occupational History  . 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 Smokeless tobacco: Never Used  . Alcohol Use: Yes     Comment: socially 1-2 drinks, once a  month (mixed  drinks)  . Drug Use: No  . Sexual Activity: Yes    Birth Control/ Protection: None   Other Topics Concern  . Not on file   Social History Narrative  . No narrative on file   Outpatient Encounter Prescriptions as of 04/11/2014  Medication Sig Note  . amLODipine (NORVASC) 5 MG tablet Take 2.5 mg by mouth daily.  11/12/2013: Taking 1/2 tablet daily  . cholecalciferol (VITAMIN D) 1000 UNITS tablet Take 1,000 Units by mouth daily.   Marland Kitchen FLOVENT HFA 220 MCG/ACT inhaler Inhale 2 puffs into the lungs 2 (two) times daily.    . Prenatal Vit-Fe Fumarate-FA (PRENATAL VITAMIN PO) Take 1 tablet by mouth daily. 01/02/2014: Received from: [facility]:   . [DISCONTINUED] albuterol (PROVENTIL HFA;VENTOLIN HFA) 108 (90 BASE) MCG/ACT inhaler Inhale 2 puffs into the lungs every 6 (six) hours as  needed for wheezing or shortness of breath.   Marland Kitchen azithromycin (ZITHROMAX) 250 MG tablet Take 2 tablets by mouth on first day, then 1 tablet by mouth on days 2 through 5   . [DISCONTINUED] sulfamethoxazole-trimethoprim (BACTRIM DS) 800-160 MG per tablet Take 1 tablet by mouth 2 (two) times daily.   . [DISCONTINUED] albuterol (PROVENTIL) (2.5 MG/3ML) 0.083% nebulizer solution 2.5 mg     (z-pak rx'd today, not pror to visit)  Allergies  Allergen Reactions  . Ampicillin Shortness Of Breath  . Penicillins Shortness Of Breath and Swelling  . Cephalosporins Itching  . Nitrofuran Derivatives Itching    Throat, mouth, hands itch   ROS:  Denies nausea, vomiting, diarrhea, bleeding, bruising, rashes, edema.  No further headaches,  Denies dizziness, chest pain. Breathing at baseline.  Denies fevers, chills, polydipsia, polyuria, vision changes or other concerns, except as noted in HPI.  No urinary complaints.  PHYSICAL EXAM: BP 120/84  Pulse 74  Temp(Src) 98.6 F (37 C) (Oral)  Ht 5\' 3"  (1.6 m)  Wt 198 lb (89.812 kg)  BMI 35.08 kg/m2  SpO2 98%  Well developed, pleasant female in good spirits.  She has frequent sniffling, and occasional deep cough.  She is speaking easily in full sentences, without any distress HEENT: PERRL, EOMI, conjunctiva clear.  Nasal mucosa mildly edematous, no erythema or purulence.  Sinuses are nontender.  OP clear, moist mucus membranes Heart: regular rate and rhythm Lungs clear bilaterally.  No wheezes/rales/ronchi.  Air movement is good Extremities: no edema Back: no CVA tenderness  A1c 6.3  ASSESSMENT/PLAN:  Acute upper respiratory infection - Plan: azithromycin (ZITHROMAX) 250 MG tablet  Productive cough - Plan: azithromycin (ZITHROMAX) 250 MG tablet  Essential hypertension, benign - controlled  Impaired fasting glucose - so far sugars remain okay, despite her prednisone use. Continue to monitor, and f/u here if increasing (to be restarted on meds) - Plan:  POCT glycosylated hemoglobin (Hb A1C)  Tracheomalacia - has pulm 2nd opinion upcoming at [facility]; pt to communicate with her doctors re: her concerns of azathioprine  Chronic cough  Positive ANA (antinuclear antibody) - Dr. Cathey Endow notes not available today. Per pt, will be starting higher dose prednisone  Microscopic hematuria - in the setting of possible autoimmune disease and tracheomalacia--needs renal eval.  Refer elsewhere per pt request, given length until local appointment   z-pak--risks/side effects/how to take and expectations reviewed.  Risks/benefits of immunosuppressant meds reviewed in detail--to contact doctor with her concerns, as she hasn't started recommended treatments.  Discussed risks of prednisone including elevated sugars/diabetes.  A1c is still okay--no need to restart meds  at this point.  Continue to monitor sugars at home, and return if persistently elevated (I am unsure of how long she will remain on prednisone, nor do I know doses).  I will keep my eyes out for recent note from Dr. Trudie Reed.

## 2014-04-11 NOTE — Patient Instructions (Signed)
Take the Zpak as directed.  Call on day 9-10 if you haven't had complete resolution, for a refill.  Call sooner if you are worse (ie fever, worsening cough).  I do NOT expect this to resolve your cough, which has been chronic and ongoing for quite some time.  The antibiotics are supposed to help clear up the green phlegm production.  Continue to take your medications as directed by your other physicians.  Take the new prescription for prednisone as directed by Dr. Trudie Reed.  The prednisone can potentially make your sugars high enough to need medications, so please continue to monitor your sugars regularly.  Your blood pressure is well controlled.

## 2014-04-12 ENCOUNTER — Encounter: Payer: Self-pay | Admitting: Family Medicine

## 2014-04-12 DIAGNOSIS — R3129 Other microscopic hematuria: Secondary | ICD-10-CM | POA: Insufficient documentation

## 2014-04-18 ENCOUNTER — Ambulatory Visit: Payer: Managed Care, Other (non HMO) | Admitting: Medical

## 2014-04-19 ENCOUNTER — Other Ambulatory Visit: Payer: Self-pay

## 2014-04-29 DIAGNOSIS — J988 Other specified respiratory disorders: Secondary | ICD-10-CM | POA: Insufficient documentation

## 2014-04-29 DIAGNOSIS — G4733 Obstructive sleep apnea (adult) (pediatric): Secondary | ICD-10-CM | POA: Insufficient documentation

## 2014-04-29 DIAGNOSIS — N938 Other specified abnormal uterine and vaginal bleeding: Secondary | ICD-10-CM | POA: Insufficient documentation

## 2014-04-29 DIAGNOSIS — R0600 Dyspnea, unspecified: Secondary | ICD-10-CM | POA: Insufficient documentation

## 2014-05-02 DIAGNOSIS — M941 Relapsing polychondritis: Secondary | ICD-10-CM | POA: Insufficient documentation

## 2014-05-06 ENCOUNTER — Encounter: Payer: Self-pay | Admitting: Family Medicine

## 2014-05-15 ENCOUNTER — Encounter: Payer: Managed Care, Other (non HMO) | Admitting: Family Medicine

## 2014-05-22 ENCOUNTER — Ambulatory Visit (INDEPENDENT_AMBULATORY_CARE_PROVIDER_SITE_OTHER): Payer: Managed Care, Other (non HMO) | Admitting: Family Medicine

## 2014-05-22 ENCOUNTER — Encounter: Payer: Self-pay | Admitting: Family Medicine

## 2014-05-22 VITALS — BP 110/64 | HR 68 | Ht 63.0 in | Wt 193.0 lb

## 2014-05-22 DIAGNOSIS — R312 Other microscopic hematuria: Secondary | ICD-10-CM

## 2014-05-22 DIAGNOSIS — R7301 Impaired fasting glucose: Secondary | ICD-10-CM

## 2014-05-22 DIAGNOSIS — G4733 Obstructive sleep apnea (adult) (pediatric): Secondary | ICD-10-CM

## 2014-05-22 DIAGNOSIS — N926 Irregular menstruation, unspecified: Secondary | ICD-10-CM

## 2014-05-22 DIAGNOSIS — I10 Essential (primary) hypertension: Secondary | ICD-10-CM | POA: Insufficient documentation

## 2014-05-22 DIAGNOSIS — J398 Other specified diseases of upper respiratory tract: Secondary | ICD-10-CM

## 2014-05-22 DIAGNOSIS — M941 Relapsing polychondritis: Secondary | ICD-10-CM

## 2014-05-22 DIAGNOSIS — R3129 Other microscopic hematuria: Secondary | ICD-10-CM

## 2014-05-22 DIAGNOSIS — N938 Other specified abnormal uterine and vaginal bleeding: Secondary | ICD-10-CM

## 2014-05-22 LAB — POCT HEMOGLOBIN: Hemoglobin: 12 g/dL — AB (ref 12.2–16.2)

## 2014-05-22 MED ORDER — AMLODIPINE BESYLATE 5 MG PO TABS: 2.5000 mg | ORAL_TABLET | Freq: Every day | ORAL | Status: DC

## 2014-05-22 MED ORDER — HYDROCHLOROTHIAZIDE 12.5 MG PO TABS: 12.5000 mg | ORAL_TABLET | Freq: Every day | ORAL | Status: DC

## 2014-05-22 NOTE — Progress Notes (Signed)
Chief Complaint  Patient presents with  . Med check    6 month non fasting follow up-had A1C at sick visit here last month.   Patient presents for follow up on hypertension and pre-diabetes. BP's are running 120/60's.  Denies dizziness, headaches, side effects of medications.  She has been having vaginal bleeding since 10/30th. Bleeding has been heavy, like a period.  It was very heavy for the first 2 weeks.  It has lightened up now, night seeing much on the pad, but noticing it coming in a clump when she urinates.  Denies blood in the urine or dysuria.  Her GYN is Dr. Cletis Media, last seen 6 months for routine exam.  She is asking to have her iron checked today.  She has put a call into her to get an appointment, and is waiting to hear back.  She denies feeling lightheaded or dizzy. She had a lot of cramping initially, but pain has resolved.  Pre-diabetes:  Sugars have been running <100, in the 90's.  She has made dietary changes--she is now Vegan (x 3 weeks)--cut out cheese and meat (never drank milk, lactose-intolerant).  She has been a little more constipated since change in diet, requiring enema yesterday. She thinks per her scale she has lost 10 pounds (lost 5-6 per our scale).  Tracheomalacia: Had 2nd opinion at [facility].  Per patient, they agreed with recommendation for treatment (azathioprine).  Trying to decide between being treated at [facility].  They didn't want to repeat procedure for fear of requiring hospitalization again. She started the azathioprine, took it for 3 weeks, but stopped it when she got a rash.  She tried to restart it, but when she tried to take it again, it made her throw up.  She saw rheumatologist at [place] yesterday who didn't want her to restart the azathioprine until got clearance by the kidney doctors (at a lower dose to help with tolerability). When Care Everywhere reviewed, what pt stated was a little different than what they recommended. She is currently tapered  down to 2 mg of Prednisone daily.  Her breathing has been really good. She hasn't been coughing or wheezing, shortness of breath improved.  She uses CPAP at night, sleeping well at night without coughing or choking; feels refreshed in the in mornings.  She has appointment with nephrologist next week [facility]).  She saw NP over at [facility] also, and had renal ultrasound.  She never went back for follow-up, but saw result on MyChart.  "I never saw a doctor". Explained function of NP's to patient.  PMH, PSH, SH reviewed. Outpatient Encounter Prescriptions as of 05/22/2014  Medication Sig Note  . amLODipine (NORVASC) 5 MG tablet Take 0.5 tablets (2.5 mg total) by mouth daily.   Marland Kitchen azaTHIOprine (IMURAN) 50 MG tablet Take 150 mg by mouth daily.  05/22/2014: Not currently taking--put on hold by the rheumatologist at [facility]  . cholecalciferol (VITAMIN D) 1000 UNITS tablet Take 1,000 Units by mouth daily.   . formoterol (FORADIL AEROLIZER) 12 MCG capsule for inhaler Take by mouth. 05/22/2014: Once daily  . hydrochlorothiazide (HYDRODIURIL) 12.5 MG tablet Take 1 tablet (12.5 mg total) by mouth daily.   Marland Kitchen ipratropium-albuterol (DUONEB) 0.5-2.5 (3) MG/3ML SOLN As needed 05/22/2014: Uses once daily  . Prenatal Vit-Fe Fumarate-FA (PRENATAL VITAMIN PO) Take 1 tablet by mouth daily. 01/02/2014: Received from: [place]:   . [DISCONTINUED] amLODipine (NORVASC) 5 MG tablet Take 2.5 mg by mouth daily.  11/12/2013: Taking 1/2 tablet daily  . [DISCONTINUED] fluticasone (FLOVENT HFA) 220 MCG/ACT inhaler Inhale 1 puff into the lungs daily.  05/22/2014: Received from: [place]  . [DISCONTINUED] hydrochlorothiazide (HYDRODIURIL) 12.5 MG tablet Take 12.5 mg by mouth daily.  05/22/2014: Received from: [place]  . ibuprofen (ADVIL,MOTRIN) 800 MG tablet Take by mouth. 05/22/2014: Received from: [place]  . [DISCONTINUED]  azithromycin (ZITHROMAX) 250 MG tablet Take 2 tablets by mouth on first day, then 1 tablet by mouth on days 2 through 5   . [DISCONTINUED] FLOVENT HFA 220 MCG/ACT inhaler Inhale 2 puffs into the lungs 2 (two) times daily.  05/22/2014: Stopped/changed   Allergies  Allergen Reactions  . Ampicillin Shortness Of Breath  . Penicillins Shortness Of Breath and Swelling  . Cephalosporins Itching  . Nitrofuran Derivatives Itching    Throat, mouth, hands itch   ROS:  No fever, chills, URI/allergy symptoms, cough, shortness of breath.  No nausea, vomiting, diarrhea.  +constipation since dietary changes made. No edema. No chest pain, headaches, dizziness.  +prolonged vaginal bleeding. No urinary complaints.  No depression, joint pains.  PHYSICAL EXAM: BP 110/64 mmHg  Pulse 68  Ht 5' 3"  (1.6 m)  Wt 193 lb (87.544 kg)  BMI 34.20 kg/m2  LMP 05/03/2014  Well developed, well-appearing female in good spirits. Neck: no lymphadenopathy or mass Heart: regular rate and rhythm without murmur Lungs: clear bilaterally Abdomen: soft, nontender Extremities: no edema, normal pulses Psych: normal mood, affect, hygiene and grooming  Lab Results  Component Value Date   HGBA1C 6.3 04/11/2014   FS Hemoglobin today was 12 (same as in 03/2014)  Other labs reviewed--lipids done 08/2013, Micralb/Cr 09/2013 TSH 08/2013 here, 04/2014 at [facility] b-met 11/2013, c-met in 08/2013 Chem and CBC were done 03/2016 at [place] done yesterday at [facility] rheum  ASSESSMENT/PLAN:  Essential hypertension - controlled on current regimen - Plan: amLODipine (NORVASC) 5 MG tablet, hydrochlorothiazide (HYDRODIURIL) 12.5 MG tablet  Irregular menstrual bleeding - f/u with GYN. Normal Hg today, and recent TSH normal - Plan: Hemoglobin  Impaired fasting glucose - sugars have been normal; prednisone dose has been lowered. no meds needed.  continue low sugar, low carb diet, weight loss  Tracheomalacia - stable/improved  Chronic  polychondritis  Obstructive apnea - doing well on CPAP  Dysfunctional uterine bleeding  Microscopic hematuria - f/u with nephro as planned.  May ultimately need urology eval if no autoimmune/nephro source found for cystoscopy.  Discussed 1229m of calcium daily, from dietary sources and supplements combined. Limit doctors--CHOOSE between the multiple specialists. She has too many specialists actively caring for pt (when they were supposed to just be 2nd opinion)--two pulmonary doctors, two nephrologists, two rheumatologists. There has been some duplication in care/labs.  All reviewed by me today.   Make sure that you are getting 1203mof calcium daily--try and get as much of this through your diet because calcium pills can worsen constipation.  You can try calcium fortified soy or almond milk (these are non-dairy), calcium fortified orange juice.  Also make sure that you are getting enough protein in your diet.  For the constipation--ensure getting at least 5-7 servings of fruits and vegetables, follow a high fiber diet, and make sure that you are drinking at least 6-8 glasses of water daily.  Regular exercise also helps with bowels.  If ongoing problems with constipation, consider taking stool softeners daily (Colace) vs taking a probiotic.    40 minutes spent face  to face with the patient today, more than 1/2 spent counseling, and reviewing with patient the various tests done the many doctors whose results I can see in [place].  F/u with GYN re: bleeding.  F/u here in 6 months, sooner if BP's or sugars are elevated.

## 2014-05-22 NOTE — Patient Instructions (Signed)
  Make sure that you are getting 1200mg  of calcium daily--try and get as much of this through your diet because calcium pills can worsen constipation.  You can try calcium fortified soy or almond milk (these are non-dairy), calcium fortified orange juice.  Also make sure that you are getting enough protein in your diet.  For the constipation--ensure getting at least 5-7 servings of fruits and vegetables, follow a high fiber diet, and make sure that you are drinking at least 6-8 glasses of water daily.  Regular exercise also helps with bowels.  If ongoing problems with constipation, consider taking stool softeners daily (Colace) vs taking a probiotic.    You have too many doctors taking care of you. You need to choose ONE rheumatologist, pulmonologist and nephrologist (kidney doctor), not seeing specialists at different locations beyond the initial second opinion.  Follow up with Dr. Cletis Media regarding your ongoing vaginal bleeding.   Your hemoglobin today was 12 (the same as it was in September, at [facility]) Your thyroid was last checked in October (at [place])

## 2014-06-18 ENCOUNTER — Other Ambulatory Visit: Payer: Self-pay

## 2014-06-18 DIAGNOSIS — Z1231 Encounter for screening mammogram for malignant neoplasm of breast: Secondary | ICD-10-CM

## 2014-07-22 ENCOUNTER — Ambulatory Visit
Admission: RE | Admit: 2014-07-22 | Discharge: 2014-07-22 | Disposition: A | Discharge status: Home / Self Care | Payer: Managed Care, Other (non HMO) | Source: Ambulatory Visit

## 2014-07-22 DIAGNOSIS — Z1231 Encounter for screening mammogram for malignant neoplasm of breast: Secondary | ICD-10-CM

## 2014-08-20 ENCOUNTER — Encounter: Payer: Self-pay | Admitting: Family Medicine

## 2014-08-20 ENCOUNTER — Ambulatory Visit (INDEPENDENT_AMBULATORY_CARE_PROVIDER_SITE_OTHER): Payer: Managed Care, Other (non HMO) | Admitting: Family Medicine

## 2014-08-20 ENCOUNTER — Telehealth: Payer: Self-pay | Admitting: Family Medicine

## 2014-08-20 VITALS — BP 142/90 | HR 99 | Temp 99.9°F

## 2014-08-20 DIAGNOSIS — R3129 Other microscopic hematuria: Secondary | ICD-10-CM

## 2014-08-20 DIAGNOSIS — R312 Other microscopic hematuria: Secondary | ICD-10-CM

## 2014-08-20 DIAGNOSIS — M941 Relapsing polychondritis: Secondary | ICD-10-CM

## 2014-08-20 DIAGNOSIS — N390 Urinary tract infection, site not specified: Secondary | ICD-10-CM

## 2014-08-20 LAB — POCT URINALYSIS DIPSTICK

## 2014-08-20 MED ORDER — SULFAMETHOXAZOLE-TRIMETHOPRIM 800-160 MG PO TABS
1.0000 | ORAL_TABLET | Freq: Two times a day (BID) | ORAL | Status: DC
Start: 2014-08-20 — End: 2014-09-25

## 2014-08-20 NOTE — Patient Instructions (Signed)
Use the DuoNeb solution up to 4 times per day and if trouble call me

## 2014-08-20 NOTE — Telephone Encounter (Signed)
Pt came back to office and stated that she needs something for congestion as well. Pt uses [facility]. Please advise pt, she can be reached at [phone]

## 2014-08-20 NOTE — Telephone Encounter (Signed)
Advise her that increasing the use of the nebulizer should help with her congestion

## 2014-08-20 NOTE — Telephone Encounter (Signed)
Patient informed and verbalized understanding

## 2014-08-20 NOTE — Progress Notes (Signed)
   Subjective:    Patient ID: [name], female    DOB: Apr 20, 1966, 49 y.o.   MRN: [id number]  HPI She is here for consult concerning increased difficulty with cough, congestion and wheezing. This started approximately 3 days ago and she has been using DuoNeb but only once per day. She usually uses this when she gets short of breath. Yesterday she developed some PND, coughing and noted more wheezing but no fever, chills, sore throat. She is being followed at [facility] for her underlying chronic polychondritis and tracheomalacia. She is not on a LABA due to difficulty taking this medication. She also complains of vague intermittent abdominal pain as well as some urinary urgency. She does have a history of hematuria.   Review of Systems     Objective:   Physical Exam Alert and in no distress. Tympanic membranes and canals are normal. Pharyngeal area is normal. Neck is supple without adenopathy or thyromegaly. Cardiac exam shows a regular sinus rhythm without murmurs or gallops. Lungs show harsh expiratory sounds that truly do not sound like wheezing. Urine microscopic did show red cells.        Assessment & Plan:  Chronic polychondritis  Acute UTI - Plan: sulfamethoxazole-trimethoprim (BACTRIM DS,SEPTRA DS) 800-160 MG per tablet, POCT Urinalysis Dipstick  Microscopic hematuria  she is to increase her DuoNeb to as much as 4 times per day to help with her cough and congestion. I will place her on Septra for her UTI although that diagnosis is questionable. She will follow-up based on her symptom relief. May possibly need to refer back to her specialist in [place].

## 2014-08-26 ENCOUNTER — Encounter (HOSPITAL_COMMUNITY): Payer: Self-pay | Admitting: Family Medicine

## 2014-08-26 ENCOUNTER — Emergency Department (HOSPITAL_COMMUNITY)
Admission: EM | Admit: 2014-08-26 | Discharge: 2014-08-26 | Disposition: A | Discharge status: Home / Self Care | Payer: Managed Care, Other (non HMO) | Attending: Emergency Medicine | Admitting: Emergency Medicine

## 2014-08-26 ENCOUNTER — Emergency Department (HOSPITAL_COMMUNITY): Payer: Managed Care, Other (non HMO)

## 2014-08-26 DIAGNOSIS — Z880 Allergy status to penicillin: Secondary | ICD-10-CM | POA: Insufficient documentation

## 2014-08-26 DIAGNOSIS — R05 Cough: Secondary | ICD-10-CM | POA: Diagnosis not present

## 2014-08-26 DIAGNOSIS — Z862 Personal history of diseases of the blood and blood-forming organs and certain disorders involving the immune mechanism: Secondary | ICD-10-CM | POA: Diagnosis not present

## 2014-08-26 DIAGNOSIS — R110 Nausea: Secondary | ICD-10-CM | POA: Insufficient documentation

## 2014-08-26 DIAGNOSIS — Z8719 Personal history of other diseases of the digestive system: Secondary | ICD-10-CM | POA: Insufficient documentation

## 2014-08-26 DIAGNOSIS — Z7952 Long term (current) use of systemic steroids: Secondary | ICD-10-CM | POA: Diagnosis not present

## 2014-08-26 DIAGNOSIS — Z8742 Personal history of other diseases of the female genital tract: Secondary | ICD-10-CM | POA: Diagnosis not present

## 2014-08-26 DIAGNOSIS — Z8709 Personal history of other diseases of the respiratory system: Secondary | ICD-10-CM | POA: Insufficient documentation

## 2014-08-26 DIAGNOSIS — E119 Type 2 diabetes mellitus without complications: Secondary | ICD-10-CM | POA: Insufficient documentation

## 2014-08-26 DIAGNOSIS — I10 Essential (primary) hypertension: Secondary | ICD-10-CM | POA: Diagnosis not present

## 2014-08-26 DIAGNOSIS — R079 Chest pain, unspecified: Secondary | ICD-10-CM | POA: Diagnosis not present

## 2014-08-26 DIAGNOSIS — R059 Cough, unspecified: Secondary | ICD-10-CM

## 2014-08-26 LAB — URINE MICROSCOPIC-ADD ON

## 2014-08-26 LAB — BASIC METABOLIC PANEL
Anion gap: 13 (ref 5–15)
BUN: 15 mg/dL (ref 6–23)
CO2: 23 mmol/L (ref 19–32)
CREATININE: 1.06 mg/dL (ref 0.50–1.10)
Calcium: 9.5 mg/dL (ref 8.4–10.5)
Chloride: 102 mmol/L (ref 96–112)
GFR calc Af Amer: 72 mL/min — ABNORMAL LOW (ref >90)
GFR calc non Af Amer: 62 mL/min — ABNORMAL LOW (ref >90)
Glucose, Bld: 117 mg/dL — ABNORMAL HIGH (ref 70–99)
Potassium: 3.6 mmol/L (ref 3.5–5.1)
SODIUM: 138 mmol/L (ref 135–145)

## 2014-08-26 LAB — I-STAT TROPONIN, ED: Troponin i, poc: 0 ng/mL (ref 0.00–0.08)

## 2014-08-26 LAB — URINALYSIS, ROUTINE W REFLEX MICROSCOPIC
Bilirubin Urine: NEGATIVE
GLUCOSE, UA: NEGATIVE mg/dL
Ketones, ur: NEGATIVE mg/dL
LEUKOCYTES UA: NEGATIVE
Nitrite: NEGATIVE
PH: 5.5 (ref 5.0–8.0)
Protein, ur: NEGATIVE mg/dL
SPECIFIC GRAVITY, URINE: 1.026 (ref 1.005–1.030)
UROBILINOGEN UA: 0.2 mg/dL (ref 0.0–1.0)

## 2014-08-26 LAB — CBC
HEMATOCRIT: 39.2 % (ref 36.0–46.0)
Hemoglobin: 12.6 g/dL (ref 12.0–15.0)
MCH: 26.8 pg (ref 26.0–34.0)
MCHC: 32.1 g/dL (ref 30.0–36.0)
MCV: 83.2 fL (ref 78.0–100.0)
PLATELETS: 359 10*3/uL (ref 150–400)
RBC: 4.71 MIL/uL (ref 3.87–5.11)
RDW: 14.4 % (ref 11.5–15.5)
WBC: 13.7 10*3/uL — ABNORMAL HIGH (ref 4.0–10.5)

## 2014-08-26 MED ORDER — ONDANSETRON 4 MG PO TBDP: 4.0000 mg | ORAL_TABLET | Freq: Three times a day (TID) | ORAL | Status: DC | PRN

## 2014-08-26 MED ORDER — MECLIZINE HCL 25 MG PO TABS: 25.0000 mg | ORAL_TABLET | Freq: Three times a day (TID) | ORAL | Status: DC | PRN

## 2014-08-26 MED ORDER — PREDNISONE 20 MG PO TABS
60.0000 mg | ORAL_TABLET | Freq: Once | ORAL | Status: DC
  Filled 2014-08-26: qty 3

## 2014-08-26 MED ORDER — IPRATROPIUM-ALBUTEROL 0.5-2.5 (3) MG/3ML IN SOLN
3.0000 mL | Freq: Once | RESPIRATORY_TRACT | Status: AC
  Administered 2014-08-26: 3 mL via RESPIRATORY_TRACT
  Filled 2014-08-26: qty 3

## 2014-08-26 MED ORDER — ONDANSETRON 4 MG PO TBDP
4.0000 mg | ORAL_TABLET | Freq: Once | ORAL | Status: AC
  Administered 2014-08-26: 4 mg via ORAL
  Filled 2014-08-26: qty 1

## 2014-08-26 NOTE — ED Provider Notes (Signed)
CSN: [id number]     Arrival date & time 08/26/14  1259 History   First MD Initiated Contact with Patient 08/26/14 1614     Chief Complaint  Patient presents with  . Chest Pain     (Consider location/radiation/quality/duration/timing/severity/associated sxs/prior Treatment) Patient is a 49 y.o. female presenting with chest pain. The history is provided by the patient and medical records.  Chest Pain Associated symptoms: abdominal pain (burning), cough and nausea     This is a 49 year old female with past medical history significant for hypertension, GERD, anemia, diabetes, recurrent URI, presenting to the ED for chest pain and cough for the past week. She states she has had some wheezing at home.  She has used her albuterol treatments without noted relief. She denies any shortness of breath. No palpitations, dizziness, or weakness. Patient has no known cardiac history. She is not a smoker.  Patient also notes some "burning" abdominal pain. States her PCP recently started her on Bactrim for UTI. She states since she began taking as she has had nausea, vomiting, and burning sensation in her abdomen. She denies any melena or hematochezia. No hematemesis. No rashes or discoloration of skin.  States she stopped taking the medicine yesterday. She denies any residual urinary symptoms. No fever or chills.  Past Medical History  Diagnosis Date  . GERD (gastroesophageal reflux disease)   . Hypertension   . Fibroid uterus     GYN--Dr. Rivard  . Recurrent upper respiratory infection (URI)     in October - tx with abx  . Anemia     on iron  . Back pain     tx with ibuprofen 800mg   . Cough     non-productive  . Bronchitis     treated recently by abx  . Infertility, female   . DUB (dysfunctional uterine bleeding)   . Type II or unspecified type diabetes mellitus without mention of complication, not stated as uncontrolled     diagnosed by Dr. Berdine Addison   Past Surgical History  Procedure Laterality  Date  . Myomectomy  2002  . Endometrial vaporization w/ versapoint    . Bronchoscopy  01/2014    at [facility] expiratory collapse of trachea and focal narrowing in L mainstem bronchus and LUL   Family History  Problem Relation Age of Onset  . Hypertension Mother   . Hypertension Father   . Hypertension Sister   . Thyroid nodules Sister   . Diabetes Maternal Grandmother   . Breast cancer Maternal Grandmother   . Heart disease Maternal Grandmother   . Heart disease Paternal Grandmother    History  Substance Use Topics  . 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 Smokeless tobacco: Never Used  . Alcohol Use: Yes     Comment: socially 1-2 drinks, once a  month (mixed drinks)   OB History    Gravida Para Term Preterm AB TAB SAB Ectopic Multiple Living   5 0 0  4 2 2    0     Review of Systems  Respiratory: Positive for cough.   Cardiovascular: Positive for chest pain.  Gastrointestinal: Positive for nausea and abdominal pain (burning).  All other systems reviewed and are negative.     Allergies  Ampicillin; Penicillins; Cephalosporins; Nitrofuran derivatives; and Sulfur  Home Medications   Prior to Admission medications   Medication Sig Start Date End Date Taking? Authorizing Provider  amLODipine (NORVASC) 5 MG tablet Take 0.5 tablets (2.5 mg total) by mouth daily. 05/22/14  Yes [name], MD  azaTHIOprine (IMURAN) 50 MG tablet Take 150 mg by mouth daily.  03/12/14 03/12/15 Yes Historical Provider, MD  cholecalciferol (VITAMIN D) 1000 UNITS tablet Take 1,000 Units by mouth daily.   Yes Historical Provider, MD  formoterol (FORADIL AEROLIZER) 12 MCG capsule for inhaler Take by mouth. 04/30/14  Yes Historical Provider, MD  hydrochlorothiazide (HYDRODIURIL) 12.5 MG tablet Take 1 tablet (12.5 mg total) by mouth daily. 05/22/14  Yes [name], MD  ibuprofen (ADVIL,MOTRIN) 800 MG tablet Take by mouth. 07/15/13  Yes Historical Provider, MD  ipratropium-albuterol (DUONEB) 0.5-2.5 (3) MG/3ML  SOLN Inhale 3 mLs into the lungs every 6 (six) hours as needed (shortness of breath). As needed 04/30/14  Yes Historical Provider, MD  predniSONE (DELTASONE) 20 MG tablet Take 20 mg by mouth daily with breakfast.   Yes Historical Provider, MD  Prenatal Vit-Fe Fumarate-FA (PRENATAL VITAMIN PO) Take 1 tablet by mouth daily.   Yes Historical Provider, MD  sulfamethoxazole-trimethoprim (BACTRIM DS,SEPTRA DS) 800-160 MG per tablet Take 1 tablet by mouth 2 (two) times daily. 08/20/14  Yes Denita Lung, MD   BP 127/63 mmHg  Pulse 84  Temp(Src) 98.3 F (36.8 C)  Resp 16  SpO2 100%  LMP 07/17/2014   Physical Exam  Constitutional: She is oriented to person, place, and time. She appears well-developed and well-nourished. No distress.  HENT:  Head: Normocephalic and atraumatic.  Mouth/Throat: Oropharynx is clear and moist.  No oral lesions or ulcerations  Eyes: Conjunctivae and EOM are normal. Pupils are equal, round, and reactive to light.  Neck: Normal range of motion. Neck supple.  Cardiovascular: Normal rate, regular rhythm and normal heart sounds.   Pulmonary/Chest: Effort normal and breath sounds normal. No respiratory distress. She has no wheezes. She has no rhonchi. She has no rales.  Expiratory wheezes throughout, no distress, speaking in full complete sentences without difficulty  Abdominal: Soft. Bowel sounds are normal. There is no tenderness. There is no guarding and no CVA tenderness.  Abdomen soft, nondistended, no focal tenderness or peritoneal signs  Musculoskeletal: Normal range of motion. She exhibits no edema.  Neurological: She is alert and oriented to person, place, and time.  Skin: Skin is warm. No rash noted. She is not diaphoretic.  No rashes, lesions, ulcerations, or signs of cellulitis  Psychiatric: She has a normal mood and affect.  Nursing note and vitals reviewed.   ED Course  Procedures (including critical care time) Labs Review Labs Reviewed  CBC - Abnormal;  Notable for the following:    WBC 13.7 (*)    All other components within normal limits  BASIC METABOLIC PANEL - Abnormal; Notable for the following:    Glucose, Bld 117 (*)    GFR calc non Af Amer 62 (*)    GFR calc Af Amer 72 (*)    All other components within normal limits  Randolm Idol, ED    Imaging Review Dg Chest 2 View  08/26/2014   CLINICAL DATA:  49 year old female with anterior bilateral chest pain for 3 days with cough and shortness of Breath. Initial encounter.  EXAM: CHEST  2 VIEW  COMPARISON:  01/10/2014.  FINDINGS: Lung volumes are stable and within normal limits. Mediastinal contours are stable and within normal limits. No pneumothorax, pulmonary edema, pleural effusion or confluent pulmonary opacity. No acute osseous abnormality identified.  IMPRESSION: No acute cardiopulmonary abnormality.   Electronically Signed   By: Genevie Ann M.D.   On: 08/26/2014 14:21  EKG Interpretation None      MDM   Final diagnoses:  Chest pain, unspecified chest pain type  Cough  Nausea   49 year old female with chest pain, cough, and wheezing for the past several days. She has no known cardiac history. She is also noted some wheezing at home that is not resolved with her home albuterol treatments.  On exam, patient afebrile and nontoxic in appearance. Her vital signs are stable on room air. She does have diffuse expiratory wheezes.  She also complains of abdominal "burning" and nausea after taking Bactrim for UTI. She denies any current urinary symptoms. Her abdominal exam is benign.  EKG sinus rhythm without acute ischemia. Lab work obtained which is reassuring, troponin negative. Chest x-ray is clear. She was given dose of zofran in the ED with improvement of nausea, currently tolerating PO well.  She was also given duoneb for wheezing.  Vital signs remained stable on room air. At this time, low suspicion for ACS, PE, dissection, or other acute cardiac event. Also low suspicion for  acute or surgical abdominal pathology, likely side effects from Bactrim. Patient's urine today without signs of infection, she does have moderate blood. I have reviewed her prior urine samples, most of which have noted hematuria. Recommended to discontinue Bactrim, do not feel she needs further antibiotics. She will continue her home albuterol treatments and prednisone. She will follow with her primary care physician.  Discussed plan with patient, he/she acknowledged understanding and agreed with plan of care.  Return precautions given for new or worsening symptoms.  Larene Pickett, PA-C 31/54/00 [id number]  Delora Fuel, MD 19/50/93 [id number]

## 2014-08-26 NOTE — Discharge Instructions (Signed)
Take the prescribed medication as directed.  Continue your prednisone and albuterol treatments at home to help with wheezing. Follow-up with your primary care physician. Return to the ED for new or worsening symptoms.

## 2014-08-26 NOTE — ED Notes (Signed)
Pt having burning throughout whole body with chest pain and abd pain. sts she had some wheezing but took her inhaler with relief. sts N,V,D.

## 2014-09-25 ENCOUNTER — Encounter: Payer: Self-pay | Admitting: Family Medicine

## 2014-09-25 ENCOUNTER — Ambulatory Visit (INDEPENDENT_AMBULATORY_CARE_PROVIDER_SITE_OTHER): Payer: Managed Care, Other (non HMO) | Admitting: Family Medicine

## 2014-09-25 VITALS — BP 120/70 | HR 104 | Wt 200.0 lb

## 2014-09-25 DIAGNOSIS — L299 Pruritus, unspecified: Secondary | ICD-10-CM

## 2014-09-25 DIAGNOSIS — M25551 Pain in right hip: Secondary | ICD-10-CM | POA: Diagnosis not present

## 2014-09-25 NOTE — Patient Instructions (Addendum)
Try Claritin or Allegra for the itching.800 mg of ibuprofen 3 times a day as needed for the hip pain.

## 2014-09-25 NOTE — Progress Notes (Signed)
   Subjective:    Patient ID: Michelle Mack, female    DOB: 28-Aug-1965, 49 y.o.   MRN: [id number]  HPI She complains of a two-week history of right low back pain with radiation into the groin.Pain is made worse with any motion of the hip. No previous history of injury. She also has a 4 day history of nasal burning as well as itching in her ears and slight scratchy throat but no true sore throat, earache, fever, chills.   Review of Systems     Objective:   Physical Exam Alert and in no distress. Tympanic membranes and canals are normal. Pharyngeal area is normal. Neck is supple without adenopathy or thyromegaly. Cardiac exam shows a regular sinus rhythm without murmurs or gallops. Lungs are clear to auscultation. Pain on motion of the right hip in any direction. No pain over SI joints. Negative straight leg raising.      Assessment & Plan:  Right hip pain - Plan: DG HIP UNILAT WITH PELVIS 2-3 VIEWS RIGHT  Pruritus Recommend Claritin or Allegra . We'll also wait on the x-ray.

## 2014-10-28 ENCOUNTER — Ambulatory Visit: Payer: Managed Care, Other (non HMO) | Admitting: Family Medicine

## 2014-10-30 ENCOUNTER — Ambulatory Visit (INDEPENDENT_AMBULATORY_CARE_PROVIDER_SITE_OTHER): Payer: Managed Care, Other (non HMO) | Admitting: Family Medicine

## 2014-10-30 ENCOUNTER — Encounter: Payer: Self-pay | Admitting: Family Medicine

## 2014-10-30 VITALS — BP 120/72 | HR 84 | Ht 63.0 in | Wt 201.8 lb

## 2014-10-30 DIAGNOSIS — J398 Other specified diseases of upper respiratory tract: Secondary | ICD-10-CM

## 2014-10-30 DIAGNOSIS — E119 Type 2 diabetes mellitus without complications: Secondary | ICD-10-CM | POA: Diagnosis not present

## 2014-10-30 DIAGNOSIS — E559 Vitamin D deficiency, unspecified: Secondary | ICD-10-CM | POA: Diagnosis not present

## 2014-10-30 DIAGNOSIS — R7301 Impaired fasting glucose: Secondary | ICD-10-CM

## 2014-10-30 DIAGNOSIS — I10 Essential (primary) hypertension: Secondary | ICD-10-CM | POA: Diagnosis not present

## 2014-10-30 LAB — LIPID PANEL
Cholesterol: 174 mg/dL (ref 0–200)
HDL: 48 mg/dL (ref >=46)
LDL CALC: 102 mg/dL — AB (ref 0–99)
Total CHOL/HDL Ratio: 3.6 Ratio
Triglycerides: 118 mg/dL (ref <150)
VLDL: 24 mg/dL (ref 0–40)

## 2014-10-30 LAB — POCT GLYCOSYLATED HEMOGLOBIN (HGB A1C): Hemoglobin A1C: 6.9

## 2014-10-30 MED ORDER — METFORMIN HCL ER 500 MG PO TB24: 500.0000 mg | ORAL_TABLET | Freq: Every day | ORAL | Status: DC

## 2014-10-30 NOTE — Progress Notes (Signed)
Chief Complaint  Patient presents with  . Blood Sugar Problem    checks blood sugars daily-over the last week or so numbers were in the 150's.    She was last seen by me in 05/2014.  At that time her prednisone dose had been lowered, and sugars had been normal.  Therefore, she was kept off medicatoins, told to continue low sugar, low carb diet, and weight loss was recommended.  She presents today with complaints of higher blood sugars. Sugars have been in the 150's in the mornings, as well as other times during the day.  Denies any change in diet.  Sugars had been 119-130 prior to last week.  She has been having ongoing problems with shortness of breath related to her tracheomalacia.  She is under the care of Dr. Early Osmond at [facility].  She last saw him in March, at which time she was tapering down off prednisone, and was started on [employer].  She had a recurrence of symptoms after taper, and was put back on prednisone, currently on 20mg  daily since last week.  It appears that they are considering starting Cellcept. (no notes were sent to me, but I was able to access them today through [facility]).  She had been on metformin in the past, changed to Tradjenta due to side effects (frequent stools).  That was for treating pre-diabetes, never actually diagnosed with diabetes until this point. She has been having some constipation, and willing to re-try metformin.  Goes to the gym 5x/week for 30 minutes of cardio, and 30 minutes of P30 (cardio and weights).  PMH, PSH, SH reviewed  Outpatient Encounter Prescriptions as of 10/30/2014  Medication Sig Note  . amLODipine (NORVASC) 5 MG tablet Take 0.5 tablets (2.5 mg total) by mouth daily. (Patient taking differently: Take 5 mg by mouth daily. ) 10/30/2014: Taking full tablet daily  . Fluticasone Furoate-Vilanterol 200-25 MCG/INH AEPB Inhale 1 puff into the lungs. 10/30/2014: Breo Ellipta (from [facility])  . hydrochlorothiazide (HYDRODIURIL) 12.5 MG tablet Take 1  tablet (12.5 mg total) by mouth daily.   . predniSONE (DELTASONE) 20 MG tablet TAKE 1 TABLET (20 MG TOTAL) BY MOUTH 2 TIMES DAILY FOR 10 DOSES. 10/30/2014: Taking 20mg  just once daily since last week (after flare after tapered off)  . Prenatal Vit-Fe Fumarate-FA (PRENATAL VITAMIN PO) Take 1 tablet by mouth daily. 08/26/2014: .  Marland Kitchen Vitamin D, Ergocalciferol, (DRISDOL) 50000 UNITS CAPS capsule  10/30/2014: Taking x 4 weeks from nephro (Dr. Posey Pronto)  . [DISCONTINUED] cholecalciferol (VITAMIN D) 1000 UNITS tablet Take 1,000 Units by mouth daily.   . [DISCONTINUED] Fluticasone Furoate-Vilanterol (BREO ELLIPTA IN) Inhale 1 puff into the lungs daily.   . [DISCONTINUED] formoterol (FORADIL AEROLIZER) 12 MCG capsule for inhaler Take by mouth. 08/26/2014: .  Marland Kitchen [DISCONTINUED] ipratropium-albuterol (DUONEB) 0.5-2.5 (3) MG/3ML SOLN 3 mLs. 10/30/2014: Received from: [facility]  . [DISCONTINUED] Multiple Vitamin (MULTIVITAMIN) capsule Take 1 capsule by mouth. 10/30/2014: Received from: [facility]  . ibuprofen (ADVIL,MOTRIN) 800 MG tablet Take by mouth. 08/26/2014: .   . ipratropium-albuterol (DUONEB) 0.5-2.5 (3) MG/3ML SOLN Inhale 3 mLs into the lungs every 6 (six) hours as needed (shortness of breath). As needed 08/26/2014: .  Marland Kitchen [DISCONTINUED] azaTHIOprine (IMURAN) 50 MG tablet Take 150 mg by mouth daily.  08/26/2014: .  Marland Kitchen [DISCONTINUED] Cholecalciferol 1000 UNITS tablet Take 1,000 Units by mouth. 10/30/2014: Received from: [facility]  . [DISCONTINUED] hydrochlorothiazide (MICROZIDE) 12.5 MG capsule Take  12.5 mg by mouth. 10/30/2014: Received from: [facility]  . [DISCONTINUED] meclizine (ANTIVERT) 25 MG tablet Take 1 tablet (25 mg total) by mouth 3 (three) times daily as needed. (Patient not taking: Reported on 09/25/2014)   . [DISCONTINUED] ondansetron (ZOFRAN ODT) 4 MG disintegrating tablet Take 1 tablet (4 mg total) by mouth every 8  (eight) hours as needed for nausea. (Patient not taking: Reported on 09/25/2014)   . [DISCONTINUED] predniSONE (DELTASONE) 20 MG tablet Take 20 mg by mouth daily with breakfast.     Allergies  Allergen Reactions  . Ampicillin Shortness Of Breath  . Penicillins Shortness Of Breath and Swelling  . Cephalosporins Itching  . Nitrofuran Derivatives Itching    Throat, mouth, hands itch  . Sulfur Other (See Comments)    Feeling of burning on the inside  Persist ant cough   ROS: no fevers, chills, headaches, chest pain. Breathing improved since going back on prednisone. No chest pain, nausea, vomiting, polydipsia, polyuria, vision changes or other complaints. See HPI  PHYSICAL EXAM: BP 120/72 mmHg  Pulse 84  Ht 5\' 3"  (1.6 m)  Wt 201 lb 12.8 oz (91.536 kg)  BMI 35.76 kg/m2  LMP 10/13/2014 Well developed, pleasant, obese female in no distress Normal mood, affect, hygiene and grooming Remainder of visit limited to counseling/discussion/education.  Lab Results  Component Value Date   HGBA1C 6.9 10/30/2014     Chemistry      Component Value Date/Time   NA 138 08/26/2014 1313   K 3.6 08/26/2014 1313   CL 102 08/26/2014 1313   CO2 23 08/26/2014 1313   BUN 15 08/26/2014 1313   CREATININE 1.06 08/26/2014 1313   CREATININE 0.86 08/10/2013 0937      Component Value Date/Time   CALCIUM 9.5 08/26/2014 1313   ALKPHOS 76 08/10/2013 0937   AST 17 08/10/2013 0937   ALT 19 08/10/2013 0937   BILITOT 0.7 08/10/2013 0937     ASSESSMENT/PLAN:  Type 2 diabetes mellitus without complication - new diagnosis (previously just IFG); sugars exacerbated by prednisone use. counseled re: diet, exercise, weight loss, complications. - Plan: metFORMIN (GLUCOPHAGE-XR) 500 MG 24 hr tablet, Lipid panel, Amb ref to Medical Nutrition Therapy-MNT  Essential hypertension, benign - controlled.  consider change to ACEI given new dx of DM. await nephro records  Tracheomalacia  IFG (impaired fasting glucose) -  Plan: HgB A1c  Vitamin D deficiency - currently on treatment from nephro   Start metformin ER 500mg  daily. Risks/side effects were reviewed.  Consider adding/changing hypertensive regimen to include ACEI or ARB.  Need to touch base with her nephro (Dr. Posey Pronto at [facility])  Sign ROR for [facility]  Risks/side effects of meds and risks of diabetes, complications. Diet. Weight loss encouraged.  Continue daily exercise. Refer to Diabetes education at [facility].  30 minute visit, more than 1/2 spent counseling   ADDENDUM: Records from [facility] received.  She only had 1 visit in 08/2014. Complement C4 was elevated. Cr was normal at 0.81 Vitamin D-OH was 14.6

## 2014-10-30 NOTE — Patient Instructions (Addendum)
We are referring you to Diabetes Education classes.  If you don't hear from them within the week, please let us know. Please cut back/avoid drinking sweet tea, regular sodas, juices. Drink more water instead.  Start taking Metformin once daily with breakfast.  If your stools are too loose, add Metamucil to your routine.  Continue to check your sugars twice daily--once fasting the in the morning, and once 2 hours after a meal, or at bedtime.  Keep a list--have columns for the date, morning, evening/after and a comments section.  This is where you can explain outliers (example--a sugar of 250 after eating birthday cake, or missed taking metformin). Bring your list of blood sugars to your next visit.

## 2014-11-06 ENCOUNTER — Telehealth: Payer: Self-pay | Admitting: *Deleted

## 2014-11-06 ENCOUNTER — Encounter: Payer: Self-pay | Admitting: Family Medicine

## 2014-11-06 DIAGNOSIS — N182 Chronic kidney disease, stage 2 (mild): Secondary | ICD-10-CM | POA: Insufficient documentation

## 2014-11-06 NOTE — Telephone Encounter (Signed)
Patient advised.

## 2014-11-06 NOTE — Telephone Encounter (Signed)
She only had u/s done elsewhere, which didn't look at this. She did have CT abdomen in 2012 by Dr. Collene Mares and pancreas was normal.  We do NOT recommend routine scans to screen for pancreatic cancer. This is a lot of radiation (and she has had a lot of chest CT's, x-rays etc).  I'm happy to discuss with her, or she can discuss with her GI (looks like it was Dr. Collene Mares who previously ordered her abd CT)

## 2014-11-06 NOTE — Telephone Encounter (Signed)
Patient called and stated that she saw Dr.Patel @ [facility] yesterday and he told her to call PCP and ask for scan to check her pancreas. I told her that if he was requesting a scan that he could order. She then told me that he wasn't requesting but she just wants one done. She says that her mother passed past year from pancreatic cancer and she had the same pain in her chest and back that no one could figure out. She is concerned that she too may have pancreatic cancer.

## 2014-11-06 NOTE — Telephone Encounter (Signed)
Looked in [place] and I do not see anything pertaining to this area-I CANNOT print anything from [place], sorry.

## 2014-11-07 LAB — HM PAP SMEAR: HM PAP: NEGATIVE

## 2014-12-04 ENCOUNTER — Ambulatory Visit (INDEPENDENT_AMBULATORY_CARE_PROVIDER_SITE_OTHER): Payer: Managed Care, Other (non HMO) | Admitting: Family Medicine

## 2014-12-04 ENCOUNTER — Encounter: Payer: Self-pay | Admitting: Family Medicine

## 2014-12-04 VITALS — BP 138/70 | HR 72 | Ht 63.0 in | Wt 206.8 lb

## 2014-12-04 DIAGNOSIS — I10 Essential (primary) hypertension: Secondary | ICD-10-CM | POA: Diagnosis not present

## 2014-12-04 DIAGNOSIS — E785 Hyperlipidemia, unspecified: Secondary | ICD-10-CM | POA: Diagnosis not present

## 2014-12-04 DIAGNOSIS — R32 Unspecified urinary incontinence: Secondary | ICD-10-CM | POA: Diagnosis not present

## 2014-12-04 DIAGNOSIS — N182 Chronic kidney disease, stage 2 (mild): Secondary | ICD-10-CM | POA: Diagnosis not present

## 2014-12-04 DIAGNOSIS — E119 Type 2 diabetes mellitus without complications: Secondary | ICD-10-CM | POA: Diagnosis not present

## 2014-12-04 LAB — POCT URINALYSIS DIPSTICK
BILIRUBIN UA: NEGATIVE
GLUCOSE UA: NEGATIVE
Ketones, UA: NEGATIVE
LEUKOCYTES UA: NEGATIVE
Nitrite, UA: NEGATIVE
PH UA: 6
PROTEIN UA: NEGATIVE
Spec Grav, UA: 1.025
UROBILINOGEN UA: NEGATIVE

## 2014-12-04 NOTE — Progress Notes (Signed)
Chief Complaint  Patient presents with  . Diabetes    1 month follow up.    She was started on Metformin last month, for new diagnosis of diabetes (previously was pre-diabetic).  Denies any GI side effects.  She has had 5 episodes of urinary incontinence at night, with some leaking also during the day (feels trickling while at work, not related to lifting, sneezing, or full bladder).  She has noticed cloudy urine, but no odor, no dysuria, hematuria, urgency/frequency.  Sugars are 88-135 fasting; before lunch 71-161, mostly 120's; before dinner 92-145, twice had higher (160, 189); evenings, sometimes were over 200, related to what she ate.  Her papers (see scanned sheets) also include her meals.  Noted is an abundance of burgers, hot dog, ribs, rice, sausage, eggs.  There isn't much in the way of vegetables. She was also referred to dietician--she states she never received a phone call to set up that appointment (per chart, multiple messages were left, likely at her home number).  PMH, PSH, SH reviewed  Outpatient Encounter Prescriptions as of 12/04/2014  Medication Sig Note  . amLODipine (NORVASC) 5 MG tablet Take 0.5 tablets (2.5 mg total) by mouth daily. (Patient taking differently: Take 5 mg by mouth daily. ) 10/30/2014: Taking full tablet daily  . Fluticasone Furoate-Vilanterol 200-25 MCG/INH AEPB Inhale 1 puff into the lungs. 10/30/2014: Breo Ellipta (from [facility])  . hydrochlorothiazide (HYDRODIURIL) 12.5 MG tablet Take 1 tablet (12.5 mg total) by mouth daily.   . metFORMIN (GLUCOPHAGE-XR) 500 MG 24 hr tablet Take 1 tablet (500 mg total) by mouth daily with breakfast.   . predniSONE (DELTASONE) 20 MG tablet Take 20 mg by mouth daily with breakfast.  12/04/2014: Received from: [facility]  . Prenatal Vit-Fe Fumarate-FA (PRENATAL VITAMIN PO) Take 1 tablet by mouth daily. 08/26/2014: .  . ibuprofen (ADVIL,MOTRIN) 800 MG tablet Take by mouth. 08/26/2014: .   . ipratropium-albuterol  (DUONEB) 0.5-2.5 (3) MG/3ML SOLN Inhale 3 mLs into the lungs every 6 (six) hours as needed (shortness of breath). As needed 08/26/2014: .  . mycophenolate (CELLCEPT) 500 MG tablet Take 500 mg by mouth. 12/04/2014: Received from: [facility]  . [DISCONTINUED] predniSONE (DELTASONE) 20 MG tablet TAKE 1 TABLET (20 MG TOTAL) BY MOUTH 2 TIMES DAILY FOR 10 DOSES. 10/30/2014: Taking 20mg  just once daily since last week (after flare after tapered off)  . [DISCONTINUED] Vitamin D, Ergocalciferol, (DRISDOL) 50000 UNITS CAPS capsule  10/30/2014: Taking x 4 weeks from nephro (Dr. Posey Pronto)   No facility-administered encounter medications on file as of 12/04/2014.   Allergies  Allergen Reactions  . Ampicillin Shortness Of Breath  . Penicillins Shortness Of Breath and Swelling  . Cephalosporins Itching  . Nitrofuran Derivatives Itching    Throat, mouth, hands itch  . Sulfur Other (See Comments)    Feeling of burning on the inside  Persist ant cough   ROS: no fevers, chills, chest pain, URI symptoms, nausea, vomiting, diarrhea.  Urinary incontinence as per HPI.  No bleeding, bruising, rash, edema or other concerns.  PHYSICAL EXAM: BP 138/70 mmHg  Pulse 72  Ht 5\' 3"  (1.6 m)  Wt 206 lb 12.8 oz (93.804 kg)  BMI 36.64 kg/m2  LMP 11/24/2014 Well developed, pleasant female, obese, in no distress Neck: no lymphadenopathy or mass Heart: regular rate and rhythm Lungs: clear bilaterally Back: no CVA tenderness Abdomen: soft, nontender, no mass  Urine dip: 2+ blood  ASSESSMENT/PLAN:  Urinary incontinence, unspecified incontinence  type - Plan: Urine culture, POCT Urinalysis Dipstick  Essential hypertension, benign  CKD (chronic kidney disease) stage 2, GFR 60-89 ml/min  Type 2 diabetes mellitus without complication - Plan: Amb ref to Medical Nutrition Therapy-MNT   Reviewed low cholesterol diet; cut back on carbs in diet, and eat more vegetables. She had LDL slightly over 100 on last  check. Plan to recheck in 3 mos. Will check on referral to nutritionist. (pt never got messages left on home phone, and referral was cancelled--doing another one, leaving different phone number).  Urinary incontinence--I believe her hematuria is chronic (and why she sees nephro).  Send for culture to r/o infection. Discussed decreased fluid intake before bed, frequent voiding during day  Diabetes--overall controlled.  Some evening numbers are elevated, related to poor dietary choices. Needs to see nutritionist--new referral made  F/u 3 months with fasting labs prior

## 2014-12-04 NOTE — Patient Instructions (Addendum)
Please try and incorporate more vegetables and salads in your diet, and cut back on carbs (rolls on your burgers/hot dogs, decrease quantity of rice, etc). We also discussed lowering the cholesterol in your diet (so we can avoid starting cholesterol-lowering medication).  This means eating fewer eggs (yolks, the whites are fine), cheese, burgers, sausage, hot dogs.  Eating more chicken, ground [place] (ie [place] burgers), fish. Also the sausage, hot dogs are high in salt, so limiting this intake to keep your blood pressure down is a good idea.  We will make sure that you get set up with nutritionist.  Fat and Cholesterol Control Diet Fat and cholesterol levels in your blood and organs are influenced by your diet. High levels of fat and cholesterol may lead to diseases of the heart, small and large blood vessels, gallbladder, liver, and pancreas. CONTROLLING FAT AND CHOLESTEROL WITH DIET Although exercise and lifestyle factors are important, your diet is key. That is because certain foods are known to raise cholesterol and others to lower it. The goal is to balance foods for their effect on cholesterol and more importantly, to replace saturated and trans fat with other types of fat, such as monounsaturated fat, polyunsaturated fat, and omega-3 fatty acids. On average, a person should consume no more than 15 to 17 g of saturated fat daily. Saturated and trans fats are considered "bad" fats, and they will raise LDL cholesterol. Saturated fats are primarily found in animal products such as meats, butter, and cream. However, that does not mean you need to give up all your favorite foods. Today, there are good tasting, low-fat, low-cholesterol substitutes for most of the things you like to eat. Choose low-fat or nonfat alternatives. Choose round or loin cuts of red meat. These types of cuts are lowest in fat and cholesterol. Chicken (without the skin), fish, veal, and ground [place] breast are great choices.  Eliminate fatty meats, such as hot dogs and salami. Even shellfish have little or no saturated fat. Have a 3 oz (85 g) portion when you eat lean meat, poultry, or fish. Trans fats are also called "partially hydrogenated oils." They are oils that have been scientifically manipulated so that they are solid at room temperature resulting in a longer shelf life and improved taste and texture of foods in which they are added. Trans fats are found in stick margarine, some tub margarines, cookies, crackers, and baked goods.  When baking and cooking, oils are a great substitute for butter. The monounsaturated oils are especially beneficial since it is believed they lower LDL and raise HDL. The oils you should avoid entirely are saturated tropical oils, such as coconut and palm.  Remember to eat a lot from food groups that are naturally free of saturated and trans fat, including fish, fruit, vegetables, beans, grains (barley, rice, couscous, bulgur wheat), and pasta (without cream sauces).  IDENTIFYING FOODS THAT LOWER FAT AND CHOLESTEROL  Soluble fiber may lower your cholesterol. This type of fiber is found in fruits such as apples, vegetables such as broccoli, potatoes, and carrots, legumes such as beans, peas, and lentils, and grains such as barley. Foods fortified with plant sterols (phytosterol) may also lower cholesterol. You should eat at least 2 g per day of these foods for a cholesterol lowering effect.  Read package labels to identify low-saturated fats, trans fat free, and low-fat foods at the supermarket. Select cheeses that have only 2 to 3 g saturated fat per ounce. Use a heart-healthy tub margarine that is  free of trans fats or partially hydrogenated oil. When buying baked goods (cookies, crackers), avoid partially hydrogenated oils. Breads and muffins should be made from whole grains (whole-wheat or whole oat flour, instead of "flour" or "enriched flour"). Buy non-creamy canned soups with reduced salt  and no added fats.  FOOD PREPARATION TECHNIQUES  Never deep-fry. If you must fry, either stir-fry, which uses very little fat, or use non-stick cooking sprays. When possible, broil, bake, or roast meats, and steam vegetables. Instead of putting butter or margarine on vegetables, use lemon and herbs, applesauce, and cinnamon (for squash and sweet potatoes). Use nonfat yogurt, salsa, and low-fat dressings for salads.  LOW-SATURATED FAT / LOW-FAT FOOD SUBSTITUTES Meats / Saturated Fat (g)  Avoid: Steak, marbled (3 oz/85 g) / 11 g  Choose: Steak, lean (3 oz/85 g) / 4 g  Avoid: Hamburger (3 oz/85 g) / 7 g  Choose: Hamburger, lean (3 oz/85 g) / 5 g  Avoid: Ham (3 oz/85 g) / 6 g  Choose: Ham, lean cut (3 oz/85 g) / 2.4 g  Avoid: Chicken, with skin, dark meat (3 oz/85 g) / 4 g  Choose: Chicken, skin removed, dark meat (3 oz/85 g) / 2 g  Avoid: Chicken, with skin, light meat (3 oz/85 g) / 2.5 g  Choose: Chicken, skin removed, light meat (3 oz/85 g) / 1 g Dairy / Saturated Fat (g)  Avoid: Whole milk (1 cup) / 5 g  Choose: Low-fat milk, 2% (1 cup) / 3 g  Choose: Low-fat milk, 1% (1 cup) / 1.5 g  Choose: Skim milk (1 cup) / 0.3 g  Avoid: Hard cheese (1 oz/28 g) / 6 g  Choose: Skim milk cheese (1 oz/28 g) / 2 to 3 g  Avoid: Cottage cheese, 4% fat (1 cup) / 6.5 g  Choose: Low-fat cottage cheese, 1% fat (1 cup) / 1.5 g  Avoid: Ice cream (1 cup) / 9 g  Choose: Sherbet (1 cup) / 2.5 g  Choose: Nonfat frozen yogurt (1 cup) / 0.3 g  Choose: Frozen fruit bar / trace  Avoid: Whipped cream (1 tbs) / 3.5 g  Choose: Nondairy whipped topping (1 tbs) / 1 g Condiments / Saturated Fat (g)  Avoid: Mayonnaise (1 tbs) / 2 g  Choose: Low-fat mayonnaise (1 tbs) / 1 g  Avoid: Butter (1 tbs) / 7 g  Choose: Extra light margarine (1 tbs) / 1 g  Avoid: Coconut oil (1 tbs) / 11.8 g  Choose: Olive oil (1 tbs) / 1.8 g  Choose: Corn oil (1 tbs) / 1.7 g  Choose: Safflower oil (1 tbs) / 1.2  g  Choose: Sunflower oil (1 tbs) / 1.4 g  Choose: Soybean oil (1 tbs) / 2.4 g  Choose: Canola oil (1 tbs) / 1 g Document Released: 06/21/2005 Document Revised: 10/16/2012 Document Reviewed: 09/19/2013 [employer] Patient Information 2015 [place], [place]. This information is not intended to replace advice given to you by your health care provider. Make sure you discuss any questions you have with your health care provider.  Low-Sodium Eating Plan Sodium raises blood pressure and causes water to be held in the body. Getting less sodium from food will help lower your blood pressure, reduce any swelling, and protect your heart, liver, and kidneys. We get sodium by adding salt (sodium chloride) to food. Most of our sodium comes from canned, boxed, and frozen foods. Restaurant foods, fast foods, and pizza are also very high in sodium. Even if you take  medicine to lower your blood pressure or to reduce fluid in your body, getting less sodium from your food is important. WHAT IS MY PLAN? Most people should limit their sodium intake to 2,300 mg a day. Your health care provider recommends that you limit your sodium intake to __________ a day.  WHAT DO I NEED TO KNOW ABOUT THIS EATING PLAN? For the low-sodium eating plan, you will follow these general guidelines:  Choose foods with a % Daily Value for sodium of less than 5% (as listed on the food label).   Use salt-free seasonings or herbs instead of table salt or sea salt.   Check with your health care provider or pharmacist before using salt substitutes.   Eat fresh foods.  Eat more vegetables and fruits.  Limit canned vegetables. If you do use them, rinse them well to decrease the sodium.   Limit cheese to 1 oz (28 g) per day.   Eat lower-sodium products, often labeled as "lower sodium" or "no salt added."  Avoid foods that contain monosodium glutamate (MSG). MSG is sometimes added to [place] food and some canned foods.  Check food  labels (Nutrition Facts labels) on foods to learn how much sodium is in one serving.  Eat more home-cooked food and less restaurant, buffet, and fast food.  When eating at a restaurant, ask that your food be prepared with less salt or none, if possible.  HOW DO I READ FOOD LABELS FOR SODIUM INFORMATION? The Nutrition Facts label lists the amount of sodium in one serving of the food. If you eat more than one serving, you must multiply the listed amount of sodium by the number of servings. Food labels may also identify foods as:  Sodium free--Less than 5 mg in a serving.  Very low sodium--35 mg or less in a serving.  Low sodium--140 mg or less in a serving.  Light in sodium--50% less sodium in a serving. For example, if a food that usually has 300 mg of sodium is changed to become light in sodium, it will have 150 mg of sodium.  Reduced sodium--25% less sodium in a serving. For example, if a food that usually has 400 mg of sodium is changed to reduced sodium, it will have 300 mg of sodium. WHAT FOODS CAN I EAT? Grains Low-sodium cereals, including oats, puffed wheat and rice, and shredded wheat cereals. Low-sodium crackers. Unsalted rice and pasta. Lower-sodium bread.  Vegetables Frozen or fresh vegetables. Low-sodium or reduced-sodium canned vegetables. Low-sodium or reduced-sodium tomato sauce and paste. Low-sodium or reduced-sodium tomato and vegetable juices.  Fruits Fresh, frozen, and canned fruit. Fruit juice.  Meat and Other Protein Products Low-sodium canned tuna and salmon. Fresh or frozen meat, poultry, seafood, and fish. Lamb. Unsalted nuts. Dried beans, peas, and lentils without added salt. Unsalted canned beans. Homemade soups without salt. Eggs.  Dairy Milk. Soy milk. Ricotta cheese. Low-sodium or reduced-sodium cheeses. Yogurt.  Condiments Fresh and dried herbs and spices. Salt-free seasonings. Onion and garlic powders. Low-sodium varieties of mustard and ketchup.  Lemon juice.  Fats and Oils Reduced-sodium salad dressings. Unsalted butter.  Other Unsalted popcorn and pretzels.  The items listed above may not be a complete list of recommended foods or beverages. Contact your dietitian for more options. WHAT FOODS ARE NOT RECOMMENDED? Grains Instant hot cereals. Bread stuffing, pancake, and biscuit mixes. Croutons. Seasoned rice or pasta mixes. Noodle soup cups. Boxed or frozen macaroni and cheese. Self-rising flour. Regular salted crackers. Vegetables Regular canned vegetables. Regular  canned tomato sauce and paste. Regular tomato and vegetable juices. Frozen vegetables in sauces. Salted french fries. Olives. Angie Fava. Relishes. Sauerkraut. Salsa. Meat and Other Protein Products Salted, canned, smoked, spiced, or pickled meats, seafood, or fish. Bacon, ham, sausage, hot dogs, corned beef, chipped beef, and packaged luncheon meats. Salt pork. Jerky. Pickled herring. Anchovies, regular canned tuna, and sardines. Salted nuts. Dairy Processed cheese and cheese spreads. Cheese curds. Blue cheese and cottage cheese. Buttermilk.  Condiments Onion and garlic salt, seasoned salt, table salt, and sea salt. Canned and packaged gravies. Worcestershire sauce. Tartar sauce. Barbecue sauce. Teriyaki sauce. Soy sauce, including reduced sodium. Steak sauce. Fish sauce. Oyster sauce. Cocktail sauce. Horseradish. Regular ketchup and mustard. Meat flavorings and tenderizers. Bouillon cubes. Hot sauce. Tabasco sauce. Marinades. Taco seasonings. Relishes. Fats and Oils Regular salad dressings. Salted butter. Margarine. Ghee. Bacon fat.  Other Potato and tortilla chips. Corn chips and puffs. Salted popcorn and pretzels. Canned or dried soups. Pizza. Frozen entrees and pot pies.  The items listed above may not be a complete list of foods and beverages to avoid. Contact your dietitian for more information. Document Released: 12/11/2001 Document Revised: 06/26/2013  Document Reviewed: 04/25/2013 [facility] Patient Information 2015 [place], [place]. This information is not intended to replace advice given to you by your health care provider. Make sure you discuss any questions you have with your health care provider.

## 2014-12-06 LAB — URINE CULTURE: Colony Count: 50000

## 2014-12-12 ENCOUNTER — Other Ambulatory Visit: Payer: Self-pay | Admitting: Family Medicine

## 2014-12-13 ENCOUNTER — Encounter: Payer: Self-pay | Admitting: Family Medicine

## 2014-12-19 ENCOUNTER — Ambulatory Visit: Payer: Managed Care, Other (non HMO) | Admitting: *Deleted

## 2014-12-25 ENCOUNTER — Ambulatory Visit (INDEPENDENT_AMBULATORY_CARE_PROVIDER_SITE_OTHER): Payer: Managed Care, Other (non HMO) | Admitting: Family Medicine

## 2014-12-25 ENCOUNTER — Encounter: Payer: Self-pay | Admitting: Family Medicine

## 2014-12-25 VITALS — BP 130/72 | HR 76 | Ht 63.0 in | Wt 202.4 lb

## 2014-12-25 DIAGNOSIS — L731 Pseudofolliculitis barbae: Secondary | ICD-10-CM | POA: Diagnosis not present

## 2014-12-25 NOTE — Progress Notes (Signed)
Chief Complaint  Patient presents with  . Mass    under right arm since last Thursday. In the past when she has changed deodorant she has gotten something similar, but usually goes away in 2-3 days. Not painful, not draining or itchy.     Notes a lump under her righrt armpit for 6 days.  Hasn't gotten significantly larger or painful.  Denies drainage, fevers.  She changed deoderants recently, and she shaves her armpits.  PMH, PSH, SH reviewed. Meds reviewed. Allergies  Allergen Reactions  . Ampicillin Shortness Of Breath  . Penicillins Shortness Of Breath and Swelling  . Cephalosporins Itching  . Nitrofuran Derivatives Itching    Throat, mouth, hands itch  . Sulfur Other (See Comments)    Feeling of burning on the inside  Persist ant cough   ROS:  No fever, chills, nausea, vomiting, rashes or other concerns  PHYSICAL EXAM: BP 130/72 mmHg  Pulse 76  Ht 5\' 3"  (1.6 m)  Wt 202 lb 6.4 oz (91.808 kg)  BMI 35.86 kg/m2  LMP 12/16/2014  Well developed, pleasant female in good spirits, in no distress Right axilla: 2-110mm cutaneous/subcutaneous nodule in the right axilla. No overlying erythema, warmth. Nontender, no fluctuance or induration. Rest of axillary exam is normal.  ASSESSMENT/PLAN:  Ingrown hair   Ingrown hair. May be related to change in deoderant and shaving closely. Try and let the hair grow a little longer in this area. Apply warm compresses. Try and NOT shave against the grain (not as close to the skin) to avoid recurrent ingrown hairs.  If it increases quickly in size, becomes painful and red, then it might be turning into an abscess that requires drainage.  Hopefully this can be avoided by using warm compresses now.

## 2014-12-25 NOTE — Patient Instructions (Signed)
   Ingrown hair. May be related to change in deoderant and shaving closely. Try and let the hair grow a little longer in this area. Apply warm compresses. Try and NOT shave against the grain (not as close to the skin) to avoid recurrent ingrown hairs.  If it increases quickly in size, becomes painful and red, then it might be turning into an abscess that requires drainage.  Hopefully this can be avoided by using warm compresses now.  Ingrown Hair An ingrown hair is a hair that curls and re-enters the skin instead of growing straight out of the skin. It happens most often with curly hair. It is usually more severe in the neck area, but it can occur in any shaved area, including the beard area, groin, scalp, and legs. An ingrown hair may cause small pockets of infection. CAUSES  Shaving closely, tweezing, or waxing, especially curly hair. Using hair removal creams can sometimes lead to ingrown hairs, especially in the groin. SYMPTOMS   Small bumps on the skin. The bumps may be filled with pus.  Pain.  Itching. DIAGNOSIS  Your caregiver can usually tell what is wrong by doing a physical exam. TREATMENT  If there is a severe infection, your caregiver may prescribe antibiotic medicines. Laser hair removal may also be done to help prevent regrowth of the hair. HOME CARE INSTRUCTIONS   Do not shave irritated skin. You may start shaving again once the irritation has gone away.  If you are prone to ingrown hairs, consider not shaving as much as possible.  If antibiotics are prescribed, take them as directed. Finish them even if you start to feel better.  You may use a facial sponge in a gentle circular motion to help dislodge ingrown hairs on the face.  You may use a hair removal cream weekly, especially on the legs and underarms. Stop using the cream if it irritates your skin. Use caution when using hair removal creams in the groin area. SHAVING INSTRUCTIONS AFTER TREATMENT  Shower  before shaving. Keep areas to be shaved packed in warm, moist wraps for several minutes before shaving. The warm, moist environment helps soften the hairs and makes ingrown hairs less likely to occur.  Use thick shaving gels.  Use a bump fighter razor that cuts hair slightly above the skin level or use an electric shaver with a longer shave setting.  Shave in the direction of hair growth. Avoid making multiple razor strokes.  Use moisturizing lotions after shaving. Document Released: 09/27/2000 Document Revised: 12/21/2011 Document Reviewed: 09/21/2011 [facility] Patient Information 2015 [place], [place]. This information is not intended to replace advice given to you by your health care provider. Make sure you discuss any questions you have with your health care provider.

## 2014-12-26 ENCOUNTER — Telehealth: Payer: Self-pay | Admitting: Internal Medicine

## 2014-12-26 DIAGNOSIS — E119 Type 2 diabetes mellitus without complications: Secondary | ICD-10-CM

## 2014-12-26 MED ORDER — METFORMIN HCL ER 500 MG PO TB24: 500.0000 mg | ORAL_TABLET | Freq: Every day | ORAL | Status: DC

## 2014-12-26 NOTE — Telephone Encounter (Signed)
Refill request for metformin 500mg  #90 to [facility]

## 2014-12-26 NOTE — Telephone Encounter (Signed)
Done

## 2014-12-30 ENCOUNTER — Other Ambulatory Visit: Payer: Self-pay

## 2015-01-01 ENCOUNTER — Other Ambulatory Visit (INDEPENDENT_AMBULATORY_CARE_PROVIDER_SITE_OTHER): Payer: Managed Care, Other (non HMO)

## 2015-01-01 DIAGNOSIS — Z111 Encounter for screening for respiratory tuberculosis: Secondary | ICD-10-CM | POA: Diagnosis not present

## 2015-01-03 LAB — TB SKIN TEST
INDURATION: 0 mm
TB Skin Test: NEGATIVE

## 2015-01-28 ENCOUNTER — Encounter: Payer: Managed Care, Other (non HMO) | Attending: Family Medicine | Admitting: Dietician

## 2015-01-28 ENCOUNTER — Encounter: Payer: Self-pay | Admitting: Dietician

## 2015-01-28 VITALS — Ht 64.0 in | Wt 205.0 lb

## 2015-01-28 DIAGNOSIS — E119 Type 2 diabetes mellitus without complications: Secondary | ICD-10-CM | POA: Insufficient documentation

## 2015-01-28 DIAGNOSIS — Z6835 Body mass index (BMI) 35.0-35.9, adult: Secondary | ICD-10-CM | POA: Insufficient documentation

## 2015-01-28 DIAGNOSIS — Z713 Dietary counseling and surveillance: Secondary | ICD-10-CM | POA: Diagnosis not present

## 2015-01-28 NOTE — Patient Instructions (Addendum)
Start back at the gym.  Exercise can help decrease your blood sugar and give you more energy. Consider changing from sweet to unsweetened tea (sweeten with stevia). Aim for 2-3 Carb Choices per meal (30-45 grams) +/- 1 either way  Aim for 0-1 Carbs per snack if hungry  Include protein in moderation with your meals and snacks Consider reading food labels for Total Carbohydrate and Fat Grams of foods Be sure to have regularly scheduled meals (breakfast, lunch, dinner).

## 2015-01-28 NOTE — Progress Notes (Signed)
Diabetes Self-Management Education  Visit Type: First/Initial  Appt. Start Time: 1530 Appt. End Time: 1700  01/28/2015  [name], identified by name and date of birth, is a 49 y.o. female with a diagnosis of Diabetes: Type 2.  Other people present during visit:  Patient   Patient is concerned about her current weight and development of diabetes and would like to learn how to control this better and lose weight.  She is now on Prednisone for polychondritis and has been on prednisone often over the past few years. She reports discouragement after visit to the MD.  She did not lose weight despite going to the gym regularly and response that she would not lose weight on Prednisone.  She reported increased energy when she was exercising but quit due to discouragement.  Patient often skips meals do to being busy at work or not hungry.  She reports that her portion sizes could be reduced.  She works as a Freight forwarder at [employer].  She lives with her husband who has type 2 diabetes and is on Insulin.     TANITA  BODY COMP RESULTS 01/28/15 202 lbs   BMI (kg/m^2) 34.7   Fat Mass (lbs) 87.5   Fat Free Mass (lbs) 114.5   Total Body Water (lbs) 84    ASSESSMENT  Height 5\' 4"  (1.626 m), weight 205 lb (92.987 kg). Body mass index is 35.17 kg/(m^2).  Initial Visit Information:  Are you currently following a meal plan?: No   Are you taking your medications as prescribed?: No Are you checking your feet?: No   How often do you need to have someone help you when you read instructions, pamphlets, or other written materials from your doctor or pharmacy?: 1 - Never What is the last grade level you completed in school?: 12 grade HS  Psychosocial:   Patient Belief/Attitude about Diabetes: Motivated to manage diabetes Self-care barriers: None Self-management support: Doctor's office, Family Other persons present: Patient Patient Concerns: Nutrition/Meal planning, Healthy Lifestyle Special  Needs: None Preferred Learning Style: No preference indicated Learning Readiness: Ready  Complications:   Last HgB A1C per patient/outside source: 6.9 mg/dL (10/30/14) How often do you check your blood sugar?: 1-2 times/day Fasting Blood glucose range (mg/dL): 70-129 Postprandial Blood glucose range (mg/dL): 130-179 Number of hypoglycemic episodes per month: 0 Number of hyperglycemic episodes per week: 0 Have you had a dilated eye exam in the past 12 months?: Yes Have you had a dental exam in the past 12 months?: Yes  Diet Intake:  Breakfast: wheat bread, eggs, steak OR Herbalife shake (2 sccops, 1/2 banana, 1/4 cup strawberries with water or unsweetened almond milk) (7 am) Snack (morning): occasional fruit Lunch: often skips or eats out ([employer] pizza: grilled chicken salad) Snack (afternoon): none Dinner: grilled chicken, salad, rice occasionally,  (7-9) Snack (evening): none Beverage(s): water, sweet tea  Exercise:  Exercise: ADL's  Individualized Plan for Diabetes Self-Management Training:   Learning Objective:  Patient will have a greater understanding of diabetes self-management.  Patient education plan per assessed needs and concerns is to attend individual sessions for     Education Topics Reviewed with Patient Today:  Definition of diabetes, type 1 and 2, and the diagnosis of diabetes Role of diet in the treatment of diabetes and the relationship between the three main macronutrients and blood glucose level, Food label reading, portion sizes and measuring food., Carbohydrate counting Role of exercise on diabetes management, blood pressure control and cardiac health.  Relationship between chronic complications and blood glucose control, Retinopathy and reason for yearly dilated eye exams, Dental care, Assessed and discussed foot care and prevention of foot problems, Lipid levels, blood glucose control and heart disease, Identified and discussed with patient   current chronic complications (patient is having neuropathy symptoms) Role of stress on diabetes, Identified and addressed patients feelings and concerns about diabetes   Lifestyle issues that need to be addressed for better diabetes care  PATIENTS GOALS/Plan (Developed by the patient):  Nutrition: Follow meal plan discussed, General guidelines for healthy choices and portions discussed Physical Activity: Exercise 3-5 times per week, 45 minutes per day Monitoring : test my blood glucose as discussed (note x per day with comment) (twice daily rotating) Reducing Risk: do foot checks daily, examine blood glucose patterns  Plan:   Patient Instructions  Start back at the gym.  Exercise can help decrease your blood sugar and give you more energy. Consider changing from sweet to unsweetened tea (sweeten with stevia). Aim for 2-3 Carb Choices per meal (30-45 grams) +/- 1 either way  Aim for 0-1 Carbs per snack if hungry  Include protein in moderation with your meals and snacks Consider reading food labels for Total Carbohydrate and Fat Grams of foods Be sure to have regularly scheduled meals (breakfast, lunch, dinner).    Expected Outcomes:  Demonstrated interest in learning. Expect positive outcomes  Education material provided: Living Well with Diabetes, Food label handouts, A1C conversion sheet, Meal plan card, My Plate and Snack sheet  If problems or questions, patient to contact team via:  Phone and Email  Future DSME appointment: 4-6 wks

## 2015-03-03 ENCOUNTER — Ambulatory Visit: Payer: Managed Care, Other (non HMO) | Admitting: Dietician

## 2015-03-06 ENCOUNTER — Other Ambulatory Visit: Payer: Managed Care, Other (non HMO)

## 2015-03-06 DIAGNOSIS — E785 Hyperlipidemia, unspecified: Secondary | ICD-10-CM

## 2015-03-06 DIAGNOSIS — I10 Essential (primary) hypertension: Secondary | ICD-10-CM

## 2015-03-06 DIAGNOSIS — N182 Chronic kidney disease, stage 2 (mild): Secondary | ICD-10-CM

## 2015-03-06 DIAGNOSIS — E119 Type 2 diabetes mellitus without complications: Secondary | ICD-10-CM

## 2015-03-06 LAB — LIPID PANEL
Cholesterol: 169 mg/dL (ref 125–200)
HDL: 44 mg/dL — ABNORMAL LOW (ref >=46)
LDL Cholesterol: 101 mg/dL (ref <130)
TRIGLYCERIDES: 120 mg/dL (ref <150)
Total CHOL/HDL Ratio: 3.8 Ratio (ref <=5.0)
VLDL: 24 mg/dL (ref <30)

## 2015-03-06 LAB — COMPREHENSIVE METABOLIC PANEL
ALBUMIN: 3.9 g/dL (ref 3.6–5.1)
ALT: 15 U/L (ref 6–29)
AST: 13 U/L (ref 10–35)
Alkaline Phosphatase: 60 U/L (ref 33–115)
BILIRUBIN TOTAL: 0.5 mg/dL (ref 0.2–1.2)
BUN: 15 mg/dL (ref 7–25)
CHLORIDE: 106 mmol/L (ref 98–110)
CO2: 26 mmol/L (ref 20–31)
CREATININE: 0.88 mg/dL (ref 0.50–1.10)
Calcium: 8.6 mg/dL (ref 8.6–10.2)
Glucose, Bld: 118 mg/dL — ABNORMAL HIGH (ref 65–99)
Potassium: 4 mmol/L (ref 3.5–5.3)
SODIUM: 141 mmol/L (ref 135–146)
TOTAL PROTEIN: 6.6 g/dL (ref 6.1–8.1)

## 2015-03-06 LAB — HEMOGLOBIN A1C
Hgb A1c MFr Bld: 7 % — ABNORMAL HIGH (ref <5.7)
MEAN PLASMA GLUCOSE: 154 mg/dL — AB (ref <117)

## 2015-03-12 ENCOUNTER — Ambulatory Visit (INDEPENDENT_AMBULATORY_CARE_PROVIDER_SITE_OTHER): Payer: Managed Care, Other (non HMO) | Admitting: Family Medicine

## 2015-03-12 ENCOUNTER — Encounter: Payer: Self-pay | Admitting: Family Medicine

## 2015-03-12 VITALS — BP 124/78 | HR 88 | Ht 63.0 in | Wt 200.0 lb

## 2015-03-12 DIAGNOSIS — Z5181 Encounter for therapeutic drug level monitoring: Secondary | ICD-10-CM | POA: Diagnosis not present

## 2015-03-12 DIAGNOSIS — J398 Other specified diseases of upper respiratory tract: Secondary | ICD-10-CM

## 2015-03-12 DIAGNOSIS — R1031 Right lower quadrant pain: Secondary | ICD-10-CM

## 2015-03-12 DIAGNOSIS — E119 Type 2 diabetes mellitus without complications: Secondary | ICD-10-CM | POA: Diagnosis not present

## 2015-03-12 DIAGNOSIS — E7800 Pure hypercholesterolemia, unspecified: Secondary | ICD-10-CM

## 2015-03-12 DIAGNOSIS — E559 Vitamin D deficiency, unspecified: Secondary | ICD-10-CM

## 2015-03-12 DIAGNOSIS — E1129 Type 2 diabetes mellitus with other diabetic kidney complication: Secondary | ICD-10-CM | POA: Diagnosis not present

## 2015-03-12 DIAGNOSIS — N058 Unspecified nephritic syndrome with other morphologic changes: Secondary | ICD-10-CM | POA: Diagnosis not present

## 2015-03-12 DIAGNOSIS — N182 Chronic kidney disease, stage 2 (mild): Secondary | ICD-10-CM | POA: Diagnosis not present

## 2015-03-12 DIAGNOSIS — I10 Essential (primary) hypertension: Secondary | ICD-10-CM

## 2015-03-12 MED ORDER — METFORMIN HCL 500 MG PO TABS: 500.0000 mg | ORAL_TABLET | Freq: Two times a day (BID) | ORAL | Status: DC

## 2015-03-12 MED ORDER — ATORVASTATIN CALCIUM 10 MG PO TABS: 10.0000 mg | ORAL_TABLET | Freq: Every day | ORAL | Status: DC

## 2015-03-12 NOTE — Patient Instructions (Addendum)
Increase your metformin to taking it twice daily (before breakfast and dinner). Continue to monitor your blood sugars twice daily--please bring your list of sugars to your next visit in 3 months.  Start taking the cholesterol medication.  If you develop side effects/muscle aches, start taking coenzyme Q10 daily along with it (this is over-the-counter).  We will set up an ultrasound to evaluate your right sided pelvic pain. Pay attention to see if there are particular foods that might trigger the pain (ie gas-producing foods such as beans, raw vegetables, dairy).  Continue your regular exercise.  Please reconsider taking a flu and pneumonia shot, as we discussed.

## 2015-03-12 NOTE — Progress Notes (Signed)
Chief Complaint  Patient presents with  . Diabetes    nonfasting med check. Labs already done. Patient declined flu shot. Also needs refill on IBU 800mg .    Diabetes:  She saw nutritionist 7/26, and has f/u scheduled.  She didn't really need to make many changes in her diet.  Her appetite isn't great, so portions are not big.  She has been exercising doing a 30 minute circuit Monday through Friday.  She also does 30 minutes on the treadmill before doing the 30 minute circuit (abs, steps, weights).  She enjoys this.  Denies any change in weight or waist size.  She has tapered down to 10mg  of prednisone daily, has been at this dose for a while.  Per patient, plan is to try and taper off now that she is on the [employer].  Sugars in the evenings (before dinner) is in the 160's.  They are running 120 in the morning. She takes metformin (not extended release) just once daily in the morning.  In reviewing her chart, she is a diabetic with hypertension who is not on ACEI or ARB. She previously took Diovan HCT, which was stopped due to cough.  Cough never improved--unrelated to the ARB. Her urine microalbumin was normal on last check. Blood pressure has been controlled on her current regimen.  She is complaining of right sided abdominal pain about 3 times/week for the last 3 weeks.  Lately it has been occuring later in the day, but sometimes earlier. Bowels are normal, no nausea, vomiting. No bloating, gassiness, urinary complaints, vaginal discharge.  Pain lasts only a minute or so, "catches", she bends over and it passes.  2 days ago while at work it happened just one time, other times it might occur more than once.  She had pelvic u/s in 05/2013: IMPRESSION: Enlarged, heterogeneous uterus suggesting the possibility of adenomyosis. No focal fibroids visualized.  She sees Dr. Cletis Media as her GYN, and is UTD.  PMH, PSH, SH reviewed.  Outpatient Encounter Prescriptions as of 03/12/2015  Medication Sig  Note  . amLODipine (NORVASC) 5 MG tablet TAKE 1/2 TABLET BY MOUTH DAILY   . Fluticasone Furoate-Vilanterol 200-25 MCG/INH AEPB Inhale 1 puff into the lungs. 10/30/2014: Breo Ellipta (from [facility])  . hydrochlorothiazide (HYDRODIURIL) 12.5 MG tablet TAKE 1 TABLET (12.5 MG TOTAL) BY MOUTH DAILY.   Marland Kitchen ibuprofen (ADVIL,MOTRIN) 800 MG tablet Take by mouth. 08/26/2014: .   . metFORMIN (GLUCOPHAGE) 500 MG tablet Take 500 mg by mouth daily with breakfast.   . mycophenolate (CELLCEPT) 500 MG tablet Take 500 mg by mouth 2 (two) times daily.  12/04/2014: Received from: [facility]  . predniSONE (DELTASONE) 10 MG tablet Take 10 mg by mouth daily with breakfast.   . [DISCONTINUED] Fluticasone Furoate-Vilanterol 200-25 MCG/INH AEPB Inhale into the lungs. 03/12/2015: Received from: [place]  . [DISCONTINUED] metFORMIN (GLUCOPHAGE-XR) 500 MG 24 hr tablet Take by mouth. 03/12/2015: Received from: [facility]  . [DISCONTINUED] predniSONE (DELTASONE) 20 MG tablet Take 10 mg by mouth. 03/12/2015: [facility]  . ipratropium-albuterol (DUONEB) 0.5-2.5 (3) MG/3ML SOLN Inhale 3 mLs into the lungs every 6 (six) hours as needed (shortness of breath). As needed 08/26/2014: .  Marland Kitchen [DISCONTINUED] metFORMIN (GLUCOPHAGE-XR) 500 MG 24 hr tablet Take 1 tablet (500 mg total) by mouth daily with breakfast. (Patient not taking: Reported on 01/28/2015)   . [DISCONTINUED] Prenatal Vit-Fe Fumarate-FA (PRENATAL VITAMIN PO) Take 1 tablet by mouth  daily. 08/26/2014: .  Marland Kitchen [DISCONTINUED] Vitamin D, Ergocalciferol, (DRISDOL) 50000 UNITS CAPS capsule Take 50,000 Units by mouth every 7 (seven) days.  12/25/2014: Received from: [facility]   No facility-administered encounter medications on file as of 03/12/2015.   Allergies  Allergen Reactions  . Ampicillin Shortness Of Breath  . Penicillins Shortness Of Breath and Swelling  . Cephalosporins  Itching  . Nitrofuran Derivatives Itching    Throat, mouth, hands itch  . Sulfur Other (See Comments)    Feeling of burning on the inside  Persist ant cough   ROS:  No fever, chills, nausea, vomiting, bowel or urinary complaints.  +abdominal pain her HPI.  No headaches, dizziness, chest pain, shortness of breath, bleeding, bruising, rash. See HPI  PHYSICAL EXAM: BP 124/78 mmHg  Pulse 88  Ht 5\' 3"  (1.6 m)  Wt 200 lb (90.719 kg)  BMI 35.44 kg/m2  LMP 03/02/2015 Pleasant female, in no distress.  Appears comfortable, no coughing. Neck: no lymphadenopathy or mass Heart: regular rate and rhythm without murmur Lungs: clear bilaterally Back: no CVA tenderness Abdomen: soft, normal bowel sounds. Mildly tender at RLQ. No mass, rebound or guarding Extremities: no edema Psych: normal mood, affect, hygiene and grooming Neuro: alert and oriented. Normal strength, sensation, gait   Lab Results  Component Value Date   CHOL 169 03/06/2015   HDL 44* 03/06/2015   LDLCALC 101 03/06/2015   TRIG 120 03/06/2015   CHOLHDL 3.8 03/06/2015   Lab Results  Component Value Date   HGBA1C 7.0* 03/06/2015     Chemistry      Component Value Date/Time   NA 141 03/06/2015 0001   K 4.0 03/06/2015 0001   CL 106 03/06/2015 0001   CO2 26 03/06/2015 0001   BUN 15 03/06/2015 0001   CREATININE 0.88 03/06/2015 0001   CREATININE 1.06 08/26/2014 1313      Component Value Date/Time   CALCIUM 8.6 03/06/2015 0001   ALKPHOS 60 03/06/2015 0001   AST 13 03/06/2015 0001   ALT 15 03/06/2015 0001   BILITOT 0.5 03/06/2015 0001     Fasting glucose 118  ASSESSMENT/PLAN:  Type 2 diabetes mellitus, controlled, with renal complications - sugars high in the evenings, only on regular metformin once daily. Change to BID  Essential hypertension, benign - well controlled on current regimen. Consider restarting ARB--monitor microalbumin  Tracheomalacia  Type 2 diabetes mellitus without complication - Plan:  metFORMIN (GLUCOPHAGE) 500 MG tablet, Microalbumin / creatinine urine ratio  CKD (chronic kidney disease) stage 2, GFR 60-89 ml/min  Pure hypercholesterolemia - LDL very slightly above goal, not on a statin--start low dose statin. risks/side effects discussed - Plan: atorvastatin (LIPITOR) 10 MG tablet  RLQ abdominal pain - Plan: US Pelvis Complete, US Transvaginal Non-OB   Diabetes--inadequately controlled with high evening sugars due to only being on a regular acting insulin just in the morning. Contributed by her chronic prednisone (prior to chronic prednisone, had IFG only).  Discussed XR vs BID of plain metformin. She prefers BID dosing. Just filled rx recently.   Check Urine microalbumin--if + consider restarting ARB--previously took Diovan. Stopped due to cough that persisted after stopping med, unrelated.  Counseled extensively re: reasons for pneumonia and flu shots. She declined both today  Hyperlipidemia and diabetes: Start atorvastatin 10mg  Risks/side effects reviewed. Coenzyme Q10 if side effects. Contact us if not tolerating med.  F/u 3 mos (with fasting labs prior)--lipid, LFT, glu, TSH, Vit D, A1c

## 2015-03-13 LAB — MICROALBUMIN / CREATININE URINE RATIO
CREATININE, URINE: 272.4 mg/dL
MICROALB/CREAT RATIO: 4.4 mg/g (ref 0.0–30.0)
Microalb, Ur: 1.2 mg/dL (ref <2.0)

## 2015-03-17 ENCOUNTER — Other Ambulatory Visit: Payer: Managed Care, Other (non HMO)

## 2015-03-18 ENCOUNTER — Ambulatory Visit
Admission: RE | Admit: 2015-03-18 | Discharge: 2015-03-18 | Disposition: A | Discharge status: Home / Self Care | Payer: Managed Care, Other (non HMO) | Source: Ambulatory Visit | Attending: Family Medicine | Admitting: Family Medicine

## 2015-03-18 DIAGNOSIS — R1031 Right lower quadrant pain: Secondary | ICD-10-CM

## 2015-03-25 ENCOUNTER — Telehealth: Payer: Self-pay | Admitting: Family Medicine

## 2015-03-25 MED ORDER — IBUPROFEN 800 MG PO TABS: 800.0000 mg | ORAL_TABLET | Freq: Three times a day (TID) | ORAL | Status: DC | PRN

## 2015-03-25 NOTE — Telephone Encounter (Signed)
Patient states that when she was in for her last visit, she asked to have Rx ibuprofen 800 mg be sent to [facility]. Patient states Rx was not sent in.  Asked pt if she was just told to take OTC (since it looks like that in chart) but she said she was to receive RX

## 2015-03-25 NOTE — Telephone Encounter (Signed)
My apologies--I was focused on many other things at her visit, and didn't see the request for the refill in the chief complaint (she didn't ask me directly).   Advise patient that I sent her prescription in.

## 2015-03-25 NOTE — Telephone Encounter (Signed)
Called and notified pt

## 2015-03-27 ENCOUNTER — Telehealth: Payer: Self-pay | Admitting: Family Medicine

## 2015-03-27 ENCOUNTER — Other Ambulatory Visit: Payer: Self-pay | Admitting: Medical

## 2015-03-27 MED ORDER — SCOPOLAMINE 1 MG/3DAYS TD PT72: 1.0000 | MEDICATED_PATCH | TRANSDERMAL | Status: DC

## 2015-03-27 NOTE — Telephone Encounter (Signed)
Requesting med for motion sickness. Pt is leaving to go on a cruise in the morning.

## 2015-03-27 NOTE — Telephone Encounter (Signed)
Michelle Mack, please call in scopolamine patch to [facility] patient will be gone for 7 days, and has never been on a cruise before. She needs this asap as she is leaving for cruise at 3:00.

## 2015-03-27 NOTE — Telephone Encounter (Signed)
Called pt back and she states that this is her first cruise and she has never used motion sickness meds in the past. She wanted this to have on hand in case she needed it

## 2015-03-27 NOTE — Telephone Encounter (Signed)
What medication is she requesting, what has she used before?  And in general, better to give [place] more advanced notice than the day before.  Has she used Scopolamine patch prior? Dramamine?

## 2015-06-05 ENCOUNTER — Other Ambulatory Visit: Payer: Self-pay | Admitting: Family Medicine

## 2015-06-05 LAB — HM DIABETES EYE EXAM

## 2015-06-10 ENCOUNTER — Other Ambulatory Visit: Payer: Managed Care, Other (non HMO)

## 2015-06-10 DIAGNOSIS — E559 Vitamin D deficiency, unspecified: Secondary | ICD-10-CM

## 2015-06-10 DIAGNOSIS — E7800 Pure hypercholesterolemia, unspecified: Secondary | ICD-10-CM

## 2015-06-10 DIAGNOSIS — Z5181 Encounter for therapeutic drug level monitoring: Secondary | ICD-10-CM

## 2015-06-10 DIAGNOSIS — E1129 Type 2 diabetes mellitus with other diabetic kidney complication: Secondary | ICD-10-CM

## 2015-06-10 LAB — HEPATIC FUNCTION PANEL
ALBUMIN: 4 g/dL (ref 3.6–5.1)
ALK PHOS: 66 U/L (ref 33–115)
ALT: 18 U/L (ref 6–29)
AST: 12 U/L (ref 10–35)
Bilirubin, Direct: 0.1 mg/dL (ref <=0.2)
Indirect Bilirubin: 0.4 mg/dL (ref 0.2–1.2)
TOTAL PROTEIN: 6.8 g/dL (ref 6.1–8.1)
Total Bilirubin: 0.5 mg/dL (ref 0.2–1.2)

## 2015-06-10 LAB — LIPID PANEL
CHOL/HDL RATIO: 2.6 ratio (ref <=5.0)
CHOLESTEROL: 160 mg/dL (ref 125–200)
HDL: 62 mg/dL (ref >=46)
LDL CALC: 82 mg/dL (ref <130)
TRIGLYCERIDES: 78 mg/dL (ref <150)
VLDL: 16 mg/dL (ref <30)

## 2015-06-10 LAB — GLUCOSE, RANDOM: Glucose, Bld: 96 mg/dL (ref 65–99)

## 2015-06-11 ENCOUNTER — Encounter: Payer: Self-pay | Admitting: Family Medicine

## 2015-06-11 ENCOUNTER — Ambulatory Visit (INDEPENDENT_AMBULATORY_CARE_PROVIDER_SITE_OTHER): Payer: Managed Care, Other (non HMO) | Admitting: Family Medicine

## 2015-06-11 VITALS — BP 132/80 | HR 72 | Ht 63.0 in | Wt 205.0 lb

## 2015-06-11 DIAGNOSIS — E559 Vitamin D deficiency, unspecified: Secondary | ICD-10-CM

## 2015-06-11 DIAGNOSIS — E119 Type 2 diabetes mellitus without complications: Secondary | ICD-10-CM | POA: Diagnosis not present

## 2015-06-11 DIAGNOSIS — E7800 Pure hypercholesterolemia, unspecified: Secondary | ICD-10-CM

## 2015-06-11 DIAGNOSIS — I10 Essential (primary) hypertension: Secondary | ICD-10-CM | POA: Diagnosis not present

## 2015-06-11 LAB — TSH: TSH: 0.838 u[IU]/mL (ref 0.350–4.500)

## 2015-06-11 LAB — VITAMIN D 25 HYDROXY (VIT D DEFICIENCY, FRACTURES): Vit D, 25-Hydroxy: 14 ng/mL — ABNORMAL LOW (ref 30–100)

## 2015-06-11 LAB — HEMOGLOBIN A1C
HEMOGLOBIN A1C: 7.3 % — AB (ref <5.7)
Mean Plasma Glucose: 163 mg/dL — ABNORMAL HIGH (ref <117)

## 2015-06-11 MED ORDER — VITAMIN D (ERGOCALCIFEROL) 1.25 MG (50000 UNIT) PO CAPS: 50000.0000 [IU] | ORAL_CAPSULE | ORAL | Status: DC

## 2015-06-11 MED ORDER — METFORMIN HCL 1000 MG PO TABS: 1000.0000 mg | ORAL_TABLET | Freq: Two times a day (BID) | ORAL | Status: DC

## 2015-06-11 NOTE — Patient Instructions (Addendum)
  You need to fill the Vitamin D prescription and take it weekly for 12 weeks (new prescription was sent--not sure if they will let you have all 12 pills at once, or only fill 4 at a time--if only given 4, remember to get refills for another 2 months).  You may switch your cholesterol medication to mornings, if that helps you remember to take it better.  Hopefully there won't be any kind of side effects--I doubt it, it is a very low dose.  We are increasing your metformin dose to 1000mg  twice daily (use up what you have by taking 2 pills twice a day--the new prescription is the higher dose, and you will only take 1 pill twice daily). Continue to monitor your sugars.  Please bring a list of your blood sugars and blood pressures to your next visit. Let us know if you aren't tolerating the higher dose.  If you develop any loose stools, sometimes taking metamucil along with it helps.  If BP remains elevated, we should restart an ARB (blood pressure medication like Diovan, like you took in the past). Make sure to rest your arm on the table when checking your blood pressure.  Feel free to schedule a nurse visit to have your monitor accuracy assessed. Limit the salt in your diet (sodium 2500mg  daily), and resume regular exercise, as these things will help lower your blood pressure.   Flu shot is strongly recommended.

## 2015-06-11 NOTE — Progress Notes (Signed)
Chief Complaint  Patient presents with  . Diabetes    nonfasting med check, labs doe yesterday.  . Flu Vaccine    declined.   Vitamin D deficiency--she found an old prescription at home that still had a refill on it that hadn't expired.  Sounds like it was just 4 tablets, so she must not have completed a prior course. Not taking OTC supplement.  Diabetes: She saw nutritionist 7/26, and then attended a class.  She is no longer on the same exercise regimen as she was at her last visit (She has been exercising doing a 30 minute circuit Monday through Friday. She also does 30 minutes on the treadmill before doing the 30 minute circuit (abs, steps, weights)). She states she got very frustrated when she didn't see any changes in her weight.  She did report enjoying this exercise.  She hasn't been able to taper down on her prednisone dose (gets recurrent pain in the chest at lower doses).  She will remain on 10mg . She went to the eye doctor on Mionday and was told everything was fine.  Sugars in the mornings are running 130-160; in the evenings (before dinner) is in the 130's. She is taking metformin twice daily.  No side effects.  She has some occasional diarrhea that she attributes to her food choices, not the medication.  Hypertension follow-up: She checks BP at home. She is having trouble remembering the values, thinks 130/84.  Denies headaches, dizziness. In reviewing her chart, she is a diabetic with hypertension who is not on ACEI or ARB. She previously took Diovan HCT, which was stopped due to cough. Cough never improved--unrelated to the ARB. Her urine microalbumin was normal on last check. Blood pressure has been controlled on her current regimen.  Hypercholesterolemia:  She was supposed to start on lipitor after her last visit. She has been trying to take it at night, but admits that she frequently forgets--asking if she can take in the morning, with her other medications, to make it  easier.  She denies any side effects to the medication.  Admits that she probably hasn't taken much of it in the last month.  PMH, [place] SH reviewed.  Outpatient Encounter Prescriptions as of 06/11/2015  Medication Sig Note  . amLODipine (NORVASC) 5 MG tablet TAKE 1/2 TABLET BY MOUTH DAILY   . atorvastatin (LIPITOR) 10 MG tablet Take 1 tablet (10 mg total) by mouth daily. 06/11/2015: Admits to frequently missing her medication, forgetting to take it at night. Hasn't taken it for most of the last month  . hydrochlorothiazide (HYDRODIURIL) 12.5 MG tablet TAKE 1 TABLET (12.5 MG TOTAL) BY MOUTH DAILY.   . metFORMIN (GLUCOPHAGE) 500 MG tablet Take 1 tablet (500 mg total) by mouth 2 (two) times daily with a meal.   . mycophenolate (CELLCEPT) 500 MG tablet Take 500 mg by mouth 2 (two) times daily.  12/04/2014: Received from: [facility]  . predniSONE (DELTASONE) 10 MG tablet Take 10 mg by mouth daily with breakfast.   . Fluticasone Furoate-Vilanterol 200-25 MCG/INH AEPB Inhale 1 puff into the lungs. 06/11/2015: Not using, only prn  . ibuprofen (ADVIL,MOTRIN) 800 MG tablet Take 1 tablet (800 mg total) by mouth every 8 (eight) hours as needed for moderate pain. (Patient not taking: Reported on 06/11/2015)   . ipratropium-albuterol (DUONEB) 0.5-2.5 (3) MG/3ML SOLN Inhale 3 mLs into the lungs every 6 (six) hours as needed (shortness of breath). As needed 08/26/2014: .  Marland Kitchen Vitamin D,  Ergocalciferol, (DRISDOL) 50000 UNITS CAPS capsule  06/11/2015: Never picked up the prescription  . [DISCONTINUED] scopolamine (TRANSDERM-SCOP, 1.5 MG,) 1 MG/3DAYS Place 1 patch (1.5 mg total) onto the skin every 3 (three) days.    No facility-administered encounter medications on file as of 06/11/2015.   Allergies  Allergen Reactions  . Ampicillin Shortness Of Breath  . Penicillins Shortness Of Breath and Swelling  . Cephalosporins Itching  . Nitrofuran Derivatives Itching    Throat, mouth, hands itch  .  Sulfur Other (See Comments)    Feeling of burning on the inside  Persist ant cough   ROS: no fever, chills, headaches, dizziness, chest pain, shortness of breath.  Some sinus drainage/allergies Abdominal pain resolved. No nausea, vomiting. Intermittent diarrhea. No blood in stools. No urinary complaints. No myalgias, joint pains, bleeding, bruising, rash, depression, anxiety or other concerns, except as noted in HPI  PHYSICAL EXAM: BP 140/82 mmHg  Pulse 72  Ht 5\' 3"  (1.6 m)  Wt 205 lb (92.987 kg)  BMI 36.32 kg/m2  LMP 04/28/2015  132/80 on repeat by MD Well developed, pleasant female in no distress HEENT: PERRL, EOMI, conjunctiva clear.  OP is clear. Nasal mucosa is mildly edematous, no erythema or purulence. Sinuses are nontender Neck: no lymphadenopathy or mass Heart: regular rate and rhythm Lungs: clear bilaterally Abdomen: soft, nontender, no mass Extremities no edema, 2+ pulses. Normal diabetic foot exam Psych: normal mood, affect, hygiene and grooming Neuro: alert and oriented. Normal cranial nerves, strength, gait, sensation.  Lab Results  Component Value Date   HGBA1C 7.3* 06/10/2015   (increased from 7.0 three months ago)  Fasting glucose 96  Lab Results  Component Value Date   CHOL 160 06/10/2015   HDL 62 06/10/2015   LDLCALC 82 06/10/2015   TRIG 78 06/10/2015   CHOLHDL 2.6 06/10/2015   (this is with her missing many of the pills over the last month)  Vitamin D-OH 14  ASSESSMENT/PLAN:  Type 2 diabetes mellitus without complication, without long-term current use of insulin (HCC) - A1c increasing; longterm prednisone expected; increase metformin to 1000mg  BID. contact us if SE develop/worsen. restart exercise - Plan: metFORMIN (GLUCOPHAGE) 1000 MG tablet, Hemoglobin A1c, Glucose, random  Essential hypertension, benign - elevated at first, normal on repeat. Continue meds, monitoring; low sodium diet; restart exercise; weight loss encouraged  Vitamin D  deficiency - apparently never completed prior course; lower now - Plan: Vitamin D, Ergocalciferol, (DRISDOL) 50000 UNITS CAPS capsule, VITAMIN D 25 Hydroxy (Vit-D Deficiency, Fractures)  Pure hypercholesterolemia - noncompliant with med--can try switching to take in daytime to help with compliance. recheck labs in 3 mos, when taking med. - Plan: Lipid panel, Hepatic function panel   Refuses flu shot. Strongly encouraged. OTC antihistamines prn for allergies.  BP borderline today, improved some on recheck. Advised how to properly check at home (rest arm on table, heart height) and schedule NV to verify the accuracy. Resume regular exercise which should lower the BP's (and sugars). Last microalbumin/Cr ratio was 4.4 in September.   You need to fill the Vitamin D prescription and take it weekly for 12 weeks (new prescription was sent--not sure if they will let you have all 12 pills at once, or only fill 4 at a time--if only given 4, remember to get refills for another 2 months).  You may switch your cholesterol medication to mornings, if that helps you remember to take it better.  Hopefully there won't be any kind of side effects--I doubt it,  it is a very low dose.  We are increasing your metformin dose to 1000mg  twice daily (use up what you have by taking 2 pills twice a day--the new prescription is the higher dose, and you will only take 1 pill twice daily). Continue to monitor your sugars.  Please bring a list of your blood sugars and blood pressures to your next visit. Let us know if you aren't tolerating the higher dose.  If you develop any loose stools, sometimes taking metamucil along with it helps.  If BP remains elevated, we should restart an ARB (blood pressure medication like Diovan, like you took in the past). Make sure to rest your arm on the table when checking your blood pressure.  Feel free to schedule a nurse visit to have your monitor accuracy assessed. Limit the salt in your  diet (sodium 2500mg  daily), and resume regular exercise, as these things will help lower your blood pressure.   3 months f/u (remind her to start OTC Vitamin D at that time, vs needing add'l rx if level is still low. A1c liver tests and lipids, glu, Vit D to be checked prior to visit.

## 2015-06-13 DIAGNOSIS — L03312 Cellulitis of back [any part except buttock]: Secondary | ICD-10-CM | POA: Insufficient documentation

## 2015-07-02 ENCOUNTER — Encounter: Payer: Self-pay | Admitting: Family Medicine

## 2015-07-14 DIAGNOSIS — Z79899 Other long term (current) drug therapy: Secondary | ICD-10-CM | POA: Insufficient documentation

## 2015-07-16 ENCOUNTER — Ambulatory Visit (INDEPENDENT_AMBULATORY_CARE_PROVIDER_SITE_OTHER): Payer: Managed Care, Other (non HMO) | Admitting: Family Medicine

## 2015-07-16 ENCOUNTER — Encounter: Payer: Self-pay | Admitting: Family Medicine

## 2015-07-16 VITALS — BP 110/80 | HR 82 | Ht 63.0 in | Wt 204.0 lb

## 2015-07-16 DIAGNOSIS — M722 Plantar fascial fibromatosis: Secondary | ICD-10-CM

## 2015-07-16 NOTE — Patient Instructions (Signed)
Plantar Fasciitis Plantar fasciitis is a painful foot condition that affects the heel. It occurs when the band of tissue that connects the toes to the heel bone (plantar fascia) becomes irritated. This can happen after exercising too much or doing other repetitive activities (overuse injury). The pain from plantar fasciitis can range from mild irritation to severe pain that makes it difficult for you to walk or move. The pain is usually worse in the morning or after you have been sitting or lying down for a while. CAUSES This condition may be caused by:  Standing for long periods of time.  Wearing shoes that do not fit.  Doing high-impact activities, including running, aerobics, and ballet.  Being overweight.  Having an abnormal way of walking (gait).  Having tight calf muscles.  Having high arches in your feet.  Starting a new athletic activity. SYMPTOMS The main symptom of this condition is heel pain. Other symptoms include:  Pain that gets worse after activity or exercise.  Pain that is worse in the morning or after resting.  Pain that goes away after you walk for a few minutes. DIAGNOSIS This condition may be diagnosed based on your signs and symptoms. Your health care provider will also do a physical exam to check for:  A tender area on the bottom of your foot.  A high arch in your foot.  Pain when you move your foot.  Difficulty moving your foot. You may also need to have imaging studies to confirm the diagnosis. These can include:  X-rays.  Ultrasound.  MRI. TREATMENT  Treatment for plantar fasciitis depends on the severity of the condition. Your treatment may include:  Rest, ice, and over-the-counter pain medicines to manage your pain.  Exercises to stretch your calves and your plantar fascia.  A splint that holds your foot in a stretched, upward position while you sleep (night splint).  Physical therapy to relieve symptoms and prevent problems in the  future.  Cortisone injections to relieve severe pain.  Extracorporeal shock wave therapy (ESWT) to stimulate damaged plantar fascia with electrical impulses. It is often used as a last resort before surgery.  Surgery, if other treatments have not worked after 12 months. HOME CARE INSTRUCTIONS  Take medicines only as directed by your health care provider.  Avoid activities that cause pain.  Roll the bottom of your foot over a bag of ice or a bottle of cold water. Do this for 20 minutes, 3-4 times a day.  Perform simple stretches as directed by your health care provider.  Try wearing athletic shoes with air-sole or gel-sole cushions or soft shoe inserts.  Wear a night splint while sleeping, if directed by your health care provider.  Keep all follow-up appointments with your health care provider. PREVENTION   Do not perform exercises or activities that cause heel pain.  Consider finding low-impact activities if you continue to have problems.  Lose weight if you need to. The best way to prevent plantar fasciitis is to avoid the activities that aggravate your plantar fascia. SEEK MEDICAL CARE IF:  Your symptoms do not go away after treatment with home care measures.  Your pain gets worse.  Your pain affects your ability to move or do your daily activities.   This information is not intended to replace advice given to you by your health care provider. Make sure you discuss any questions you have with your health care provider.   Document Released: 03/16/2001 Document Revised: 03/12/2015 Document Reviewed: 05/01/2014 Elsevier   Interactive Patient Education 2016 [employer]  

## 2015-07-16 NOTE — Progress Notes (Signed)
   Subjective:    Patient ID: [name], female    DOB: 11-02-1965, 50 y.o.   MRN: [id number]  HPI She is here for evaluation of a one-day history of left heel pain. She has a previous history of difficulty with plantar fascitis dating back to do thousand and 14. Apparently she did see podiatry in the past for this and he did give injection as well as given an orthotic. She has not used the orthotic in roughly one year. She notes he onset of the pain yesterday and is here mainly for a shot.   Review of Systems     Objective:   Physical Exam Full motion of the foot. Slight tenderness palpation over the calcaneal spur. No palpable lesions area       Assessment & Plan:  Plantar fasciitis of left foot Spleen that I did not give shots and she was disappointed. Recommended stretching exercises and demonstrated them to her. Also recommend she start using her orthotic again and take an anti-inflammatory of her choice. If this does not improve then see podiatry. Explained that shots are not necessarily the first therapy that should be done for this problem.

## 2015-08-10 ENCOUNTER — Other Ambulatory Visit: Payer: Self-pay | Admitting: Family Medicine

## 2015-08-27 ENCOUNTER — Other Ambulatory Visit: Payer: Managed Care, Other (non HMO)

## 2015-08-27 DIAGNOSIS — E559 Vitamin D deficiency, unspecified: Secondary | ICD-10-CM

## 2015-08-27 DIAGNOSIS — E119 Type 2 diabetes mellitus without complications: Secondary | ICD-10-CM

## 2015-08-27 DIAGNOSIS — E7800 Pure hypercholesterolemia, unspecified: Secondary | ICD-10-CM

## 2015-08-27 LAB — HEPATIC FUNCTION PANEL
ALBUMIN: 4.2 g/dL (ref 3.6–5.1)
ALT: 16 U/L (ref 6–29)
AST: 14 U/L (ref 10–35)
Alkaline Phosphatase: 66 U/L (ref 33–115)
BILIRUBIN DIRECT: 0.1 mg/dL (ref <=0.2)
Indirect Bilirubin: 0.5 mg/dL (ref 0.2–1.2)
TOTAL PROTEIN: 7.4 g/dL (ref 6.1–8.1)
Total Bilirubin: 0.6 mg/dL (ref 0.2–1.2)

## 2015-08-27 LAB — LIPID PANEL
CHOL/HDL RATIO: 3.2 ratio (ref <=5.0)
Cholesterol: 161 mg/dL (ref 125–200)
HDL: 50 mg/dL (ref >=46)
LDL Cholesterol: 89 mg/dL (ref <130)
Triglycerides: 110 mg/dL (ref <150)
VLDL: 22 mg/dL (ref <30)

## 2015-08-27 LAB — GLUCOSE, RANDOM: GLUCOSE: 97 mg/dL (ref 65–99)

## 2015-08-27 LAB — HEMOGLOBIN A1C
Hgb A1c MFr Bld: 7.3 % — ABNORMAL HIGH (ref <5.7)
MEAN PLASMA GLUCOSE: 163 mg/dL — AB (ref <117)

## 2015-08-28 ENCOUNTER — Encounter: Payer: Self-pay | Admitting: Family Medicine

## 2015-08-28 ENCOUNTER — Ambulatory Visit (INDEPENDENT_AMBULATORY_CARE_PROVIDER_SITE_OTHER): Payer: Managed Care, Other (non HMO) | Admitting: Family Medicine

## 2015-08-28 VITALS — BP 134/72 | HR 80 | Ht 63.0 in | Wt 196.0 lb

## 2015-08-28 DIAGNOSIS — I10 Essential (primary) hypertension: Secondary | ICD-10-CM

## 2015-08-28 DIAGNOSIS — E559 Vitamin D deficiency, unspecified: Secondary | ICD-10-CM

## 2015-08-28 DIAGNOSIS — E1122 Type 2 diabetes mellitus with diabetic chronic kidney disease: Secondary | ICD-10-CM

## 2015-08-28 DIAGNOSIS — N182 Chronic kidney disease, stage 2 (mild): Secondary | ICD-10-CM

## 2015-08-28 LAB — VITAMIN D 25 HYDROXY (VIT D DEFICIENCY, FRACTURES): Vit D, 25-Hydroxy: 35 ng/mL (ref 30–100)

## 2015-08-28 NOTE — Progress Notes (Signed)
Chief Complaint  Patient presents with  . Diabetes    nonfasting med check, labs already done.   Vitamin D deficiency--level was 14 in December.  She is still taking the weekly prescription vitamin D.  She believes she has a bottle of Vitamin D OTC (1000mg ) at home.  Diabetes: Metformin was increased at her December visit, to 1000mg  BID, due to ongoing prednisone use. Denies any side effects.  She reports she is taking 2 pills twice daily.  Last rx sent to pharmacy was for 1000mg  (prev rx's was for 500mg ).  She can't recall the dose, just that she has been taking 4/day. Blood sugars are running 120's in the mornings, and similarly around 6pm prior to eating. Prednisone at one point was decreased to 5mg , with other medication adjustments, but has been back on 10mg  daily.  Hasn't required higher doses of prednisone in the last 3 months.  She is not currently getting regular exercise.  Lately she hasn't been able to get up early in the morning like she used to.   She went to the eye doctor last month.  (she monitors BP and sugars, left sheet in the car; will drop them by later--see scanned sheets).  Hypertension follow-up: She checks BP at home, running 120's..  Denies headaches, dizziness, muscle cramps. No exertional chest pain (some related to her medication, unchanged), no palpitations. In reviewing her chart, she is a diabetic with hypertension who is not on ACEI or ARB. She previously took Diovan HCT, which was stopped due to cough. Cough never improved--unrelated to the ARB. Her urine microalbumin was normal on last check. Blood pressure has been controlled on her current regimen.  She had some edema, and Dr. Posey Pronto (nephro) started her on HCTZ, in addition to the amlodipine.  She wasn't aware of the swelling, so doesn't know if it is any better. Denies any dizziness, muscle cramps, or other side effects.  No headaches.  Hypercholesterolemia: She had been sporadic with taking her lipitor  prior to her last lipids in December.  She was asked to change to taking it in the morning; she just found the bottle, realized she hasn't been taking it at all. Doesn't recall having any side effects.   9# weight loss since last visit in December  PMH, [facility], [facility] reviewed.  Outpatient Encounter Prescriptions as of 08/28/2015  Medication Sig Note  . amLODipine (NORVASC) 5 MG tablet TAKE 1/2 TABLET BY MOUTH DAILY   . hydrochlorothiazide (HYDRODIURIL) 25 MG tablet Take 25 mg by mouth daily. 08/28/2015: Received from: External Pharmacy  . metFORMIN (GLUCOPHAGE) 1000 MG tablet Take 1 tablet (1,000 mg total) by mouth 2 (two) times daily with a meal.   . mycophenolate (CELLCEPT) 500 MG tablet Take 500 mg by mouth 2 (two) times daily.  12/04/2014: Received from: [facility]  . predniSONE (DELTASONE) 10 MG tablet Take 10 mg by mouth daily with breakfast.   . Vitamin D, Ergocalciferol, (DRISDOL) 50000 UNITS CAPS capsule Take 1 capsule (50,000 Units total) by mouth every 7 (seven) days.   . [DISCONTINUED] hydrochlorothiazide (HYDRODIURIL) 12.5 MG tablet TAKE 1 TABLET (12.5 MG TOTAL) BY MOUTH DAILY.   Marland Kitchen atorvastatin (LIPITOR) 10 MG tablet Take 10 mg by mouth daily. 08/28/2015: Just found bottle--realized she hasn't been taking it  . ibuprofen (ADVIL,MOTRIN) 800 MG tablet Take 1 tablet (800 mg total) by mouth every 8 (eight) hours as needed for moderate pain. (Patient not taking: Reported on 06/11/2015)   . [DISCONTINUED]  Fluticasone Furoate-Vilanterol 200-25 MCG/INH AEPB Inhale 1 puff into the lungs. Reported on 07/16/2015 06/11/2015: Not using, only prn  . [DISCONTINUED] predniSONE (DELTASONE) 5 MG tablet  08/28/2015: Received from: External Pharmacy   No facility-administered encounter medications on file as of 08/28/2015.   Allergies  Allergen Reactions  . Ampicillin Shortness Of Breath  . Penicillins Shortness Of Breath and Swelling  . Cephalosporins Itching  . Nitrofuran Derivatives  Itching    Throat, mouth, hands itch  . Sulfamethoxazole-Trimethoprim Other (See Comments)    "body on fire"  . Sulfur Other (See Comments)    Feeling of burning on the inside  Persist ant cough   ROS: no fever, chills, headaches, dizziness, nausea, vomiting, bowel changes.  Some chest pain related to meds, not exertional.  Denies shortness of breath, palpitations. No nausea, vomiting, bowel changes, bleeding, bruising, rash, depression, or other concerns.  See HPI.  PHYSICAL EXAM: BP 134/72 mmHg  Pulse 80  Ht 5\' 3"  (1.6 m)  Wt 196 lb (88.905 kg)  BMI 34.73 kg/m2  LMP 07/30/2015  Well developed, pleasant female in no distress HEENT: PERRL, EOMI, conjunctiva clear. OP is clear. Neck: no lymphadenopathy or mass Heart: regular rate and rhythm Lungs: clear bilaterally Abdomen: soft, nontender, no mass Extremities no edema, 2+ pulses. Psych: normal mood, affect, hygiene and grooming Neuro: alert and oriented. Normal cranial nerves, strength, gait, sensation.  Lab Results  Component Value Date   HGBA1C 7.3* 08/27/2015  (unchanged from December)  Lab Results  Component Value Date   CHOL 161 08/27/2015   CHOL 160 06/10/2015   CHOL 169 03/06/2015   Lab Results  Component Value Date   HDL 50 08/27/2015   HDL 62 06/10/2015   HDL 44* 03/06/2015   Lab Results  Component Value Date   LDLCALC 89 08/27/2015   LDLCALC 82 06/10/2015   LDLCALC 101 03/06/2015   Lab Results  Component Value Date   TRIG 110 08/27/2015   TRIG 78 06/10/2015   TRIG 120 03/06/2015   Lab Results  Component Value Date   CHOLHDL 3.2 08/27/2015   CHOLHDL 2.6 06/10/2015   CHOLHDL 3.8 03/06/2015   No results found for: LDLDIRECT  Lab Results  Component Value Date   ALT 16 08/27/2015   AST 14 08/27/2015   ALKPHOS 66 08/27/2015   BILITOT 0.6 08/27/2015   Fasting glucose 97  Vitamin D-OH 35 (up from level of 14 in December)  ASSESSMENT/PLAN:   Discussed additional diabetes medications to  get goal to <7 A1c. Discussed daily exercise and further weight loss, as well as checking sugars postprandially and returning in 3 months, rather than starting new medication.  Briefly discussed oral meds such as Farxiga/Invokana/Jardiance (if kidney function is adequate)  Also discussed injectables such as Trulicity/Bydureon (weekly), Victoza (daily).  She declines starting meds, prefers to return in 3 months.   Please start taking the atorvastatin.  If it is easier for you to remember to take it with your other medications, you can take them all at the same, in the morning.   Check [facility] on [address] for records, recent visit.   F/u 3 months, A1c at visit. Lipids (if fasting 6 hrs, if actually taking atorvastatin)

## 2015-08-28 NOTE — Patient Instructions (Addendum)
We discussed that your A1c remains above goal (goal is <7, it was 7.3). Options include starting new medications, in addition to the metformin. We briefly discussed oral meds such as Farxiga/Invokana/Jardiance (if kidney function is adequate) Also discussed injectables such as Trulicity/Bydureon (weekly), Victoza (daily).  All of these medications could also help with weight loss. There are plenty of other diabetes medication we could use instead, but these have the weight loss benefit.  We discussed trial of continuing the same medication (metformin alone), but adding daily exercise, and continued weight loss. You preferred this option, rather than a new medication, so we will have you return in 3 months.  If the A1c remains over 7, then we have no choice but to make medication adjustments.  When you finish the prescription Vitamin D, please start taking the over-the-counter Vitamin D3 1000 IU every day.  You will need to take this supplement long-term (forever).  Please have your eye doctor send [place] a copy of your eye exam.  Please drop off a list of your blood pressures and sugars for my review.  Please send [place] a message or call TODAY to let us know what your current bottle of metformin says--you stated you were taking 2 pills twice daily.  I need to verify if these are 500mg  vs 1000mg .  The last prescription I sent to the pharmacy (December) was supposed to be 1000mg  metformin, to be taken 1 pill twice daily.  (vs taking two 500mg  twice daily).  I want to make sure that you aren't taking 2000mg  twice daily, because that dose is excessive and dangerous.  Also, let us know which medications (ie metformin, atorvastatin) you need refills on.  START the atorvastatin 10mg  that you have at home. Take it with your other medications. Let us know if you have side effects.

## 2015-08-29 ENCOUNTER — Telehealth: Payer: Self-pay | Admitting: Family Medicine

## 2015-08-29 DIAGNOSIS — E119 Type 2 diabetes mellitus without complications: Secondary | ICD-10-CM

## 2015-08-29 NOTE — Telephone Encounter (Signed)
Pt called and stated she was to call back with dose of Metformin. Pt takes 500 MG.

## 2015-08-29 NOTE — Telephone Encounter (Signed)
Advise pt--I thought at her December visit I changed her to 1000mg  tablets, to be taken twice daily (rx's were sent). It doesn't sound like she ever took these.  I was just trying to reduce the number of pills.  I usually only stay with the 500mg  dose when it is the XR since the 1000mg  XR is usually a brand and more expensive. Please see if she needs more, if not contact us when she needs, and I recommend changing to 1000mg  BID simply to reduce the number of pills.  She can continue taking 2 of the 500mg  twice daily with her current supply.

## 2015-09-01 ENCOUNTER — Encounter: Payer: Self-pay | Admitting: *Deleted

## 2015-09-01 MED ORDER — METFORMIN HCL 1000 MG PO TABS: 1000.0000 mg | ORAL_TABLET | Freq: Two times a day (BID) | ORAL | Status: DC

## 2015-09-01 NOTE — Telephone Encounter (Signed)
Patient will continue on the 2 of the 500mg  BID and I will call her 1000mg  BID to her pharmacy and ask them to put hold and she will call them when she needs.

## 2015-09-10 ENCOUNTER — Other Ambulatory Visit: Payer: Self-pay | Admitting: Family Medicine

## 2015-09-10 ENCOUNTER — Other Ambulatory Visit: Payer: Managed Care, Other (non HMO)

## 2015-09-11 ENCOUNTER — Encounter: Payer: Managed Care, Other (non HMO) | Admitting: Family Medicine

## 2015-09-22 ENCOUNTER — Inpatient Hospital Stay: Payer: Managed Care, Other (non HMO) | Admitting: Family Medicine

## 2015-09-22 ENCOUNTER — Encounter: Payer: Self-pay | Admitting: Family Medicine

## 2015-09-22 ENCOUNTER — Ambulatory Visit (INDEPENDENT_AMBULATORY_CARE_PROVIDER_SITE_OTHER): Payer: Managed Care, Other (non HMO) | Admitting: Family Medicine

## 2015-09-22 VITALS — BP 118/72 | HR 84 | Temp 98.7°F | Ht 63.0 in | Wt 194.0 lb

## 2015-09-22 DIAGNOSIS — I10 Essential (primary) hypertension: Secondary | ICD-10-CM | POA: Diagnosis not present

## 2015-09-22 DIAGNOSIS — J189 Pneumonia, unspecified organism: Secondary | ICD-10-CM | POA: Diagnosis not present

## 2015-09-22 DIAGNOSIS — E119 Type 2 diabetes mellitus without complications: Secondary | ICD-10-CM

## 2015-09-22 NOTE — Progress Notes (Signed)
Chief Complaint  Patient presents with  . ER follow up    follow up from ER visit at [facility] 09/15/15. Also was seen at ER in [place] on 09/12/15-lungs collapsed, brought CT scan with her from that visit.    She went to ER in [place] with shortness of breath.  She was told she had a collapsed lung, that pulmonary doctor told her to have bronchoscopy in office that Monday.  Instead, she returned home (had to take bus x 2d, told not to fly). She called her pulmonary doctor, who sent her to the ER. She went to ER at [facility], where she was diagnosed with CA-pneumonia. She was treated with a z-pak and prednisone was increased to 50mg  daily x 5 days.  She hasn't resumed the daily prednisone dose of 5mg  yet.  She completed antibiotics and steroid course on Saturday.  During the day she is coughing, not getting up any phlegm--this is chronic, not really different than normal.  At night she continues to feel short of breath--not as bad as prior to the medications. No longer having shortness of breath going up the stairs, as she did prior to ER visit. She complains of always feeling tired.  She scheduled an appt today for Wednesday with a pulmonary doctor within the practice of her regular pulmonologist.  Diabetes: Sugars went up to 190's while on the higher dose of prednisone.  Sugars have since improved--this morning glucose was 94.   PMH, PSH, SH reviewed  Outpatient Encounter Prescriptions as of 09/22/2015  Medication Sig Note  . albuterol (PROVENTIL) (2.5 MG/3ML) 0.083% nebulizer solution  09/22/2015: Using every 4 hours  . amLODipine (NORVASC) 5 MG tablet TAKE 1/2 TABLET BY MOUTH DAILY   . atorvastatin (LIPITOR) 10 MG tablet Take 10 mg by mouth daily. Reported on 09/22/2015 09/22/2015: Resumed last week  . hydrochlorothiazide (HYDRODIURIL) 25 MG tablet Take 25 mg by mouth daily. 08/28/2015: Received from: External Pharmacy  . metFORMIN (GLUCOPHAGE) 1000 MG tablet Take 1 tablet (1,000 mg total) by mouth 2  (two) times daily with a meal.   . Vitamin D, Ergocalciferol, (DRISDOL) 50000 UNITS CAPS capsule Take 1 capsule (50,000 Units total) by mouth every 7 (seven) days.   . [DISCONTINUED] hydrochlorothiazide (HYDRODIURIL) 12.5 MG tablet TAKE 1 TABLET (12.5 MG TOTAL) BY MOUTH DAILY.   Marland Kitchen ibuprofen (ADVIL,MOTRIN) 800 MG tablet Take 1 tablet (800 mg total) by mouth every 8 (eight) hours as needed for moderate pain. (Patient not taking: Reported on 06/11/2015)   . mycophenolate (CELLCEPT) 500 MG tablet Take 500 mg by mouth 2 (two) times daily. Reported on 09/22/2015 12/04/2014: Received from: [facility]  . predniSONE (DELTASONE) 10 MG tablet Take 10 mg by mouth daily with breakfast. Reported on 09/22/2015 09/22/2015: Usually takes 5mg  daily (has 5mg  tablet); not taking anything since completing 5d of 50mg  daily   No facility-administered encounter medications on file as of 09/22/2015.   Allergies  Allergen Reactions  . Ampicillin Shortness Of Breath  . Penicillins Shortness Of Breath and Swelling  . Cephalosporins Itching  . Nitrofuran Derivatives Itching    Throat, mouth, hands itch  . Sulfamethoxazole-Trimethoprim Other (See Comments)    "body on fire"  . Sulfur Other (See Comments)    Feeling of burning on the inside  Persist ant cough   ROS: Denies fever, chills, nausea, vomiting.  She had some diarrhea with the antibiotic, resolved. +fatigue. See HPI  PHYSICAL EXAM: BP 118/72 mmHg  Pulse  84  Temp(Src) 98.7 F (37.1 C) (Tympanic)  Ht 5\' 3"  (1.6 m)  Wt 194 lb (87.998 kg)  BMI 34.37 kg/m2  LMP 07/30/2015 Well appearing, pleasant female in no distress.  Speaking easily in full sentences, only rare (deep sounding) cough during visit HEENT: PERRL, EOMI, conjunctiva and sclera are clear. Nasal mucosa is moderately edematous, no erythema, clear mucus. Sinuses are nontender. Op is clear, moist mucus membranes, no erythema or lesions Neck: no lymphadenopathy or mass Heart:  regular rate and rhythm Lungs: clear bilaterally. No wheezes, rales, ronchi  CXR report reviewed--streaky L basilar opacities  ASSESSMENT/PLAN:  CAP (community acquired pneumonia) - clinically improved, with clear lungs, improved symptoms, but persistent/chronic cough, and slight dyspnea. f/u with pulm in 2d as scheduled. no CXR needed toda  Essential hypertension, benign - well controlled  Type 2 diabetes mellitus without complication, without long-term current use of insulin (HCC) - brief elevation in sugar due to higher steroids; improved now. continue current meds   You have evidence of some allergies on your nasal exam, which might explain your ear popping.  Feel free to use claritin or zyrtec as needed to treat these symptoms.  Your lungs were clear--no reason to get another chest x-ray today, but you will need one to follow up on the pneumonia at some point--I will leave that up to your pulmonologist to decide, since you have an appointment with them on Wednesday.  Resume your regular prednisone dose of 5mg  daily.  The fatigue that comes with having pneumonia can take a few weeks to improve--please be patient.

## 2015-09-22 NOTE — Patient Instructions (Signed)
  You have evidence of some allergies on your nasal exam, which might explain your ear popping.  Feel free to use claritin or zyrtec as needed to treat these symptoms.  Your lungs were clear--no reason to get another chest x-ray today, but you will need one to follow up on the pneumonia at some point--I will leave that up to your pulmonologist to decide, since you have an appointment with them on Wednesday.  Resume your regular prednisone dose of 5mg  daily.  The fatigue that comes with having pneumonia can take a few weeks to improve--please be patient.

## 2015-09-26 ENCOUNTER — Emergency Department (HOSPITAL_COMMUNITY)
Admission: EM | Admit: 2015-09-26 | Discharge: 2015-09-27 | Disposition: A | Discharge status: Home / Self Care | Payer: Managed Care, Other (non HMO) | Attending: Emergency Medicine | Admitting: Emergency Medicine

## 2015-09-26 ENCOUNTER — Other Ambulatory Visit: Payer: Self-pay

## 2015-09-26 ENCOUNTER — Encounter (HOSPITAL_COMMUNITY): Payer: Self-pay | Admitting: Emergency Medicine

## 2015-09-26 DIAGNOSIS — R197 Diarrhea, unspecified: Secondary | ICD-10-CM | POA: Insufficient documentation

## 2015-09-26 DIAGNOSIS — Z8742 Personal history of other diseases of the female genital tract: Secondary | ICD-10-CM | POA: Diagnosis not present

## 2015-09-26 DIAGNOSIS — Z8719 Personal history of other diseases of the digestive system: Secondary | ICD-10-CM | POA: Diagnosis not present

## 2015-09-26 DIAGNOSIS — Z86018 Personal history of other benign neoplasm: Secondary | ICD-10-CM | POA: Diagnosis not present

## 2015-09-26 DIAGNOSIS — Z7952 Long term (current) use of systemic steroids: Secondary | ICD-10-CM | POA: Diagnosis not present

## 2015-09-26 DIAGNOSIS — Z1231 Encounter for screening mammogram for malignant neoplasm of breast: Secondary | ICD-10-CM

## 2015-09-26 DIAGNOSIS — Z8709 Personal history of other diseases of the respiratory system: Secondary | ICD-10-CM | POA: Diagnosis not present

## 2015-09-26 DIAGNOSIS — I10 Essential (primary) hypertension: Secondary | ICD-10-CM | POA: Diagnosis not present

## 2015-09-26 DIAGNOSIS — D649 Anemia, unspecified: Secondary | ICD-10-CM | POA: Insufficient documentation

## 2015-09-26 DIAGNOSIS — Z8739 Personal history of other diseases of the musculoskeletal system and connective tissue: Secondary | ICD-10-CM | POA: Insufficient documentation

## 2015-09-26 DIAGNOSIS — E119 Type 2 diabetes mellitus without complications: Secondary | ICD-10-CM | POA: Insufficient documentation

## 2015-09-26 DIAGNOSIS — R350 Frequency of micturition: Secondary | ICD-10-CM | POA: Insufficient documentation

## 2015-09-26 DIAGNOSIS — Z880 Allergy status to penicillin: Secondary | ICD-10-CM | POA: Diagnosis not present

## 2015-09-26 DIAGNOSIS — R1031 Right lower quadrant pain: Secondary | ICD-10-CM | POA: Diagnosis not present

## 2015-09-26 DIAGNOSIS — R1030 Lower abdominal pain, unspecified: Secondary | ICD-10-CM

## 2015-09-26 DIAGNOSIS — Z7984 Long term (current) use of oral hypoglycemic drugs: Secondary | ICD-10-CM | POA: Diagnosis not present

## 2015-09-26 DIAGNOSIS — Z79899 Other long term (current) drug therapy: Secondary | ICD-10-CM | POA: Insufficient documentation

## 2015-09-26 NOTE — ED Notes (Signed)
Patient presents for RLQ abdominal pain radiating to LLQ x2 days, watery and soft formed stools, and urinary frequency. Denies N/V, fever or chills.

## 2015-09-27 ENCOUNTER — Emergency Department (HOSPITAL_COMMUNITY): Payer: Managed Care, Other (non HMO)

## 2015-09-27 LAB — COMPREHENSIVE METABOLIC PANEL
ALK PHOS: 66 U/L (ref 38–126)
ALT: 22 U/L (ref 14–54)
ANION GAP: 10 (ref 5–15)
AST: 16 U/L (ref 15–41)
Albumin: 3.8 g/dL (ref 3.5–5.0)
BUN: 15 mg/dL (ref 6–20)
CALCIUM: 9.6 mg/dL (ref 8.9–10.3)
CO2: 32 mmol/L (ref 22–32)
CREATININE: 1.06 mg/dL — AB (ref 0.44–1.00)
Chloride: 100 mmol/L — ABNORMAL LOW (ref 101–111)
Glucose, Bld: 117 mg/dL — ABNORMAL HIGH (ref 65–99)
Potassium: 3.3 mmol/L — ABNORMAL LOW (ref 3.5–5.1)
SODIUM: 142 mmol/L (ref 135–145)
TOTAL PROTEIN: 7.1 g/dL (ref 6.5–8.1)
Total Bilirubin: 0.7 mg/dL (ref 0.3–1.2)

## 2015-09-27 LAB — URINALYSIS, ROUTINE W REFLEX MICROSCOPIC
Bilirubin Urine: NEGATIVE
Glucose, UA: NEGATIVE mg/dL
Ketones, ur: NEGATIVE mg/dL
LEUKOCYTES UA: NEGATIVE
NITRITE: NEGATIVE
PROTEIN: NEGATIVE mg/dL
SPECIFIC GRAVITY, URINE: 1.022 (ref 1.005–1.030)
pH: 5.5 (ref 5.0–8.0)

## 2015-09-27 LAB — CBC
HCT: 37 % (ref 36.0–46.0)
HEMOGLOBIN: 11.9 g/dL — AB (ref 12.0–15.0)
MCH: 27.8 pg (ref 26.0–34.0)
MCHC: 32.2 g/dL (ref 30.0–36.0)
MCV: 86.4 fL (ref 78.0–100.0)
PLATELETS: 407 10*3/uL — AB (ref 150–400)
RBC: 4.28 MIL/uL (ref 3.87–5.11)
RDW: 13.6 % (ref 11.5–15.5)
WBC: 12.3 10*3/uL — AB (ref 4.0–10.5)

## 2015-09-27 LAB — LIPASE, BLOOD: Lipase: 47 U/L (ref 11–51)

## 2015-09-27 LAB — URINE MICROSCOPIC-ADD ON: WBC UA: NONE SEEN WBC/hpf (ref 0–5)

## 2015-09-27 MED ORDER — IOPAMIDOL (ISOVUE-300) INJECTION 61%
100.0000 mL | Freq: Once | INTRAVENOUS | Status: AC | PRN
  Administered 2015-09-27: 100 mL via INTRAVENOUS

## 2015-09-27 MED ORDER — SODIUM CHLORIDE 0.9 % IV BOLUS (SEPSIS)
1000.0000 mL | Freq: Once | INTRAVENOUS | Status: AC
  Administered 2015-09-27: 1000 mL via INTRAVENOUS

## 2015-09-27 MED ORDER — POLYETHYLENE GLYCOL 3350 17 G PO PACK: 17.0000 g | PACK | Freq: Two times a day (BID) | ORAL | Status: DC | PRN

## 2015-09-27 MED ORDER — IOHEXOL 300 MG/ML  SOLN
50.0000 mL | Freq: Once | INTRAMUSCULAR | Status: AC | PRN
  Administered 2015-09-27: 50 mL via ORAL

## 2015-09-27 NOTE — ED Notes (Signed)
Pt given PO fluids to take sips per MD verbal order.

## 2015-09-27 NOTE — ED Notes (Signed)
Pt to CT via stretcher

## 2015-09-27 NOTE — ED Provider Notes (Signed)
CSN: [id number]     Arrival date & time 09/26/15  2217 History  By signing my name below, I, [name], attest that this documentation has been prepared under the direction and in the presence of [name], MD. Electronically Signed: Virgel Bouquet, ED Scribe. 09/27/2015. 3:20 AM.   Chief Complaint  Patient presents with  . Abdominal Pain   The history is provided by the patient. No language interpreter was used.   HPI Comments: Michelle Mack is a 50 y.o. female who presents to the Emergency Department complaining of constant, cramping RLQ abdominal pain onset 2 days ago. Patient reports. She endorses associated diarrhea and increased urine. She has not taken any pain medication or attempted any treatments. Pain has not alleviating or exacerbating factors. Per patient, she was diagnosed with pneumonia recently and finished a course of antibiotics and is currently taking prednisone. Denies hx of abdominal surgeries. Denies recent sick contact. Denies nausea, fevers, chills, dysuria, hematuria. Denies pregnancy.  Past Medical History  Diagnosis Date  . GERD (gastroesophageal reflux disease)   . Hypertension   . Fibroid uterus     GYN--Dr. Rivard  . Recurrent upper respiratory infection (URI)     in October - tx with abx  . Anemia     on iron  . Back pain     tx with ibuprofen 800mg   . Cough     non-productive  . Bronchitis     treated recently by abx  . Infertility, female   . DUB (dysfunctional uterine bleeding)   . Type II or unspecified type diabetes mellitus without mention of complication, not stated as uncontrolled     diagnosed by Dr. Berdine Addison  . Polychondritis    Past Surgical History  Procedure Laterality Date  . Myomectomy  2002  . Endometrial vaporization w/ versapoint    . Bronchoscopy  01/2014    at [facility] expiratory collapse of trachea and focal narrowing in L mainstem bronchus and LUL   Family History  Problem Relation Age of Onset  .  Hypertension Mother   . Hypertension Father   . Hypertension Sister   . Thyroid nodules Sister   . Diabetes Maternal Grandmother   . Breast cancer Maternal Grandmother   . Heart disease Maternal Grandmother   . Heart disease Paternal Grandmother    Social History  Substance Use Topics  . 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 Smokeless tobacco: Never Used  . Alcohol Use: Yes     Comment: socially 1-2 drinks, once a  month (mixed drinks)   OB History    Gravida Para Term Preterm AB TAB SAB Ectopic Multiple Living   5 0 0  4 2 2    0     Review of Systems  Constitutional: Negative for fever and chills.  Gastrointestinal: Positive for abdominal pain and diarrhea. Negative for nausea.  Genitourinary: Negative for dysuria and hematuria.  All other systems reviewed and are negative.     Allergies  Ampicillin; Penicillins; Cephalosporins; Nitrofuran derivatives; Sulfamethoxazole-trimethoprim; and Sulfur  Home Medications   Prior to Admission medications   Medication Sig Start Date End Date Taking? Authorizing Provider  albuterol (PROVENTIL) (2.5 MG/3ML) 0.083% nebulizer solution Take 2.5 mg by nebulization every 4 (four) hours as needed for wheezing or shortness of breath.  09/10/15  Yes Historical Provider, MD  amLODipine (NORVASC) 5 MG tablet TAKE 1/2 TABLET BY MOUTH DAILY Patient taking differently: TAKE 2.5 MG BY MOUTH DAILY 08/11/15  Yes [name], MD  atorvastatin (LIPITOR) 10 MG tablet Take 10 mg by mouth daily. Reported on 09/22/2015   Yes Historical Provider, MD  hydrochlorothiazide (HYDRODIURIL) 25 MG tablet Take 25 mg by mouth daily. 08/01/15  Yes Historical Provider, MD  metFORMIN (GLUCOPHAGE) 1000 MG tablet Take 1 tablet (1,000 mg total) by mouth 2 (two) times daily with a meal. 09/01/15  Yes [name], MD  mycophenolate (CELLCEPT) 500 MG tablet Take 500 mg by mouth 2 (two) times daily. Reported on 09/22/2015 12/03/14  Yes Historical Provider, MD  predniSONE (DELTASONE) 20 MG tablet  Take 20 mg by mouth daily with breakfast.   Yes Historical Provider, MD  Vitamin D, Ergocalciferol, (DRISDOL) 50000 UNITS CAPS capsule Take 1 capsule (50,000 Units total) by mouth every 7 (seven) days. 06/11/15  Yes [name], MD  ibuprofen (ADVIL,MOTRIN) 800 MG tablet Take 1 tablet (800 mg total) by mouth every 8 (eight) hours as needed for moderate pain. Patient not taking: Reported on 06/11/2015 03/25/15   [name], MD   BP 124/86 mmHg  Pulse 80  Temp(Src) 98 F (36.7 C) (Oral)  Resp 16  SpO2 100%  LMP 07/30/2015 Physical Exam  Constitutional: She is oriented to person, place, and time. She appears well-developed and well-nourished. No distress.  HENT:  Head: Normocephalic and atraumatic.  Eyes: Conjunctivae and EOM are normal.  Neck: Neck supple. No tracheal deviation present.  Cardiovascular: Normal rate.   Pulmonary/Chest: Effort normal. No respiratory distress.  Abdominal: There is tenderness. There is no rebound.  RLQ tenderness without rebound.  Musculoskeletal: Normal range of motion.  Neurological: She is alert and oriented to person, place, and time.  Skin: Skin is warm and dry.  Psychiatric: She has a normal mood and affect. Her behavior is normal.  Nursing note and vitals reviewed.   ED Course  Procedures   DIAGNOSTIC STUDIES: Oxygen Saturation is 100% on RA, normal by my interpretation.    COORDINATION OF CARE: 2:25 AM Will order abdominal CT. Discussed risks and benefits of ordering [place] of abdomen instead of an abdominal CT with pt and pt and myself agree to proceed with abdominal CT. Discussed ordering pain medication and pt declined. Discussed treatment plan with pt at bedside and pt agreed to plan.   Labs Review Labs Reviewed  COMPREHENSIVE METABOLIC PANEL - Abnormal; Notable for the following:    Potassium 3.3 (*)    Chloride 100 (*)    Glucose, Bld 117 (*)    Creatinine, Ser 1.06 (*)    All other components within normal limits  CBC - Abnormal; Notable  for the following:    WBC 12.3 (*)    Hemoglobin 11.9 (*)    Platelets 407 (*)    All other components within normal limits  URINALYSIS, ROUTINE W REFLEX MICROSCOPIC (NOT AT [facility]) - Abnormal; Notable for the following:    Hgb urine dipstick MODERATE (*)    All other components within normal limits  URINE MICROSCOPIC-ADD ON - Abnormal; Notable for the following:    Squamous Epithelial / LPF 0-5 (*)    Bacteria, UA RARE (*)    All other components within normal limits  LIPASE, BLOOD    Imaging Review No results found. I have personally reviewed and evaluated these images and lab results as part of my medical decision-making.   EKG Interpretation None      MDM   Final diagnoses:  Lower abdominal pain    49yF with lower abdominal pain, worse in RLQ. CT with fibroid uterus  and moderate stool burden. Labs and imaging reviewed with patient. PRN miralax. It has been determined that no acute conditions requiring further emergency intervention are present at this time. The patient has been advised of the diagnosis and plan. I reviewed any labs and imaging including any potential incidental findings. We have discussed signs and symptoms that warrant return to the ED and they are listed in the discharge instructions.    I personally preformed the services scribed in my presence. The recorded information has been reviewed is accurate. [name], MD.    [name], MD 09/30/15 1020

## 2015-10-04 ENCOUNTER — Other Ambulatory Visit: Payer: Self-pay | Admitting: Family Medicine

## 2015-10-07 ENCOUNTER — Telehealth: Payer: Self-pay | Admitting: Family Medicine

## 2015-10-07 NOTE — Telephone Encounter (Signed)
Patient called back again. Stating that we sent rx in yesterday but that her insurance will not pay for it because it still has directions of 1/2 tablet and not 1 tablet   apparently she [place] another state when she was out of town and is early to refill her RX   advised her that for that she may need to contact her insurance company to explain why she is wanting refill early  She then states that she still needs dose changed to 1 pill daily, when I asked her why she stated that is what she was on before and that she does not want to cut the pills in half

## 2015-10-07 NOTE — Telephone Encounter (Signed)
Patient does not want to change dose, she thought if you changed the dose that her insurance would pay for it early. Explained to her why we can't just change the dose.  Advised her to call pharmacy and ask them about filling partial Rx to cover her until insurance will cover on 10/19/15

## 2015-10-07 NOTE — Telephone Encounter (Signed)
I don't think [place] saw the note from the pharmacist when the refill came through yesterday (or she would have sent it to me to address).  I reviewed the last note from her nephrologist, and she was only on 2.5mg  (1/2 tablet) at that time, and he increased her HCTZ.  I have no mention of her dose being increased, and raising the dose will lower blood pressure and possible contribute to swelling, so I don't want to increase her med just for the sake of not cutting pills.  As far as I know, her physicians have not recommended this change.  They make a 2.5mg  tablet, and we can change the dose to the lower dose so she doesn't have to cut it if that's what she would prefer.  If the dose was changed by her nephrologist, then he should be writing the prescriptions for her.    Melissa--I'm sending this back to you, in case there was more to your conversation that might clarify this--feel free to send to the pool if needed today, or to [place] if can wait until tomorrow.  thanks

## 2015-10-07 NOTE — Telephone Encounter (Signed)
Patient called about amlodopine rx, states rx directions are 1/2 tablet but she is taking 1 tablet. Needs new rx with these directions  Please advise patient if/when rx sent in

## 2015-10-14 ENCOUNTER — Ambulatory Visit
Admission: RE | Admit: 2015-10-14 | Discharge: 2015-10-14 | Disposition: A | Discharge status: Home / Self Care | Payer: Managed Care, Other (non HMO) | Source: Ambulatory Visit

## 2015-10-14 DIAGNOSIS — Z1231 Encounter for screening mammogram for malignant neoplasm of breast: Secondary | ICD-10-CM

## 2015-10-21 ENCOUNTER — Telehealth: Payer: Self-pay | Admitting: Family Medicine

## 2015-10-21 DIAGNOSIS — E119 Type 2 diabetes mellitus without complications: Secondary | ICD-10-CM

## 2015-10-21 NOTE — Telephone Encounter (Signed)
[facility] req Metformin HCL 1000 mg tablet #90 day supply

## 2015-10-22 MED ORDER — METFORMIN HCL 1000 MG PO TABS: 1000.0000 mg | ORAL_TABLET | Freq: Two times a day (BID) | ORAL | Status: DC

## 2015-10-23 ENCOUNTER — Ambulatory Visit (INDEPENDENT_AMBULATORY_CARE_PROVIDER_SITE_OTHER): Payer: Managed Care, Other (non HMO) | Admitting: Internal Medicine

## 2015-10-23 ENCOUNTER — Encounter: Payer: Self-pay | Admitting: Internal Medicine

## 2015-10-23 VITALS — BP 114/72 | HR 92 | Ht 63.0 in | Wt 196.0 lb

## 2015-10-23 DIAGNOSIS — R599 Enlarged lymph nodes, unspecified: Secondary | ICD-10-CM

## 2015-10-23 DIAGNOSIS — J9811 Atelectasis: Secondary | ICD-10-CM | POA: Diagnosis not present

## 2015-10-23 DIAGNOSIS — R05 Cough: Secondary | ICD-10-CM | POA: Diagnosis not present

## 2015-10-23 DIAGNOSIS — M941 Relapsing polychondritis: Secondary | ICD-10-CM | POA: Insufficient documentation

## 2015-10-23 DIAGNOSIS — R590 Localized enlarged lymph nodes: Secondary | ICD-10-CM | POA: Insufficient documentation

## 2015-10-23 DIAGNOSIS — R053 Chronic cough: Secondary | ICD-10-CM

## 2015-10-23 NOTE — Progress Notes (Signed)
IOV 06/11[birth year]2011  50 year female. Cough for 9 months. PMD is Dr [name] (formerly PCP Dr Criss Rosales). Referred by Dr Verdia Kuba. 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 Non-smoker.   Insidious onset of cough for 9 months. INnitially PMD Dr Criss Rosales considered GERD etiology. Starrted on Rx for GERD. Then referred to Dr Collene Mares who she has been seeing for several months. PAtient states s/p BRAVO study last week (done off dexilant for 3 days) and reportedly normal. Dexilant was not helpful. Coughs all the time, intermittently. Rates severity as mild. Considers quality: barking, laryngeal cough. Made worse after dinner or feeling hot - at this time she feels it is bronchitis. Smoke, dust, cold air have no impact on cough. Made better by unclear. Dry cough. No nocturnal cough. CT chest June 2012 - normal (personally reviewed). Does not use humidifier. No mold in the house. Denies sinus drainage. Denies currently tickle in throat. On ARB for many years. Has Dulera past week but not tried it. Takes once month advil for ovarian cycles prn. Does admit to wheeze x 3 days. No dyspnea.   RSI score is 11 - level 5 cough after lying down, level 3 sensation of lump in throat, level 2 - choking, level 1 - hoarse voice.    No family hx of asthma. Spirometry today is normal. 2.08L/85%  REC Unclear why you are coughing  Please do spirometry here today  Please have methacholine challenge test and then come back to follow with me    OV 03/13/2013 Followup chronic cough. I have not seen her since December 2012; coming up 22 months since last visit.  At that time spirometry was normal and I recommended a methacholine challenge test. However, she did not follow through with that. She says in the past almost 2 years cough persists unchanged. It is dry. It is present all day. Rates it as mild to moderate and severe. She is not sure exactly why she has not followed up but she says that she did have autoimmune evaluation for  chronic cough and saw Dr. Gavin Pound in rheumatology and her lupus titers were elevated but SLE has been ruled out. She says she also saw the cardiologist Dr. Tollie Eth for chronic cough and recollect having had a cardiac stress test that was normal.   Cardiologist ordered  CT chest 01/16/2013 that showed normal lung parenchyma ; I personally review this film. No Coronary artery calcification either (echo not done per patient)  RSI cough score today is 27 and reflects irritable larynx syndrome. Cough differentiator score shows reflux as a cause for cough but she says this is been ruled out but gastroenterology   REC Methacholine challenge test  OV 04/05/2013 Followup chronic cough after methacholine challenge test  On 03/27/13: She went for methacholine challenge test but this was not even started because baseline spirometry showed : Spirometry Fev1 1.38L/56%, FVC 3L/100%, RAtio 46. Obstruction moderate. On fV loop lot of cough. ? VCD. Therefore on 03/28/2013 she started a 12 day prednisone taper along with Dulera. She is now midway through her prednisone and she says she is compliant with her Ruthe Mannan but her cough has not improved at all and it persists unchanged. Discovered she is taking dulera prn basis and she is rinsing her mdi with water after use. However, mdi technique is good thou In addition, last 3 days she's had some atypical chest pains that have happened at rest at the sternal parasternal region. These are  transient and can last any duration from a few seconds to a few minutes and come on randomly without any aggravating or relieving factors. In addition yesterday, she resumed her normal daily walks after a week absence and she noted shortness of breath and a feeling of going to pass out  She is very upset that no diagnosis has been made and treatment prescribed has not helped her and she is new additional symptoms. She seems to be losing trust in my advice  Spirometry toay fev1  1.56L/65 % and ratio 50% - moderate obstruction. 180cc improvement in fev1 only (done wtiih patient compliant on prednisone taper but takign dulera prn and wrong technique)    REC Continue and finish prednisone taper as scheduled Continue dulera 200/5, 2 puff twice daily - learn technique Start QVAR 54mcg, 2 puiff twice daily , learn technique STart albuterol as needed, 2 puff as needed REturn in 2 weeks to see me 04/18/13 possible or NP  - spirometry at followup  OV 05/16/2013   Followup chronic cough  Last visit I emphasized that she take her [facility] diligently. Also introduced supplementary Qvar for small particle effect. She tells me that she has been taking Dulera which is puzzled as to what Qvar is. I'm not so sure that she is compliant with Dulera but she claims she is. Nevertheless her cough she says is only mildly improved but she continues to wheezing. RSI cough score is 22 and shows mild improvement. Of note, she denies any sinus drainage or acid reflux issues. She admits to constant clearing of the throat. She's frustrated and wants quick relief with cough  Past, Family, Social reviewed: no change since last visit   REC  - sinus: nasal steroid, nasal saline to continue - gerd: stop fish poil, start prilosec - asthma: contnue dulera - VCD: STart gabapentin  OV 06/26/2013     Chief Complaint  Patient presents with  . Cough    follow-up. Pt states cough is worse since last OV.  Pt states that she never started gabapentin due to indications listed on paperwork.     Now here with husband. Last visit 5 weeks ago. REcommended to continue nasal steroid/saline which she is doing, to stop fish oil which she has done but did not start prilosec thinking it was a prescription, continues dulera but did not start neurontin for VCD after reading drug insert that did not show cough indication (this is despite me counseling her this was off -label)  Now cough is worse. A week or two  ago started noticing worsening dyspnea, orthopnea, cough, wheeze. Notices wheeze to improve when she presses on throat. Went to ER 06/21/13 and dc'ed on pred taper for few days which she has finisheed (labs that day reviewed: CXR, CBC, BMET, trop, bNP, d-dimer all normal). HOwever, un-improved since then. OVerall cough is worse. She is upset at me that she is not better. RSI Cough score detailed below is 36 and shows severe symptomatology. She and husband point to throat as cause of cough  Spirometry todayL FEv1 1.77L/73%, RAtio 46, Lot of cough during expirtion. No insp loop available. She denies mul;tiple anestheia attempts or traumat to throat or thyroid issues   REC  #Chronic cough  - unclear why you are not totally bettte - need to do treatment below with 100% compliance  - for sinus (empiric)  -  continue OTC nasacort inhaler 2 squirts each nostril daily in morning  - continue 2.7% hyperteonic saline OTC  nasal spray daily at night  - for acid reflux (empiric)  - do not go back to  fish oil  - start prilosec 20mg  OTC once daily on empty stomach in morning  - for asthma  - Take prednisone 40 mg daily x 2 days, then 20mg  daily x 2 days, then 10mg  daily x 2 days, then 5mg  daily x 2 days and stop  -  take levaquin 500mg  once daily  X 5 days  -  contiunue dulera 2 puff twice daily  -  Refer to Dr Hardie Pulley for Exhaled Nitric Oxide evaluation  - for Irritable Larynx of Vocal Cord spasm producing wheeze  - hold off on  neurontin due to side effect and indication concern  - refer you to ENT   - do CT scan sinus without contrast - for chronic cough  - do CT scan neck with and without contrast - for chronic cough and stridor  #Followup At any time yuou prefer a second opinion, wil work with you on that 4 weeks with cough score at followup   [place] 07/24/2013  Chief Complaint  Patient presents with  . Cough    follow-up. Pt states cough is the same.    FU chronic cough. Result  review. COugh not better. She is frustrated that cough not better at all despite multiple test and interventinos. CMA says that she does not know her meds and is not compliant. RSI cough score is still 37 and not better. PRdnisone burst at last OV did not help   Ct chest July 2014: No pulmonary abnormalities  CT sinus Dec 2014:  shows mild chronic siusitis. She says she saw ENT and they were puzzled why I made referral. I do not have their notes   CT neck Jan 2014:  is normal except mild TMJ arthritis . ADvised to see Dentist; unrelatd to cough  Exhaled NO: She saw Dr Remus Blake. Echalend NO was < 25 (her report is being sent for scan). Per patient this was done off dulera for over week  Current meds: patinet somewhat possibly compliant with nasal steroids. Usnure if she is takig ppi. Not taking dulera x 1 month. Did nto want to try neurotin   Plan Refer [place] 10/23/2015  Chief Complaint  Patient presents with  . Follow-up    Pt last seen in 07/2013 for chronic cough. Pt states a recent CT chest on 10/13/15 showed a lung lesion. Pt c/o nonprod cough and left upper chest pain at random times. Pt denies SOB.    Follow-up chronic cough  It has been over 2 years since I saw Michelle Mack - she had refractory chronic cough and I referred her to [facility]. I have please the history together after talking to her and review of the chart. She initially saw Dr. Maxine Glenn at [facility]. It appears that a diagnosis of relapsing polychondritis was made. [employer] test showed positive ANA 1:160. July 2015 she also had a endobronchial biopsy of the left upper lobe just showed chronic bronchitis. Review of the chart suggests that she was initially on Imuran/prednisone. Then sometime in summer of 2016 approximately June 2016 she was started on CellCept and prednisone. This might of been due to a reaction to Imuran. She has been on CellCept since then. At some  point in time her K was switched over to [facility] pulmonary doctor Namen and Dr Simona Huh Ang of rheumatology. The  pulmonary notes at [facility] suggests that she has relapsing polychondritis. However the rheumatology notes at [facility] that is that she has tracheomalacia [because of collapse on the CT scan] and she did not meet all the criteria for relapsing polychondritis. Nevertheless patient is continued on CellCept all along with care at [facility]. She herself was not sure if she has relapsing polychondritis or not. She had CT chest March 2016 that did not show any adversity. Also had a chest x-ray February 2017 that was clear.  Then apparently she made at shot trip to [place] early March 2017 and on arriving that she got dyspneic. She brought herself to the ER. She had a CT chest angiogram. I personally visualized image and it shows complete left upper lobe collapse. The report also says she has no pulmonary embolism. In addition reports that she has suprahilar and mediastinal adenopathy. She apparently returned by bus to [place]. She subsequently went to [facility] emergency room where she had an x-ray was end of March 2017 that does not show any collapse.  She is now reestablishing herself with need to follow up these issues of the abnormal chest x-ray. It is not clear to me why she has decided to now follow-up here while she is followed up at [facility] in [facility] all along. She lives closer to my office but she did not express travel as an issue going to [facility]. At the same that she is asking me to do all the workup. I told her that I very limited experience with relapsing polychondritis but nevertheless she wants me to initiate the workup.  Of note I thought when I made the referral to [facility] few years ago she was not too happy with my care but she denies that is the case.   Dr Lorenza [place] Reflux Symptom Index (> 13-15 suggestive of LPR cough) 03/13/2013   05/16/2013 After dulera 06/26/2013 S/p ER visit 06/21/13 07/24/2013  10/23/2015   Hoarseness of problem with voice 3 3 5 5 2   Clearing  Of Throat 4 5 5 5 3   Excess throat mucus or feeling of post nasal drip 0 0 3 4 3   Difficulty swallowing food, liquid or tablets 4 0 0 0 1  Cough after eating or lying down 5 5 5 5 5   Breathing difficulties or choking episodes 5 4 5 5 3   Troublesome or annoying cough 5 5 5 5 5   Sensation of something sticking in throat or lump in throat 0 0 5 5 3   Heartburn, chest pain, indigestion, or stomach acid coming up 1 0 3 3 4   TOTAL 27 22 36 37 29     Kouffman Reflux v Neurogenic Cough Differentiator Reflux 03/13/2013   Do you awaken from a sound sleep coughing violently?                            With trouble breathing? Yes. sometmes  Do you have choking episodes when you cannot  Get enough air, gasping for air ?              n  Do you usually cough when you lie down into  The bed, or when you just lie down to rest ?                          Yes  Do you usually cough after meals or  eating?         Yes  Do you cough when (or after) you bend over?    No  GERD SCORE  3  Kouffman Reflux v Neurogenic Cough Differentiator Neurogenic  Do you more-or-less cough all day long? n  Does change of temperature make you cough? n  Does laughing or chuckling cause you to cough? y  Do fumes (perfume, automobile fumes, burned  Toast, etc.,) cause you to cough ?      n  Does speaking, singing, or talking on the phone cause you to cough   ?               n  Neurogenic/Airway score 1    has a past medical history of GERD (gastroesophageal reflux disease); Hypertension; Fibroid uterus; Recurrent upper respiratory infection (URI); Anemia; Back pain; Cough; Bronchitis; Infertility, female; DUB (dysfunctional uterine bleeding); Type II or unspecified type diabetes mellitus without mention of complication, not stated as uncontrolled; and Polychondritis.    has past surgical  history that includes Myomectomy (2002); Endometrial vaporization w/ versapoint; and Bronchoscopy (01/2014).    Current outpatient prescriptions:  .  albuterol (PROVENTIL) (2.5 MG/3ML) 0.083% nebulizer solution, Take 2.5 mg by nebulization every 4 (four) hours as needed for wheezing or shortness of breath. , Disp: , Rfl:  .  amLODipine (NORVASC) 5 MG tablet, TAKE 1/2 TABLET BY MOUTH DAILY, Disp: 45 tablet, Rfl: 0 .  atorvastatin (LIPITOR) 10 MG tablet, Take 10 mg by mouth daily. Reported on 09/22/2015, Disp: , Rfl:  .  ferrous sulfate 325 (65 FE) MG tablet, Take 325 mg by mouth daily with breakfast., Disp: , Rfl:  .  hydrochlorothiazide (HYDRODIURIL) 25 MG tablet, Take 25 mg by mouth daily., Disp: , Rfl: 3 .  metFORMIN (GLUCOPHAGE) 1000 MG tablet, Take 1 tablet (1,000 mg total) by mouth 2 (two) times daily with a meal., Disp: 180 tablet, Rfl: 0 .  mycophenolate (CELLCEPT) 500 MG tablet, Take 500 mg by mouth 2 (two) times daily. Reported on 09/22/2015, Disp: , Rfl:  .  predniSONE (DELTASONE) 20 MG tablet, Take 5 mg by mouth daily with breakfast. , Disp: , Rfl:   Allergies  Allergen Reactions  . Ampicillin Shortness Of Breath  . Penicillins Shortness Of Breath and Swelling    Has patient had a PCN reaction causing immediate rash, facial/tongue/throat swelling, SOB or lightheadedness with hypotension:  Has patient had a PCN reaction causing severe rash involving mucus membranes or skin necrosis:  Has patient had a PCN reaction that required hospitalization  Has patient had a PCN reaction occurring within the last 10 years:  If all of the above answers are "NO", then may proceed with Cephalosporin use.   . Cephalosporins Itching  . Nitrofuran Derivatives Itching    Throat, mouth, hands itch  . Sulfamethoxazole-Trimethoprim Other (See Comments)    "body on fire"  . Sulfur Other (See Comments)    Feeling of burning on the inside  Persist ant cough    Immunization History  Administered  Date(s) Administered  . PPD Test 04/14/2011, 06/08/2013, 01/01/2015     OBJECYIVE  Filed Vitals:   10/23/15 1332  BP: 114/72  Pulse: 92  Height: 5\' 3"  (1.6 m)  Weight: 196 lb (88.905 kg)  SpO2: 98%   Discussion visit - brief exam - cough with laryngeal quality. Hoarse periodically. Obese AE equal on both sides. Otherwise non -focal ? Saddle nose   ASSESSMENT./PLAN    ICD-9-CM ICD-10-CM   1.  Mediastinal adenopathy 785.6 R59.9   2. Collapse of left lung 518.0 J98.11   3. Relapsing polychondritis 733.99 M94.1   4. Chronic cough 786.2 R05    It is not clear to me exactly what caused collapse when she was in [place]. Clearly February 2017 she had a chest x-ray that was clear. Subsequently in the ER at [facility] details not known but she appears to have had a chest x-ray that shows resolution of the collapse. I suggested we get an x-ray but he was talking about "I have a lesion" and I want a PET scan. The only current abnormality that I can find that requires a PET scan is the lymphadenopathy in the mediastinum. This on the reported on the CT scan. She feels a PET scan be a better test to identify presence of collapse and also there is abnormal lymphadenopathy.  We will get a PET scan and proceed from there. Ideally would be best for her to reestablish herself at [facility] where she has both pulmonary and rheumatology providers and obviously complex situation  PLAN   Do PET scan  > 50% of this > 25 min visit spent in face to face counseling or coordination of care     Dr. Brand Males, M.D., [facility].C.P Pulmonary and Critical Care Medicine Staff Physician [place] [facility] Pager: [phone], If no answer or between  15:00h - 7:00h: call [phone]  10/23/2015 2:01 PM

## 2015-10-23 NOTE — Patient Instructions (Addendum)
ICD-9-CM ICD-10-CM   1. Mediastinal adenopathy 785.6 R59.9   2. Collapse of left lung 518.0 J98.11   3. Relapsing polychondritis 733.99 M94.1   4. Chronic cough 786.2 R05    Do PET Scan  Followup  -after PET scan to see me

## 2015-10-24 ENCOUNTER — Telehealth: Payer: Self-pay | Admitting: Internal Medicine

## 2015-10-24 NOTE — Telephone Encounter (Signed)
I was informed pt if claustrophobic and is needing an anti-anxiety med to help with the upcoming PET scan. Called and spoke to pt and confirmed this with her, pt states she does not think she will need it cause she will not be closed inside it. Pt also requested to speak directly with MR about PET scan. Pt states she was told that because she has systemic inflammation her PET scan will come back positive, pt questioning if she should have CT scan. Pt requesting MR call her directly at [phone].   MR please advise. Thanks.

## 2015-10-24 NOTE — Telephone Encounter (Signed)
Discussed with patient and re-explained indication for PET. She has agreed to go ahedad  Dr. Brand Males, M.D., [facility].C.P Pulmonary and Critical Care Medicine Staff Physician [place] [facility] Pager: [phone], If no answer or between  15:00h - 7:00h: call [phone]  10/24/2015 5:21 PM

## 2015-10-30 ENCOUNTER — Ambulatory Visit (HOSPITAL_COMMUNITY)
Admission: RE | Admit: 2015-10-30 | Discharge: 2015-10-30 | Disposition: A | Discharge status: Home / Self Care | Payer: Managed Care, Other (non HMO) | Source: Ambulatory Visit | Attending: Internal Medicine | Admitting: Internal Medicine

## 2015-10-30 DIAGNOSIS — R590 Localized enlarged lymph nodes: Secondary | ICD-10-CM

## 2015-10-30 DIAGNOSIS — R599 Enlarged lymph nodes, unspecified: Secondary | ICD-10-CM | POA: Diagnosis not present

## 2015-10-30 DIAGNOSIS — N852 Hypertrophy of uterus: Secondary | ICD-10-CM | POA: Diagnosis not present

## 2015-10-30 DIAGNOSIS — J398 Other specified diseases of upper respiratory tract: Secondary | ICD-10-CM | POA: Diagnosis not present

## 2015-10-30 LAB — GLUCOSE, CAPILLARY: GLUCOSE-CAPILLARY: 103 mg/dL — AB (ref 65–99)

## 2015-10-30 MED ORDER — FLUDEOXYGLUCOSE F - 18 (FDG) INJECTION
11.7000 | Freq: Once | INTRAVENOUS | Status: AC | PRN
  Administered 2015-10-30: 11.7 via INTRAVENOUS

## 2015-11-11 ENCOUNTER — Ambulatory Visit: Payer: Managed Care, Other (non HMO) | Admitting: Internal Medicine

## 2015-11-11 ENCOUNTER — Telehealth: Payer: Self-pay | Admitting: Family Medicine

## 2015-11-11 NOTE — Telephone Encounter (Signed)
Rcvd 90 day refill request for Atorvastatin 10 mg

## 2015-11-12 NOTE — Telephone Encounter (Signed)
Patient does not need-I will call pharmacy.

## 2015-11-13 ENCOUNTER — Ambulatory Visit (INDEPENDENT_AMBULATORY_CARE_PROVIDER_SITE_OTHER): Payer: Managed Care, Other (non HMO) | Admitting: Internal Medicine

## 2015-11-13 ENCOUNTER — Encounter: Payer: Self-pay | Admitting: Internal Medicine

## 2015-11-13 VITALS — BP 116/72 | HR 88 | Ht 63.0 in | Wt 190.8 lb

## 2015-11-13 DIAGNOSIS — D259 Leiomyoma of uterus, unspecified: Secondary | ICD-10-CM | POA: Insufficient documentation

## 2015-11-13 DIAGNOSIS — J9811 Atelectasis: Secondary | ICD-10-CM | POA: Diagnosis not present

## 2015-11-13 DIAGNOSIS — M941 Relapsing polychondritis: Secondary | ICD-10-CM

## 2015-11-13 DIAGNOSIS — J398 Other specified diseases of upper respiratory tract: Secondary | ICD-10-CM | POA: Insufficient documentation

## 2015-11-13 NOTE — Progress Notes (Signed)
Subjective:     Patient ID: Michelle Mack, female   DOB: 29-Feb-1966, 50 y.o.   MRN: [id number]  HPI    IOV 06/11/2011  50 year female. Cough for 9 months. PMD is Dr [name] (formerly PCP Dr Criss Rosales). Referred by Dr Verdia Kuba. 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 Non-smoker.   Insidious onset of cough for 9 months. INnitially PMD Dr Criss Rosales considered GERD etiology. Starrted on Rx for GERD. Then referred to Dr Collene Mares who she has been seeing for several months. PAtient states s/p BRAVO study last week (done off dexilant for 3 days) and reportedly normal. Dexilant was not helpful. Coughs all the time, intermittently. Rates severity as mild. Considers quality: barking, laryngeal cough. Made worse after dinner or feeling hot - at this time she feels it is bronchitis. Smoke, dust, cold air have no impact on cough. Made better by unclear. Dry cough. No nocturnal cough. CT chest June 2012 - normal (personally reviewed). Does not use humidifier. No mold in the house. Denies sinus drainage. Denies currently tickle in throat. On ARB for many years. Has Dulera past week but not tried it. Takes once month advil for ovarian cycles prn. Does admit to wheeze x 3 days. No dyspnea.   RSI score is 11 - level 5 cough after lying down, level 3 sensation of lump in throat, level 2 - choking, level 1 - hoarse voice.    No family hx of asthma. Spirometry today is normal. 2.08L/85%  REC Unclear why you are coughing  Please do spirometry here today  Please have methacholine challenge test and then come back to follow with me    OV 03/13/2013 Followup chronic cough. I have not seen her since December 2012; coming up 22 months since last visit.  At that time spirometry was normal and I recommended a methacholine challenge test. However, she did not follow through with that. She says in the past almost 2 years cough persists unchanged. It is dry. It is present all day. Rates it as mild to moderate and  severe. She is not sure exactly why she has not followed up but she says that she did have autoimmune evaluation for chronic cough and saw Dr. Gavin Pound in rheumatology and her lupus titers were elevated but SLE has been ruled out. She says she also saw the cardiologist Dr. Tollie Eth for chronic cough and recollect having had a cardiac stress test that was normal.   Cardiologist ordered  CT chest 01/16/2013 that showed normal lung parenchyma ; I personally review this film. No Coronary artery calcification either (echo not done per patient)  RSI cough score today is 27 and reflects irritable larynx syndrome. Cough differentiator score shows reflux as a cause for cough but she says this is been ruled out but gastroenterology   REC Methacholine challenge test  OV 04/05/2013 Followup chronic cough after methacholine challenge test  On 03/27/13: She went for methacholine challenge test but this was not even started because baseline spirometry showed : Spirometry Fev1 1.38L/56%, FVC 3L/100%, RAtio 46. Obstruction moderate. On fV loop lot of cough. ? VCD. Therefore on 03/28/2013 she started a 12 day prednisone taper along with Dulera. She is now midway through her prednisone and she says she is compliant with her Ruthe Mannan but her cough has not improved at all and it persists unchanged. Discovered she is taking dulera prn basis and she is rinsing her mdi with water after use. However, mdi technique  is good thou In addition, last 3 days she's had some atypical chest pains that have happened at rest at the sternal parasternal region. These are transient and can last any duration from a few seconds to a few minutes and come on randomly without any aggravating or relieving factors. In addition yesterday, she resumed her normal daily walks after a week absence and she noted shortness of breath and a feeling of going to pass out  She is very upset that no diagnosis has been made and treatment prescribed has  not helped her and she is new additional symptoms. She seems to be losing trust in my advice  Spirometry toay fev1 1.56L/65 % and ratio 50% - moderate obstruction. 180cc improvement in fev1 only (done wtiih patient compliant on prednisone taper but takign dulera prn and wrong technique)    REC Continue and finish prednisone taper as scheduled Continue dulera 200/5, 2 puff twice daily - learn technique Start QVAR 11mg, 2 puiff twice daily , learn technique STart albuterol as needed, 2 puff as needed REturn in 2 weeks to see me 04/18/13 possible or NP  - spirometry at followup  OV 05/16/2013   Followup chronic cough  Last visit I emphasized that [facility]. Also introduced supplementary Qvar for small particle effect. She tells me that she has been taking Dulera which is puzzled as to what Qvar is. I'm not so sure that she is compliant with Dulera but she claims she is. Nevertheless her cough she says is only mildly improved but she continues to wheezing. RSI cough score is 22 and shows mild improvement. Of note, she denies any sinus drainage or acid reflux issues. She admits to constant clearing of the throat. She's frustrated and wants quick relief with cough  Past, Family, Social reviewed: no change since last visit   REC  - sinus: nasal steroid, nasal saline to continue - gerd: stop fish poil, start prilosec - asthma: contnue dulera - VCD: STart gabapentin  OV 06/26/2013     Chief Complaint  Patient presents with  . Cough    follow-up. Pt states cough is worse since last OV.  Pt states that she never started gabapentin due to indications listed on paperwork.     Now here with husband. Last visit 5 weeks ago. REcommended to continue nasal steroid/saline which she is doing, to stop fish oil which she has done but did not start prilosec thinking it was a prescription, continues dulera but did not start neurontin for VCD after reading drug insert that did  not show cough indication (this is despite me counseling her this was off -label)  Now cough is worse. A week or two ago started noticing worsening dyspnea, orthopnea, cough, wheeze. Notices wheeze to improve when she presses on throat. Went to ER 06/21/13 and dc'ed on pred taper for few days which she has finisheed (labs that day reviewed: CXR, CBC, BMET, trop, bNP, d-dimer all normal). HOwever, un-improved since then. OVerall cough is worse. She is upset at me that she is not better. RSI Cough score detailed below is 36 and shows severe symptomatology. She and husband point to throat as cause of cough  Spirometry todayL FEv1 1.77L/73%, RAtio 46, Lot of cough during expirtion. No insp loop available. She denies mul;tiple anestheia attempts or traumat to throat or thyroid issues   REC  #Chronic cough  - unclear why you are not totally bettte - need to do treatment below with 100% compliance  -  for sinus (empiric)  -  continue OTC nasacort inhaler 2 squirts each nostril daily in morning  - continue 2.7% hyperteonic saline OTC nasal spray daily at night  - for acid reflux (empiric)  - do not go back to  fish oil  - start prilosec 50m OTC once daily on empty stomach in morning  - for asthma  - Take prednisone 40 mg daily x 2 days, then 240mdaily x 2 days, then 1053maily x 2 days, then 5mg43mily x 2 days and stop  -  take levaquin 500mg71me daily  X 5 days  -  contiunue dulera 2 puff twice daily  -  Refer to Dr Meg WHardie PulleyExhaled Nitric Oxide evaluation  - for Irritable Larynx of Vocal Cord spasm producing wheeze  - hold off on  neurontin due to side effect and indication concern  - refer you to ENT   - do CT scan sinus without contrast - for chronic cough  - do CT scan neck with and without contrast - for chronic cough and stridor  #Followup At any time yuou prefer a second opinion, wil work with you on that 4 weeks with cough score at followup   OV 1/[place]/2015  Chief  Complaint  Patient presents with  . Cough    follow-up. Pt states cough is the same.    FU chronic cough. Result review. COugh not better. She is frustrated that cough not better at all despite multiple test and interventinos. CMA says that she does not know her meds and is not compliant. RSI cough score is still 37 and not better. PRdnisone burst at last OV did not help   Ct chest July 2014: No pulmonary abnormalities  CT sinus Dec 2014:  shows mild chronic siusitis. She says she saw ENT and they were puzzled why I made referral. I do not have their notes   CT neck Jan 2014:  is normal except mild TMJ arthritis . ADvised to see Dentist; unrelatd to cough  Exhaled NO: She saw Dr WhaleRemus Blakealend NO was < 25 (her report is being sent for scan). Per patient this was done off dulera for over week  Current meds: patinet somewhat possibly compliant with nasal steroids. Usnure if she is takig ppi. Not taking dulera x 1 month. Did nto want to try neurotin   Plan [facility] [place]/2017  Chief Complaint  Patient presents with  . Follow-up    Pt last seen in 07/2013 for chronic cough. Pt states a recent CT chest on 10/13/15 showed a lung lesion. Pt c/o nonprod cough and left upper chest pain at random times. Pt denies SOB.    Follow-up chronic cough  It has been over 2 years since I saw SheilMargot Ablese had refractory chronic cough and [facility] together after talking to her and review of the chart. [facility] a diagnosis of relapsing polychondritis was made. [facility] test showed positive ANA 1:160. July 2015 she also had a endobronchial biopsy of the left upper lobe just showed chronic bronchitis. Review of the chart suggests that she was initially on Imuran/prednisone. Then sometime in summer of 2016 approximately June 2016 she was started on  CellCept and prednisone. This might of been due to a reaction to Imuran. She has been on CellCept since then.  At some point in time her K was switched over to [facility] pulmonary doctor Namen and Dr Simona Huh Ang of rheumatology. The pulmonary notes at [facility] suggests that she has relapsing polychondritis. However the rheumatology notes at [facility] that is that she has tracheomalacia [because of collapse on the CT scan] and she did not meet all the criteria for relapsing polychondritis. Nevertheless patient is continued on CellCept all along with care at [facility]. She herself was not sure if she has relapsing polychondritis or not. She had CT chest March 2016 that did not show any adversity. Also had a chest x-ray February 2017 that was clear.  Then apparently she made at shot trip to [place] early March 2017 and on arriving that she got dyspneic. She brought herself to the ER. She had a CT chest angiogram. I personally visualized image and it shows complete left upper lobe collapse. The report also says she has no pulmonary embolism. In addition reports that she has suprahilar and mediastinal adenopathy. She apparently returned by bus to [place]. She subsequently went to [facility] emergency room where she had an x-ray was end of March 2017 that does not show any collapse.  She is now reestablishing herself with need to follow up these issues of the abnormal chest x-ray. It is not clear to me why she has decided to now follow-up here while she is followed up at [facility] [place] all along. She lives closer to my office but she did not express travel as an issue going to [facility]. At the same that she is asking me to do all the workup. I told her that I very limited experience with relapsing polychondritis but nevertheless she wants me to initiate the workup.  Of note I thought when I made the referral to [facility] few years ago she was not too happy with my care  but she denies that is the case.   OV 11/13/2015  Chief Complaint  Patient presents with  . Follow-up    Pt here after PET scan. Pt denies changes in her breathing. Pt denies cough and CP/tightness.    Presents with her husband are discussed PET scan results. The PET scan shows resolution of her lung collapse and no evidence of PET active mediastinal or hilar adenopathy. At this point in time she is stable. She is relieved to know the results. PET scan does show the possibility of uterine fibroids. She does me that since her [place] to her CellCept is on hold. The CellCept and prednisone are being monitored by the primary pulmonologist at [facility] area she is now here with her husband. She is again not sure where she wants to all up in future for her relapsing polychondritis. I had a detailed discussion about this with her. They finally established that it is in her best interest that [facility] continue to be primary pulmonary for her relapsing polychondritis and monitor her CellCept and prednisone with me being back up in [place] for any acute events. She and her husband are agreeable with the plan. This because of the complexity of the problem and the fact she is on immunomodulating drugs.    PET scan 10/30/15 IMPRESSION: 1. No enlarged or hypermetabolic lymph nodes in the neck, chest, abdomen or pelvis. 2. Minimal tracheal wall thickening involving the cartilaginous tracheal wall with associated mild tracheal narrowing at the level of the mid trachea, consistent with the provided history of relapsing  polychondritis with possible mild mid tracheal stenosis. No active pulmonary disease. 3. Enlarged anteverted uterus with diffuse low-level myometrial hypermetabolism, with no discrete uterine mass or focal uterine hypermetabolism. This finding is nonspecific and could be due to the diffuse adenomyosis of the uterus and/or uterine fibroids. Endometrial pathology cannot be  excluded on the basis of this study. The endometrium and myometrium were not well visualized on the 03/18/2015 pelvic sonogram. Consider correlation with pelvic MRI as clinically warranted.   Electronically Signed  By: Ilona Sorrel M.D.  On: 10/30/2015 15:44         Dr Lorenza Cambridge Reflux Symptom Index (> 13-15 suggestive of LPR cough) 03/13/2013  05/16/2013 After dulera 06/26/2013 S/p ER visit 06/21/13 07/24/2013  10/23/2015   Hoarseness of problem with voice 3 3 5 5 2   Clearing  Of Throat 4 5 5 5 3   Excess throat mucus or feeling of post nasal drip 0 0 3 4 3   Difficulty swallowing food, liquid or tablets 4 0 0 0 1  Cough after eating or lying down 5 5 5 5 5   Breathing difficulties or choking episodes 5 4 5 5 3   Troublesome or annoying cough 5 5 5 5 5   Sensation of something sticking in throat or lump in throat 0 0 5 5 3   Heartburn, chest pain, indigestion, or stomach acid coming up 1 0 3 3 4   TOTAL 27 22 36 37 29     Kouffman Reflux v Neurogenic Cough Differentiator Reflux 03/13/2013   Do you awaken from a sound sleep coughing violently?                            With trouble breathing? Yes. sometmes  Do you have choking episodes when you cannot  Get enough air, gasping for air ?              n  Do you usually cough when you lie down into  The bed, or when you just lie down to rest ?                          Yes  Do you usually cough after meals or eating?         Yes  Do you cough when (or after) you bend over?    No  GERD SCORE  3  Kouffman Reflux v Neurogenic Cough Differentiator Neurogenic  Do you more-or-less cough all day long? n  Does change of temperature make you cough? n  Does laughing or chuckling cause you to cough? y  Do fumes (perfume, automobile fumes, burned  Toast, etc.,) cause you to cough ?      n  Does speaking, singing, or talking on the phone cause you to cough   ?               n  Neurogenic/Airway score 1    Review of Systems      Objective:   Physical Exam  Filed Vitals:   11/13/15 0846  BP: 116/72  Pulse: 88  Height: 5\' 3"  (1.6 m)  Weight: 190 lb 12.8 oz (86.546 kg)  SpO2: 98%   Estimated body mass index is 33.81 kg/(m^2) as calculated from the following:   Height as of this encounter: 5\' 3"  (1.6 m).   Weight as of this encounter: 190 lb 12.8 oz (86.546 kg).  Obese lady. Sitting pleasantly. Mild inspiratory  noise during deep inspiration. Lungs are clear to auscultation bilaterally. Normal heart sounds. Alert and oriented 3. No edema. Skin appears intact in the exposed areas.      Assessment:       ICD-9-CM ICD-10-CM   1. Relapsing polychondritis 733.99 M94.1   2. Collapse of left lung 518.0 J98.11   3. 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 Uterine leiomyoma, unspecified location 218.9 D25.9         Plan:      - #Lung collapse and mediatsinal adenopathy  - Resolved on PET scan 10/30/2015. No active follow-up  #Relapsing polychondritis and tracheal stenosis - Primary physician for this would be the [facility] pulmonologist. - I will call them and let them know - CellCept and prednisone according to [facility] - I will not be responsible for this treatment program but will be back up  #Possible fibroids in the uterus seen on PET scan 10/30/2015 - Radiology is recommending pelvic MRI please talk about this with your primary care physician KNAPP,EVE A, MD or primary gynecologist.  #Follow-up - 6-8 months or sooner if needed.   > 50% of this > 25 min visit spent in face to face counseling or coordination of care   Dr. Brand Males, M.D., [facility].C.P Pulmonary and Critical Care Medicine Staff Physician [place] [facility] Pager: [phone], If no answer or between  15:00h - 7:00h: call [phone]  11/13/2015 9:18 AM

## 2015-11-13 NOTE — Patient Instructions (Signed)
ICD-9-CM ICD-10-CM   1. Relapsing polychondritis 733.99 M94.1   2. Collapse of left lung 518.0 J98.11   3. 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 Uterine leiomyoma, unspecified location 218.9 D25.9    - #Lung collaps eand mediatsinal adenopathy  - Resolved on PET scan 10/30/2015. No active follow-up  #Relapsing polychondritis and tracheal stenosis - Primary physician for this would be the [facility] pulmonologist. - I will call them and let them know - CellCept and prednisone according to [facility] - I will not be responsible for this treatment program but will be back up  #Possible fibroids in the uterus seen on PET scan 10/30/2015 - Radiology is recommending pelvic MRI please talk about this with your primary care physician KNAPP,EVE A, MD or primary gynecologist.  #Follow-up - 6-8 months or sooner if needed.

## 2015-11-19 ENCOUNTER — Encounter: Payer: Self-pay | Admitting: Family Medicine

## 2015-11-26 ENCOUNTER — Encounter: Payer: Self-pay | Admitting: Family Medicine

## 2015-11-26 ENCOUNTER — Ambulatory Visit (INDEPENDENT_AMBULATORY_CARE_PROVIDER_SITE_OTHER): Payer: Managed Care, Other (non HMO) | Admitting: Family Medicine

## 2015-11-26 VITALS — BP 120/60 | HR 84 | Ht 63.0 in | Wt 194.2 lb

## 2015-11-26 DIAGNOSIS — E7800 Pure hypercholesterolemia, unspecified: Secondary | ICD-10-CM | POA: Diagnosis not present

## 2015-11-26 DIAGNOSIS — Z5181 Encounter for therapeutic drug level monitoring: Secondary | ICD-10-CM

## 2015-11-26 DIAGNOSIS — K5900 Constipation, unspecified: Secondary | ICD-10-CM | POA: Diagnosis not present

## 2015-11-26 DIAGNOSIS — N182 Chronic kidney disease, stage 2 (mild): Secondary | ICD-10-CM | POA: Diagnosis not present

## 2015-11-26 DIAGNOSIS — E119 Type 2 diabetes mellitus without complications: Secondary | ICD-10-CM | POA: Diagnosis not present

## 2015-11-26 DIAGNOSIS — D649 Anemia, unspecified: Secondary | ICD-10-CM

## 2015-11-26 DIAGNOSIS — E559 Vitamin D deficiency, unspecified: Secondary | ICD-10-CM | POA: Diagnosis not present

## 2015-11-26 DIAGNOSIS — E1122 Type 2 diabetes mellitus with diabetic chronic kidney disease: Secondary | ICD-10-CM | POA: Diagnosis not present

## 2015-11-26 DIAGNOSIS — I10 Essential (primary) hypertension: Secondary | ICD-10-CM

## 2015-11-26 LAB — CBC WITH DIFFERENTIAL/PLATELET
Basophils Absolute: 0 cells/uL (ref 0–200)
Basophils Relative: 0 %
EOS PCT: 3 %
Eosinophils Absolute: 228 cells/uL (ref 15–500)
HEMATOCRIT: 38.1 % (ref 35.0–45.0)
Hemoglobin: 12.5 g/dL (ref 11.7–15.5)
LYMPHS PCT: 25 %
Lymphs Abs: 1900 cells/uL (ref 850–3900)
MCH: 27.9 pg (ref 27.0–33.0)
MCHC: 32.8 g/dL (ref 32.0–36.0)
MCV: 85 fL (ref 80.0–100.0)
MONO ABS: 608 {cells}/uL (ref 200–950)
MONOS PCT: 8 %
MPV: 9.2 fL (ref 7.5–12.5)
NEUTROS PCT: 64 %
Neutro Abs: 4864 cells/uL (ref 1500–7800)
PLATELETS: 331 10*3/uL (ref 140–400)
RBC: 4.48 MIL/uL (ref 3.80–5.10)
RDW: 14.3 % (ref 11.0–15.0)
WBC: 7.6 10*3/uL (ref 4.0–10.5)

## 2015-11-26 LAB — COMPREHENSIVE METABOLIC PANEL
ALBUMIN: 4.1 g/dL (ref 3.6–5.1)
ALK PHOS: 68 U/L (ref 33–115)
ALT: 17 U/L (ref 6–29)
AST: 14 U/L (ref 10–35)
BILIRUBIN TOTAL: 0.8 mg/dL (ref 0.2–1.2)
BUN: 8 mg/dL (ref 7–25)
CO2: 27 mmol/L (ref 20–31)
CREATININE: 0.87 mg/dL (ref 0.50–1.10)
Calcium: 9.5 mg/dL (ref 8.6–10.2)
Chloride: 100 mmol/L (ref 98–110)
Glucose, Bld: 107 mg/dL — ABNORMAL HIGH (ref 65–99)
Potassium: 3.8 mmol/L (ref 3.5–5.3)
SODIUM: 138 mmol/L (ref 135–146)
TOTAL PROTEIN: 7.1 g/dL (ref 6.1–8.1)

## 2015-11-26 LAB — LIPID PANEL
Cholesterol: 179 mg/dL (ref 125–200)
HDL: 48 mg/dL (ref >=46)
LDL CALC: 108 mg/dL (ref <130)
Total CHOL/HDL Ratio: 3.7 Ratio (ref <=5.0)
Triglycerides: 117 mg/dL (ref <150)
VLDL: 23 mg/dL (ref <30)

## 2015-11-26 LAB — FERRITIN: FERRITIN: 88 ng/mL (ref 10–232)

## 2015-11-26 LAB — POCT GLYCOSYLATED HEMOGLOBIN (HGB A1C): HEMOGLOBIN A1C: 7.2

## 2015-11-26 MED ORDER — AMLODIPINE BESYLATE 5 MG PO TABS: 5.0000 mg | ORAL_TABLET | Freq: Every day | ORAL | Status: DC

## 2015-11-26 MED ORDER — ATORVASTATIN CALCIUM 10 MG PO TABS: 10.0000 mg | ORAL_TABLET | Freq: Every day | ORAL | Status: DC

## 2015-11-26 NOTE — Patient Instructions (Signed)
  Your constipation and black stools are likely from the iron. Not sure if you need to be taking iron--we will be checking bloodwork today to help answer that question. You should start taking 2 Colace (stool softeners) daily to help with the constipation.  And if iron is found to NOT be needed, you can stop the stool softeners if/when your constipation improves.  You should be taking 1000 IU of Vitamin D3 every day.  If your Vitamin D levels have dropped significantly (due to not taking a supplement for the last few months) then we will contact you and give you additional prescription Vitamin D.  Your diabetes seems to be adequately controlled per your sugars at home.  The A1c is above goal (goal is <7) due to higher sugars in the last 90 days related to prednisone use.  If not being put back on prednisone, and sugars stay <135, no additional medications are needed. If your sugars increase to 140's, and/or you are put back on prednisone (in which case sugars will likely be even higher), then I recommend adding another diabetes medication.  We briefly discussed Farxiga/Jardiance/Invokana as a class of medications that might work well for you, and not cause hypoglycemia (low sugar) if/when your prednisone is stopped.   Since your blood pressure seems to be fine on your current medication (which includes the higher dose of amlodipine), we will continue both of these.  In the future, please contact us if you are having issues with high blood pressure, so that a physician can help make adjustments to your medications rather than you adjusting them on your own.

## 2015-11-26 NOTE — Progress Notes (Signed)
Chief Complaint  Patient presents with  . Diabetes    fasting med check.   She reports having black stools, and complaining of pain on the whole left side of her body x 2 weeks--whole body (face, neck, chest, left arm, leg).  She has been off prednisone for the last 2 weeks, and she thinks the pain started after stopping it. She had tapered down, and last dose prior to stopping was 2.'5mg'$  daily. In the past she has had similar problems with pain recurring when off prednisone, but usually just affecting the chest.  This is managed by pulmonary at [facility], and she was there last week and informed them of this problem.  She was advised to restart the CellCept. She started it, but stopped it 2 days ago due to some URI symptoms. She has a slight cough from the cold symptoms; denies shortness of breath.  Black stools--she has been taking iron and has constipation.  She reports having the black stools for over a month. She denies any abdominal pain, blood in the stool. She isn't exactly sure who told her to take iron--thinks she started taking it in error instead of Vitamin D (see below).  Diabetes: She didn't bring her glucose log. Blood sugars are running 120's in the mornings, and similarly prior to eating dinner.  When on prednisone, her sugars had been higher, 150's, improved since stopping prednisone. She is taking metformin without side effects. Recalls having diarrhea from onglyza in the past She went to the eye doctor earlier this year.  Hypertension follow-up: She states that she has been taking a full tablet of the amlodipine for a month.  She states that BP's had been elevated to 180's in April, she was having headaches, and she self-adjusted the dose up to '5mg'$  on her own, without consulting any of her multiple physicians. (see phone call--where it was reported that she left bottle somewhere, not that dose was changed, no mention of high blood pressure or headaches). She continues to take the '25mg'$  of  HCTZ along with '5mg'$  of amlodipine.  She checks BP at home, running 120's/60-80.Marland Kitchen Denies dizziness. No exertional chest pain, no palpitations. In reviewing her chart, she is a diabetic with hypertension who is not on ACEI or ARB. She previously took Diovan HCT, which was stopped due to cough. Cough never improved--unrelated to the ARB. Her urine microalbumin was normal on last check.   She is under the care of a nephrologist (Dr. Posey Pronto), who added HCTZ in 07/2015 to her amlodipine (she had previously been on 12.'5mg'$  dose).  Denies any edema, dizziness, or other side effects.  Her toes will sometimes cramp/curl in the mornings when stretching; no other muscle spasms. She had a headache last night. Having some URI symptoms today, but no headache.  Hypercholesterolemia:She is compliant with following lowfat, low cholesterol diet.  Compliant with taking atorvastatin and denies side effects.  Admits to having stopped statin for a couple of months prior to her last visit. +ice cream  Vitamin D deficiency--she took prescription course of Vitamin D after December 2016 labs. It was last checked in February, and level was 35 (prior to completing the rx course). She has NOT been taking any vitamin D supplement. She has been taking iron--got confused.  She may have started taking the iron daily instead of vitamin D.  Last CBC was in hospital in March--Hg 11.9, plt 407.  PMH, PSH, SH reviewed  Outpatient Encounter Prescriptions as of 11/26/2015  Medication Sig Note  .  amLODipine (NORVASC) 5 MG tablet TAKE 1/2 TABLET BY MOUTH DAILY 11/26/2015: Taking full tablet for about 2 months  . atorvastatin (LIPITOR) 10 MG tablet Take 10 mg by mouth daily. Reported on 09/22/2015   . ferrous sulfate 325 (65 FE) MG tablet Take 325 mg by mouth daily with breakfast.   . hydrochlorothiazide (HYDRODIURIL) 25 MG tablet Take 25 mg by mouth daily.   . metFORMIN (GLUCOPHAGE) 1000 MG tablet Take 1 tablet (1,000 mg total) by mouth  2 (two) times daily with a meal.   . albuterol (PROVENTIL) (2.5 MG/3ML) 0.083% nebulizer solution Take 2.5 mg by nebulization every 4 (four) hours as needed for wheezing or shortness of breath. Reported on 11/26/2015   . mycophenolate (CELLCEPT) 500 MG tablet Take 500 mg by mouth 2 (two) times daily. Reported on 11/26/2015 09/27/2015: Pt stopped taking about 2 weeks but will resume medication in the next day or say   . [DISCONTINUED] predniSONE (DELTASONE) 20 MG tablet Take 5 mg by mouth daily with breakfast.     No facility-administered encounter medications on file as of 11/26/2015.   Allergies reviewed.  ROS:  No fever, chills.  +headache yesterday, resolved today.  +URI symptoms with scratchy throat, slight congestion and minimal cough. No ear pain.  +left sided whole body pain and black stools and constipation as per HPI.  No nausea, vomiting, heartburn, abdominal pain, urinary complaints, bleeding, bruising, rash, depression, or other complaints except as per HPI.  PHYSICAL EXAM: BP 120/60 mmHg  Pulse 84  Ht 5' 3"  (1.6 m)  Wt 194 lb 3.2 oz (88.089 kg)  BMI 34.41 kg/m2  LMP 10/09/2015  Well developed, pleasant female in no distress.  Very slightly scratchy voice HEENT: PERRL EOMI, conjunctiva and sclera are clear.  TM's and EAC's normal.  Nasal mucosa is minimally edematous, no erythema or purulence. Sinuses are nontender, OP is clear Neck: no lymphadenopathy, thyromegaly or mass Heart: regular rate and rhythm Lungs: clear bilaterally Back: no spinal or CVA tenderness Abdomen: soft, nontender, no organomegaly or mass Extremities: no edema, normal pulses Psych: normal mood, affect, hygiene and grooming Neuro: alert and oriented; cranial nerves intact; normal strength, gait   Lab Results  Component Value Date   HGBA1C 7.2 11/26/2015   ASSESSMENT/PLAN:  Controlled type 2 diabetes mellitus with stage 2 chronic kidney disease, without long-term current use of insulin (HCC) -  borderline control of DM, above goal, contributed by use of prednisone.  Now off prednisone. CPM: add med (ie Jardiance) if prednisone restarted and sugars high  Diabetes mellitus without complication ([place]) - Plan: HgB A1c, Comprehensive metabolic panel  Essential hypertension, benign - BP controlled on current regimen; advised not to change meds without consulting MD - Plan: Comprehensive metabolic panel, amLODipine (NORVASC) 5 MG tablet  Vitamin D deficiency - recheck; inadvertantly taking iron instead of Vitamin D supplement. 1000 IU daily recommended - Plan: VITAMIN D 25 Hydroxy (Vit-D Deficiency, Fractures)  Anemia, unspecified anemia type - no recent anemia, so doubt need for iron supplement. check CBC, ferritin - Plan: CBC with Differential/Platelet, Ferritin  Medication monitoring encounter - Plan: Lipid panel, Comprehensive metabolic panel, CBC with Differential/Platelet, VITAMIN D 25 Hydroxy (Vit-D Deficiency, Fractures), Ferritin  Pure hypercholesterolemia - now complaint with statin; reviewed diet - Plan: atorvastatin (LIPITOR) 10 MG tablet  Constipation, unspecified constipation type   c-met, lipids, CBC, Vitamin D Ferritin    Your constipation and black stools are likely from the iron. Not sure if you need to be taking  iron--we will be checking bloodwork today to help answer that question. You should start taking 2 Colace (stool softeners) daily to help with the constipation.  And if iron is found to NOT be needed, you can stop the stool softeners if/when your constipation improves.  You should be taking 1000 IU of Vitamin D3 every day.  If your Vitamin D levels have dropped significantly (due to not taking a supplement for the last few months) then we will contact you and give you additional prescription Vitamin D.  Your diabetes seems to be adequately controlled per your sugars at home.  The A1c is above goal (goal is <7) due to higher sugars in the last 90 days related to  prednisone use.  If not being put back on prednisone, and sugars stay <135, no additional medications are needed. If your sugars increase to 140's, and/or you are put back on prednisone (in which case sugars will likely be even higher), then I recommend adding another diabetes medication.  We briefly discussed Farxiga/Jardiance/Invokana as a class of medications that might work well for you, and not cause hypoglycemia (low sugar) if/when your prednisone is stopped.   Since your blood pressure seems to be fine on your current medication (which includes the higher dose of amlodipine), we will continue both of these.  In the future, please contact us if you are having issues with high blood pressure, so that a physician can help make adjustments to your medications rather than you adjusting them on your own.

## 2015-11-27 LAB — VITAMIN D 25 HYDROXY (VIT D DEFICIENCY, FRACTURES): Vit D, 25-Hydroxy: 26 ng/mL — ABNORMAL LOW (ref 30–100)

## 2015-11-27 MED ORDER — ATORVASTATIN CALCIUM 20 MG PO TABS: 20.0000 mg | ORAL_TABLET | Freq: Every day | ORAL | Status: DC

## 2015-11-27 NOTE — Addendum Note (Signed)
Addended by: [name] on: 11/27/2015 08:31 AM   Modules accepted: Orders

## 2015-12-04 ENCOUNTER — Telehealth: Payer: Self-pay

## 2015-12-04 NOTE — Telephone Encounter (Signed)
Fax Rcvd for 90

## 2015-12-18 ENCOUNTER — Encounter: Payer: Self-pay | Admitting: Family Medicine

## 2016-02-23 ENCOUNTER — Encounter: Payer: Self-pay | Admitting: Family Medicine

## 2016-02-23 ENCOUNTER — Encounter: Payer: Self-pay | Admitting: *Deleted

## 2016-02-23 ENCOUNTER — Ambulatory Visit (INDEPENDENT_AMBULATORY_CARE_PROVIDER_SITE_OTHER): Payer: Managed Care, Other (non HMO) | Admitting: Family Medicine

## 2016-02-23 VITALS — BP 112/70 | HR 80 | Wt 198.0 lb

## 2016-02-23 DIAGNOSIS — S30811A Abrasion of abdominal wall, initial encounter: Secondary | ICD-10-CM

## 2016-02-23 DIAGNOSIS — S301XXA Contusion of abdominal wall, initial encounter: Secondary | ICD-10-CM

## 2016-02-23 NOTE — Progress Notes (Signed)
   Subjective:    Patient ID: Michelle Mack, female    DOB: Jun 03, 1966, 50 y.o.   MRN: [id number]  HPI She is here for evaluation of a laceration on her stomach and concern about infection. She states that while moving a dresser drawer hit her in the abdomen.   Review of Systems     Objective:   Physical Exam Superficial 3 cm abrasion is noted with surrounding ecchymosis. No evidence of infection. Her last tetanus shot was 2009       Assessment & Plan:  Abrasion of abdominal wall, initial encounter  Contusion of abdominal wall, initial encounter I reassured her that she is not in any danger. Recommend supportive care.

## 2016-02-25 ENCOUNTER — Ambulatory Visit: Payer: Managed Care, Other (non HMO) | Admitting: Family Medicine

## 2016-03-29 ENCOUNTER — Ambulatory Visit (INDEPENDENT_AMBULATORY_CARE_PROVIDER_SITE_OTHER): Payer: Managed Care, Other (non HMO) | Admitting: Family Medicine

## 2016-03-29 ENCOUNTER — Encounter: Payer: Self-pay | Admitting: Family Medicine

## 2016-03-29 VITALS — BP 112/76 | HR 80 | Ht 63.0 in | Wt 204.2 lb

## 2016-03-29 DIAGNOSIS — H0520 Unspecified exophthalmos: Secondary | ICD-10-CM

## 2016-03-29 DIAGNOSIS — E7800 Pure hypercholesterolemia, unspecified: Secondary | ICD-10-CM | POA: Diagnosis not present

## 2016-03-29 DIAGNOSIS — I10 Essential (primary) hypertension: Secondary | ICD-10-CM | POA: Diagnosis not present

## 2016-03-29 DIAGNOSIS — E119 Type 2 diabetes mellitus without complications: Secondary | ICD-10-CM | POA: Diagnosis not present

## 2016-03-29 DIAGNOSIS — E559 Vitamin D deficiency, unspecified: Secondary | ICD-10-CM

## 2016-03-29 LAB — POCT GLYCOSYLATED HEMOGLOBIN (HGB A1C): Hemoglobin A1C: 8

## 2016-03-29 MED ORDER — LINAGLIPTIN 5 MG PO TABS: 5.0000 mg | ORAL_TABLET | Freq: Every day | ORAL | 0 refills | Status: DC

## 2016-03-29 NOTE — Progress Notes (Signed)
Chief Complaint  Patient presents with  . Hypertension    nonfasting med check. Eye doctor told her to have her TSH checked today.   . Flu Vaccine    declined.   She reports that her right eye "popped out of its socket" while she was washing her face in the shower.  She had to pop it back in, it was painful.  She saw the eye doctor, checked for any damage (none was seen) and was told to get her thyroid check.  Her last TSH was 0.838 in December 2016.  Denies changes to hair/skin/energy/bowels (always messed up). She does report sweating a lot, daytime and nighttime (but not drenching the sheets).  Diabetes:  Sugars have been running 120-130, 150 later in the day (before eating); doesn't check post-prandially.  She continues to take 25m of prednisone daily, and will likely be on this long-term. She is taking metformin--2 weeks ago she cut back to taking just 1 pill/day due to diarrhea, and felt "sickly"--nauseated.  Felt better after cutting back. Recalls having diarrhea from onglyza in the past as well.  Hypertension:  Reports compliance with medications, no side effects, headaches, dizziness, edema.  Hypercholesterolemia:She is compliant with following lowfat, low cholesterol diet.  Compliant with taking atorvastatin and denies side effects. Last check 4 months ago she had been off statin for a couple of months, and LDL was up at 108. She is not fasting today.  Vitamin D deficiency--last check was low at 26 (11/26/15). She said she is getting prescription of vitamin D, 1000IU, clear pills, but came in a prescription bottle. (had been advised to restart 1000-2000 IU through MyChart with her results). [facility].  She brought it up on her phone, and I saw lab results from July including normal CBC and Vitamin D was also 26.  The physician comment was to continue the weekly prescription.  She reports rx is  clear, daily. (didn't bring her meds with her).  She continues to take iron tablets, despite being clearly told to STOP the iron (ferritin level was 88) in May.  She no longer has black stool, but still has some constipation. She denies any bleeding.  She no longer knows her MyChart log-in infomration for [facility], only [facility]  Other complaints-- Always sweating Also reports incontinence at night.  No urinary symptoms during the day--no leakage, no burning. Thinks ongoing since May, not any worse. No dysuria, hematuria, pelvic pain, vaginal discharge, fever, flank pain. Denies drinking a lot of fluids in the evenings.  PMH, PSH, SH reviewed  Outpatient Encounter Prescriptions as of 03/29/2016  Medication Sig Note  . amLODipine (NORVASC) 5 MG tablet Take 1 tablet (5 mg total) by mouth daily.   .Marland Kitchenatorvastatin (LIPITOR) 20 MG tablet Take 1 tablet (20 mg total) by mouth daily.   . cholecalciferol (VITAMIN D) 1000 units tablet Take 1,000 Units by mouth daily. 03/29/2016: She says these are prescription  . ferrous sulfate 325 (65 FE) MG tablet Take 325 mg by mouth daily with breakfast.   . hydrochlorothiazide (HYDRODIURIL) 25 MG tablet Take 25 mg by mouth daily.   . metFORMIN (GLUCOPHAGE) 1000 MG tablet Take 1 tablet (1,000 mg total) by mouth 2 (two) times daily with a meal. 03/29/2016: Taking 1006monce daily  . mycophenolate (CELLCEPT) 500 MG tablet Take 500 mg by mouth 2 (two) times daily. Reported on 11/26/2015  03/29/2016: Taking on and off-when she gets cold she was told to stop and when feels better to resume  . predniSONE (DELTASONE) 10 MG tablet Take 10 mg by mouth daily with breakfast.   . [DISCONTINUED] predniSONE (STERAPRED UNI-PAK 21 TAB) 5 MG (21) TBPK tablet Take 5 mg by mouth daily.   Marland Kitchen albuterol (PROVENTIL) (2.5 MG/3ML) 0.083% nebulizer solution Take 2.5 mg by nebulization every 4 (four) hours as needed for wheezing or shortness of breath. Reported on 11/26/2015   . linagliptin (TRADJENTA) 5  MG TABS tablet Take 1 tablet (5 mg total) by mouth daily.    No facility-administered encounter medications on file as of 03/29/2016.    (tradjenta started today, not prior to visit).  Allergies reviewed--see chart   ROS:  No fever, chills, URI symptoms, chest pain, dyspnea, bleeding, bruising, rash, dizziness, headaches, edema.  Diarrhea resolved after cutting back on metformin, now has some diarrhea. Denies rectal bleeding/hemorrhoids or black stools.  See HPI.  PHYSICAL EXAM: BP 112/76 (BP Location: Left Arm, Patient Position: Sitting, Cuff Size: Normal)   Pulse 80   Ht 5' 3"  (1.6 m)   Wt 204 lb 3.2 oz (92.6 kg)   LMP 10/09/2015   BMI 36.17 kg/m   Well appearing, pleasant female in no distress HEENT: no proptosis, symmetric. Conjunctiva and sclera are clear, fundi normal. OP is clear Neck: no lymphadenopathy or mass Heart: regular rate and rhythm Lungs: clear bilaterally Back: no CVA tenderness Abdomen: soft, nontender Extremities: no edema Neuro: alert and oriented, cranial nerves intact, normal strength, gait Psych: normal mood, affect, hygiene and grooming  A1c 8 today (up from 7.2 three months ago).  ASSESSMENT/PLAN:  Type 2 diabetes mellitus without complication, without long-term current use of insulin (HCC) - poorly controlled; on chronic prednisone and pt self decreased metformin dose.  Start tradjenta; cont metformin 1000 and incr to BID if glu still high - Plan: HgB A1c, TSH, linagliptin (TRADJENTA) 5 MG TABS tablet, Hemoglobin A1c, Comprehensive metabolic panel  Essential hypertension, benign - controlled - Plan: Comprehensive metabolic panel  Vitamin D deficiency - still low per last check in July. Increase dose to 2000, bring bottle to next visit - Plan: VITAMIN D 25 Hydroxy (Vit-D Deficiency, Fractures)  Pure hypercholesterolemia - Plan: Lipid panel, Comprehensive metabolic panel  Ocular proptosis - r/o hyperthyroidism; TSH came back borderline so repeat in  3 mos with other labs - Plan: TSH, TSH   3 months--fasting labs--lipids, c-met, Vit D, A1c, TSH    Stop iron  Continue to take Vitamin D 1000-2000 (probably 2000 IU daily would be better if you have been taking 1000).  Continue the lipitor (atorvastatin)--we will recheck your fasting cholesterol in 3 months.  Your diabetes is not well controlled.  Likely the combination of ongoing steroids (prednisone) and cutting back on the dose of the metformin has led to higher sugars. They are likely higher at other times during the day when you aren't checking them--sometimes check your sugar 2 hours after dinner or before bed.  Try and continue to limit your sugars, sweets, carbs.  Start tradjenta for your diabetes. Take it once daily. If sugars are still elevated (in the evenings)--and your diarrhea hasn't recurred, I would like for you to try going back up to the twice daily metformin. Contact us within the next couple of weeks with what your sugars are running and if you are tolerating the tradjenta.    Sweats, incontinence Recommended she also discuss with gyn Limit fluids  in the evenings.

## 2016-03-29 NOTE — Patient Instructions (Addendum)
Your diabetes is not well controlled.  Likely the combination of ongoing steroids (prednisone) and cutting back on the dose of the metformin has led to higher sugars. They are likely higher at other times during the day when you aren't checking them--sometimes check your sugar 2 hours after dinner or before bed.  Try and continue to limit your sugars, sweets, carbs.  Start tradjenta for your diabetes. Take it once daily. If sugars are still elevated (in the evenings)--and your diarrhea hasn't recurred, I would like for you to try going back up to the twice daily metformin. Contact us within the next couple of weeks with what your sugars are running and if you are tolerating the tradjenta.   Stop iron  Continue to take Vitamin D 1000-2000 (probably 2000 IU daily would be better if you have been taking 1000).  Continue the lipitor (atorvastatin)--we will recheck your fasting cholesterol in 3 months.  Return in 3 months for fasting labs, with office visit scheduled a few days later to review and discuss the results.

## 2016-03-30 ENCOUNTER — Encounter: Payer: Self-pay | Admitting: Family Medicine

## 2016-03-30 LAB — TSH: TSH: 0.56 mIU/L

## 2016-05-03 LAB — HM DIABETES EYE EXAM

## 2016-05-25 ENCOUNTER — Encounter: Payer: Self-pay | Admitting: Family Medicine

## 2016-05-25 ENCOUNTER — Ambulatory Visit (INDEPENDENT_AMBULATORY_CARE_PROVIDER_SITE_OTHER): Payer: Managed Care, Other (non HMO) | Admitting: Family Medicine

## 2016-05-25 VITALS — BP 120/88 | HR 85 | Temp 98.7°F | Wt 204.0 lb

## 2016-05-25 DIAGNOSIS — R059 Cough, unspecified: Secondary | ICD-10-CM

## 2016-05-25 DIAGNOSIS — R197 Diarrhea, unspecified: Secondary | ICD-10-CM

## 2016-05-25 DIAGNOSIS — J069 Acute upper respiratory infection, unspecified: Secondary | ICD-10-CM

## 2016-05-25 DIAGNOSIS — R05 Cough: Secondary | ICD-10-CM | POA: Diagnosis not present

## 2016-05-25 MED ORDER — AZITHROMYCIN 250 MG PO TABS
ORAL_TABLET | ORAL | 0 refills | Status: DC
Start: 2016-05-25 — End: 2016-07-27

## 2016-05-25 NOTE — Progress Notes (Signed)
Subjective:    Patient ID: Michelle Mack, female    DOB: [birth year], 50 y.o.   MRN: [id number]  HPI Chief Complaint  Patient presents with  . sick    cold for 2 weeks and diarrhea for 3 weeks. symptoms- coughing, stopped up,    She is here with complaints of a 2 week history of URI symptoms that started with sore throat and progressed with cough that is productive of dark green sputum, right ear pain, nasal congestion, clear-green nasal drainage.  Sates her throat is no longer sore.   Denies fever, chills, sinus pain, wheezing, nausea or vomiting.   Reports history of pneumonia a few months ago. States this completely resolved.  She has an albuterol inhaler at home and states she has not needed to use this.   History of polychondritis. She saw her pulmonologist 3 weeks ago and is scheduled to return in 3 months.  She is taking prednisone daily and has been for at least 3 years. She is taking 10 mg daily currently.   She also complains of a 3 week history of diarrhea 3-4 episodes of a soft brownish stool.  She thinks this is related to metformin or one of her diabetes medications. States she would like to talk about changing her medication. Denies abdominal pain.  Denies changes to her diet.   Past Medical History:  Diagnosis Date  . Anemia    on iron  . Back pain    tx with ibuprofen 800mg   . Bronchitis    treated recently by abx  . Cough    non-productive  . DUB (dysfunctional uterine bleeding)   . Fibroid uterus    GYN--Dr. Rivard  . GERD (gastroesophageal reflux disease)   . Hypertension   . Infertility, female   . Polychondritis   . Recurrent upper respiratory infection (URI)    in October - tx with abx  . Type II or unspecified type diabetes mellitus without mention of complication, not stated as uncontrolled    diagnosed by Dr. Berdine Addison   Past Surgical History:  Procedure Laterality Date  . BRONCHOSCOPY  01/2014   at [facility] expiratory collapse of  trachea and focal narrowing in L mainstem bronchus and LUL  . ENDOMETRIAL VAPORIZATION W/ VERSAPOINT    . MYOMECTOMY  2002      Review of Systems Pertinent positives and negatives in the history of present illness.     Objective:   Physical Exam  Constitutional: She is oriented to person, place, and time. She appears well-developed and well-nourished. She does not have a sickly appearance. No distress.  HENT:  Right Ear: Tympanic membrane and ear canal normal.  Left Ear: Tympanic membrane and ear canal normal.  Nose: Mucosal edema present. Right sinus exhibits no maxillary sinus tenderness and no frontal sinus tenderness. Left sinus exhibits no maxillary sinus tenderness and no frontal sinus tenderness.  Mouth/Throat: Uvula is midline, oropharynx is clear and moist and mucous membranes are normal.  Eyes: Conjunctivae are normal.  Neck: Normal range of motion. Neck supple.  Cardiovascular: Normal rate, regular rhythm and normal heart sounds.   Pulmonary/Chest: Effort normal and breath sounds normal.  Abdominal: Normal appearance and bowel sounds are normal. There is no hepatosplenomegaly. There is no tenderness. There is no rigidity, no rebound, no guarding, no tenderness at McBurney's point and negative Murphy's sign.  Lymphadenopathy:    She has no cervical adenopathy.  Neurological: She is alert and oriented to person, place, and time.  Skin: Skin is warm and dry. No pallor.   BP 120/88   Pulse 85   Temp 98.7 F (37.1 C) (Oral)   Wt 204 lb (92.5 kg)   LMP 10/09/2015   BMI 36.14 kg/m       Assessment & Plan:  Acute URI  Cough  Diarrhea, unspecified type  Discussed that she appears to have an acute URI and based on her history of lung disease I will treat her with an antibiotic. Z-pak sent to pharmacy. She has several allergies to antibiotics but states she can take Z-pak. She will continue with symptomatic treatment. Advised to follow up if not back to baseline after  day 10.  Advised that diarrhea appears to be somewhat chronic based on review of her chart. She agrees that diarrhea is unchanged. Vitals are wnl and her abdominal exam is normal. Reassured that I am not concerned about infectious GI etiology. Recommend that if diarrhea persists she should f/u with PCP. No changes to medication.  Advised to stay well hydrated.

## 2016-05-25 NOTE — Patient Instructions (Addendum)
Follow up with Dr. Tomi Bamberger if diarrhea persists and it appears to be related to your medication.  You can try immodium and see if this helps.  Stay well hydrated.  If you are not back to your baseline as far as upper respiratory symptoms by day 10 (the Z-pak keeps working for 10 days) then call/follow up.  Use the saline nasal spray.     Diarrhea, Adult Diarrhea is frequent loose and watery bowel movements. Diarrhea can make you feel weak and cause you to become dehydrated. Dehydration can make you tired and thirsty, cause you to have a dry mouth, and decrease how often you urinate. Diarrhea typically lasts 2-3 days. However, it can last longer if it is a sign of something more serious. It is important to treat your diarrhea as told by your health care provider. Follow these instructions at home: Eating and drinking Follow these recommendations as told by your health care provider:  Take an oral rehydration solution (ORS). This is a drink that is sold at pharmacies and retail stores.  Drink clear fluids, such as water, ice chips, diluted fruit juice, and low-calorie sports drinks.  Eat bland, easy-to-digest foods in small amounts as you are able. These foods include bananas, applesauce, rice, lean meats, toast, and crackers.  Avoid drinking fluids that contain a lot of sugar or caffeine, such as energy drinks, sports drinks, and soda.  Avoid alcohol.  Avoid spicy or fatty foods. General instructions  Drink enough fluid to keep your urine clear or pale yellow.  Wash your hands often. If soap and water are not available, use hand sanitizer.  Make sure that all people in your household wash their hands well and often.  Take over-the-counter and prescription medicines only as told by your health care provider.  Rest at home while you recover.  Watch your condition for any changes.  Take a warm bath to relieve any burning or pain from frequent diarrhea episodes.  Keep all follow-up  visits as told by your health care provider. This is important. Contact a health care provider if:  You have a fever.  Your diarrhea gets worse.  You have new symptoms.  You cannot keep fluids down.  You feel light-headed or dizzy.  You have a headache  You have muscle cramps. Get help right away if:  You have chest pain.  You feel extremely weak or you faint.  You have bloody or black stools or stools that look like tar.  You have severe pain, cramping, or bloating in your abdomen.  You have trouble breathing or you are breathing very quickly.  Your heart is beating very quickly.  Your skin feels cold and clammy.  You feel confused.  You have signs of dehydration, such as:  Dark urine, very little urine, or no urine.  Cracked lips.  Dry mouth.  Sunken eyes.  Sleepiness.  Weakness. This information is not intended to replace advice given to you by your health care provider. Make sure you discuss any questions you have with your health care provider. Document Released: 06/11/2002 Document Revised: 10/30/2015 Document Reviewed: 02/25/2015 Elsevier Interactive Patient Education  2017 [employer].

## 2016-06-04 ENCOUNTER — Other Ambulatory Visit: Payer: Self-pay | Admitting: Family Medicine

## 2016-06-04 DIAGNOSIS — I10 Essential (primary) hypertension: Secondary | ICD-10-CM

## 2016-06-11 ENCOUNTER — Other Ambulatory Visit: Payer: Self-pay | Admitting: Family Medicine

## 2016-06-11 DIAGNOSIS — E119 Type 2 diabetes mellitus without complications: Secondary | ICD-10-CM

## 2016-06-15 ENCOUNTER — Other Ambulatory Visit: Payer: Self-pay | Admitting: Family Medicine

## 2016-06-15 DIAGNOSIS — E7800 Pure hypercholesterolemia, unspecified: Secondary | ICD-10-CM

## 2016-07-27 ENCOUNTER — Encounter: Payer: Self-pay | Admitting: Medical

## 2016-07-27 ENCOUNTER — Ambulatory Visit (INDEPENDENT_AMBULATORY_CARE_PROVIDER_SITE_OTHER): Payer: Managed Care, Other (non HMO) | Admitting: Medical

## 2016-07-27 VITALS — BP 110/68 | HR 89 | Wt 206.4 lb

## 2016-07-27 DIAGNOSIS — R3915 Urgency of urination: Secondary | ICD-10-CM

## 2016-07-27 DIAGNOSIS — M545 Low back pain, unspecified: Secondary | ICD-10-CM

## 2016-07-27 DIAGNOSIS — R319 Hematuria, unspecified: Secondary | ICD-10-CM

## 2016-07-27 LAB — POCT URINALYSIS DIPSTICK
BILIRUBIN UA: NEGATIVE
Glucose, UA: NEGATIVE
Ketones, UA: NEGATIVE
LEUKOCYTES UA: NEGATIVE
NITRITE UA: NEGATIVE
PH UA: 6
Protein, UA: NEGATIVE
Spec Grav, UA: >=1.03
Urobilinogen, UA: NEGATIVE

## 2016-07-27 MED ORDER — CYCLOBENZAPRINE HCL 10 MG PO TABS: ORAL_TABLET | ORAL | 0 refills | Status: DC

## 2016-07-27 NOTE — Progress Notes (Signed)
Subjective: Chief Complaint  Patient presents with  . lower back    lower back pain, pressure when she goes to nthe bathroom x 2 week s   Here for back pain.  Has had several days of left low back pain in general, but also gets mid low back pain with bowel movement sometimes.    When using bathroom, getting sharp pains in middle of back.  Has had stronger force of urine lately.  But no urine odor, no visible hematuria, no vaginal symptoms, no fever, no weight loss, no night sweats.   Has had some urinary pressure and urgency.   Saw OB/Gyn 2 weeks ago for yeast infection and reportedly no concerns on pelvic exam.   She finish the antifungal.   She notes daily soft brown BM.  No diarrhea, no nausea, no vomiting.   But has occasional loose stool last few days.   No recent antibiotic use.   No sick contacts.   No recent travel, no blood in stool.  No pain radiating from back to legs, no paresthesias of legs.   No knee or hip pain. No joint swelling.  Not exercising regularly. No other aggravating or relieving factors. No other complaint.  Past Medical History:  Diagnosis Date  . Anemia    on iron  . Back pain    tx with ibuprofen 800mg   . Bronchitis    treated recently by abx  . Cough    non-productive  . DUB (dysfunctional uterine bleeding)   . Fibroid uterus    GYN--Dr. Rivard  . GERD (gastroesophageal reflux disease)   . Hypertension   . Infertility, female   . Polychondritis   . Recurrent upper respiratory infection (URI)    in October - tx with abx  . Type II or unspecified type diabetes mellitus without mention of complication, not stated as uncontrolled    diagnosed by Dr. Berdine Addison   Current Outpatient Prescriptions on File Prior to Visit  Medication Sig Dispense Refill  . amLODipine (NORVASC) 5 MG tablet TAKE 1 TABLET EVERY DAY 90 tablet 1  . atorvastatin (LIPITOR) 20 MG tablet TAKE 1 TABLET (20 MG TOTAL) BY MOUTH DAILY. 90 tablet 1  . cholecalciferol (VITAMIN D) 1000 units tablet  Take 1,000 Units by mouth daily.    . hydrochlorothiazide (HYDRODIURIL) 25 MG tablet Take 25 mg by mouth daily.  3  . metFORMIN (GLUCOPHAGE) 1000 MG tablet TAKE 1 TABLET TWICE A DAY WITH A MEAL 180 tablet 0  . predniSONE (DELTASONE) 10 MG tablet Take 10 mg by mouth daily with breakfast.    . albuterol (PROVENTIL) (2.5 MG/3ML) 0.083% nebulizer solution Take 2.5 mg by nebulization every 4 (four) hours as needed for wheezing or shortness of breath. Reported on 11/26/2015    . linagliptin (TRADJENTA) 5 MG TABS tablet Take 1 tablet (5 mg total) by mouth daily. (Patient not taking: Reported on 07/27/2016) 28 tablet 0   No current facility-administered medications on file prior to visit.    Past Surgical History:  Procedure Laterality Date  . BRONCHOSCOPY  01/2014   at [facility] expiratory collapse of trachea and focal narrowing in L mainstem bronchus and LUL  . ENDOMETRIAL VAPORIZATION W/ VERSAPOINT    . MYOMECTOMY  2002     ROS as in subjective   Objective: BP 110/68   Pulse 89   Wt 206 lb 6.4 oz (93.6 kg)   LMP 10/09/2015   SpO2 98%   BMI 36.56 kg/m   General  appearance: alert, no distress, WD/WN Heart: RRR, normal S1, S2, no murmurs Lungs: CTA bilaterally, no wheezes, rhonchi, or rales Abdomen: +bs, soft, non tender, non distended, no masses, no hepatomegaly, no splenomegaly Back: non tender, normal ROM, no deformity Musculoskeletal: legs nontender, no swelling, no obvious deformity Extremities: no edema, no cyanosis, no clubbing Pulses: 2+ symmetric, upper and lower extremities, normal cap refill Neurological: alert, oriented x 3, CN2-12 intact, strength normal upper extremities and lower extremities, sensation normal throughout, DTRs 2+ throughout, no cerebellar signs, gait normal Psychiatric: normal affect, behavior normal, pleasant     Assessment: Encounter Diagnoses  Name Primary?  . Left-sided low back pain without sciatica, unspecified chronicity Yes  . Urinary  urgency   . Hematuria, unspecified type     Plan: Etiology unclear, but I suspect musculoskeletal back pain.  Will send urine for culture.  She has hx/o chronic microscopic hematuria with reported prior urology eval.   Exam is unremarkable.  Advised few days of OTC NSAID, advised she start using exercise and stretching regimen daily.  Can use short term flexeril QHS prn.   Consider xray of lumbar spine if worsening pain.   reviewed most recent visit notes and labs in chart from diabetes f/u.     Makailee was seen today for lower back.  Diagnoses and all orders for this visit:  Left-sided low back pain without sciatica, unspecified chronicity  Urinary urgency -     Urine culture  Hematuria, unspecified type -     Urinalysis Dipstick -     POCT UA - Microscopic Only -     Urine culture  Other orders -     cyclobenzaprine (FLEXERIL) 10 MG tablet; Take 1 tablet po QHS prn

## 2016-07-28 LAB — POCT UA - MICROSCOPIC ONLY

## 2016-07-28 LAB — URINE CULTURE: ORGANISM ID, BACTERIA: NO GROWTH

## 2016-08-11 ENCOUNTER — Telehealth: Payer: Self-pay | Admitting: Family Medicine

## 2016-08-11 NOTE — Telephone Encounter (Signed)
Scheduled for tomorrow morning at 8:45am.

## 2016-08-11 NOTE — Telephone Encounter (Signed)
Pt called and states cut on foot will not heal.  She had Athletes foot and has a cut between her toes that will not heal. Do you want her to come here or go to foot dr?  Pt [phone]  Pt said not infected but can see meat underneath it

## 2016-08-11 NOTE — Telephone Encounter (Signed)
I'd be happy to see her here. Please schedule

## 2016-08-12 ENCOUNTER — Encounter: Payer: Self-pay | Admitting: Family Medicine

## 2016-08-12 ENCOUNTER — Ambulatory Visit (INDEPENDENT_AMBULATORY_CARE_PROVIDER_SITE_OTHER): Payer: Managed Care, Other (non HMO) | Admitting: Family Medicine

## 2016-08-12 VITALS — BP 120/64 | HR 68 | Ht 63.0 in | Wt 204.4 lb

## 2016-08-12 DIAGNOSIS — B351 Tinea unguium: Secondary | ICD-10-CM

## 2016-08-12 DIAGNOSIS — B353 Tinea pedis: Secondary | ICD-10-CM

## 2016-08-12 NOTE — Progress Notes (Signed)
Chief Complaint  Patient presents with  . Foot issue    x 2 weeks on right foot. Thought she had athlete's foot, used OTC spray and thinks it made it worse. Stopped using spray x 2 days and actually has improved.    She noticed some burning and itching on her right foot two weeks ago. She started using an OTC athlete's foot spray once daily, and itching and burning resolved. She still has a blister that hasn't healed (on the 4th toe). Denies any pain, oozing, drainage, crusting.  The blistered area seemed red/inflamed when using the spray, so she stopped using the spray 2 days ago and it seems better.  She reports her sugars have been running in the 120's in the morning, not seeing any high values.  She overall is feeling well.  PMH, PSH, SH reviewed  Outpatient Encounter Prescriptions as of 08/12/2016  Medication Sig Note  . amLODipine (NORVASC) 5 MG tablet TAKE 1 TABLET EVERY DAY   . atorvastatin (LIPITOR) 20 MG tablet TAKE 1 TABLET (20 MG TOTAL) BY MOUTH DAILY.   . cholecalciferol (VITAMIN D) 1000 units tablet Take 1,000 Units by mouth daily. 03/29/2016: She says these are prescription  . hydrochlorothiazide (HYDRODIURIL) 25 MG tablet Take 25 mg by mouth daily.   . metFORMIN (GLUCOPHAGE) 1000 MG tablet TAKE 1 TABLET TWICE A DAY WITH A MEAL   . predniSONE (DELTASONE) 10 MG tablet Take 10 mg by mouth daily with breakfast.   . albuterol (PROVENTIL) (2.5 MG/3ML) 0.083% nebulizer solution Take 2.5 mg by nebulization every 4 (four) hours as needed for wheezing or shortness of breath. Reported on 11/26/2015   . cyclobenzaprine (FLEXERIL) 10 MG tablet Take 1 tablet po QHS prn (Patient not taking: Reported on 08/12/2016)   . linagliptin (TRADJENTA) 5 MG TABS tablet Take 1 tablet (5 mg total) by mouth daily. (Patient not taking: Reported on 07/27/2016)    No facility-administered encounter medications on file as of 08/12/2016.    Allergies:  PCN, cephalosporins, nitrofurantoin, sulfa  ROS:  Denies fever,  chills, URI symptoms, shortness of breath, GI complaints.  No other skin concerns.  Burning/itching of feet has resolved. See HPI  PHYSICAL EXAM:  BP 120/64 (BP Location: Left Arm, Patient Position: Sitting, Cuff Size: Normal)   Pulse 68   Ht 5\' 3"  (1.6 m)   Wt 204 lb 6.4 oz (92.7 kg)   LMP 10/09/2015   BMI 36.21 kg/m   Well developed, pleasant, overweight female in no distress  Right foot  Between the 4th and 5th toes is slightly white--not macerated (she reports it was), no flaking.  Appears to be healed from recent tinea infection.  There is what appears to be a healing fissure (blister per history) at the bottom of the 4th toe.  There is no erythema, drainage, swelling.  It is nontender.  Any erythema, "beefy red" that was seen by the patient 2 days ago has resolved.  Discoloration and thickening of the 2nd and 5th toes. Toenails are long Slight callous formation on the top of the 2nd toe.  2+ pulses and normal sensation.  ASSESSMENT/PLAN:  Tinea pedis of right foot - resolved wth OTC treatment; discussed preventative  measures, causes. DM is controlled  Onychomycosis - asymptomatic. Treatment not recommended--recommended keeping nails trimmed straight across.    Keep the feet very dry.  Wear cotton socks rather than Nylon.  Don't wear closed shoes without socks.  Consider using powder (corn starch or medicated) to help keep  the feet/toes dry.  At this point it looks like the athlete foot has resolved with the antifungal treatment you used.  At this point you don't need to continue antifungal treatment. Just apply some antibacterial ointment to the small area on the bottom of the 4th toe.   Restart antifungal medication only if itching/burning/flaking, white mushy (macerated) areas recur.  Keep your nails trimmed, straight across. If you have any trouble cutting your nails (remember to soak them first to make them softer), and would like to see a podiatrist, I recommend the  [facility]. Let us know if a referral is needed.

## 2016-08-12 NOTE — Patient Instructions (Signed)
Keep the feet very dry.  Wear cotton socks rather than Nylon.  Don't wear closed shoes without socks.  Consider using powder (corn starch or medicated) to help keep the feet/toes dry.  At this point it looks like the athlete foot has resolved with the antifungal treatment you used.  At this point you don't need to continue antifungal treatment. Just apply some antibacterial ointment to the small area on the bottom of the 4th toe.   Restart antifungal medication only if itching/burning/flaking, white mushy (macerated) areas recur.  Keep your nails trimmed, straight across. If you have any trouble cutting your nails (remember to soak them first to make them softer), and would like to see a podiatrist, I recommend the [facility]. Let us know if a referral is needed.     Athlete's Foot Introduction Athlete's foot (tinea pedis) is a fungal infection of the skin on the feet. It often occurs on the skin that is between or underneath the toes. It can also occur on the soles of the feet. The infection can spread from person to person (is contagious). What are the causes? Athlete's foot is caused by a fungus. This fungus grows in warm, moist places. Most people get athlete's foot by sharing shower stalls, towels, and wet floors with someone who is infected. Not washing your feet or changing your socks often enough can contribute to athlete's foot. What increases the risk? This condition is more likely to develop in:  Men.  People who have a weak body defense system (immune system).  People who have diabetes.  People who use public showers, such as at a gym.  People who wear heavy-duty shoes, such as Environmental manager.  Seasons with warm, humid weather. What are the signs or symptoms? Symptoms of this condition include:  Itchy areas between the toes or on the soles of the feet.  White, flaky, or scaly areas between the toes or on the soles of the feet.  Very itchy  small blisters between the toes or on the soles of the feet.  Small cuts on the skin. These cuts can become infected.  Thick or discolored toenails. How is this diagnosed? This condition is diagnosed with a medical history and physical exam. Your health care provider may also take a skin or toenail sample to be examined. How is this treated? Treatment for this condition includes antifungal medicines. These may be applied as powders, ointments, or creams. In severe cases, an oral antifungal medicine may be given. Follow these instructions at home:  Apply or take over-the-counter and prescription medicines only as told by your health care provider.  Keep all follow-up visits as told by your health care provider. This is important.  Do not scratch your feet.  Keep your feet dry:  Wear cotton or wool socks. Change your socks every day or if they become wet.  Wear shoes that allow air to circulate, such as sandals or canvas tennis shoes.  Wash and dry your feet:  Every day or as told by your health care provider.  After exercising.  Including the area between your toes.  Do not share towels, nail clippers, or other personal items that touch your feet with others.  If you have diabetes, keep your blood sugar under control. How is this prevented?  Do not share towels.  Wear sandals in wet areas, such as locker rooms and shared showers.  Keep your feet dry:  Wear cotton or wool socks. Change  your socks every day or if they become wet.  Wear shoes that allow air to circulate, such as sandals or canvas tennis shoes.  Wash and dry your feet after exercising. Pay attention to the area between your toes. Contact a health care provider if:  You have a fever.  You have swelling, soreness, warmth, or redness in your foot.  You are not getting better with treatment.  Your symptoms get worse.  You have new symptoms. This information is not intended to replace advice given to  you by your health care provider. Make sure you discuss any questions you have with your health care provider. Document Released: 06/18/2000 Document Revised: 11/27/2015 Document Reviewed: 12/23/2014  2017 Elsevier   Fungal Nail Infection Introduction Fungal nail infection is a common fungal infection of the toenails or fingernails. This condition affects toenails more often than fingernails. More than one nail may be infected. The condition can be passed from person to person (is contagious). What are the causes? This condition is caused by a fungus. Several types of funguses can cause the infection. These funguses are common in moist and warm areas. If your hands or feet come into contact with the fungus, it may get into a crack in your fingernail or toenail and cause the infection. What increases the risk? The following factors may make you more likely to develop this condition:  Being female.  Having diabetes.  Being of older age.  Living with someone who has the fungus.  Walking barefoot in areas where the fungus thrives, such as showers or locker rooms.  Having poor circulation.  Wearing shoes and socks that cause your feet to sweat.  Having athlete's foot.  Having a nail injury or history of a recent nail surgery.  Having psoriasis.  Having a weak body defense system (immune system). What are the signs or symptoms? Symptoms of this condition include:  A pale spot on the nail.  Thickening of the nail.  A nail that becomes yellow or brown.  A brittle or ragged nail edge.  A crumbling nail.  A nail that has lifted away from the nail bed. How is this diagnosed? This condition is diagnosed with a physical exam. Your health care provider may take a scraping or clipping from your nail to test for the fungus. How is this treated? Mild infections do not need treatment. If you have significant nail changes, treatment may include:  Oral antifungal medicines. You may  need to take the medicine for several weeks or several months, and you may not see the results for a long time. These medicines can cause side effects. Ask your health care provider what problems to watch for.  Antifungal nail polish and nail cream. These may be used along with oral antifungal medicines.  Laser treatment of the nail.  Surgery to remove the nail. This may be needed for the most severe infections. Treatment takes a long time, and the infection may come back. Follow these instructions at home: Medicines  Take or apply over-the-counter and prescription medicines only as told by your health care provider.  Ask your health care provider about using over-the-counter mentholated ointment on your nails. Lifestyle  Do not share personal items, such as towels or nail clippers.  Trim your nails often.  Wash and dry your hands and feet every day.  Wear absorbent socks, and change your socks frequently.  Wear shoes that allow air to circulate, such as sandals or canvas tennis shoes. Throw out old  shoes.  Wear rubber gloves if you are working with your hands in wet areas.  Do not walk barefoot in shower rooms or locker rooms.  Do not use a nail salon that does not use clean instruments.  Do not use artificial nails. General instructions  Keep all follow-up visits as told by your health care provider. This is important.  Use antifungal foot powder on your feet and in your shoes. Contact a health care provider if: Your infection is not getting better or it is getting worse after several months. This information is not intended to replace advice given to you by your health care provider. Make sure you discuss any questions you have with your health care provider. Document Released: 06/18/2000 Document Revised: 11/27/2015 Document Reviewed: 12/23/2014  2017 [employer]vier

## 2016-08-18 ENCOUNTER — Ambulatory Visit (INDEPENDENT_AMBULATORY_CARE_PROVIDER_SITE_OTHER): Payer: Managed Care, Other (non HMO) | Admitting: Family Medicine

## 2016-08-18 ENCOUNTER — Encounter: Payer: Self-pay | Admitting: Family Medicine

## 2016-08-18 VITALS — BP 122/74 | HR 80 | Ht 63.0 in | Wt 206.2 lb

## 2016-08-18 DIAGNOSIS — E119 Type 2 diabetes mellitus without complications: Secondary | ICD-10-CM | POA: Diagnosis not present

## 2016-08-18 DIAGNOSIS — M791 Myalgia, unspecified site: Secondary | ICD-10-CM

## 2016-08-18 DIAGNOSIS — M79602 Pain in left arm: Secondary | ICD-10-CM | POA: Diagnosis not present

## 2016-08-18 DIAGNOSIS — R079 Chest pain, unspecified: Secondary | ICD-10-CM | POA: Diagnosis not present

## 2016-08-18 LAB — POCT GLYCOSYLATED HEMOGLOBIN (HGB A1C): Hemoglobin A1C: 8.7

## 2016-08-18 NOTE — Patient Instructions (Addendum)
  I recommend that you try warm compresses to the sore muscles. You can also try topical medications such as icy hot or biofreeze. You can try ibuprofen 600mg  three times daily for 1 week (vs aleve twice daily for a week).  Stop these if it bothers your stomach. Your EKG looked good.  You pain clearly seems to be muscular, not cardiac.  Please start taking the Tradjenta samples you were given at last visit. It is one pill daily. Continue the metformin 1000mg  once a day for now.  Continue to check your blood sugars, and if they remain >150-160 in the mornings, then try increasing the metformin dose again--you can start by taking an extra 1/2 tablet with dinner (rather going back up to two full pills daily, which bothered your stomach.  Return for fasting labs and med check in the next 2 weeks.  Bring your list of sugars to your appointment.

## 2016-08-18 NOTE — Progress Notes (Signed)
Chief Complaint  Patient presents with  . Arm Pain    left arm pain that radiated to her chest. Sharp pains. No SOB. Rheum gave her cortisone shot in that shoulder Monday that did not help at all for the pain. She spoke to them today and they told her to see her PCP and make sure its not cardiac.   Patient presents with complaint of 2-3 weeks of pain in her left arm. It started at the neck, radiated down to the shoulder and into her arm. She had a cortisone shot to her left shoulder 2 days ago at her rheumatology visit. The shoulder no longer hurts, and the neck is improved, but she still has pain in the upper and lower left arm.  Denies numbness, tingling. She has trouble lifting things with the left arm due to weakness. She also has pain when trying to lift.  She has "coming and going" sharp pains in the chest.  Not related to activity, eating, short-lived.  Denies heartburn.  She stopped prednisone 2 days ago (had tapered down, and stopped at her last visit).  Breathing remains fine.  Denies any change in activity or injury.  Diabetes--last seen 03/2016 for this. A1c was 8.0.  She wasn't tolerating BID metformin, so had cut back to just 1000mg  once daily.  She was supposed to start tradjenta 5mg  once daily, and f/u in 3 months.  Future orders are in system.  Appointment was never made. She admits she never took the tradjenta--she was given 4 weeks of samples at the visit. She didn't realize she was supposed to start these.  PMH, PSH, SH reviewed  Outpatient Encounter Prescriptions as of 08/18/2016  Medication Sig Note  . amLODipine (NORVASC) 5 MG tablet TAKE 1 TABLET EVERY DAY   . atorvastatin (LIPITOR) 20 MG tablet TAKE 1 TABLET (20 MG TOTAL) BY MOUTH DAILY.   . cholecalciferol (VITAMIN D) 1000 units tablet Take 1,000 Units by mouth daily. 03/29/2016: She says these are prescription  . hydrochlorothiazide (HYDRODIURIL) 25 MG tablet Take 25 mg by mouth daily.   . metFORMIN (GLUCOPHAGE) 1000  MG tablet TAKE 1 TABLET TWICE A DAY WITH A MEAL   . albuterol (PROVENTIL) (2.5 MG/3ML) 0.083% nebulizer solution Take 2.5 mg by nebulization every 4 (four) hours as needed for wheezing or shortness of breath. Reported on 11/26/2015   . cyclobenzaprine (FLEXERIL) 10 MG tablet Take 1 tablet po QHS prn (Patient not taking: Reported on 08/12/2016)   . linagliptin (TRADJENTA) 5 MG TABS tablet Take 1 tablet (5 mg total) by mouth daily. (Patient not taking: Reported on 07/27/2016)   . predniSONE (DELTASONE) 10 MG tablet Take 10 mg by mouth daily with breakfast. 08/18/2016: Last dose 2 days ago, stopped by rheum   No facility-administered encounter medications on file as of 08/18/2016.    Allergies reviewed.  ROS: Denies fever, chills, URI symptoms. Breathing is stable, no shortness of breath. No nausea, vomiting, diarrhea. +chest pain and left arm pain as per HPI. Denies urinary complaints, headache, dizziness or other concerns.   PHYSICAL EXAM:  BP 122/74 (BP Location: Left Arm, Patient Position: Sitting, Cuff Size: Normal)   Pulse 80   Ht 5\' 3"  (1.6 m)   Wt 206 lb 3.2 oz (93.5 kg)   LMP 10/09/2015   BMI 36.53 kg/m    Well appearing, pleasant female in no distress Heart: regular rate and rhythm Chest wall nontender Lungs: clear bilaterally Extremities: no edema, normal pulses  Tender to palpation  over the left bicep muscle, as well as both forearm muscles.  Pain with bicep contraction and extension of the wrist against resistance.  No weakness noted--normal strength; normal sensation DTR's 2+ and symmetric Normal pulses. Neck: no c-spine tenderness, muscle spasm or tenderness  Skin: normal turgor, no rash/lesions  EKG:  NSR, rate 78.  Normal study.  Slight change in RWP, later transition compared to 08/2014 study (also some changes in avF and III compared to prior study)--today's study looks more similar to 08/2013 and 11/2013 EKG's.  Labs done 2/12 from [facility]  U/a with mod blood Cr  0.95 CBC normal   Lab Results  Component Value Date   HGBA1C 8.7 08/18/2016   ASSESSMENT/PLAN:  Chest pain, unspecified type - reassured this doesn't appear to be cardiac - Plan: EKG 12-Lead  Type 2 diabetes mellitus without complication, without long-term current use of insulin (HCC) - uncontrolled. noncompliant with med/f/u--start tradjenta, f/b increasing metformin to 1000/500 if tolerated. f/u 1-2 weeks with list of BPs - Plan: HgB A1c  Left arm pain - muscular.  Unknown etiology, occurred prior to stopping prednisone. Advil prn, heat, topical meds. f/u rheum if worse  Myalgia   Past due for diabetes med check Noncompliant with diabetes care (never started tradjenta).  Return for fasting labs (minus A1c, done today)   I recommend that you try warm compresses to the sore muscles. You can also try topical medications such as icy hot or biofreeze. You can try ibuprofen 600mg  three times daily for 1 week (vs aleve twice daily for a week).  Stop these if it bothers your stomach. Your EKG looked good.  You pain clearly seems to be muscular, not cardiac.

## 2016-08-23 ENCOUNTER — Other Ambulatory Visit: Payer: 59

## 2016-08-23 DIAGNOSIS — I10 Essential (primary) hypertension: Secondary | ICD-10-CM

## 2016-08-23 DIAGNOSIS — E119 Type 2 diabetes mellitus without complications: Secondary | ICD-10-CM

## 2016-08-23 DIAGNOSIS — E7800 Pure hypercholesterolemia, unspecified: Secondary | ICD-10-CM

## 2016-08-23 DIAGNOSIS — E559 Vitamin D deficiency, unspecified: Secondary | ICD-10-CM

## 2016-08-23 DIAGNOSIS — H0520 Unspecified exophthalmos: Secondary | ICD-10-CM

## 2016-08-23 LAB — COMPREHENSIVE METABOLIC PANEL
ALT: 21 U/L (ref 6–29)
AST: 17 U/L (ref 10–35)
Albumin: 4.4 g/dL (ref 3.6–5.1)
Alkaline Phosphatase: 78 U/L (ref 33–115)
BUN: 16 mg/dL (ref 7–25)
CHLORIDE: 102 mmol/L (ref 98–110)
CO2: 30 mmol/L (ref 20–31)
CREATININE: 1.06 mg/dL (ref 0.50–1.10)
Calcium: 10.2 mg/dL (ref 8.6–10.2)
Glucose, Bld: 137 mg/dL — ABNORMAL HIGH (ref 65–99)
Potassium: 3.5 mmol/L (ref 3.5–5.3)
SODIUM: 141 mmol/L (ref 135–146)
Total Bilirubin: 1.3 mg/dL — ABNORMAL HIGH (ref 0.2–1.2)
Total Protein: 7.6 g/dL (ref 6.1–8.1)

## 2016-08-23 LAB — LIPID PANEL
CHOL/HDL RATIO: 2.4 ratio (ref <5.0)
Cholesterol: 117 mg/dL (ref <200)
HDL: 49 mg/dL — AB (ref >50)
LDL CALC: 44 mg/dL (ref <100)
Triglycerides: 122 mg/dL (ref <150)
VLDL: 24 mg/dL (ref <30)

## 2016-08-23 LAB — TSH: TSH: 1.13 mIU/L

## 2016-08-24 LAB — VITAMIN D 25 HYDROXY (VIT D DEFICIENCY, FRACTURES): Vit D, 25-Hydroxy: 39 ng/mL (ref 30–100)

## 2016-08-26 ENCOUNTER — Telehealth: Payer: Self-pay | Admitting: *Deleted

## 2016-08-26 MED ORDER — IBUPROFEN 600 MG PO TABS: 600.0000 mg | ORAL_TABLET | Freq: Three times a day (TID) | ORAL | 0 refills | Status: DC

## 2016-08-26 NOTE — Telephone Encounter (Signed)
Patient advised and rx sent. 

## 2016-08-26 NOTE — Telephone Encounter (Signed)
We discussed OTC meds such as ibuprofen (up to 600mg  TID x 7 days) vs Aleve.  If she wants rx, okay for #21 only.  (vs her taking 3 OTC advil/ibuprofen together).  She needs to cut back on the dose if it bothers her stomach, needs to take it with food.  And if her rheumatologist or other doctors put her back on prednisone, then she needs to stop it (not intended for her to take along with prednisone--at her recent visit she had just stopped taking it).

## 2016-08-26 NOTE — Addendum Note (Signed)
Addended by: Carolee Rota F on: 08/26/2016 05:17 PM   Modules accepted: Orders

## 2016-08-26 NOTE — Telephone Encounter (Signed)
Patient would like to know if you would call her in 600mg  of IBU for her arm, you told her take it at her last visit. [facility].

## 2016-10-02 ENCOUNTER — Other Ambulatory Visit: Payer: Self-pay | Admitting: Family Medicine

## 2016-10-02 DIAGNOSIS — E119 Type 2 diabetes mellitus without complications: Secondary | ICD-10-CM

## 2016-10-12 ENCOUNTER — Ambulatory Visit (INDEPENDENT_AMBULATORY_CARE_PROVIDER_SITE_OTHER): Payer: 59 | Admitting: Sports Medicine

## 2016-10-12 ENCOUNTER — Encounter: Payer: Self-pay | Admitting: Sports Medicine

## 2016-10-12 ENCOUNTER — Ambulatory Visit (INDEPENDENT_AMBULATORY_CARE_PROVIDER_SITE_OTHER): Payer: 59

## 2016-10-12 DIAGNOSIS — M21619 Bunion of unspecified foot: Secondary | ICD-10-CM | POA: Diagnosis not present

## 2016-10-12 DIAGNOSIS — M79671 Pain in right foot: Secondary | ICD-10-CM

## 2016-10-12 DIAGNOSIS — M779 Enthesopathy, unspecified: Secondary | ICD-10-CM | POA: Diagnosis not present

## 2016-10-12 DIAGNOSIS — M79672 Pain in left foot: Secondary | ICD-10-CM

## 2016-10-12 MED ORDER — MELOXICAM 15 MG PO TABS: 15.0000 mg | ORAL_TABLET | Freq: Every day | ORAL | 0 refills | Status: DC

## 2016-10-12 NOTE — Progress Notes (Signed)
Subjective: Michelle Mack is a 51 y.o. female patient who presents to office for evaluation of Bilateral bunion pain. Patient complains of progressive pain especially over the last month that starts as pain over the bump with direct pressure and range of motion; patient now has difficulty fitting shoes comfortably. Patient has also tried nothing. Patient denies any other pedal complaints.   Patient Active Problem List   Diagnosis Date Noted  . 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 Fibroid, uterine 11/13/2015  . Mediastinal adenopathy 10/23/2015  . Collapse of left lung 10/23/2015  . Relapsing polychondritis 10/23/2015  . Type 2 diabetes mellitus, controlled, with renal complications ([place]) 05/01/2535  . CKD (chronic kidney disease) stage 2, GFR 60-89 ml/min 11/06/2014  . Type 2 diabetes mellitus without complication ([place]) 64/40/3474  . Essential hypertension 05/22/2014  . Chronic polychondritis 05/02/2014  . Dysfunctional uterine bleeding 04/29/2014  . Obstructive apnea 04/29/2014  . Other specified respiratory disorders 04/29/2014  . Microscopic hematuria 04/12/2014  . 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 Positive ANA (antinuclear antibody) 02/26/2014  . Vitamin D deficiency 07/31/2013  . Obesity (BMI 30-39.9)   . Impaired fasting glucose 05/24/2012  . Fibroid uterus 05/24/2012  . Infertility, female   . DUB (dysfunctional uterine bleeding)   . Chronic cough 05/25/2011  . GERD (gastroesophageal reflux disease) 05/25/2011  . Anemia 05/25/2011  . Essential hypertension, benign 04/05/2011    Current Outpatient Prescriptions on File Prior to Visit  Medication Sig Dispense Refill  . albuterol (PROVENTIL) (2.5 MG/3ML) 0.083% nebulizer solution Take 2.5 mg by nebulization every 4 (four) hours as needed for wheezing or shortness of breath. Reported on 11/26/2015    . amLODipine (NORVASC) 5 MG tablet TAKE 1 TABLET EVERY DAY 90 tablet 1  . atorvastatin (LIPITOR) 20 MG tablet TAKE 1 TABLET (20 MG  TOTAL) BY MOUTH DAILY. 90 tablet 1  . cholecalciferol (VITAMIN D) 1000 units tablet Take 1,000 Units by mouth daily.    . cyclobenzaprine (FLEXERIL) 10 MG tablet Take 1 tablet po QHS prn (Patient not taking: Reported on 08/12/2016) 15 tablet 0  . hydrochlorothiazide (HYDRODIURIL) 25 MG tablet Take 25 mg by mouth daily.  3  . ibuprofen (ADVIL,MOTRIN) 600 MG tablet Take 1 tablet (600 mg total) by mouth 3 (three) times daily. 21 tablet 0  . linagliptin (TRADJENTA) 5 MG TABS tablet Take 1 tablet (5 mg total) by mouth daily. (Patient not taking: Reported on 07/27/2016) 28 tablet 0  . metFORMIN (GLUCOPHAGE) 1000 MG tablet TAKE 1 TABLET TWICE A DAY WITH A MEAL 180 tablet 0  . predniSONE (DELTASONE) 10 MG tablet Take 10 mg by mouth daily with breakfast.     No current facility-administered medications on file prior to visit.     Allergies as in chart  Objective:  General: Alert and oriented x3 in no acute distress  Dermatology: No open lesions bilateral lower extremities, no webspace macerations, no ecchymosis bilateral, all nails x 10 are well manicured.  Vascular: Dorsalis Pedis and Posterior Tibial pedal pulses 2/4, Capillary Fill Time 3 seconds, (+) pedal hair growth bilateral, no edema bilateral lower extremities, Temperature gradient within normal limits.  Neurology: Gross sensation intact via light touch bilateral, (-) Tinels sign right or left foot.   Musculoskeletal: Mild tenderness with palpation bilateral bunion deformity, limitation without crepitus with range of motion, deformity semi-reducible, tracking not trackbound, there is mild 1st ray hypermobility noted bilateral. Midtarsal, Subtalar joint, and ankle joint range of motion is within normal limits. On weightbearing exam, there is  decreased 1st MTPJ rom bilateral with functional limitus noted, there is medial arch collapse bilateral on weightbearing, rearfoot valgus, forefoot slight abduction with HAV deformity supported on ground with  no second toe crossover deformity noted.   Gait: Non-Antalgic gait with increased medial arch collapse and pronatory influence noted bilateral with medial 1st MTPJ roll-off at toe-off, heel off within normal limits.   Xrays  Right/Left Foot    Impression: Intermetatarsal angle above normal limits with signs of arthritis on L>R at 1st MTPJ. +Cal spur. + Pes planus.       Assessment and Plan: Problem List Items Addressed This Visit    None    Visit Diagnoses    Bunion    -  Primary   bil   Relevant Medications   meloxicam (MOBIC) 15 MG tablet   Other Relevant Orders   DG Foot 2 Views Left (Completed)   DG Foot 2 Views Right (Completed)   Capsulitis       Relevant Medications   meloxicam (MOBIC) 15 MG tablet   Foot pain, bilateral           -Complete examination performed -Xrays reviewed -Discussed treatement options; discussed HAV deformity;conservative and  Surgical management; risks, benefits, alternatives discussed. All patient's questions answered. -Dispensed bunion shields to use bilateral  -Rx Mobic to take as instructed  -Recommend continue with good supportive shoes and inserts.  -Patient to return to office in July for surgery consult or sooner if condition worsens.  Landis Martins, DPM

## 2016-11-16 ENCOUNTER — Telehealth: Payer: Self-pay | Admitting: Sports Medicine

## 2016-11-16 NOTE — Telephone Encounter (Addendum)
I spoke with pt, she states she had facial rash and lip swelling on the Mobic, stopped it 2 weeks ago. I told pt not to take anymore and I would inform Dr. Cannon Kettle and call if there were other instructions.11/17/2016-I informed pt of Dr. Leeanne Rio instructions to take Benadryl for the rash and if persist go to PCP. Pt states understanding.

## 2016-11-16 NOTE — Telephone Encounter (Signed)
Yes that's correct. She can take Bendryl and if persists please see her PCP Thanks Dr. Cannon Kettle

## 2016-11-16 NOTE — Telephone Encounter (Signed)
Patient called saying that she is no longer taking the medication she was prescribed due to it causing an allergic reaction.

## 2016-11-18 ENCOUNTER — Ambulatory Visit: Payer: 59 | Admitting: Family Medicine

## 2016-11-21 NOTE — Progress Notes (Signed)
Chief Complaint  Patient presents with  . Diabetes    nonfasting med check and chest pain on and off since Feb when last seen, wants to know of this could be RA.   Note--patient was 30 minutes late for her visit.  Patient presents to follow up on diabetes. Her sugars were high in February. She didn't tolerate 1000mg  BID of metformin, only tolerated 1000mg .  She was supposed to start [place].  She states that she called and told us she wouldn't take this, but there is no documentation of such notification.  She never started the tradjenta due to fears that this is the medication that caused her mother's cancer.  She instead went back up to twice daily of the metformin.  She does have some diarrhea, but it is tolerable.  Sugars are running 150's-160.   Lab Results  Component Value Date   HGBA1C 8.7 08/18/2016   She was supposed to f/u 2 weeks later and bring list of sugars for [place] to reassess her blood sugars after starting the tradjenta (which she never did). She is currently on 20mg  prednisone daily, along with methotrexate.  Chest pain--mentioned at her last visit in February.  EKG didn't show acute changes.  She had also been having arm pain (and had had a shoulder injection shortly before last visit, shoulder pain had resolved). She appears quite frustrated in that she continues to have ongoing pain in the left side of the chest, that radiates into her left arm.   "they keep running EKG's" telling her it isn't her heart, but not telling her what it is or fixing it. Had CP at work 2 weeks ago, paramedics were called; she was told it was not her heart. (didnt' go to ER).  Pain is not related to exertion, eating, arm movements or particular activities.  At work, while in the office she had acute onset of chest pain--left side, radiating to left shoulder and left arm. Not sore to touch.  Lasted 3 minutes.  Pain 10/10.  Described as a pressure. Pain in chest resolves once it moves into the  arm. She was not lifting anything.  Was getting paper out of filing cabinet.  Happens once a day, different times of day.  She usually just sits down and bends over until it goes away.  Feels better when she bends forward (seated or standing).   She has h/o polychondritis and is under the care of rheumatologist. She is asking about testing for rheumatoid arthritis  PMH PSH, SH reviewed  Outpatient Encounter Prescriptions as of 11/22/2016  Medication Sig Note  . amLODipine (NORVASC) 5 MG tablet TAKE 1 TABLET EVERY DAY   . atorvastatin (LIPITOR) 20 MG tablet TAKE 1 TABLET (20 MG TOTAL) BY MOUTH DAILY.   . cholecalciferol (VITAMIN D) 1000 units tablet Take 1,000 Units by mouth daily. 03/29/2016: She says these are prescription  . hydrochlorothiazide (HYDRODIURIL) 25 MG tablet Take 25 mg by mouth daily.   . metFORMIN (GLUCOPHAGE) 1000 MG tablet TAKE 1 TABLET TWICE A DAY WITH A MEAL   . methotrexate 2.5 MG tablet Take 2.5 mg by mouth 2 (two) times a week.   . predniSONE (DELTASONE) 20 MG tablet Take 20 mg by mouth daily with breakfast.   . [DISCONTINUED] predniSONE (DELTASONE) 10 MG tablet Take 10 mg by mouth daily with breakfast. 08/18/2016: Last dose 2 days ago, stopped by rheum  . albuterol (PROVENTIL) (2.5 MG/3ML) 0.083% nebulizer solution Take 2.5 mg by nebulization every 4 (  four) hours as needed for wheezing or shortness of breath. Reported on 11/26/2015   . cyclobenzaprine (FLEXERIL) 10 MG tablet Take 1 tablet po QHS prn (Patient not taking: Reported on 08/12/2016)   . dapagliflozin propanediol (FARXIGA) 5 MG TABS tablet Take 5 mg by mouth daily.   Marland Kitchen ibuprofen (ADVIL,MOTRIN) 600 MG tablet Take 1 tablet (600 mg total) by mouth 3 (three) times daily. (Patient not taking: Reported on 11/22/2016)   . meloxicam (MOBIC) 15 MG tablet Take 1 tablet (15 mg total) by mouth daily. (Patient not taking: Reported on 11/22/2016)   . [DISCONTINUED] linagliptin (TRADJENTA) 5 MG TABS tablet Take 1 tablet (5 mg  total) by mouth daily. (Patient not taking: Reported on 07/27/2016) 11/22/2016: patient refuses to take   No facility-administered encounter medications on file as of 11/22/2016.    Allergies reviewed  ROS: no fever, chills, headaches, dizziness, URI, shortness of breath, cough.  No nausea, vomiting, heartburn, reflux.  Some loose stools related to metformin, no other bowel changes.  Denies polydipsia, polyuria.  PHYSICAL EXAM:  BP 118/70 (BP Location: Left Arm, Patient Position: Sitting, Cuff Size: Normal)   Pulse 88   Ht 5\' 3"  (1.6 m)   Wt 200 lb 6.4 oz (90.9 kg)   LMP 10/09/2015   BMI 35.50 kg/m   Well appearing female.  She appears very frustrated, intermittently irritable, other times smiling. HEENT: conjunctiva and sclera are clear, anicteric. EOMI Neck: no lymphadenopathy, thyromegaly or mass Heart: regular rate and rhythm Lungs: clear bilaterally Chest wall: nontender.  Area of discomfort (when she has the pain) Is between the breasts, slightly to the left of midline.  Nontender today nontender at left shoulder, upper arm Abdomen: soft, nontender, no mass. No epigastric tenderness, organomegaly Extremities: no edema Neuro: alert and oriented  Lab Results  Component Value Date   HGBA1C 8.9 11/22/2016    ASSESSMENT/PLAN:  Uncontrolled type 2 diabetes mellitus without complication, without long-term current use of insulin (HCC) - risks of uncontrolled DM reviewed, and need for communication re: meds. Start Farxiga 5mg . f/u 2-3 weeks with list of sugars - Plan: dapagliflozin propanediol (FARXIGA) 5 MG TABS tablet  Type 2 diabetes mellitus without complication, without long-term current use of insulin ([place]) - Plan: HgB A1c  Chest pain, unspecified type - noncardiac.  Given location of pain, suspect related to cartilage and her polychondritis.    Willing to try Farxiga/Jardiance--risks and side effects reviewed in detail.  Advised to contact us with any concerns she may  have prior to stopping (or not even starting) medication.  Advised to stop taking with any illness that could contribute to dehydration, potential risk of UTI/yeast infections.  Continue metformin 1000mg  BID  F/u 2-4 weeks with list of blood sugars.  Chest pain--she is quite upset about not having a diagnosis. I tried to reassure her that sometimes know what it is NOT, can be very helpful/reassuring.  I have not been running the work-up for this, as she has mentioned to many of her other doctors, and had CT scans and other studies.  She will mention to her rheumatologist, as well as ask her questions re: RA, as I'm sure they have done testing for this in the past.

## 2016-11-22 ENCOUNTER — Encounter: Payer: Self-pay | Admitting: Family Medicine

## 2016-11-22 ENCOUNTER — Ambulatory Visit (INDEPENDENT_AMBULATORY_CARE_PROVIDER_SITE_OTHER): Payer: 59 | Admitting: Family Medicine

## 2016-11-22 VITALS — BP 118/70 | HR 88 | Ht 63.0 in | Wt 200.4 lb

## 2016-11-22 DIAGNOSIS — E1165 Type 2 diabetes mellitus with hyperglycemia: Secondary | ICD-10-CM | POA: Diagnosis not present

## 2016-11-22 DIAGNOSIS — R079 Chest pain, unspecified: Secondary | ICD-10-CM

## 2016-11-22 DIAGNOSIS — E119 Type 2 diabetes mellitus without complications: Secondary | ICD-10-CM | POA: Diagnosis not present

## 2016-11-22 DIAGNOSIS — IMO0001 Reserved for inherently not codable concepts without codable children: Secondary | ICD-10-CM

## 2016-11-22 LAB — POCT GLYCOSYLATED HEMOGLOBIN (HGB A1C): Hemoglobin A1C: 8.9

## 2016-11-22 MED ORDER — DAPAGLIFLOZIN PROPANEDIOL 5 MG PO TABS: 5.0000 mg | ORAL_TABLET | Freq: Every day | ORAL | 0 refills | Status: DC

## 2016-11-22 NOTE — Patient Instructions (Addendum)
Continue to take metformin twice daily. Start taking Farxiga samples once every morning. Keep a list of your blood sugars (with columns for date, morning, evening and "comments"). Bring this list to your next visit in 2-3 weeks.  Dapagliflozin tablets What is this medicine? DAPAGLIFLOZIN (DAP a gli FLOE zin) helps to treat type 2 diabetes. It helps to control blood sugar. Treatment is combined with diet and exercise. This medicine may be used for other purposes; ask your health care provider or pharmacist if you have questions. COMMON BRAND NAME(S): Wilder Glade What should I tell my health care provider before I take this medicine? They need to know if you have any of these conditions: -bladder cancer -dehydration -diabetic ketoacidosis -diet low in salt -eating less due to illness, surgery, dieting, or any other reason -having surgery -high cholesterol -history of pancreatitis or pancreas problems -history of yeast infection of the penis or vagina -if you often drink alcohol -infections in the bladder, kidneys, or urinary tract -kidney disease -low blood pressure -on hemodialysis -problems urinating -type 1 diabetes -uncircumcised female -an unusual or allergic reaction to dapagliflozin, other medicines, foods, dyes, or preservatives -pregnant or trying to get pregnant -breast-feeding How should I use this medicine? Take this medicine by mouth with a glass of water. Follow the directions on the prescription label. You can take it with or without food. If it upsets your stomach, take it with food. Take this medicine in the morning. Take your dose at the same time each day. Do not take more often than directed. Do not stop taking except on your doctor's advice. A special MedGuide will be given to you by the pharmacist with each prescription and refill. Be sure to read this information carefully each time. Talk to your pediatrician regarding the use of this medicine in children. Special  care may be needed. Overdosage: If you think you have taken too much of this medicine contact a poison control center or emergency room at once. NOTE: This medicine is only for you. Do not share this medicine with others. What if I miss a dose? If you miss a dose, take it as soon as you can. If it is almost time for your next dose, take only that dose. Do not take double or extra doses. What may interact with this medicine? Do not take this medicine with any of the following medications: -gatifloxacin This medicine may also interact with the following medications: -alcohol -certain medicines for blood pressure, heart disease -diuretics -insulin -nateglinide -pioglitazone -quinolone antibiotics like ciprofloxacin, levofloxacin, ofloxacin -repaglinide -some herbal dietary supplements -steroid medicines like prednisone or cortisone -sulfonylureas like glimepiride, glipizide, glyburide -thyroid medicine This list may not describe all possible interactions. Give your health care provider a list of all the medicines, herbs, non-prescription drugs, or dietary supplements you use. Also tell them if you smoke, drink alcohol, or use illegal drugs. Some items may interact with your medicine. What should I watch for while using this medicine? Visit your doctor or health care professional for regular checks on your progress. This medicine can cause a serious condition in which there is too much acid in the blood. If you develop nausea, vomiting, stomach pain, unusual tiredness, or breathing problems, stop taking this medicine and call your doctor right away. If possible, use a ketone dipstick to check for ketones in your urine. A test called the HbA1C (A1C) will be monitored. This is a simple blood test. It measures your blood sugar control over the last 2 to  3 months. You will receive this test every 3 to 6 months. Learn how to check your blood sugar. Learn the symptoms of low and high blood sugar and  how to manage them. Always carry a quick-source of sugar with you in case you have symptoms of low blood sugar. Examples include hard sugar candy or glucose tablets. Make sure others know that you can choke if you eat or drink when you develop serious symptoms of low blood sugar, such as seizures or unconsciousness. They must get medical help at once. Tell your doctor or health care professional if you have high blood sugar. You might need to change the dose of your medicine. If you are sick or exercising more than usual, you might need to change the dose of your medicine. Do not skip meals. Ask your doctor or health care professional if you should avoid alcohol. Many nonprescription cough and cold products contain sugar or alcohol. These can affect blood sugar. Wear a medical ID bracelet or chain, and carry a card that describes your disease and details of your medicine and dosage times. What side effects may I notice from receiving this medicine? Side effects that you should report to your doctor or health care professional as soon as possible: -allergic reactions like skin rash, itching or hives, swelling of the face, lips, or tongue -breathing problems -dizziness -feeling faint or lightheaded, falls -muscle weakness -nausea, vomiting, unusual stomach upset or pain -signs and symptoms of low blood sugar such as feeling anxious, confusion, dizziness, increased hunger, unusually weak or tired, sweating, shakiness, cold, irritable, headache, blurred vision, fast heartbeat, loss of consciousness -signs and symptoms of a urinary tract infection, such as fever, chills, a burning feeling when urinating, blood in the urine, back pain -trouble passing urine or change in the amount of urine, including an urgent need to urinate more often, in larger amounts, or at night -penile discharge, itching, or pain in men -unusual tiredness -vaginal discharge, itching, or odor in women Side effects that usually do  not require medical attention (report to your doctor or health care professional if they continue or are bothersome): -constipation -mild increase in urination -stuffy or runny nose -sore throat -thirsty This list may not describe all possible side effects. Call your doctor for medical advice about side effects. You may report side effects to FDA at 1-800-FDA-1088. Where should I keep my medicine? Keep out of the reach of children. Store at room temperature between 15 and 30 degrees C (59 and 86 degrees F). Throw away any unused medicine after the expiration date. NOTE: This sheet is a summary. It may not cover all possible information. If you have questions about this medicine, talk to your doctor, pharmacist, or health care provider.  2018 Elsevier/Gold Standard (2015-07-24 08:46:40)

## 2016-11-23 ENCOUNTER — Encounter: Payer: Self-pay | Admitting: Family Medicine

## 2016-12-01 ENCOUNTER — Other Ambulatory Visit: Payer: Self-pay | Admitting: Family Medicine

## 2016-12-01 DIAGNOSIS — I10 Essential (primary) hypertension: Secondary | ICD-10-CM

## 2016-12-10 ENCOUNTER — Encounter: Payer: Self-pay | Admitting: Family Medicine

## 2016-12-10 ENCOUNTER — Ambulatory Visit (INDEPENDENT_AMBULATORY_CARE_PROVIDER_SITE_OTHER): Payer: 59 | Admitting: Family Medicine

## 2016-12-10 VITALS — BP 110/70 | HR 80 | Temp 98.8°F | Wt 198.2 lb

## 2016-12-10 DIAGNOSIS — D649 Anemia, unspecified: Secondary | ICD-10-CM

## 2016-12-10 DIAGNOSIS — R5383 Other fatigue: Secondary | ICD-10-CM | POA: Diagnosis not present

## 2016-12-10 DIAGNOSIS — K921 Melena: Secondary | ICD-10-CM | POA: Diagnosis not present

## 2016-12-10 LAB — CBC WITH DIFFERENTIAL/PLATELET
BASOS ABS: 0 {cells}/uL (ref 0–200)
BASOS PCT: 0 %
EOS PCT: 0 %
Eosinophils Absolute: 0 cells/uL — ABNORMAL LOW (ref 15–500)
HCT: 38.9 % (ref 35.0–45.0)
HEMOGLOBIN: 12.5 g/dL (ref 11.7–15.5)
LYMPHS ABS: 1212 {cells}/uL (ref 850–3900)
Lymphocytes Relative: 12 %
MCH: 27.3 pg (ref 27.0–33.0)
MCHC: 32.1 g/dL (ref 32.0–36.0)
MCV: 84.9 fL (ref 80.0–100.0)
MPV: 9.3 fL (ref 7.5–12.5)
Monocytes Absolute: 404 cells/uL (ref 200–950)
Monocytes Relative: 4 %
NEUTROS ABS: 8484 {cells}/uL — AB (ref 1500–7800)
Neutrophils Relative %: 84 %
PLATELETS: 442 10*3/uL — AB (ref 140–400)
RBC: 4.58 MIL/uL (ref 3.80–5.10)
RDW: 14.1 % (ref 11.0–15.0)
WBC: 10.1 10*3/uL (ref 4.0–10.5)

## 2016-12-10 LAB — HEMOCCULT GUIAC POC 1CARD (OFFICE): FECAL OCCULT BLD: NEGATIVE

## 2016-12-10 NOTE — Progress Notes (Signed)
   Subjective:    Patient ID: Michelle Mack, female    DOB: [birth year], 51 y.o.   MRN: [id number]  HPI Chief Complaint  Patient presents with  . blood in stool    blood in stool. been going on for a while but today was more than normal   She is here with complaints of blood in her stool today. States a moderate amount of bright red blood was in the toilet today with a bowel movement. This occurred once. States she has noticed blood on toilet tissue for the past 2 weeks.  States her stools have been formed but no constipation or straining. Denies diarrhea or abdominal pain.  States stools have been soft recently. Denies rectal pain now but did have some mild burning 1 month ago and it spontaneously resolved. Denies any changes in bowel movements. Denies history of hemorrhoids.   Reports feeling more tired than usual over the past 2 weeks. Has been sleeping more. She does have a history of anemia.   Dr. Collene Mares is her GI. States she saw her in the past year for GERD and had an EDG. Denies having stomach ulcers.   Denies every having a colonoscopy.   Does not take NSAIDS. She is on prednisone for RA.  No significant alcohol use.   Denies, fever, chills, dizziness, chest pain, palpitations, shortness of breath, N/V, urinary symptoms.   Reviewed allergies, medications, past medical, surgical,  and social history.   Review of Systems Pertinent positives and negatives in the history of present illness.     Objective:   Physical Exam  Constitutional: She is oriented to person, place, and time. She appears well-developed and well-nourished. No distress.  Cardiovascular: Normal rate, regular rhythm, normal heart sounds and intact distal pulses.   Pulmonary/Chest: Effort normal and breath sounds normal.  Abdominal: Soft. Bowel sounds are normal. She exhibits no distension and no mass. There is no hepatosplenomegaly. There is no tenderness. There is no rebound, no guarding and no CVA tenderness.   Genitourinary: Rectum normal. Rectal exam shows no external hemorrhoid, no tenderness and guaiac negative stool.  Neurological: She is alert and oriented to person, place, and time. No cranial nerve deficit.  Skin: Skin is warm and dry. No pallor.  Psychiatric: She has a normal mood and affect. Her behavior is normal. Thought content normal.   BP 110/70   Pulse 80   Temp 98.8 F (37.1 C) (Oral)   Wt 198 lb 3.2 oz (89.9 kg)   LMP 10/09/2015   SpO2 98%   BMI 35.11 kg/m       Assessment & Plan:  Blood in stool  Fatigue, unspecified type  Anemia, unspecified type  POCT hemoccult done and negative. Discussed that her stool is negative for blood right now. She is hemodynamically stable. Asymptomatic. Recommend that she schedule with Dr. Collene Mares for further evaluation. She called Dr. Lorie Apley office before leaving today and has an appointment for next week.  Review of labs shows that her vitamin D level and TSH have been normal. She is taking a daily vitamin D supplement.  She has a history of anemia but most recent hemoglobin was normal. Will repeat CBC, BMP today.

## 2016-12-10 NOTE — Patient Instructions (Signed)
Call and schedule with Dr. Collene Mares as discussed.  We will call you with lab results.

## 2016-12-11 LAB — BASIC METABOLIC PANEL
BUN: 14 mg/dL (ref 7–25)
CO2: 23 mmol/L (ref 20–31)
CREATININE: 0.95 mg/dL (ref 0.50–1.10)
Calcium: 9.8 mg/dL (ref 8.6–10.2)
Chloride: 98 mmol/L (ref 98–110)
GLUCOSE: 252 mg/dL — AB (ref 65–99)
Potassium: 4.1 mmol/L (ref 3.5–5.3)
Sodium: 139 mmol/L (ref 135–146)

## 2016-12-28 ENCOUNTER — Other Ambulatory Visit: Payer: Self-pay | Admitting: Family Medicine

## 2016-12-28 DIAGNOSIS — I10 Essential (primary) hypertension: Secondary | ICD-10-CM

## 2016-12-28 DIAGNOSIS — E7800 Pure hypercholesterolemia, unspecified: Secondary | ICD-10-CM

## 2016-12-29 LAB — HM COLONOSCOPY

## 2016-12-31 ENCOUNTER — Encounter: Payer: Self-pay | Admitting: Family Medicine

## 2017-01-03 ENCOUNTER — Other Ambulatory Visit: Payer: Self-pay | Admitting: Obstetrics and Gynecology

## 2017-01-03 DIAGNOSIS — Z1231 Encounter for screening mammogram for malignant neoplasm of breast: Secondary | ICD-10-CM

## 2017-01-07 ENCOUNTER — Ambulatory Visit
Admission: RE | Admit: 2017-01-07 | Discharge: 2017-01-07 | Disposition: A | Discharge status: Home / Self Care | Payer: Managed Care, Other (non HMO) | Source: Ambulatory Visit | Attending: Obstetrics and Gynecology | Admitting: Obstetrics and Gynecology

## 2017-01-07 DIAGNOSIS — Z1231 Encounter for screening mammogram for malignant neoplasm of breast: Secondary | ICD-10-CM

## 2017-01-09 NOTE — Progress Notes (Signed)
Chief Complaint  Patient presents with  . Follow-up    on blood sugars. Was supposed to follow up 2-4 weeks from 5/21 visit.   . Medication Refill    needs 90 day [facility]ill [facility]    Patient presents to follow up on her diabetes. FWilder Gladewas added at her last visit 5/21.  She continues to take metformin 1000 mg BID.  She took the FIranfor the two weeks of samples.  She had a yeast infection early on within the samples (symptoms weren't too bothersome, a little "dry and itchy"), which was treated by her GYN (already had appointment). She was due to come back in 2 weeks, to see how she was doing and to get rx (?needing higher dose or not).  She did not come then for appointment, ran out of samples and didn't call for prescription or to let uKoreaknow.  She presents for follow-up today.  Morning sugars range from 114-206 (quite variable, many 120's, but just as many 140-170, only once over 200).  A few sugars were checked in the afternoon (none recent), and were all over 200.  Lab Results  Component Value Date   HGBA1C 8.9 11/22/2016   She is requesting atorvastatin refill.  Denies side effects.  Asking how long she will need to take this medication for.  Lab Results  Component Value Date   CHOL 117 08/23/2016   HDL 49 (L) 08/23/2016   LDLCALC 44 08/23/2016   TRIG 122 08/23/2016   CHOLHDL 2.4 08/23/2016   PMH, PSH, SH reviewed  Outpatient Encounter Prescriptions as of 01/10/2017  Medication Sig Note  . albuterol (PROVENTIL) (2.5 MG/3ML) 0.083% nebulizer solution Take 2.5 mg by nebulization every 4 (four) hours as needed for wheezing or shortness of breath. Reported on 11/26/2015   . amLODipine (NORVASC) 5 MG tablet TAKE 1 TABLET EVERY DAY   . atorvastatin (LIPITOR) 20 MG tablet TAKE 1 TABLET (20 MG TOTAL) BY MOUTH DAILY.   . cholecalciferol (VITAMIN D) 1000 units tablet Take 1,000 Units by mouth daily. 03/29/2016: She says these are prescription  .  hydrochlorothiazide (HYDRODIURIL) 25 MG tablet Take 25 mg by mouth daily.   . metFORMIN (GLUCOPHAGE) 1000 MG tablet TAKE 1 TABLET TWICE A DAY WITH A MEAL   . methotrexate 2.5 MG tablet Take 2.5 mg by mouth 2 (two) times a week. 01/10/2017: Takes 2 tablets once a week  . predniSONE (DELTASONE) 20 MG tablet Take 20 mg by mouth daily with breakfast.   . dapagliflozin propanediol (FARXIGA) 5 MG TABS tablet Take 5 mg by mouth daily. (Patient not taking: Reported on 01/10/2017) 01/10/2017: Took two weeks of samples only   No facility-administered encounter medications on file as of 01/10/2017.    Also taking a folic acid  Allergies reviewed--see list.  ROS: no fever, chills, headaches, dizziness, palpitations, shortness of breath, URI symptoms, cough, nausea, vomiting, bowel changes, urinary complaints.  Currently without any vaginal discharge, itching.  She continues to have constant chest pain, unchanged, no exertional chest pain. No bleeding, bruising, rash.  PHYSICAL EXAM:  BP 120/78 (BP Location: Left Arm, Patient Position: Sitting, Cuff Size: Normal)   Pulse 68   Ht _0  (1.6 m)   Wt 201 lb 3.2 oz (91.3 kg)   LMP 10/09/2015   BMI 35.64 kg/m    Well developed, pleasant, overweight female in no distress HEENT: conjunctiva and sclera clear Neck: no lymphadenopathy, thyromegaly or mass Heart: regular rate  and rhythm Lungs: clear bilaterally Extremities: no edema, normal pulses Skin: normal turgor, no rash Psych: normal mood, affect, hygiene and grooming Neuro: alert and oriented, normal cranial nerves, strength, gait  ASSESSMENT/PLAN:  Counseled re: potential treatment options for diabetes (she refuses Tradjenta or that class of meds; discussed injectables such as Bydureon/Trulicity/Victoza, and basal insulins. Briefly discussed other oral meds, including glipizide which I don't recommend.  Discussed the potential risks/side effects of these meds. Given that she only had mild yeast  infection early on, suspect that she will likely do the best by continuing [place] rather than changing course.  Discussed A1c goals, sugar goals, encouraged her to check more post-prandial or evening sugars, as that seems to be when she is high.  Reviewed proper diet, exercise, recommendation for weight loss.  Questions answered re: how her prednisone use fits into her diabetes, and how A1c and sugar goal are not effected by this.  Discussed risks of poorly controlled diabetes.  Farxiga dose increased to 44m, given diflucan #2 to have on hand prn for yeast infections (how to use reviewed), and continue BID metformin.   F/u after 8/21 Will need c-met and A1c. She reports having labs done q2 weeks for another provider--no results in [place]--asked to get copies of labs sent when done.  30 min visit, more than 1/2 spent counseling.   Restart Farxiga--we will increase to the 167mdose.  I'm also giving you a prescription for fluconazole--this is the medication to treat a yeast infection.  If you develop vaginal discharge and itching, take ONE pill.  You are being given two pills. Take just one for a yeast infection.  Take the second a week later if you have ongoing symptoms (or save it for the next yeast infection, if there is another).  Continue to monitor your sugars.  At least 2-3 times/week try and check an evening sugar (bedtime, 2 hours after a meal)  Please have them send me a copy of the labs you get done elsewhere (every 2 weeks), as I do not have access to view these results.

## 2017-01-10 ENCOUNTER — Telehealth: Payer: Self-pay

## 2017-01-10 ENCOUNTER — Encounter: Payer: Self-pay | Admitting: Family Medicine

## 2017-01-10 ENCOUNTER — Ambulatory Visit (INDEPENDENT_AMBULATORY_CARE_PROVIDER_SITE_OTHER): Payer: 59 | Admitting: Family Medicine

## 2017-01-10 VITALS — BP 120/78 | HR 68 | Ht 63.0 in | Wt 201.2 lb

## 2017-01-10 DIAGNOSIS — IMO0001 Reserved for inherently not codable concepts without codable children: Secondary | ICD-10-CM

## 2017-01-10 DIAGNOSIS — E1165 Type 2 diabetes mellitus with hyperglycemia: Secondary | ICD-10-CM

## 2017-01-10 DIAGNOSIS — E7800 Pure hypercholesterolemia, unspecified: Secondary | ICD-10-CM | POA: Diagnosis not present

## 2017-01-10 MED ORDER — FLUCONAZOLE 150 MG PO TABS
150.0000 mg | ORAL_TABLET | Freq: Once | ORAL | 0 refills | Status: AC
Start: 2017-01-10 — End: 2017-01-10

## 2017-01-10 MED ORDER — DAPAGLIFLOZIN PROPANEDIOL 10 MG PO TABS: 10.0000 mg | ORAL_TABLET | Freq: Every day | ORAL | 2 refills | Status: DC

## 2017-01-10 MED ORDER — ATORVASTATIN CALCIUM 20 MG PO TABS: 20.0000 mg | ORAL_TABLET | Freq: Every day | ORAL | 1 refills | Status: DC

## 2017-01-10 NOTE — Patient Instructions (Signed)
  Restart Farxiga--we will increase to the 10mg  dose.  I'm also giving you a prescription for fluconazole--this is the medication to treat a yeast infection.  If you develop vaginal discharge and itching, take ONE pill.  You are being given two pills. Take just one for a yeast infection.  Take the second a week later if you have ongoing symptoms (or save it for the next yeast infection, if there is another).  Continue to monitor your sugars.  At least 2-3 times/week try and check an evening sugar (bedtime, 2 hours after a meal)  Please have them send me a copy of the labs you get done elsewhere (every 2 weeks), as I do not have access to view these results.

## 2017-01-10 NOTE — Telephone Encounter (Signed)
Made a note to myself I will check around 02/09/17 to see if she has scheduled something for around 02/23/17, if not I will call her and try to schedule.

## 2017-01-10 NOTE — Telephone Encounter (Signed)
Noted.  FYI to V--will will need to contact her in 4 weeks if she hasn't scheduled

## 2017-01-10 NOTE — Telephone Encounter (Signed)
Pt declined to schedule 6 week f/u as directed by you at check out- sending to you as FYI. She was very quiet and would not say much. Victorino December

## 2017-01-11 ENCOUNTER — Ambulatory Visit: Payer: 59 | Admitting: Sports Medicine

## 2017-01-12 ENCOUNTER — Ambulatory Visit (INDEPENDENT_AMBULATORY_CARE_PROVIDER_SITE_OTHER): Payer: 59 | Admitting: Family Medicine

## 2017-01-12 ENCOUNTER — Encounter: Payer: Self-pay | Admitting: Family Medicine

## 2017-01-12 VITALS — BP 110/70 | HR 84 | Temp 98.9°F | Wt 201.8 lb

## 2017-01-12 DIAGNOSIS — R1031 Right lower quadrant pain: Secondary | ICD-10-CM

## 2017-01-12 LAB — POCT URINALYSIS DIP (PROADVANTAGE DEVICE)
BILIRUBIN UA: NEGATIVE
GLUCOSE UA: NEGATIVE mg/dL
Ketones, POC UA: NEGATIVE mg/dL
LEUKOCYTES UA: NEGATIVE
NITRITE UA: NEGATIVE
Protein Ur, POC: NEGATIVE mg/dL
Specific Gravity, Urine: 1.03
UUROB: NEGATIVE
pH, UA: 6 (ref 5.0–8.0)

## 2017-01-12 NOTE — Addendum Note (Signed)
Addended by: Minette Headland A on: 01/12/2017 10:57 AM   Modules accepted: Orders

## 2017-01-12 NOTE — Progress Notes (Signed)
   Subjective:    Patient ID: Michelle Mack, female    DOB: 1965-12-08, 51 y.o.   MRN: [id number]  HPI Chief Complaint  Patient presents with  . lower side pain    lower side pain, right side. started monday.  comes and goes   She is here with complaints of right lower side pain that started 3 days ago. Pain does not radiate and is intermittent. Pain was worse the first day and is gradually improving. States pain is at least 70% improved.  States pain seems to be worse with movement, especially when going from laying down to sitting upright.  Denies fever, chills, nausea, vomiting, diarrhea, urinary frequency, urgency or burning. No vaginal discharge.   She does recall that pain started the day following sexual activity. Denies dyspareunia.   Last bowel movement 2 hours ago and normal per patient. Did not affect her pain.  States pain seems to be worse after drinking wine 3 days ago.   She has not taken anything for pain. She is taking daily prednisone.  States she is going out of town to [place].   LMP: years ago per patient. She is postmenopausal.   Reviewed allergies, medications, past medical, surgical, and social history.   Review of Systems Pertinent positives and negatives in the history of present illness.     Objective:   Physical Exam  Constitutional: She appears well-developed and well-nourished. No distress.  Neck: Normal range of motion. Neck supple.  Cardiovascular: Normal rate and regular rhythm.   Pulmonary/Chest: Effort normal and breath sounds normal.  Abdominal: Soft. Normal appearance and bowel sounds are normal. There is no hepatosplenomegaly. There is tenderness. There is no rigidity, no rebound, no guarding, no CVA tenderness, no tenderness at McBurney's point and negative Murphy's sign.    Musculoskeletal:       Right hip: Normal.       Left hip: Normal.       Lumbar back: Normal.  Lymphadenopathy:    She has no cervical adenopathy.  Skin:  Skin is warm and dry. No rash noted. No pallor.  Psychiatric: She has a normal mood and affect. Her speech is normal and behavior is normal. Thought content normal.   BP 110/70   Pulse 84   Temp 98.9 F (37.2 C) (Oral)   Wt 201 lb 12.8 oz (91.5 kg)   LMP 10/09/2015   BMI 35.75 kg/m       Assessment & Plan:  Intermittent right lower quadrant abdominal pain  Discussed possible etiologies and her pain appears to be related to a musculoskeletal etiology. Discussed that she does not appear to have any symptoms that speak to appendicitis or infection of any type. Her urine is not suspicious. Her pain is at least 70% improved from onset. Plan to have her do watchful waiting and let us know if she has any new or worsening symptoms.

## 2017-01-20 ENCOUNTER — Telehealth: Payer: Self-pay | Admitting: Family Medicine

## 2017-01-20 MED ORDER — IBUPROFEN 800 MG PO TABS: 800.0000 mg | ORAL_TABLET | Freq: Three times a day (TID) | ORAL | 0 refills | Status: DC | PRN

## 2017-01-20 NOTE — Telephone Encounter (Signed)
Ok to refill. If pain is not resolving then she may need to be seen again.

## 2017-01-20 NOTE — Telephone Encounter (Signed)
Pt still having some pain. Requesting script for Ibuprofen 800 mg

## 2017-01-20 NOTE — Telephone Encounter (Signed)
Pt was notified. And med sent in

## 2017-02-23 ENCOUNTER — Encounter: Payer: Self-pay | Admitting: Family Medicine

## 2017-03-07 ENCOUNTER — Other Ambulatory Visit: Payer: Self-pay | Admitting: Family Medicine

## 2017-03-07 DIAGNOSIS — I10 Essential (primary) hypertension: Secondary | ICD-10-CM

## 2017-03-08 ENCOUNTER — Telehealth: Payer: Self-pay | Admitting: Family Medicine

## 2017-03-08 ENCOUNTER — Other Ambulatory Visit (INDEPENDENT_AMBULATORY_CARE_PROVIDER_SITE_OTHER): Payer: 59

## 2017-03-08 DIAGNOSIS — Z111 Encounter for screening for respiratory tuberculosis: Secondary | ICD-10-CM

## 2017-03-08 NOTE — Telephone Encounter (Signed)
Changed 03/15/17 appt to a CPE. Pt came in for a TB today. A copy of the form that need to be completed has been placed in Dr. Johnsie Kindred red folder for review.

## 2017-03-08 NOTE — Telephone Encounter (Signed)
Pt has a form that need to be completed for work stating that she has completed a physical has not indication of TB. She need the form signed today in order to continue to work. She wants to know if she can change he 03/15/17 appointment to a CPE and she wants to come in for the TB today. She will have to be out of work until she is able to get a CPE so if Dr Tomi Bamberger can see her sooner it would be great as well.

## 2017-03-08 NOTE — Telephone Encounter (Signed)
Ok to change her currently scheduled appointment to a CPE (make it 45 mins please). I cannot get her in for a physical sooner.  Okay for TB test today.  Hopefully she left copy of form for me to review.  Sometimes it doesn't actually require a complete physical, but some exam mostly reinforcing there is no evidence of infectious disease, etc.  If this is the case, I can see about maybe making her TB reading NV a 15 min visit so that the form can be completed and she can get back to work sooner--but she should still be a CPE next week, since we have never done one on her (I know she has a GYN and she is UTD on that).

## 2017-03-08 NOTE — Telephone Encounter (Signed)
Pt is coming in September

## 2017-03-10 LAB — TB SKIN TEST: TB Skin Test: NEGATIVE

## 2017-03-14 NOTE — Progress Notes (Signed)
Chief Complaint  Patient presents with  . Annual Exam    nonfasting annual exam, no pap-sees Dr. Cletis Media and UTD. Has been having LBP that radiates around to abdomen and nausea with temp.   . Flu Vaccine    declined.    Michelle Mack is a 51 y.o. female who presents for a complete physical.  She has the following concerns:  Pain at the left lower-mid back started 2 weeks ago.  Yesterday and today it seems better.  Yesterday she had a fever of 101, associated with nausea, and some decreased energy.  Denies diarrhea--has some constipation.  No URI symptoms, cough, urinary complaints.   Uncontrolled diabetes:  She was last seen in July, at which time she wasn't taking her [place].  We had a long discussion regarding possible treatment options.  She continues on Prednisone chronically. She refuses Tradjenta or that class of meds; discussed injectables such as Bydureon/Trulicity/Victoza, and basal insulins. Briefly discussed at that visit other oral meds, including glipizide which I don't recommend.  Discussed the potential risks/side effects of these meds. Given that she only had mild yeast infection early on, suspect that she will likely do the best by continuing [place] rather than changing course.  Farxiga dose was increased to 14m, and she was given diflucan #2 to have on hand prn for yeast infections (how to use reviewed), and continue BID metformin.  She as asked to check more post-prandial or evening sugars, as that seemed to be when she is high.   She states she is checking her blood sugars, not recording, but has memory in machine. She recalls that her sugars have been running:  120 in the mornings. Later in the day ranges from 130-160's.  She does report frequent urination. Denies polydipsia, blurred vision. She has a large lemonade (not sugar-free) with her from [employer].  She was noted to have yeast infection at her GYN exam in June, treated with terazol. She had taken diflucan  prior, but still had infection. (Has 1 tablet left at home).  Lab Results  Component Value Date   HGBA1C 8.9 11/22/2016   Hyperlipidemia follow-up:  Patient is reportedly following a low-fat, low cholesterol diet.  Compliant with medications (atorvastatin) and denies medication side effects. Lab Results  Component Value Date   CHOL 117 08/23/2016   HDL 49 (L) 08/23/2016   LDLCALC 44 08/23/2016   TRIG 122 08/23/2016   CHOLHDL 2.4 08/23/2016    Vitamin D deficiency:  Last level was normal in 08/2016, on 2000 IU vitamin D daily (had been low in 11/2015).  OSA: on CPAP, reports compliance.  Per pt, was supposed to have setting adjusted after last study, but doesn't think that was done.  Other doctors caring for patient:  GYN: Dr. RCletis MediaRheum: Dr. AVeneta Penton([facility], last seen 08/2016, for relapsing polychondritis) Pulm:  Dr. NEarly Osmond([facility], last seen 09/2016--for relapsing polychondritis, and OSA); next appt is scheduled for 04/2017 Podiatrist: Dr. SCannon Kettle(bunions) GI: Dr. MCollene MaresDentist: can't recall name, goes once yearly [facility]ho[facility](yearly diabetic eye exams, October)  Immunization History  Administered Date(s) Administered  . Hepatitis A 12/27/2007, 01/24/2008, 07/17/2008  . Hepatitis B 12/27/2007, 01/24/2008, 07/17/2008  . IPV 12/27/2007  . MMR 12/27/2007  . Meningococcal Polysaccharide 12/27/2007  . PPD Test 04/14/2011, 06/08/2013, 01/01/2015, 03/08/2017  . Td 12/28/1995  . Tdap 12/27/2007  . Typhoid Inactivated 12/27/2007  . Varicella 12/27/2007  . Yellow Fever 01/24/2008   Refuses flu  shots Last Pap smear: 2016, due again 2019; last GYN visit 12/2016, Dr. Cletis Media Last mammogram: 01/2017 Last colonoscopy: 12/2016 with Dr. Collene Mares. Internal hemorrhoids, repeat 10 years Last DEXA: never Dentist: goes yearly Ophtho: yearly.  Last diabetic eye exam was 04/2016, no retinopathy Exercise: goes to the gym 2x/week--30 minutes on treadmill, then 90 minute workout (cycles through  machines).   Past Medical History:  Diagnosis Date  . Anemia    on iron  . Back pain    tx with ibuprofen 820m  . Bronchitis    treated recently by abx  . Cough    non-productive  . DUB (dysfunctional uterine bleeding)   . Fibroid uterus    GYN--Dr. Rivard  . GERD (gastroesophageal reflux disease)   . Hypertension   . Infertility, female   . Polychondritis   . Recurrent upper respiratory infection (URI)    in October - tx with abx  . Type II or unspecified type diabetes mellitus without mention of complication, not stated as uncontrolled    diagnosed by Dr. HBerdine Addison   Past Surgical History:  Procedure Laterality Date  . [facility] narrowing in L mainstem bronchus and LUL  . ENDOMETRIAL VAPORIZATION W/ VERSAPOINT    . MYOMECTOMY  2002    Social History   Social History  . Marital status: Married    Spouse name: N/A  . Number of children: N/A  . Years of education: N/A   Occupational History  . Customer Service at [employer]  . Smoking status: Never Smoker  . Smokeless tobacco: Never Used  . Alcohol use Yes     Comment: socially 1-2 drinks, once a  month (mixed drinks)  . Drug use: No  . Sexual activity: Yes    Birth control/ protection: None   Other Topics Concern  . Not on file   Social History Narrative  . No narrative on file    Family History  Problem Relation Age of Onset  . Hypertension Mother   . Cancer Mother   . Hypertension Father   . Hypertension Sister   . Thyroid nodules Sister   . Diabetes Maternal Grandmother   . Breast cancer Maternal Grandmother   . Heart disease Maternal Grandmother   . Heart disease Paternal Grandmother     Outpatient Encounter Prescriptions as of 03/15/2017  Medication Sig Note  . amLODipine (NORVASC) 5 MG tablet TAKE 1 TABLET BY MOUTH EVERY DAY   . atorvastatin (LIPITOR) 20 MG tablet Take 1 tablet  (20 mg total) by mouth daily.   . cholecalciferol (VITAMIN D) 1000 units tablet Take 1,000 Units by mouth daily. 03/15/2017: Takes 2/d  . dapagliflozin propanediol (FARXIGA) 10 MG TABS tablet Take 10 mg by mouth daily.   . folic acid (FOLVITE) 1 MG tablet Take 1 mg by mouth.   . hydrochlorothiazide (HYDRODIURIL) 25 MG tablet Take 25 mg by mouth daily.   . metFORMIN (GLUCOPHAGE) 1000 MG tablet TAKE 1 TABLET TWICE A DAY WITH A MEAL   . methotrexate 2.5 MG tablet Take 2.5 mg by mouth 2 (two) times a week. 01/10/2017: Takes 2 tablets once a week  . predniSONE (DELTASONE) 20 MG tablet Take 20 mg by mouth daily with breakfast. 03/15/2017: Currently on 613m(tapering down)  . [DISCONTINUED] methotrexate (RHEUMATREX) 2.5 MG tablet Take 5 mg by mouth.   . Marland Kitchenbuprofen (ADVIL,MOTRIN) 800 MG tablet Take  1 tablet (800 mg total) by mouth every 8 (eight) hours as needed. (Patient not taking: Reported on 03/15/2017)   . [DISCONTINUED] albuterol (PROVENTIL) (2.5 MG/3ML) 0.083% nebulizer solution Take 2.5 mg by nebulization every 4 (four) hours as needed for wheezing or shortness of breath. Reported on 11/26/2015   . [DISCONTINUED] FOLIC ACID PO Take by mouth.    No facility-administered encounter medications on file as of 03/15/2017.     Allergies reviewed  ROS: The patient denies anorexia, weight changes, headaches,  vision changes, decreased hearing, ear pain, sore throat, breast concerns, chest pain, palpitations, dizziness, syncope, dyspnea on exertion, cough, swelling, nausea, vomiting, diarrhea, abdominal pain, melena, hematochezia, indigestion/heartburn, hematuria, dysuria,vaginal bleeding, vaginal discharge, odor or itch, genital lesions, joint pains, numbness, tingling, weakness, tremor, suspicious skin lesions, depression, anxiety, abnormal bleeding/bruising, or enlarged lymph nodes. Having some urinary incontinence at night.  Frequent urination during the day; drinks a lot of water throughout the day, but denies  excessive thirst. No dysuria. Chronic microscopic hematuria. Recent left lower back pain (resolved x 1-2 d), fever, nausea and fatigue x 1 d. Nausea resolved. +mild constipation. No recent hemorrhoid bleeding.    PHYSICAL EXAM:  BP 114/72 (BP Location: Left Arm, Patient Position: Sitting, Cuff Size: Normal)   Pulse 88   Temp 98.9 F (37.2 C) (Tympanic)   Ht 5\' 3"  (1.6 m)   Wt 197 lb 6.4 oz (89.5 kg)   LMP 10/09/2015   BMI 34.97 kg/m    Wt Readings from Last 3 Encounters:  03/15/17 197 lb 6.4 oz (89.5 kg)  01/12/17 201 lb 12.8 oz (91.5 kg)  01/10/17 201 lb 3.2 oz (91.3 kg)     General Appearance:    Alert, cooperative, no distress, appears stated age  Head:    Normocephalic, without obvious abnormality, atraumatic  Eyes:    PERRL, conjunctiva/corneas clear, EOM's intact, fundi benign. Slight proptotic appearance of eyes, chronic  Ears:    Normal TM's and external ear canals  Nose:   Nares normal, mucosa normal, no drainage or sinus tenderness  Throat:   Lips, mucosa, and tongue normal; teeth and gums normal  Neck:   Supple, no lymphadenopathy;  thyroid:  no   enlargement/tenderness/nodules; no carotid bruit or JVD  Back:    Spine nontender, no curvature, ROM normal, no CVA tenderness  Lungs:     Clear to auscultation bilaterally without wheezes, rales or ronchi; respirations unlabored  Chest Wall:    No tenderness or deformity   Heart:    Regular rate and rhythm, S1 and S2 normal, no murmur, rub or gallop  Breast Exam:    Deferred to GYN  Abdomen:     Soft, non-tender, nondistended, normoactive bowel sounds, no masses, no hepatosplenomegaly  Genitalia:    Deferred to GYN     Extremities:   No clubbing, cyanosis or edema. +bunions bilaterally  Pulses:   2+ and symmetric all extremities  Skin:   Skin color, texture, turgor normal, no rashes or lesions  Lymph nodes:   Cervical, supraclavicular, and axillary nodes normal  Neurologic:   CNII-XII intact, normal strength,  sensation and gait; reflexes 2+ and symmetric throughout          Psych:   Normal mood, affect, hygiene and grooming   Normal diabetic foot exam Urine: mod blood (chronic), 100 glu, trace protein  A1c 10.2%\ Random glucose 372   ASSESSMENT/PLAN:  Annual physical exam - Plan: POCT Urinalysis DIP (Proadvantage Device), Visual acuity screening  Uncontrolled type  2 diabetes mellitus without complication, without long-term current use of insulin ([place]) - uncontrolled, on high dose prednisone, poor diet. Educated some. Needs insulin--given educational needs, refer to endo ([facility], sees other [facility] specialists - Plan: HgB A1c, Microalbumin / creatinine urine ratio, Ambulatory referral to Endocrinology, Glucose (CBG)  Pure hypercholesterolemia - at goal per last check; continue statin  Vitamin D deficiency - adequately replaced on last check; continue 2000 IU daily  CKD (chronic kidney disease) stage 2, GFR 60-89 ml/min  Essential hypertension, benign - controlled  Relapsing polychondritis - currently on 46m prednisone   Flu shot and pneumovax (since she is <50) were highly recommended.  Risks/side effects and reasons for giving discussed at length--she still declined.  Encouraged her to discuss with pulm at her OV next month.  Diabetic foot exam--normal Urine microalb   Normal CBC (WBC 12.9, o/w normal), LFT's, [place] Also normal 01/2017  A1c and urine microalb today.  c-met not needed, [place]  Refer to Dr. AElyse Hsufor control of DM, given the need for significant education, new insulin start, and that her other specialists are in [facility] (she also lives near his office)   Discussed monthly self breast exams and yearly mammograms; at least 30 minutes of aerobic activity at least 5 days/week, weight-bearing exercise at least 2x/wk; proper sunscreen use reviewed; healthy diet, including goals of calcium and vitamin D intake and alcohol  recommendations (less than or equal to 1 drink/day) reviewed; regular seatbelt use; changing batteries in smoke detectors.  Immunization recommendations discussed--flu shots yearly, pneumovax recommended--she refuses both; Shingrix age 51 Td (vs Tdap) due 12/2017.  Colonoscopy recommendations reviewed, UTD, due again 2028.  F/u 6 months, sooner prn To contact uKoreaif endo appt not scheduled Form filled out stating she has physical and is not contagious. Negative PPD.

## 2017-03-15 ENCOUNTER — Ambulatory Visit (INDEPENDENT_AMBULATORY_CARE_PROVIDER_SITE_OTHER): Payer: 59 | Admitting: Family Medicine

## 2017-03-15 ENCOUNTER — Encounter: Payer: Self-pay | Admitting: Family Medicine

## 2017-03-15 VITALS — BP 114/72 | HR 88 | Temp 98.9°F | Ht 63.0 in | Wt 197.4 lb

## 2017-03-15 DIAGNOSIS — E7800 Pure hypercholesterolemia, unspecified: Secondary | ICD-10-CM

## 2017-03-15 DIAGNOSIS — N182 Chronic kidney disease, stage 2 (mild): Secondary | ICD-10-CM | POA: Diagnosis not present

## 2017-03-15 DIAGNOSIS — E559 Vitamin D deficiency, unspecified: Secondary | ICD-10-CM | POA: Diagnosis not present

## 2017-03-15 DIAGNOSIS — IMO0001 Reserved for inherently not codable concepts without codable children: Secondary | ICD-10-CM

## 2017-03-15 DIAGNOSIS — M941 Relapsing polychondritis: Secondary | ICD-10-CM

## 2017-03-15 DIAGNOSIS — I10 Essential (primary) hypertension: Secondary | ICD-10-CM | POA: Diagnosis not present

## 2017-03-15 DIAGNOSIS — E1165 Type 2 diabetes mellitus with hyperglycemia: Secondary | ICD-10-CM | POA: Diagnosis not present

## 2017-03-15 DIAGNOSIS — Z0000 Encounter for general adult medical examination without abnormal findings: Secondary | ICD-10-CM

## 2017-03-15 LAB — POCT URINALYSIS DIP (PROADVANTAGE DEVICE)
BILIRUBIN UA: NEGATIVE
BILIRUBIN UA: NEGATIVE mg/dL
Leukocytes, UA: NEGATIVE
Nitrite, UA: NEGATIVE
PH UA: 6 (ref 5.0–8.0)
SPECIFIC GRAVITY, URINE: 1.03
UUROB: NEGATIVE

## 2017-03-15 LAB — POCT GLYCOSYLATED HEMOGLOBIN (HGB A1C): Hemoglobin A1C: 10.2

## 2017-03-15 LAB — GLUCOSE, POCT (MANUAL RESULT ENTRY): POC GLUCOSE: 372 mg/dL — AB (ref 70–99)

## 2017-03-15 NOTE — Patient Instructions (Addendum)
  HEALTH MAINTENANCE RECOMMENDATIONS:  It is recommended that you get at least 30 minutes of aerobic exercise at least 5 days/week (for weight loss, you may need as much as 60-90 minutes). This can be any activity that gets your heart rate up. This can be divided in 10-15 minute intervals if needed, but try and build up your endurance at least once a week.  Weight bearing exercise is also recommended twice weekly.  Eat a healthy diet with lots of vegetables, fruits and fiber.  "Colorful" foods have a lot of vitamins (ie green vegetables, tomatoes, red peppers, etc).  Limit sweet tea, regular sodas and alcoholic beverages, all of which has a lot of calories and sugar.  Up to 1 alcoholic drink daily may be beneficial for women (unless trying to lose weight, watch sugars).  Drink a lot of water.  Calcium recommendations are 1200-1500 mg daily (1500 mg for postmenopausal women or women without ovaries), and vitamin D 1000 IU daily.  This should be obtained from diet and/or supplements (vitamins), and calcium should not be taken all at once, but in divided doses.  Monthly self breast exams and yearly mammograms for women over the age of 21 is recommended.  Sunscreen of at least SPF 30 should be used on all sun-exposed parts of the skin when outside between the hours of 10 am and 4 pm (not just when at beach or pool, but even with exercise, golf, tennis, and yard work!)  Use a sunscreen that says "broad spectrum" so it covers both UVA and UVB rays, and make sure to reapply every 1-2 hours.  Remember to change the batteries in your smoke detectors when changing your clock times in the spring and fall.  Use your seat belt every time you are in a car, and please drive safely and not be distracted with cell phones and texting while driving.  I highly recommend that you get a flu shot and a pneumonia vaccine.  You declined these today.  I would like for you to reconsider, and to discuss with your pulmonary  doctor at your visit next month.  If you get them (or have already gotten a pneumonia vaccine), please let us know the dates so we can enter it into your chart.  We are referring you to an endocrinologist for your diabetes which is out of control. Stop drinking sugary drinks, cut back on carbs and sugar in your diet. You will need to be put on insulin to help control your sugars to avoid complications of diabetes.

## 2017-03-16 LAB — MICROALBUMIN / CREATININE URINE RATIO
Creatinine, Urine: 203 mg/dL (ref 20–275)
MICROALB UR: 1.7 mg/dL
MICROALB/CREAT RATIO: 8 ug/mg{creat} (ref <30)

## 2017-04-06 ENCOUNTER — Telehealth: Payer: Self-pay | Admitting: Family Medicine

## 2017-04-06 DIAGNOSIS — E1165 Type 2 diabetes mellitus with hyperglycemia: Principal | ICD-10-CM

## 2017-04-06 DIAGNOSIS — IMO0001 Reserved for inherently not codable concepts without codable children: Secondary | ICD-10-CM

## 2017-04-06 MED ORDER — DAPAGLIFLOZIN PROPANEDIOL 10 MG PO TABS: 10.0000 mg | ORAL_TABLET | Freq: Every day | ORAL | 0 refills | Status: DC

## 2017-04-06 NOTE — Telephone Encounter (Signed)
[facility] is requesting 90 refill on farxgia 10mg .

## 2017-04-06 NOTE — Telephone Encounter (Signed)
Done

## 2017-04-19 ENCOUNTER — Encounter: Payer: Self-pay | Admitting: Family Medicine

## 2017-04-19 ENCOUNTER — Ambulatory Visit: Payer: 59 | Admitting: Family Medicine

## 2017-04-19 ENCOUNTER — Ambulatory Visit (INDEPENDENT_AMBULATORY_CARE_PROVIDER_SITE_OTHER): Payer: 59 | Admitting: Family Medicine

## 2017-04-19 VITALS — BP 120/70 | HR 86 | Wt 193.8 lb

## 2017-04-19 DIAGNOSIS — E876 Hypokalemia: Secondary | ICD-10-CM

## 2017-04-19 DIAGNOSIS — R197 Diarrhea, unspecified: Secondary | ICD-10-CM | POA: Diagnosis not present

## 2017-04-19 LAB — BASIC METABOLIC PANEL
BUN: 12 mg/dL (ref 7–25)
CALCIUM: 10 mg/dL (ref 8.6–10.2)
CHLORIDE: 98 mmol/L (ref 98–110)
CO2: 29 mmol/L (ref 20–32)
Creat: 0.96 mg/dL (ref 0.50–1.10)
GLUCOSE: 249 mg/dL — AB (ref 65–99)
POTASSIUM: 3.4 mmol/L — AB (ref 3.5–5.3)
SODIUM: 139 mmol/L (ref 135–146)

## 2017-04-19 LAB — MAGNESIUM: Magnesium: 1.9 mg/dL (ref 1.5–2.5)

## 2017-04-19 MED ORDER — POTASSIUM CHLORIDE CRYS ER 20 MEQ PO TBCR: 20.0000 meq | EXTENDED_RELEASE_TABLET | Freq: Every day | ORAL | 0 refills | Status: DC

## 2017-04-19 NOTE — Patient Instructions (Addendum)
Start taking the potassium supplement as discussed and we will call you with your lab results to determine if you need to continue taking this.   For diarrhea - stay well hydrated. You can take over the counter Immodium as long as you do not have fever or blood in your stools.    Hypokalemia Hypokalemia means that the amount of potassium in the blood is lower than normal.Potassium is a chemical that helps regulate the amount of fluid in the body (electrolyte). It also stimulates muscle tightening (contraction) and helps nerves work properly.Normally, most of the body's potassium is inside of cells, and only a very small amount is in the blood. Because the amount in the blood is so small, minor changes to potassium levels in the blood can be life-threatening. What are the causes? This condition may be caused by:  Antibiotic medicine.  Diarrhea or vomiting. Taking too much of a medicine that helps you have a bowel movement (laxative) can cause diarrhea and lead to hypokalemia.  Chronic kidney disease (CKD).  Medicines that help the body get rid of excess fluid (diuretics).  Eating disorders, such as bulimia.  Low magnesium levels in the body.  Sweating a lot.  What are the signs or symptoms? Symptoms of this condition include:  Weakness.  Constipation.  Fatigue.  Muscle cramps.  Mental confusion.  Skipped heartbeats or irregular heartbeat (palpitations).  Tingling or numbness.  How is this diagnosed? This condition is diagnosed with a blood test. How is this treated? Hypokalemia can be treated by taking potassium supplements by mouth or adjusting the medicines that you take. Treatment may also include eating more foods that contain a lot of potassium. If your potassium level is very low, you may need to get potassium through an IV tube in one of your veins and be monitored in the hospital. Follow these instructions at home:  Take over-the-counter and prescription  medicines only as told by your health care provider. This includes vitamins and supplements.  Eat a healthy diet. A healthy diet includes fresh fruits and vegetables, whole grains, healthy fats, and lean proteins.  If instructed, eat more foods that contain a lot of potassium, such as: ? Nuts, such as peanuts and pistachios. ? Seeds, such as sunflower seeds and pumpkin seeds. ? Peas, lentils, and lima beans. ? Whole grain and bran cereals and breads. ? Fresh fruits and vegetables, such as apricots, avocado, bananas, cantaloupe, kiwi, oranges, tomatoes, asparagus, and potatoes. ? Orange juice. ? Tomato juice. ? Red meats. ? Yogurt.  Keep all follow-up visits as told by your health care provider. This is important. Contact a health care provider if:  You have weakness that gets worse.  You feel your heart pounding or racing.  You vomit.  You have diarrhea.  You have diabetes (diabetes mellitus) and you have trouble keeping your blood sugar (glucose) in your target range. Get help right away if:  You have chest pain.  You have shortness of breath.  You have vomiting or diarrhea that lasts for more than 2 days.  You faint. This information is not intended to replace advice given to you by your health care provider. Make sure you discuss any questions you have with your health care provider. Document Released: 06/21/2005 Document Revised: 02/07/2016 Document Reviewed: 02/07/2016 Elsevier Interactive Patient Education  2018 [employer].

## 2017-04-19 NOTE — Progress Notes (Signed)
   Subjective:    Patient ID: Michelle Mack, female    DOB: 10/16/1965, 51 y.o.   MRN: [id number]  HPI Chief Complaint  Patient presents with  . potassium is low    had labs done by [facility]. Endocrinologist ordered these labs   She is here to discuss a low potassium result at her endocrinologist office yesterday. Potassium 3.1 per lab results. Denies history of hypokalemia.    States she started having diarrhea today. Stools are loose and watery. No blood or pus. States she has had 6 stools today. Has not taken anything. Denies abdominal pain, N/V.   No muscle weakness or cramping.  States FBS 141 this morning.   Denies fever, chills, dizziness, chest pain, palpitations, shortness of breath, urinary symptoms, LE edema.   No new medications. States she has been taking HCTZ for "years".   She has a f/u appt with her endocrinologist on October 29th.   Reviewed allergies, medications, past medical, surgical, and social history.  Review of Systems Pertinent positives and negatives in the history of present illness.     Objective:   Physical Exam  Constitutional: She is oriented to person, place, and time. She appears well-developed and well-nourished. No distress.  HENT:  Mouth/Throat: Uvula is midline, oropharynx is clear and moist and mucous membranes are normal.  Eyes: Pupils are equal, round, and reactive to light. Conjunctivae and lids are normal.  Neck: Normal range of motion. Neck supple.  Cardiovascular: Normal rate, regular rhythm, normal heart sounds and normal pulses.   Pulmonary/Chest: Effort normal and breath sounds normal.  Abdominal: Soft. Normal appearance. Bowel sounds are increased. There is no hepatosplenomegaly. There is generalized tenderness. There is no rigidity, no rebound, no guarding, no CVA tenderness, no tenderness at McBurney's point and negative Murphy's sign.  Musculoskeletal: Normal range of motion.  Lymphadenopathy:    She has no cervical  adenopathy.  Neurological: She is alert and oriented to person, place, and time. She has normal strength. No cranial nerve deficit or sensory deficit. Gait normal.  Skin: Skin is warm and dry. No pallor.  Psychiatric: She has a normal mood and affect. Her speech is normal and behavior is normal. Thought content normal.   BP 120/70   Pulse 86   Wt 193 lb 12.8 oz (87.9 kg)   LMP 10/09/2015   BMI 34.33 kg/m       Assessment & Plan:  Hypokalemia - Plan: Basic metabolic panel, Magnesium  Diarrhea, unspecified type  Discussed that she has mild hypokalemia based on lab results from Dr. Altheimer's office.  Diarrhea did not start until this morning and does not explanation low K.  Will repeat labs, BMP and magnesium.  Prescription sent for potassium supplement 20 MEQ. She may take one dose this evening or wait until we call her tomorrow to confirm potassium level.  No obvious explanation for diarrhea. She does not appear infectious and no red flags.  She may try Immodium. Advised her to stay well hydrated.  She has been taking HCTZ 25 mg for "years" per patient. I will discuss with Dr. Tomi Bamberger, her PCP, after lab confirmation.

## 2017-04-20 ENCOUNTER — Other Ambulatory Visit: Payer: Self-pay | Admitting: Family Medicine

## 2017-04-20 DIAGNOSIS — E876 Hypokalemia: Secondary | ICD-10-CM

## 2017-04-20 NOTE — Progress Notes (Signed)
   Subjective:    Patient ID: Michelle Mack, female    DOB: 07/27/1965, 51 y.o.   MRN: [id number]  HPI    Review of Systems     Objective:   Physical Exam        Assessment & Plan:

## 2017-04-28 ENCOUNTER — Other Ambulatory Visit: Payer: Self-pay | Admitting: Family Medicine

## 2017-04-28 DIAGNOSIS — E119 Type 2 diabetes mellitus without complications: Secondary | ICD-10-CM

## 2017-05-02 DIAGNOSIS — Z7952 Long term (current) use of systemic steroids: Secondary | ICD-10-CM | POA: Insufficient documentation

## 2017-05-16 ENCOUNTER — Other Ambulatory Visit: Payer: Self-pay | Admitting: Family Medicine

## 2017-06-05 ENCOUNTER — Other Ambulatory Visit: Payer: Self-pay | Admitting: Family Medicine

## 2017-06-05 DIAGNOSIS — I10 Essential (primary) hypertension: Secondary | ICD-10-CM

## 2017-06-15 ENCOUNTER — Other Ambulatory Visit: Payer: Self-pay | Admitting: Family Medicine

## 2017-06-20 ENCOUNTER — Other Ambulatory Visit: Payer: Self-pay | Admitting: Family Medicine

## 2017-06-20 DIAGNOSIS — E7800 Pure hypercholesterolemia, unspecified: Secondary | ICD-10-CM

## 2017-06-29 ENCOUNTER — Other Ambulatory Visit: Payer: Self-pay | Admitting: Family Medicine

## 2017-06-30 ENCOUNTER — Encounter: Payer: Self-pay | Admitting: Family Medicine

## 2017-06-30 ENCOUNTER — Ambulatory Visit: Payer: 59 | Admitting: Family Medicine

## 2017-06-30 VITALS — BP 110/70 | HR 91 | Ht 64.0 in | Wt 188.0 lb

## 2017-06-30 DIAGNOSIS — I10 Essential (primary) hypertension: Secondary | ICD-10-CM | POA: Diagnosis not present

## 2017-06-30 DIAGNOSIS — F439 Reaction to severe stress, unspecified: Secondary | ICD-10-CM

## 2017-06-30 DIAGNOSIS — R0789 Other chest pain: Secondary | ICD-10-CM | POA: Diagnosis not present

## 2017-06-30 DIAGNOSIS — R079 Chest pain, unspecified: Secondary | ICD-10-CM | POA: Diagnosis not present

## 2017-06-30 NOTE — Progress Notes (Signed)
   Subjective:    Patient ID: [name], female    DOB: [birth year], 51 y.o.   MRN: [id number]  HPI Chief Complaint  Patient presents with  . Acute Visit    when she gets upset feels like she is going to have a heartattack, head hurts, left back to chest.   She is here with complaints of intermittent chest pain, headache, and feeling like she is going to pass out when she gets angry and raises her voice. States she noticed this happening on Christmas Day at work. States it happened again this morning at home when she got upset at her daughter.   States chest pain was a "squeezing" sensation in her chest that lasted for 3-4 minutes when she was upset and then resolved when she calmed down.  Denies chest pain today.   Feels fine otherwise. Denies palpitations, shortness of breath, wheezing, abdominal pain, N/V/D. No vision changes, increased thirst or urinary frequency.   Reports that she has always been short tempered and irritable. Denies any new or worsening issues with her mood.   Last Hgb A1c 10.2 and she has been seeing an endocrinologist recently. States her blood sugars have improved. Reports FBS in 120 range.    Review of Systems Pertinent positives and negatives in the history of present illness.      Objective:   Physical Exam BP 110/70 (BP Location: Right Arm, Patient Position: Sitting)   Pulse 91   Ht 5\' 4"  (1.626 m)   Wt 188 lb (85.3 kg)   LMP 10/09/2015   SpO2 98%   BMI 32.27 kg/m   Alert and in no distress.  Pharyngeal area is normal. Neck is supple without adenopathy or thyromegaly. Cardiac exam shows a regular sinus rhythm without murmurs or gallops. Lungs are clear to auscultation. Extremities without edema. Skin warm and dry, no pallor. PERRLA, EOMs intact, CN II-IX intact.       Assessment & Plan:  Intermittent chest pain - Plan: Basic metabolic panel, CBC with Differential/Platelet, EKG 12-Lead  Stress  Chest wall pain  Essential hypertension  - Plan: Basic metabolic panel  ECG shows a NSR, normal rate and no acute changes. Discussed that her symptoms do not appear to be related to a cardiac etiology.  Discussed anger management and coping mechanism for stress.  Will recheck labs, she did have mild hypokalemia in the past.  Follow up with Dr. Tomi Bamberger as needed for persistent symptoms.

## 2017-07-01 LAB — BASIC METABOLIC PANEL
BUN: 16 mg/dL (ref 7–25)
CALCIUM: 10.2 mg/dL (ref 8.6–10.2)
CO2: 34 mmol/L — ABNORMAL HIGH (ref 20–32)
Chloride: 99 mmol/L (ref 98–110)
Creat: 1.09 mg/dL (ref 0.50–1.10)
GLUCOSE: 167 mg/dL — AB (ref 65–99)
Potassium: 3.9 mmol/L (ref 3.5–5.3)
SODIUM: 142 mmol/L (ref 135–146)

## 2017-07-01 LAB — CBC WITH DIFFERENTIAL/PLATELET
BASOS ABS: 20 {cells}/uL (ref 0–200)
BASOS PCT: 0.2 %
EOS PCT: 0.1 %
Eosinophils Absolute: 10 cells/uL — ABNORMAL LOW (ref 15–500)
HEMATOCRIT: 35.6 % (ref 35.0–45.0)
HEMOGLOBIN: 11.9 g/dL (ref 11.7–15.5)
LYMPHS ABS: 1519 {cells}/uL (ref 850–3900)
MCH: 28.5 pg (ref 27.0–33.0)
MCHC: 33.4 g/dL (ref 32.0–36.0)
MCV: 85.4 fL (ref 80.0–100.0)
MPV: 9.5 fL (ref 7.5–12.5)
Monocytes Relative: 5 %
NEUTROS ABS: 7762 {cells}/uL (ref 1500–7800)
Neutrophils Relative %: 79.2 %
Platelets: 355 10*3/uL (ref 140–400)
RBC: 4.17 10*6/uL (ref 3.80–5.10)
RDW: 12.8 % (ref 11.0–15.0)
Total Lymphocyte: 15.5 %
WBC mixed population: 490 cells/uL (ref 200–950)
WBC: 9.8 10*3/uL (ref 3.8–10.8)

## 2017-07-24 ENCOUNTER — Other Ambulatory Visit: Payer: Self-pay | Admitting: Family Medicine

## 2017-07-24 DIAGNOSIS — IMO0001 Reserved for inherently not codable concepts without codable children: Secondary | ICD-10-CM

## 2017-07-24 DIAGNOSIS — E1165 Type 2 diabetes mellitus with hyperglycemia: Principal | ICD-10-CM

## 2017-07-29 ENCOUNTER — Other Ambulatory Visit: Payer: Self-pay | Admitting: Family Medicine

## 2017-08-02 ENCOUNTER — Ambulatory Visit: Payer: BLUE CROSS/BLUE SHIELD | Admitting: Sports Medicine

## 2017-08-02 ENCOUNTER — Encounter: Payer: Self-pay | Admitting: Sports Medicine

## 2017-08-02 DIAGNOSIS — E119 Type 2 diabetes mellitus without complications: Secondary | ICD-10-CM | POA: Diagnosis not present

## 2017-08-02 DIAGNOSIS — M79675 Pain in left toe(s): Secondary | ICD-10-CM

## 2017-08-02 DIAGNOSIS — L600 Ingrowing nail: Secondary | ICD-10-CM | POA: Diagnosis not present

## 2017-08-02 NOTE — Patient Instructions (Signed)
Soak Instructions    THE DAY AFTER THE PROCEDURE  Place 1/4 cup of epsom salts in a quart of warm tap water.  Submerge your foot or feet with outer bandage intact for the initial soak; this will allow the bandage to become moist and wet for easy lift off.  Once you remove your bandage, continue to soak in the solution for 20 minutes.  This soak should be done twice a day.  Next, remove your foot or feet from solution, blot dry the affected area and cover.  You may use a band aid large enough to cover the area or use gauze and tape.  Apply other medications to the area as directed by the doctor such as polysporin neosporin.  IF YOUR SKIN BECOMES IRRITATED WHILE USING THESE INSTRUCTIONS, IT IS OKAY TO SWITCH TO  WHITE VINEGAR AND WATER. Or you may use antibacterial soap and water to keep the toe clean  Monitor for any signs/symptoms of infection. Call the office immediately if any occur or go directly to the emergency room. Call with any questions/concerns.    Long Term Care Instructions-Post Nail Surgery  You have had your ingrown toenail and root treated with a chemical.  This chemical causes a burn that will drain and ooze like a blister.  This can drain for 6-8 weeks or longer.  It is important to keep this area clean, covered, and follow the soaking instructions dispensed at the time of your surgery.  This area will eventually dry and form a scab.  Once the scab forms you no longer need to soak or apply a dressing.  If at any time you experience an increase in pain, redness, swelling, or drainage, you should contact the office as soon as possible.    Betadine Soak Instructions  Purchase an 8 oz. bottle of BETADINE solution (Povidone)  THE DAY AFTER THE PROCEDURE  Place 1 tablespoon of betadine solution in a quart of warm tap water.  Submerge your foot or feet with outer bandage intact for the initial soak; this will allow the bandage to become moist and wet for easy lift off.  Once you  remove your bandage, continue to soak in the solution for 20 minutes.  This soak should be done twice a day.  Next, remove your foot or feet from solution, blot dry the affected area and cover.  You may use a band aid large enough to cover the area or use gauze and tape.  Apply other medications to the area as directed by the doctor such as cortisporin otic solution (ear drops) or neosporin.  IF YOUR SKIN BECOMES IRRITATED WHILE USING THESE INSTRUCTIONS, IT IS OKAY TO SWITCH TO EPSOM SALTS AND WATER OR WHITE VINEGAR AND WATER.  Long Term Care Instructions-Post Nail Surgery  You have had your ingrown toenail and root treated with a chemical.  This chemical causes a burn that will drain and ooze like a blister.  This can drain for 6-8 weeks or longer.  It is important to keep this area clean, covered, and follow the soaking instructions dispensed at the time of your surgery.  This area will eventually dry and form a scab.  Once the scab forms you no longer need to soak or apply a dressing.  If at any time you experience an increase in pain, redness, swelling, or drainage, you should contact the office as soon as possible. 

## 2017-08-02 NOTE — Progress Notes (Signed)
Subjective: Michelle Mack is a 52 y.o. Diabetic female patient presents to office today complaining of a moderately painful incurvated,medial nail border of the 1st toe on the left foot. This has been present for 3 weeks. Patient has treated this by self trimming. Patient denies fever/chills/nausea/vomitting/any other related constitutional symptoms at this time.  A1c 7 per patient report.   Patient Active Problem List   Diagnosis Date Noted  . 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 Fibroid, uterine 11/13/2015  . Mediastinal adenopathy 10/23/2015  . Collapse of left lung 10/23/2015  . Relapsing polychondritis 10/23/2015  . Type 2 diabetes mellitus, controlled, with renal complications ([place]) 41/32/4401  . CKD (chronic kidney disease) stage 2, GFR 60-89 ml/min 11/06/2014  . Type 2 diabetes mellitus without complication ([place]) 02/72/5366  . Essential hypertension 05/22/2014  . Chronic polychondritis 05/02/2014  . Dysfunctional uterine bleeding 04/29/2014  . Obstructive apnea 04/29/2014  . Other specified respiratory disorders 04/29/2014  . Microscopic hematuria 04/12/2014  . 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 Positive ANA (antinuclear antibody) 02/26/2014  . Vitamin D deficiency 07/31/2013  . Obesity (BMI 30-39.9)   . Impaired fasting glucose 05/24/2012  . Fibroid uterus 05/24/2012  . Infertility, female   . DUB (dysfunctional uterine bleeding)   . Chronic cough 05/25/2011  . GERD (gastroesophageal reflux disease) 05/25/2011  . Anemia 05/25/2011  . Essential hypertension, benign 04/05/2011    Current Outpatient Medications on File Prior to Visit  Medication Sig Dispense Refill  . amLODipine (NORVASC) 5 MG tablet TAKE 1 TABLET BY MOUTH EVERY DAY 90 tablet 0  . atorvastatin (LIPITOR) 20 MG tablet TAKE 1 TABLET BY MOUTH EVERY DAY 90 tablet 0  . cholecalciferol (VITAMIN D) 1000 units tablet Take 1,000 Units by mouth daily.    Marland Kitchen FARXIGA 10 MG TABS tablet TAKE 1 TABLET BY MOUTH EVERY DAY 90  tablet 0  . folic acid (FOLVITE) 1 MG tablet Take 1 mg by mouth.    . hydrochlorothiazide (HYDRODIURIL) 25 MG tablet Take 25 mg by mouth daily.  3  . KLOR-CON M20 20 MEQ tablet TAKE 1 TABLET BY MOUTH EVERY DAY 30 tablet 0  . KLOR-CON M20 20 MEQ tablet TAKE 1 TABLET BY MOUTH EVERY DAY 30 tablet 0  . metFORMIN (GLUCOPHAGE) 1000 MG tablet TAKE 1 TABLET TWICE A DAY WITH A MEAL 180 tablet 1  . methotrexate 2.5 MG tablet Take 2.5 mg by mouth 2 (two) times a week.    . predniSONE (DELTASONE) 20 MG tablet Take 5 mg by mouth daily with breakfast.      No current facility-administered medications on file prior to visit.     Allergies in chart  Objective:  There were no vitals filed for this visit.  General: Well developed, nourished, in no acute distress, alert and oriented x3   Dermatology: Skin is warm, dry and supple bilateral. Left hallux nail appears to be  severely incurvated with hyperkeratosis formation at the distal aspects of  the medial nail border. (-) Erythema. (+) Edema. (-) serosanguous  drainage present. The remaining nails appear unremarkable at this time. There are no open sores, lesions or other signs of infection present.  Vascular: Dorsalis Pedis artery and Posterior Tibial artery pedal pulses are 2/4 bilateral with immedate capillary fill time. Pedal hair growth present. No lower extremity edema.   Neruologic: Grossly intact via light touch bilateral.  Musculoskeletal: Tenderness to palpation of the Left hallux medial nail fold.+Bunion. Muscular strength within normal limits in all groups bilateral.   Assesement  and Plan: Problem List Items Addressed This Visit    None    Visit Diagnoses    Ingrown nail    -  Primary   Great toe pain, left       Diabetes mellitus without complication ([place])          -Discussed treatment alternatives and plan of care; Explained permanent/temporary nail avulsion and post procedure course to patient. Patient opt for PNA.  - After a  verbal consent, injected 3 ml of a 50:50 mixture of 2% plain  lidocaine and 0.5% plain marcaine in a normal hallux block fashion. Next, a  betadine prep was performed. Anesthesia was tested and found to be appropriate.  The offending Left hallux medial nail border was then incised from the hyponychium to the epinychium. The offending nail border was removed and cleared from the field. The area was curretted for any remaining nail or spicules. Phenol application performed and the area was then flushed with alcohol and dressed with antibiotic cream and a dry sterile dressing. -Patient was instructed to leave the dressing intact for today and begin soaking  in a weak solution of betadine or Epsom salt and water tomorrow. Patient was instructed to  soak for 15 minutes each day and apply neosporin and a gauze or bandaid dressing each day. -Patient was instructed to monitor the toe for signs of infection and return to office if toe becomes red, hot or swollen. -Advised ice, elevation, and tylenol or motrin if needed for pain.  -Work excuse given; No work today, may resume normal schedule tomorrow -Patient is to return in 2 weeks for follow up care/nail check or sooner if problems arise.  Landis Martins, DPM

## 2017-08-16 ENCOUNTER — Ambulatory Visit: Payer: BLUE CROSS/BLUE SHIELD | Admitting: Sports Medicine

## 2017-08-16 ENCOUNTER — Ambulatory Visit: Payer: BLUE CROSS/BLUE SHIELD | Admitting: Family Medicine

## 2017-08-16 ENCOUNTER — Encounter: Payer: Self-pay | Admitting: Sports Medicine

## 2017-08-16 DIAGNOSIS — Z9889 Other specified postprocedural states: Secondary | ICD-10-CM

## 2017-08-16 DIAGNOSIS — L600 Ingrowing nail: Secondary | ICD-10-CM

## 2017-08-16 DIAGNOSIS — E119 Type 2 diabetes mellitus without complications: Secondary | ICD-10-CM

## 2017-08-16 DIAGNOSIS — M79675 Pain in left toe(s): Secondary | ICD-10-CM

## 2017-08-16 NOTE — Patient Instructions (Signed)

## 2017-08-17 ENCOUNTER — Ambulatory Visit: Payer: BLUE CROSS/BLUE SHIELD | Admitting: Family Medicine

## 2017-08-17 ENCOUNTER — Encounter: Payer: Self-pay | Admitting: Sports Medicine

## 2017-08-17 ENCOUNTER — Encounter: Payer: Self-pay | Admitting: Family Medicine

## 2017-08-17 VITALS — BP 120/70 | HR 77 | Temp 98.8°F

## 2017-08-17 DIAGNOSIS — R319 Hematuria, unspecified: Secondary | ICD-10-CM

## 2017-08-17 DIAGNOSIS — R300 Dysuria: Secondary | ICD-10-CM | POA: Diagnosis not present

## 2017-08-17 LAB — POCT URINALYSIS DIP (PROADVANTAGE DEVICE)
BILIRUBIN UA: NEGATIVE
BILIRUBIN UA: NEGATIVE mg/dL
Glucose, UA: NEGATIVE mg/dL
LEUKOCYTES UA: NEGATIVE
Nitrite, UA: NEGATIVE
PH UA: 6 (ref 5.0–8.0)
PROTEIN UA: NEGATIVE mg/dL
Specific Gravity, Urine: 1.025
Urobilinogen, Ur: NEGATIVE

## 2017-08-17 MED ORDER — CIPROFLOXACIN HCL 500 MG PO TABS: 500.0000 mg | ORAL_TABLET | Freq: Two times a day (BID) | ORAL | 0 refills | Status: DC

## 2017-08-17 NOTE — Progress Notes (Signed)
Subjective: Michelle Mack is a 52 y.o. diabetic female patient returns to office today for follow up evaluation after having Left Hallux medial permanent nail avulsion performed on 08/02/2017.  Patient has been soaking using Epsom salt and applying topical antibiotic covered with bandaid daily. Patient admits to some soreness that feels like it is slowly getting better.  Patient denies fever/chills/nausea/vomitting/any other related constitutional symptoms at this time.  Patient Active Problem List   Diagnosis Date Noted  . 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 Fibroid, uterine 11/13/2015  . Mediastinal adenopathy 10/23/2015  . Collapse of left lung 10/23/2015  . Relapsing polychondritis 10/23/2015  . Type 2 diabetes mellitus, controlled, with renal complications ([place]) 26/94/8546  . CKD (chronic kidney disease) stage 2, GFR 60-89 ml/min 11/06/2014  . Type 2 diabetes mellitus without complication ([place]) 27/09/5007  . Essential hypertension 05/22/2014  . Chronic polychondritis 05/02/2014  . Dysfunctional uterine bleeding 04/29/2014  . Obstructive apnea 04/29/2014  . Other specified respiratory disorders 04/29/2014  . Microscopic hematuria 04/12/2014  . 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 Positive ANA (antinuclear antibody) 02/26/2014  . Vitamin D deficiency 07/31/2013  . Obesity (BMI 30-39.9)   . Impaired fasting glucose 05/24/2012  . Fibroid uterus 05/24/2012  . Infertility, female   . DUB (dysfunctional uterine bleeding)   . Chronic cough 05/25/2011  . GERD (gastroesophageal reflux disease) 05/25/2011  . Anemia 05/25/2011  . Essential hypertension, benign 04/05/2011    Current Outpatient Medications on File Prior to Visit  Medication Sig Dispense Refill  . amLODipine (NORVASC) 5 MG tablet TAKE 1 TABLET BY MOUTH EVERY DAY 90 tablet 0  . atorvastatin (LIPITOR) 20 MG tablet TAKE 1 TABLET BY MOUTH EVERY DAY 90 tablet 0  . cholecalciferol (VITAMIN D) 1000 units tablet Take 1,000 Units by mouth  daily.    Marland Kitchen FARXIGA 10 MG TABS tablet TAKE 1 TABLET BY MOUTH EVERY DAY 90 tablet 0  . folic acid (FOLVITE) 1 MG tablet Take 1 mg by mouth.    . hydrochlorothiazide (HYDRODIURIL) 25 MG tablet Take 25 mg by mouth daily.  3  . KLOR-CON M20 20 MEQ tablet TAKE 1 TABLET BY MOUTH EVERY DAY 30 tablet 0  . KLOR-CON M20 20 MEQ tablet TAKE 1 TABLET BY MOUTH EVERY DAY 30 tablet 0  . metFORMIN (GLUCOPHAGE) 1000 MG tablet TAKE 1 TABLET TWICE A DAY WITH A MEAL 180 tablet 1  . methotrexate 2.5 MG tablet Take 2.5 mg by mouth 2 (two) times a week.    . predniSONE (DELTASONE) 20 MG tablet Take 5 mg by mouth daily with breakfast.      No current facility-administered medications on file prior to visit.     Multiple allergies see chart  Objective:  General: Well developed, nourished, in no acute distress, alert and oriented x3   Dermatology: Skin is warm, dry and supple bilateral.  Left hallux medial nail bed appears to be clean, dry, with mild granular tissue and surrounding maceration with minimal eschar/scab. (-) Erythema. (-) Edema. (-) serosanguous drainage present. The remaining nails appear unremarkable at this time. There are no other lesions or other signs of infection  present.  Neurovascular status: Intact. No lower extremity swelling; No pain with calf compression bilateral.  Musculoskeletal: Decreased tenderness to palpation of the left hallux medial nail fold. Muscular strength within normal limits bilateral.   Assesement and Plan: Problem List Items Addressed This Visit    None    Visit Diagnoses    Status post nail surgery    -  Primary   Ingrown nail       Great toe pain, left       Diabetes mellitus without complication ([place])          -Examined patient  -Cleansed left hallux medial nail fold and gently scrubbed with peroxide and q-tip/curetted away minimal eschar at site and applied antibiotic cream covered with bandaid.  -Discussed plan of care with patient. -Patient to continue  soaking in a weak solution of Epsom salt and warm water. Patient was instructed to soak for 15-20 minutes each day until the toe appears normal and there is no drainage, redness, tenderness, or swelling at the procedure site, and apply neosporin and a gauze or bandaid dressing each day as needed. May leave open to air at night. -Educated patient on long term care after nail surgery. -Patient was instructed to monitor the toe for reoccurrence and signs of infection; Patient advised to return to office or go to ER if toe becomes red, hot or swollen. -Patient is to return as needed or sooner if problems arise.  Landis Martins, DPM

## 2017-08-17 NOTE — Progress Notes (Signed)
Chief Complaint  Patient presents with  . possible UTI    symptoms started yesterday- burning,     Subjective:  Michelle Mack is a 52 y.o. female who complains of possible urinary tract infection.  She has had symptoms for 2 day.  Symptoms include burning and urinary frequency. Patient denies fever, chills, back pain, abdominal pain, N/V/D or vaginal discharge.  Last UTI was several months ago.   Using nothing for current symptoms.    Patient does not have a history of recurrent UTI. Patient does not have a history of pyelonephritis.  No other aggravating or relieving factors.  No other c/o.  Past Medical History:  Diagnosis Date  . Anemia    on iron  . Back pain    tx with ibuprofen 800mg   . Bronchitis    treated recently by abx  . Cough    non-productive  . DUB (dysfunctional uterine bleeding)   . Fibroid uterus    GYN--Dr. Rivard  . GERD (gastroesophageal reflux disease)   . Hypertension   . Infertility, female   . Polychondritis   . Recurrent upper respiratory infection (URI)    in October - tx with abx  . Type II or unspecified type diabetes mellitus without mention of complication, not stated as uncontrolled    diagnosed by Dr. Berdine Addison    ROS as in subjective  Reviewed allergies, medications, past medical, surgical, and social history.    Objective: Vitals:   08/17/17 1342  BP: 120/70  Pulse: 77  Temp: 98.8 F (37.1 C)    General appearance: alert, no distress, WD/WN, female Abdomen: +bs, soft, non tender, non distended, no masses, no hepatomegaly, no splenomegaly, no bruits Back: no CVA tenderness GU: declines      Laboratory:  Urine dipstick: moderate blood, negative otherwise.       Assessment: Burning with urination - Plan: POCT Urinalysis DIP (Proadvantage Device), ciprofloxacin (CIPRO) 500 MG tablet, Urine Culture  Hematuria, unspecified type - Plan: Urine Culture    Plan: Discussed symptoms, diagnosis, possible complications, and usual  course of illness.  Begin Cipro. Will culture urine.  History of hematuria and has been worked up by urology per patient. Advised increased water intake, can use OTC Tylenol for pain.      Call or return if worse or not improving.

## 2017-08-20 LAB — URINE CULTURE

## 2017-08-22 ENCOUNTER — Telehealth: Payer: Self-pay | Admitting: Internal Medicine

## 2017-08-22 DIAGNOSIS — R300 Dysuria: Secondary | ICD-10-CM

## 2017-08-22 MED ORDER — CIPROFLOXACIN HCL 500 MG PO TABS: 500.0000 mg | ORAL_TABLET | Freq: Two times a day (BID) | ORAL | 0 refills | Status: DC

## 2017-08-22 NOTE — Telephone Encounter (Signed)
Pt called about her lab results. Pt was notified of results and pt states she is feeling alittle better as the burning is not as bad but still has it. She would like more antibiotics. Pt ran out on Saturday.

## 2017-08-22 NOTE — Telephone Encounter (Signed)
Done  Pt was notified

## 2017-08-22 NOTE — Telephone Encounter (Signed)
Ok to refill antibiotic for additional 5 days. She will need to follow up with Dr. Tomi Bamberger if she is still having symptoms after this.

## 2017-08-29 ENCOUNTER — Other Ambulatory Visit: Payer: Self-pay | Admitting: Family Medicine

## 2017-09-08 ENCOUNTER — Encounter: Payer: Self-pay | Admitting: Family Medicine

## 2017-09-09 ENCOUNTER — Other Ambulatory Visit: Payer: Self-pay | Admitting: Family Medicine

## 2017-09-09 DIAGNOSIS — I10 Essential (primary) hypertension: Secondary | ICD-10-CM

## 2017-09-11 NOTE — Progress Notes (Signed)
PATIENT WAS NOT SEEN TODAY. SHE LEFT WITHOUT BEING SEEN. NOTES BELOW WERE DONE PRIOR TO HER VISIT, IN PREPARATION FOR HER VISIT.   Patient presents for 6 month follow-up.  She recently was seen by [name], diagnosed with UTI (culture showed proteus mirabilis) and treated with Cipro.  Hypertension:  She is currently taking 5mg  amlodipine, and HCTZ 25mg , as well as 85mEq of KCl (due to hypokalemia noted in 04/2017).  Her last K+ was still low at 3.3 on last check at endocrinologist office in December. BP's have been running She denies headaches, chest pain, palpitations, exertional dyspnea, edema She has never been on ACE/ARB She had urine microalbumin/Cr ratio of <30 per Dr. Elyse Hsu in 04/2017.  Diabetes:  She was referred to Dr. Elyse Hsu for uncontrolled DM. She is on chronic prednisone. She is on Metformin and [place]. Trulicity was recommended by Dr. Elyse Hsu at initial consult in October, but it wasn't started until her f/u appointment in January. She had reported compliance with oral meds, but her February urine dip did not show glucosuria (when she saw [name] for UTI). Last A1c was down from 10.2 in September, to 8.9% in 06/2017. She had normal foot examin 04/2017, and sees podiatrist regularly.   Last ophtho was (04/2016?? UPDATE) She had normal TSH in October  Hyperlipidemia:  She reports compliance with atorvastatin. Last lipids 04/2017 were at goal, TC 111, LDL 52, HDL 45, TG 144, chol/HDL ratio 2.5  Vitamin D deficiency: Vitamin D-OH level was 52 in 04/2017    PHYSICAL EXAM:   ASSESSMENT/PLAN:   Pneumovax (she was supposed to discuss with her pulm ,after she declined at her last CPE in Sept) Tdap due (refuses flu shots, also recommended)  Schedule diabetic eye exam (last 04/2016)  DM--?taking [place] and Trulicity?  Lipids and D controlled  Recent UTI  HTN--consider ARB,consider decrease HCTZ due to the hypokalemia

## 2017-09-12 ENCOUNTER — Encounter: Payer: Self-pay | Admitting: Family Medicine

## 2017-09-12 ENCOUNTER — Ambulatory Visit: Payer: BLUE CROSS/BLUE SHIELD | Admitting: Family Medicine

## 2017-09-12 VITALS — BP 110/70 | HR 80 | Ht 64.0 in | Wt 192.0 lb

## 2017-09-27 ENCOUNTER — Other Ambulatory Visit: Payer: Self-pay | Admitting: Family Medicine

## 2017-09-27 DIAGNOSIS — E7800 Pure hypercholesterolemia, unspecified: Secondary | ICD-10-CM

## 2017-09-27 NOTE — Telephone Encounter (Signed)
Pt was seen last week but walked out without being seen. She has an appt in September for cpe. Is this ok to refill med or do I need to call pt to schedule another med check

## 2017-09-27 NOTE — Telephone Encounter (Signed)
She needs to r/s med check.  Unless she plans to get these filled by other providers that she sees.  Her potassium was last checked by her endocrinologist in December, and it was still low. So, she is due to be seen by me to get these filled.She can have 30d supply, if needed, so she doesn't run out.

## 2017-09-28 NOTE — Telephone Encounter (Signed)
Pt is coming in April 8th for med check. I will refill meds

## 2017-10-04 ENCOUNTER — Encounter: Payer: Self-pay | Admitting: Family Medicine

## 2017-10-04 ENCOUNTER — Ambulatory Visit: Payer: BLUE CROSS/BLUE SHIELD | Admitting: Family Medicine

## 2017-10-04 VITALS — BP 112/70 | HR 86 | Temp 98.7°F | Wt 192.2 lb

## 2017-10-04 DIAGNOSIS — Z862 Personal history of diseases of the blood and blood-forming organs and certain disorders involving the immune mechanism: Secondary | ICD-10-CM | POA: Diagnosis not present

## 2017-10-04 DIAGNOSIS — R519 Headache, unspecified: Secondary | ICD-10-CM

## 2017-10-04 DIAGNOSIS — R51 Headache: Secondary | ICD-10-CM | POA: Diagnosis not present

## 2017-10-04 DIAGNOSIS — R5383 Other fatigue: Secondary | ICD-10-CM

## 2017-10-04 LAB — GLUCOSE, POCT (MANUAL RESULT ENTRY): POC Glucose: 110 mg/dl — AB (ref 70–99)

## 2017-10-04 NOTE — Progress Notes (Signed)
Subjective:    Patient ID: Michelle Mack, female    DOB: [birth year], 52 y.o.   MRN: [id number]  HPI Chief Complaint  Patient presents with  . headache    headache started friday. pain in eye and forehead  . stomach pain    stomach pain for about a month   She is here with complaints of an intermittent bilateral frontal headache and pressure behind her eyes for the past 4-5 days.  States pain is currently a 6 out of 10.  States last night her headache was a 10/10. Reports history of similar headaches in the past when her "iron was low".  No longer having periods. Last one 3 years ago.  Denies taking an iron supplement.  Recently changed her diet "drastically" and stopped caffeine completely 2 days ago. Previously she drank at least 2 caffeine drinks per day.   States she has tried Tylenol 1,000 mg last night for her symptoms. States she had some improvement.  States she was also dizzy and had difficulty swallowing last night. States she thinks she may have had an allergic reaction to a smoothie. She did not take anything but states her symptoms resolved after 30 minutes or so.  Denies dizziness, dysphasia or breathing difficulties now.  Reports having dry mouth but urine being light yellow this morning. Feels hydrated.  States her fasting blood sugar was 145 this morning and last night her blood sugar was 150.   Denies fever, chills, tinnitus, chest pain, palpitations, shortness of breath, abdominal pain, N/V/D, urinary symptoms, LE edema.  No numbness, tingling or weakness.   Past Medical History:  Diagnosis Date  . Anemia    on iron  . Back pain    tx with ibuprofen 800mg   . Bronchitis    treated recently by abx  . Cough    non-productive  . DUB (dysfunctional uterine bleeding)   . Fibroid uterus    GYN--Dr. Rivard  . GERD (gastroesophageal reflux disease)   . Hypertension   . Infertility, female   . Polychondritis   . Recurrent upper respiratory infection (URI)    in October - tx with abx  . Type II or unspecified type diabetes mellitus without mention of complication, not stated as uncontrolled    diagnosed by Dr. Berdine Addison   Past Surgical History:  Procedure Laterality Date  . BRONCHOSCOPY  01/2014   at [facility] expiratory collapse of trachea and focal narrowing in L mainstem bronchus and LUL  . ENDOMETRIAL VAPORIZATION W/ VERSAPOINT    . MYOMECTOMY  2002    Reviewed allergies, medications, past medical, surgical, family, and social history.   Review of Systems Pertinent positives and negatives in the history of present illness.     Objective:   Physical Exam  Constitutional: She is oriented to person, place, and time. She appears well-developed and well-nourished. No distress.  HENT:  Right Ear: Tympanic membrane and ear canal normal.  Left Ear: Tympanic membrane and ear canal normal.  Nose: Mucosal edema present. No rhinorrhea. Right sinus exhibits no maxillary sinus tenderness and no frontal sinus tenderness. Left sinus exhibits no maxillary sinus tenderness and no frontal sinus tenderness.  Mouth/Throat: Uvula is midline, oropharynx is clear and moist and mucous membranes are normal.  Eyes: Pupils are equal, round, and reactive to light. Conjunctivae, EOM and lids are normal.  Neck: Full passive range of motion without pain. Neck supple. No thyromegaly present.  Cardiovascular: Normal rate, regular rhythm and intact distal pulses.  Pulmonary/Chest: Effort normal and breath sounds normal.  Musculoskeletal: Normal range of motion.  Lymphadenopathy:    She has no cervical adenopathy.  Neurological: She is alert and oriented to person, place, and time. She has normal strength and normal reflexes. No cranial nerve deficit or sensory deficit. Coordination and gait normal.  No facial asymmetry or pronator drift.   Skin: Skin is warm and dry. She is not diaphoretic. No pallor.  Psychiatric: She has a normal mood and affect. Her speech is  normal and behavior is normal. Thought content normal.    BP 112/70 (BP Location: Right Arm, Patient Position: Standing, Cuff Size: Normal)   Pulse 86   Temp 98.7 F (37.1 C) (Oral)   Wt 192 lb 3.2 oz (87.2 kg)   LMP 10/09/2015   SpO2 99%   BMI 32.99 kg/m         Assessment & Plan:  Acute nonintractable headache, unspecified headache type  History of anemia - Plan: CBC with Differential/Platelet, Iron, TIBC and Ferritin Panel, Glucose (CBG)  Fatigue, unspecified type - Plan: CBC with Differential/Platelet, Comprehensive metabolic panel, TSH, Iron, TIBC and Ferritin Panel  She is not orthostatic POCT glucose: 110. has not eaten this morning  Will check labs since she has fatigue and reports history of similar headaches with anemia. She is not taking a supplemental iron.  Suspect headache may be related to caffeine withdrawal. Recommend she try a small amount of caffeine and if her goal is to stop caffeine for her to wean off of it.  Less likely a sinusitis. No URI symptoms.  Normal neuro exam.  Advised to stay well hydrated, continue on Tylenol (cannot take NSAIDs per patient)  Follow up pending labs.

## 2017-10-04 NOTE — Patient Instructions (Signed)
Try having a small amount of caffeine when you leave. Make sure you are hydrated. Avoid skipping meals or making drastic changes to your diet.  Continue taking Tylenol today.  We will call you with your lab results and check on you tomorrow.

## 2017-10-05 LAB — COMPREHENSIVE METABOLIC PANEL
ALBUMIN: 4.6 g/dL (ref 3.5–5.5)
ALK PHOS: 96 IU/L (ref 39–117)
ALT: 20 IU/L (ref 0–32)
AST: 16 IU/L (ref 0–40)
Albumin/Globulin Ratio: 1.7 (ref 1.2–2.2)
BILIRUBIN TOTAL: 1 mg/dL (ref 0.0–1.2)
BUN / CREAT RATIO: 18 (ref 9–23)
BUN: 17 mg/dL (ref 6–24)
CHLORIDE: 97 mmol/L (ref 96–106)
CO2: 25 mmol/L (ref 20–29)
Calcium: 10.2 mg/dL (ref 8.7–10.2)
Creatinine, Ser: 0.96 mg/dL (ref 0.57–1.00)
GFR calc Af Amer: 80 mL/min/{1.73_m2} (ref >59)
GFR calc non Af Amer: 70 mL/min/{1.73_m2} (ref >59)
GLUCOSE: 108 mg/dL — AB (ref 65–99)
Globulin, Total: 2.7 g/dL (ref 1.5–4.5)
POTASSIUM: 3.8 mmol/L (ref 3.5–5.2)
Sodium: 142 mmol/L (ref 134–144)
Total Protein: 7.3 g/dL (ref 6.0–8.5)

## 2017-10-05 LAB — CBC WITH DIFFERENTIAL/PLATELET
BASOS ABS: 0 10*3/uL (ref 0.0–0.2)
Basos: 0 %
EOS (ABSOLUTE): 0.1 10*3/uL (ref 0.0–0.4)
Eos: 1 %
Hematocrit: 38.6 % (ref 34.0–46.6)
Hemoglobin: 12.6 g/dL (ref 11.1–15.9)
Immature Grans (Abs): 0.1 10*3/uL (ref 0.0–0.1)
Immature Granulocytes: 1 %
LYMPHS ABS: 2.6 10*3/uL (ref 0.7–3.1)
Lymphs: 30 %
MCH: 28.7 pg (ref 26.6–33.0)
MCHC: 32.6 g/dL (ref 31.5–35.7)
MCV: 88 fL (ref 79–97)
Monocytes Absolute: 0.7 10*3/uL (ref 0.1–0.9)
Monocytes: 8 %
Neutrophils Absolute: 5.4 10*3/uL (ref 1.4–7.0)
Neutrophils: 60 %
PLATELETS: 405 10*3/uL — AB (ref 150–379)
RBC: 4.39 x10E6/uL (ref 3.77–5.28)
RDW: 14 % (ref 12.3–15.4)
WBC: 8.8 10*3/uL (ref 3.4–10.8)

## 2017-10-05 LAB — IRON,TIBC AND FERRITIN PANEL
Ferritin: 184 ng/mL — ABNORMAL HIGH (ref 15–150)
Iron Saturation: 28 % (ref 15–55)
Iron: 77 ug/dL (ref 27–159)
Total Iron Binding Capacity: 275 ug/dL (ref 250–450)
UIBC: 198 ug/dL (ref 131–425)

## 2017-10-05 LAB — TSH: TSH: 1.38 u[IU]/mL (ref 0.450–4.500)

## 2017-10-06 ENCOUNTER — Ambulatory Visit: Payer: BLUE CROSS/BLUE SHIELD | Admitting: Internal Medicine

## 2017-10-06 ENCOUNTER — Encounter: Payer: Self-pay | Admitting: Internal Medicine

## 2017-10-06 VITALS — BP 112/70 | HR 88 | Ht 64.0 in | Wt 190.6 lb

## 2017-10-06 DIAGNOSIS — J398 Other specified diseases of upper respiratory tract: Secondary | ICD-10-CM

## 2017-10-06 DIAGNOSIS — M941 Relapsing polychondritis: Secondary | ICD-10-CM

## 2017-10-06 DIAGNOSIS — R05 Cough: Secondary | ICD-10-CM | POA: Diagnosis not present

## 2017-10-06 DIAGNOSIS — R059 Cough, unspecified: Secondary | ICD-10-CM

## 2017-10-06 DIAGNOSIS — R053 Chronic cough: Secondary | ICD-10-CM

## 2017-10-06 LAB — NITRIC OXIDE: NITRIC OXIDE: 17

## 2017-10-06 NOTE — Progress Notes (Signed)
Subjective:     Patient ID: Michelle Mack, female   DOB: [birth year], 52 y.o.   MRN: [id number]  HPI    IOV 06/11/2011  52 year female. Cough for 9 months. PMD is Dr Joselyn Arrow (formerly PCP Dr Parke Simmers). Referred by Dr Arty Baumgartner. 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   Insidious onset of cough for 9 months. INnitially PMD Dr Parke Simmers considered GERD etiology. Starrted on Rx for GERD. Then referred to Dr Loreta Ave who she has been seeing for several months. PAtient states s/p BRAVO study last week (done off dexilant for 3 days) and reportedly normal. Dexilant was not helpful. Coughs all the time, intermittently. Rates severity as mild. Considers quality: barking, laryngeal cough. Made worse after dinner or feeling hot - at this time she feels it is bronchitis. Smoke, dust, cold air have no impact on cough. Made better by unclear. Dry cough. No nocturnal cough. CT chest June 2012 - normal (personally reviewed). Does not use humidifier. No mold in the house. Denies sinus drainage. Denies currently tickle in throat. On ARB for many years. Has Dulera past week but not tried it. Takes once month advil for ovarian cycles prn. Does admit to wheeze x 3 days. No dyspnea.   RSI score is 11 - level 5 cough after lying down, level 3 sensation of lump in throat, level 2 - choking, level 1 - hoarse voice.    No family hx of asthma. Spirometry today is normal. 2.08L/85%  REC Unclear why you are coughing  Please do spirometry here today  Please have methacholine challenge test and then come back to follow with me    OV 03/13/2013 Followup chronic cough. I have not seen her since December 2012; coming up 22 months since last visit.  At that time spirometry was normal and I recommended a methacholine challenge test. However, she did not follow through with that. She says in the past almost 2 years cough persists unchanged. It is dry. It is present all day. Rates it as mild to moderate and  severe. She is not sure exactly why she has not followed up but she says that she did have autoimmune evaluation for chronic cough and saw Dr. Zenovia Jordan in rheumatology and her lupus titers were elevated but SLE has been ruled out. She says she also saw the cardiologist Dr. Viann Fish for chronic cough and recollect having had a cardiac stress test that was normal.   Cardiologist ordered  CT chest 01/16/2013 that showed normal lung parenchyma ; I personally review this film. No Coronary artery calcification either (echo not done per patient)  RSI cough score today is 27 and reflects irritable larynx syndrome. Cough differentiator score shows reflux as a cause for cough but she says this is been ruled out but gastroenterology   REC Methacholine challenge test  OV 04/05/2013 Followup chronic cough after methacholine challenge test  On 03/27/13: She went for methacholine challenge test but this was not even started because baseline spirometry showed : Spirometry Fev1 1.38L/56%, FVC 3L/100%, RAtio 46. Obstruction moderate. On fV loop lot of cough. ? VCD. Therefore on 03/28/2013 she started a 12 day prednisone taper along with Dulera. She is now midway through her prednisone and she says she is compliant with her Elwin Sleight but her cough has not improved at all and it persists unchanged. Discovered she is taking dulera prn basis and she is rinsing her mdi with water after use. However, mdi technique  is good thou In addition, last 3 days she's had some atypical chest pains that have happened at rest at the sternal parasternal region. These are transient and can last any duration from a few seconds to a few minutes and come on randomly without any aggravating or relieving factors. In addition yesterday, she resumed her normal daily walks after a week absence and she noted shortness of breath and a feeling of going to pass out  She is very upset that no diagnosis has been made and treatment prescribed has  not helped her and she is new additional symptoms. She seems to be losing trust in my advice  Spirometry toay fev1 1.56L/65 % and ratio 50% - moderate obstruction. 180cc improvement in fev1 only (done wtiih patient compliant on prednisone taper but takign dulera prn and wrong technique)    REC Continue and finish prednisone taper as scheduled Continue dulera 200/5, 2 puff twice daily - learn technique Start QVAR , 2 puiff twice daily , learn technique STart albuterol as needed, 2 puff as needed REturn in 2 weeks to see me 04/18/13 possible or NP  - spirometry at followup  OV 05/16/2013   Followup chronic cough  Last visit I emphasized that she take her [facility] diligently. Also introduced supplementary Qvar for small particle effect. She tells me that she has been taking Dulera which is puzzled as to what Qvar is. I'm not so sure that she is compliant with Dulera but she claims she is. Nevertheless her cough she says is only mildly improved but she continues to wheezing. RSI cough score is 22 and shows mild improvement. Of note, she denies any sinus drainage or acid reflux issues. She admits to constant clearing of the throat. She's frustrated and wants quick relief with cough  Past, Family, Social reviewed: no change since last visit   REC  - sinus: nasal steroid, nasal saline to continue - gerd: stop fish poil, start prilosec - asthma: contnue dulera - VCD: STart gabapentin  OV 06/26/2013     Chief Complaint  Patient presents with   Cough    follow-up. Pt states cough is worse since last OV.  Pt states that she never started gabapentin due to indications listed on paperwork.     Now here with husband. Last visit 5 weeks ago. REcommended to continue nasal steroid/saline which she is doing, to stop fish oil which she has done but did not start prilosec thinking it was a prescription, continues dulera but did not start neurontin for VCD after reading drug insert that did not  show cough indication (this is despite me counseling her this was off -label)  Now cough is worse. A week or two ago started noticing worsening dyspnea, orthopnea, cough, wheeze. Notices wheeze to improve when she presses on throat. Went to ER 06/21/13 and dc'ed on pred taper for few days which she has finisheed (labs that day reviewed: CXR, CBC, BMET, trop, bNP, d-dimer all normal). HOwever, un-improved since then. OVerall cough is worse. She is upset at me that she is not better. RSI Cough score detailed below is 36 and shows severe symptomatology. She and husband point to throat as cause of cough  Spirometry todayL FEv1 1.77L/73%, RAtio 46, Lot of cough during expirtion. No insp loop available. She denies mul;tiple anestheia attempts or traumat to throat or thyroid issues   REC  #Chronic cough  - unclear why you are not totally bettte - need to do treatment below with 100% compliance  -  for sinus (empiric)  -  continue OTC nasacort inhaler 2 squirts each nostril daily in morning  - continue 2.7% hyperteonic saline OTC nasal spray daily at night  - for acid reflux (empiric)  - do not go back to  fish oil  - start prilosec 20mg  OTC once daily on empty stomach in morning  - for asthma  - Take prednisone 40 mg daily x 2 days, then 20mg  daily x 2 days, then 10mg  daily x 2 days, then 5mg  daily x 2 days and stop  -  take levaquin 500mg  once daily  X 5 days  -  contiunue dulera 2 puff twice daily  -  Refer to Dr Aris Georgia for Exhaled Nitric Oxide evaluation  - for Irritable Larynx of Vocal Cord spasm producing wheeze  - hold off on  neurontin due to side effect and indication concern  - refer you to ENT   - do CT scan sinus without contrast - for chronic cough  - do CT scan neck with and without contrast - for chronic cough and stridor  #Followup At any time yuou prefer a second opinion, wil work with you on that 4 weeks with cough score at followup   OV 07/24/2013  Chief Complaint   Patient presents with   Cough    follow-up. Pt states cough is the same.    FU chronic cough. Result review. COugh not better. She is frustrated that cough not better at all despite multiple test and interventinos. CMA says that she does not know her meds and is not compliant. RSI cough score is still 37 and not better. PRdnisone burst at last OV did not help   Ct chest July 2014: No pulmonary abnormalities  CT sinus Dec 2014:  shows mild chronic siusitis. She says she saw ENT and they were puzzled why I made referral. I do not have their notes   CT neck Jan 2014:  is normal except mild TMJ arthritis . ADvised to see Dentist; unrelatd to cough  Exhaled NO: She saw Dr Gary Fleet. Echalend NO was < 25 (her report is being sent for scan). Per patient this was done off dulera for over week  Current meds: patinet somewhat possibly compliant with nasal steroids. Usnure if she is takig ppi. Not taking dulera x 1 month. Did nto want to try neurotin   Plan Refer [facility] [employer]   OV 10/23/2015  Chief Complaint  Patient presents with   Follow-up    Pt last seen in 07/2013 for chronic cough. Pt states a recent CT chest on 10/13/15 showed a lung lesion. Pt c/o nonprod cough and left upper chest pain at random times. Pt denies SOB.    Follow-up chronic cough  It has been over 2 years since I saw Michelle Mack - she had refractory chronic cough and I referred her to [facility]. I have please the history together after talking to her and review of the chart. She initially saw Dr. Graylon Good at [facility]. It appears that a diagnosis of relapsing polychondritis was made. [employer] test showed positive ANA 1:160. July 2015 she also had a endobronchial biopsy of the left upper lobe just showed chronic bronchitis. Review of the chart suggests that she was initially on Imuran/prednisone. Then sometime in summer of 2016 approximately June 2016 she was started on CellCept and  prednisone. This might of been due to a reaction to Imuran. She has been on CellCept since then.  At some point in time her K was switched over to [facility] pulmonary doctor Namen and Dr Maurine Minister Ang of rheumatology. The pulmonary notes at [facility] suggests that she has relapsing polychondritis. However the rheumatology notes at [facility] that is that she has tracheomalacia [because of collapse on the CT scan] and she did not meet all the criteria for relapsing polychondritis. Nevertheless patient is continued on CellCept all along with care at [facility]. She herself was not sure if she has relapsing polychondritis or not. She had CT chest March 2016 that did not show any adversity. Also had a chest x-ray February 2017 that was clear.  Then apparently she made at shot trip to [place] early March 2017 and on arriving that she got dyspneic. She brought herself to the ER. She had a CT chest angiogram. I personally visualized image and it shows complete left upper lobe collapse. The report also says she has no pulmonary embolism. In addition reports that she has suprahilar and mediastinal adenopathy. She apparently returned by bus to [place]. She subsequently went to [facility] emergency room where she had an x-ray was end of March 2017 that does not show any collapse.  She is now reestablishing herself with need to follow up these issues of the abnormal chest x-ray. It is not clear to me why she has decided to now follow-up here while she is followed up at [facility] in [facility] all along. She lives closer to my office but she did not express travel as an issue going to [facility]. At the same that she is asking me to do all the workup. I told her that I very limited experience with relapsing polychondritis but nevertheless she wants me to initiate the workup.  Of note I thought when I made the referral to [facility] few years ago she was not too happy with my care but she denies  that is the case.   OV 11/13/2015  Chief Complaint  Patient presents with   Follow-up    Pt here after PET scan. Pt denies changes in her breathing. Pt denies cough and CP/tightness.    Presents with her husband are discussed PET scan results. The PET scan shows resolution of her lung collapse and no evidence of PET active mediastinal or hilar adenopathy. At this point in time she is stable. She is relieved to know the results. PET scan does show the possibility of uterine fibroids. She does me that since her [place] to her CellCept is on hold. The CellCept and prednisone are being monitored by the primary pulmonologist at [facility] area she is now here with her husband. She is again not sure where she wants to all up in future for her relapsing polychondritis. I had a detailed discussion about this with her. They finally established that it is in her best interest that [facility] continue to be primary pulmonary for her relapsing polychondritis and monitor her CellCept and prednisone with me being back up in [place] for any acute events. She and her husband are agreeable with the plan. This because of the complexity of the problem and the fact she is on immunomodulating drugs.     PET scan 10/30/15 IMPRESSION: 1. No enlarged or hypermetabolic lymph nodes in the neck, chest, abdomen or pelvis. 2. Minimal tracheal wall thickening involving the cartilaginous tracheal wall with associated mild tracheal narrowing at the level of the mid trachea, consistent with the provided history of  relapsing polychondritis with possible mild mid tracheal stenosis. No active pulmonary disease. 3. Enlarged anteverted uterus with diffuse low-level myometrial hypermetabolism, with no discrete uterine mass or focal uterine hypermetabolism. This finding is nonspecific and could be due to the diffuse adenomyosis of the uterus and/or uterine fibroids. Endometrial pathology cannot be excluded on the  basis of this study. The endometrium and myometrium were not well visualized on the 03/18/2015 pelvic sonogram. Consider correlation with pelvic MRI as clinically warranted.     Electronically Signed   By: Delbert Phenix M.D.   On: 10/30/2015 15:44          OV 10/06/17  Chief Complaint  Patient presents with   Acute Visit    Pt last seen in 11/2015 by MR. Pt c/o dry cough, constant sharp pain in chest, increase in SOB when going up stairs x 1 month. Pt denies f/c/s.    52 year old female with chronic cough and a diagnosis of relapsing polychondritis.  Not seen in 2 years.  At last visit I mainly advised her to follow-up with [facility] where she was on immune modulators and due to my lack of experience with treating this particular condition.  I offered to be a backup physician for emergencies.  She now returns for follow-up after 2 years.  She tells me that her chronic cough still persists.  RSI cough score is documented below.  Overall it is unchanged.  Review of the chart and talking to her it appears that she ran out of methotrexate for over a month.  She saw Dr. Darrick Huntsman pulmonary infiltrate 2019.  Her methotrexate was increased.  She says she is taking the increased dose.n they are advised to follow-up labs pretty closely and a follow-up visit in May 2019 but she tells me that she does not have a follow-up and no particular advice was given.  She now wants me to address the cough.  I try to explain to her that the cough needs to be addressed in the context of the relapsing polychondritis and management.  She got upset with me because she assumed that I was unwilling to comanage.  We did a exam nitric oxide test in the office today and is normal;feno - 17ppb. cOugh is dry and present day night and moderate  [facility],    Dr Gretta Cool Reflux Symptom Index (> 13-15 suggestive of LPR cough) 03/13/2013  05/16/2013 After dulera 06/26/2013 S/p  ER visit 06/21/13 07/24/2013  10/23/2015  12/25/2017   Hoarseness of problem with voice 3 3 5 5 2 3   Clearing  Of Throat 4 5 5 5 3 5   Excess throat mucus or feeling of post nasal drip 0 0 3 4 3  0  Difficulty swallowing food, liquid or tablets 4 0 0 0 1 3  Cough after eating or lying down 5 5 5 5 5    Breathing difficulties or choking episodes 5 4 5 5 3 5   Troublesome or annoying cough 5 5 5 5 5 5   Sensation of something sticking in throat or lump in throat 0 0 5 5 3  0  Heartburn, chest pain, indigestion, or stomach acid coming up 1 0 3 3 4 2   TOTAL 27 22 36 37 29        has a past medical history of Anemia, Back pain, Bronchitis, Cough, DUB (dysfunctional uterine bleeding), Fibroid uterus, GERD (gastroesophageal reflux disease), Hypertension, Infertility, female, Polychondritis, Recurrent upper respiratory  infection (URI), and Type II or unspecified type diabetes mellitus without mention of complication, not stated as uncontrolled.   reports that she has never smoked. She has never used smokeless tobacco.  Past Surgical History:  Procedure Laterality Date   BRONCHOSCOPY  01/2014   at [facility] expiratory collapse of trachea and focal narrowing in L mainstem bronchus and LUL   ENDOMETRIAL VAPORIZATION W/ VERSAPOINT     MYOMECTOMY  2002      Immunization History  Administered Date(s) Administered   Hepatitis A 12/27/2007, 01/24/2008, 07/17/2008   Hepatitis B 12/27/2007, 01/24/2008, 07/17/2008   IPV 12/27/2007   MMR 12/27/2007   Meningococcal Polysaccharide 12/27/2007   PPD Test 04/14/2011, 06/08/2013, 01/01/2015, 03/08/2017   Td 12/28/1995   Tdap 12/27/2007   Typhoid Inactivated 12/27/2007   Varicella 12/27/2007   Yellow Fever 01/24/2008    Family History  Problem Relation Age of Onset   Hypertension Mother    Cancer Mother    Hypertension Father    Hypertension Sister    Thyroid nodules Sister    Diabetes Maternal Grandmother    Breast cancer Maternal Grandmother     Heart disease Maternal Grandmother    Heart disease Paternal Grandmother      Current Outpatient Medications:    folic acid (FOLVITE) 1 MG tablet, Take 1 mg by mouth., Disp: , Rfl:    hydrochlorothiazide (HYDRODIURIL) 25 MG tablet, Take 25 mg by mouth daily., Disp: , Rfl: 3   KLOR-CON M20 20 MEQ tablet, TAKE 1 TABLET BY MOUTH EVERY DAY, Disp: 30 tablet, Rfl: 0   metFORMIN (GLUCOPHAGE) 1000 MG tablet, TAKE 1 TABLET TWICE A DAY WITH A MEAL, Disp: 180 tablet, Rfl: 1   methotrexate 2.5 MG tablet, Take 2.5 mg by mouth 2 (two) times a week., Disp: , Rfl:    predniSONE (DELTASONE) 20 MG tablet, Take 5 mg by mouth daily with breakfast. , Disp: , Rfl:    amLODipine (NORVASC) 5 MG tablet, TAKE 1 TABLET BY MOUTH EVERY DAY, Disp: 90 tablet, Rfl: 0   atorvastatin (LIPITOR) 20 MG tablet, TAKE 1 TABLET BY MOUTH EVERY DAY, Disp: 90 tablet, Rfl: 0   cholecalciferol (VITAMIN D) 1000 units tablet, Take 1,000 Units by mouth daily., Disp: , Rfl:    ciprofloxacin (CIPRO) 500 MG tablet, Take 1 tablet (500 mg total) by mouth 2 (two) times daily., Disp: 6 tablet, Rfl: 0   Dulaglutide (TRULICITY) 0.75 MG/0.5ML SOPN, Inject 0.75 mg into the skin daily., Disp: , Rfl:    FARXIGA 10 MG TABS tablet, TAKE 1 TABLET BY MOUTH EVERY DAY (Patient not taking: Reported on 10/27/2017), Disp: 90 tablet, Rfl: 0   KLOR-CON M20 20 MEQ tablet, TAKE 1 TABLET BY MOUTH EVERY DAY, Disp: 30 tablet, Rfl: 2    Review of Systems     Objective:   Physical Exam  Constitutional: She is oriented to person, place, and time. She appears well-developed and well-nourished. No distress.  HENT:  Head: Normocephalic and atraumatic.  Right Ear: External ear normal.  Left Ear: External ear normal.  Mouth/Throat: Oropharynx is clear and moist. No oropharyngeal exudate.  Eyes: Pupils are equal, round, and reactive to light. Conjunctivae and EOM are normal. Right eye exhibits no discharge. Left eye exhibits no discharge. No scleral icterus.  Neck:  Normal range of motion. Neck supple. No JVD present. No tracheal deviation present. No thyromegaly present.  Cardiovascular: Normal rate, regular rhythm, normal heart sounds and intact distal pulses. Exam reveals no gallop and no friction  rub.  No murmur heard. Pulmonary/Chest: Effort normal and breath sounds normal. No respiratory distress. She has no wheezes. She has no rales. She exhibits no tenderness.  Abdominal: Soft. Bowel sounds are normal. She exhibits no distension and no mass. There is no tenderness. There is no rebound and no guarding.  Musculoskeletal: Normal range of motion. She exhibits no edema or tenderness.  Lymphadenopathy:    She has no cervical adenopathy.  Neurological: She is alert and oriented to person, place, and time. She has normal reflexes. No cranial nerve deficit. She exhibits normal muscle tone. Coordination normal.  Skin: Skin is warm and dry. No rash noted. She is not diaphoretic. No erythema. No pallor.  Psychiatric: She has a normal mood and affect. Her behavior is normal. Judgment and thought content normal.  Vitals reviewed.  Vitals:   10/06/17 1219  BP: 112/70  Pulse: 88  SpO2: 98%  Weight: 190 lb 9.6 oz (86.5 kg)  Height: 5\' 4"  (1.626 m)    Estimated body mass index is 32.72 kg/m as calculated from the following:   Height as of this encounter: 5\' 4"  (1.626 m).   Weight as of this encounter: 190 lb 9.6 oz (86.5 kg).      Assessment:       ICD-10-CM   1. Chronic cough R05 Nitric oxide  2. Tracheal stenosis J39.8   3. Relapsing polychondritis M94.1   4. 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 This includes one or more of the following documented above:  discussion of test results, diagnostic or treatment recommendations, prognosis, risks and benefits of management options, instructions, education, compliance or risk-factor reduction)   Dr. Kalman Shan, M.D., [facility].C.P Pulmonary and Critical Care Medicine Staff Physician, [facility] Center Director - Interstitial Lung Disease  Program  Pulmonary Fibrosis [facility] [place], [place], [address]  Pager: [phone], If no answer or between  15:00h - 7:00h: call [phone] Telephone: [phone]

## 2017-10-06 NOTE — Patient Instructions (Signed)
ICD-10-CM   1. Chronic cough R05   2. Tracheal stenosis J39.8   3. Relapsing polychondritis M94.1      Do CT sinus and HRCT  Plan - return to discuss results with app/MD next few weeks

## 2017-10-08 NOTE — Progress Notes (Deleted)
Patient presents for 6 month follow-up (now 7 months since her last visit with me, since she left without being seen in March).    She saw her pulmonary doctor (at [facility]) for the first time in 2 years last week.  CT of sinuses and chest are ordered. These are scheduled for 4/16.  She had seen [name] a few days prior, with complaints of headache--Ddx caffeine withdrawal, sinus.  Labs were checked:   Chemistry      Component Value Date/Time   NA 142 10/04/2017 1009   K 3.8 10/04/2017 1009   CL 97 10/04/2017 1009   CO2 25 10/04/2017 1009   BUN 17 10/04/2017 1009   CREATININE 0.96 10/04/2017 1009   CREATININE 1.09 06/30/2017 1455      Component Value Date/Time   CALCIUM 10.2 10/04/2017 1009   ALKPHOS 96 10/04/2017 1009   AST 16 10/04/2017 1009   ALT 20 10/04/2017 1009   BILITOT 1.0 10/04/2017 1009     Glucose 108 Lab Results  Component Value Date   IRON 77 10/04/2017   TIBC 275 10/04/2017   FERRITIN 184 (H) 10/04/2017   Lab Results  Component Value Date   WBC 8.8 10/04/2017   HGB 12.6 10/04/2017   HCT 38.6 10/04/2017   MCV 88 10/04/2017   PLT 405 (H) 10/04/2017   Lab Results  Component Value Date   TSH 1.380 10/04/2017    She also saw [name] in February and diagnosed with UTI (culture showed proteus mirabilis) and treated with Cipro. No further urinary symptoms.  Hypertension:  She is currently taking 65m amlodipine, and HCTZ 227m as well as 2049mof KCl (due to hypokalemia noted in 04/2017).  Her last K+ was still low at 3.3 on last check at endocrinologist office in December, but normal on recent labs done here (see above) BP's have been running She denies chest pain, palpitations, exertional dyspnea, edema.  +headaches per [name]'s recent visit. BP Readings from Last 3 Encounters:  10/06/17 112/70  10/04/17 112/70  09/12/17 110/70   She has never been on ACE/ARB She had urine microalbumin/Cr ratio of <30 per Dr. AltElyse Hsu 04/2017.  Diabetes:  She was  referred to Dr. AltElyse Hsur uncontrolled DM. She is on chronic prednisone. She is on Metformin and FarIranrulicity was recommended by Dr. AltElyse Hsu initial consult in October, but it wasn't started until her f/u appointment in January. She had reported compliance with oral meds, but her February urine dip did not show glucosuria (when she saw [name] for UTI). Last A1c was down from 10.2 in September, to 8.9% in 06/2017. She had normal foot exam in 04/2017, and sees podiatrist regularly.   Last ophtho was (04/2016?? UPDATE)  Hyperlipidemia:  She reports compliance with atorvastatin. Last lipids 04/2017 were at goal, TC 111, LDL 52, HDL 45, TG 144, chol/HDL ratio 2.5  Vitamin D deficiency: Vitamin D-OH level was 52 in 04/2017  Immunization History  Administered Date(s) Administered  . Hepatitis A 12/27/2007, 01/24/2008, 07/17/2008  . Hepatitis B 12/27/2007, 01/24/2008, 07/17/2008  . IPV 12/27/2007  . MMR 12/27/2007  . Meningococcal Polysaccharide 12/27/2007  . PPD Test 04/14/2011, 06/08/2013, 01/01/2015, 03/08/2017  . Td 12/28/1995  . Tdap 12/27/2007  . Typhoid Inactivated 12/27/2007  . Varicella 12/27/2007  . Yellow Fever 01/24/2008     PHYSICAL EXAM:   ASSESSMENT/PLAN:   Pneumovax (she was supposed to discuss with her pulm ,after she declined at her last CPE in Sept) Tdap due (refuses flu  shots, also recommended) Shingrix age 52  Schedule diabetic eye exam (last 04/2016)  DM--?taking [place] and Trulicity?  Lipids and D controlled  Recent UTI  HTN--consider ARB,consider decrease HCTZ due to the hypokalemia--resolved.

## 2017-10-10 ENCOUNTER — Telehealth: Payer: Self-pay | Admitting: *Deleted

## 2017-10-10 ENCOUNTER — Encounter: Payer: BLUE CROSS/BLUE SHIELD | Admitting: Family Medicine

## 2017-10-10 NOTE — Telephone Encounter (Signed)

## 2017-10-10 NOTE — Telephone Encounter (Signed)
She needs no show letter sent. I am not comfortable with how this patient is being cared for.  She comes to see [name] for acute visits (even when I have openings available).  I do not like fragmented care.  She left without being seen last month, r/s for today, but then saw [name] last week, and then no-showed her visit today. She is scheduled for a physical with me in September. I personally am not sure how well we are working as a team in delivering her care.  I think she should perhaps consider seeing [name] as PCP, since she has a preference for her whenever she is ill, if [name] is agreeable.  The labs that were done by [name] were some that I would have done at her visit, but she had an acute visit, whereas my visits are trying to coordinate care and review records from all of her other doctors.  I had some concerns that I was planning to address with her (no glucose in her urine at the visit with [name] for urinary complaints leads me to believe she isn't compliant in taking some of her diabetes meds, etc), which I have not been able to discuss.  Your input would be appreciated, but I think we need to  address this with the patient and [name].

## 2017-10-18 ENCOUNTER — Ambulatory Visit (INDEPENDENT_AMBULATORY_CARE_PROVIDER_SITE_OTHER)
Admission: RE | Admit: 2017-10-18 | Discharge: 2017-10-18 | Disposition: A | Discharge status: Home / Self Care | Payer: BLUE CROSS/BLUE SHIELD | Source: Ambulatory Visit | Attending: Internal Medicine | Admitting: Internal Medicine

## 2017-10-18 DIAGNOSIS — R05 Cough: Secondary | ICD-10-CM | POA: Diagnosis not present

## 2017-10-18 DIAGNOSIS — R059 Cough, unspecified: Secondary | ICD-10-CM

## 2017-10-20 ENCOUNTER — Ambulatory Visit: Payer: BLUE CROSS/BLUE SHIELD | Admitting: Adult Health

## 2017-10-20 ENCOUNTER — Encounter: Payer: Self-pay | Admitting: Adult Health

## 2017-10-20 DIAGNOSIS — R053 Chronic cough: Secondary | ICD-10-CM

## 2017-10-20 DIAGNOSIS — M941 Relapsing polychondritis: Secondary | ICD-10-CM

## 2017-10-20 DIAGNOSIS — R05 Cough: Secondary | ICD-10-CM

## 2017-10-20 MED ORDER — BENZONATATE 200 MG PO CAPS: 200.0000 mg | ORAL_CAPSULE | Freq: Three times a day (TID) | ORAL | 1 refills | Status: DC | PRN

## 2017-10-20 NOTE — Progress Notes (Signed)
$'@Patient'n$  ID: [name], female    DOB: 1966/05/31, 52 y.o.   MRN: [id number]  Chief Complaint  Patient presents with  . Follow-up    cough     Referring provider: [name], MD  HPI: 52 year old female followed for chronic cough with diagnosis of relapsing polychondritis.  She is followed mainly at [facility].  She is on methotrexate and prednisone.  10/20/2017 Follow up ; Chronic cough  Patient returns for a 2-week follow-up.  This visit patient was having increased coughing episodes.  She was set up for a CT chest high resolution images showed no evidence of interstitial lung disease there was mild airway trapping.  Also set up for a CT sinus that showed minimum opacification of the right sphenoid sinus.  Otherwise clear paranasal sinuses.  Patient is on methotrexate.  She is also on prednisone.  She has recently increased her prednisone which has helped with her cough.  He is not using anything for cough. She denies any chest pain, orthopnea, edema, hemoptysis.    Immunization History  Administered Date(s) Administered  . Hepatitis A 12/27/2007, 01/24/2008, 07/17/2008  . Hepatitis B 12/27/2007, 01/24/2008, 07/17/2008  . IPV 12/27/2007  . MMR 12/27/2007  . Meningococcal Polysaccharide 12/27/2007  . PPD Test 04/14/2011, 06/08/2013, 01/01/2015, 03/08/2017  . Td 12/28/1995  . Tdap 12/27/2007  . Typhoid Inactivated 12/27/2007  . Varicella 12/27/2007  . Yellow Fever 01/24/2008     Tobacco History: Social History   Tobacco Use  Smoking Status Never Smoker  Smokeless Tobacco Never Used   Counseling given: Not Answered     Review of Systems  Constitutional:   No  weight loss, night sweats,  Fevers, chills, fatigue, or  lassitude.  HEENT:   No headaches,  Difficulty swallowing,  Tooth/dental problems, or  Sore throat,                No sneezing, itching, ear ache,  +nasal congestion, post nasal drip,   CV:  No chest pain,  Orthopnea, PND,  swelling in lower extremities, anasarca, dizziness, palpitations, syncope.   GI  No heartburn, indigestion, abdominal pain, nausea, vomiting, diarrhea, change in bowel habits, loss of appetite, bloody stools.   Resp:   No chest wall deformity  Skin: no rash or lesions.  GU: no dysuria, change in color of urine, no urgency or frequency.  No flank pain, no hematuria   MS:  No joint pain or swelling.  No decreased range of motion.  No back pain.    Physical Exam  BP 120/70 (BP Location: Left Arm, Cuff Size: Normal)   Pulse 91   Ht '5\' 4"'$  (1.626 m)   Wt 187 lb (84.8 kg)   LMP 10/09/2015   SpO2 98%   BMI 32.10 kg/m   GEN: A/Ox3; pleasant , NAD, well nourished    HEENT:  [place]/AT,  EACs-clear, TMs-wnl, NOSE-clear, THROAT-clear, no lesions, no postnasal drip or exudate noted.   NECK:  Supple w/ fair ROM; no JVD; normal carotid impulses w/o bruits; no thyromegaly or nodules palpated; no lymphadenopathy.  No stridor  RESP  Clear  P & A; w/o, wheezes/ rales/ or rhonchi. no accessory muscle use, no dullness to percussion  CARD:  RRR, no m/r/g, no peripheral edema, pulses intact, no cyanosis or clubbing.  GI:   Soft & nt; nml bowel sounds; no organomegaly or masses detected.   Musco: Warm bil, no deformities or joint swelling noted.   Neuro: alert, no focal deficits  noted.    Skin: Warm, no lesions or rashes    Lab Results:  CBC    Component Value Date/Time   WBC 8.8 10/04/2017 1009   WBC 9.8 06/30/2017 1455   RBC 4.39 10/04/2017 1009   RBC 4.17 06/30/2017 1455   HGB 12.6 10/04/2017 1009   HCT 38.6 10/04/2017 1009   PLT 405 (H) 10/04/2017 1009   MCV 88 10/04/2017 1009   MCH 28.7 10/04/2017 1009   MCH 28.5 06/30/2017 1455   MCHC 32.6 10/04/2017 1009   MCHC 33.4 06/30/2017 1455   RDW 14.0 10/04/2017 1009   LYMPHSABS 2.6 10/04/2017 1009   MONOABS 404 12/10/2016 1317   EOSABS 0.1 10/04/2017 1009   BASOSABS 0.0 10/04/2017 1009    BMET    Component Value Date/Time    NA 142 10/04/2017 1009   K 3.8 10/04/2017 1009   CL 97 10/04/2017 1009   CO2 25 10/04/2017 1009   GLUCOSE 108 (H) 10/04/2017 1009   GLUCOSE 167 (H) 06/30/2017 1455   BUN 17 10/04/2017 1009   CREATININE 0.96 10/04/2017 1009   CREATININE 1.09 06/30/2017 1455   CALCIUM 10.2 10/04/2017 1009   GFRNONAA 70 10/04/2017 1009   GFRAA 80 10/04/2017 1009    BNP No results found for: BNP  ProBNP    Component Value Date/Time   PROBNP 5.1 11/18/2013 2315    Imaging: Ct Chest High Resolution  Result Date: 10/19/2017 CLINICAL DATA:  52 year old female with history of dry cough for 1 month. EXAM: CT CHEST WITHOUT CONTRAST TECHNIQUE: Multidetector CT imaging of the chest was performed following the standard protocol without intravenous contrast. High resolution imaging of the lungs, as well as inspiratory and expiratory imaging, was performed. COMPARISON:  PET-CT 10/30/2015.  Chest CT 01/16/2013. FINDINGS: Cardiovascular: Heart size is normal. There is no significant pericardial fluid, thickening or pericardial calcification. No atherosclerotic calcifications noted in the thoracic aorta or the coronary arteries. Mediastinum/Nodes: No pathologically enlarged mediastinal or hilar lymph nodes. Please note that accurate exclusion of hilar adenopathy is limited on noncontrast CT scans. Esophagus is unremarkable in appearance. No axillary lymphadenopathy. Lungs/Pleura: High-resolution images demonstrate no significant regions of ground-glass attenuation, subpleural reticulation, parenchymal banding, traction bronchiectasis or frank honeycombing. Inspiratory and expiratory imaging demonstrates some mild air trapping indicative of mild small airways disease. No acute consolidative airspace disease. No pleural effusions. No suspicious appearing pulmonary nodules or masses. Upper Abdomen: Unremarkable. Musculoskeletal: There are no aggressive appearing lytic or blastic lesions noted in the visualized portions of the  skeleton. IMPRESSION: 1. No findings to suggest interstitial lung disease. 2. Mild air trapping indicative of mild small airways disease. Electronically Signed   By: Vinnie Langton M.D.   On: 10/19/2017 11:27   [place] [facility]  Result Date: 10/18/2017 CLINICAL DATA:  Chronic dry cough EXAM: CT PARANASAL SINUS LIMITED WITHOUT CONTRAST TECHNIQUE: Non-contiguous multidetector CT images of the paranasal sinuses were obtained in a single plane without contrast. COMPARISON:  None. FINDINGS: There is mild opacification of the inferior right sphenoid sinus. Otherwise, the paranasal sinuses are clear. IMPRESSION: Minimal opacification of the floor of the right sphenoid sinus. Otherwise clear paranasal sinuses. Electronically Signed   By: Ulyses Jarred M.D.   On: 10/18/2017 16:17     Assessment & Plan:   Chronic cough Add in cough control medicines with Tessalon and Delsym  Plan  Patient Instructions  Begin Delsym 2 tsp Twice daily for cough As needed   Begin Tessalon Three times a day  for cough As needed   Taper prednisone as planned  Follow up with [facility] as planned in June  Follow up with Dr. Chase Caller As needed        Chronic polychondritis Continue on methotrexate and prednisone.  Taper prednisone as directed by weight force.  She is to follow back with [facility] next month as planned     [employer], NP 10/20/2017

## 2017-10-20 NOTE — Patient Instructions (Signed)
Begin Delsym 2 tsp Twice daily for cough As needed   Begin Tessalon Three times a day for cough As needed   Taper prednisone as planned  Follow up with [facility] as planned in June  Follow up with Dr. Chase Caller As needed

## 2017-10-20 NOTE — Assessment & Plan Note (Signed)
Continue on methotrexate and prednisone.  Taper prednisone as directed by weight force.  She is to follow back with [facility] next month as planned

## 2017-10-20 NOTE — Assessment & Plan Note (Signed)
Add in cough control medicines with Tessalon and Delsym  Plan  Patient Instructions  Begin Delsym 2 tsp Twice daily for cough As needed   Begin Tessalon Three times a day for cough As needed   Taper prednisone as planned  Follow up with [facility] as planned in June  Follow up with Dr. Chase Caller As needed

## 2017-10-25 ENCOUNTER — Encounter: Payer: Self-pay | Admitting: Sports Medicine

## 2017-10-25 ENCOUNTER — Ambulatory Visit: Payer: BLUE CROSS/BLUE SHIELD | Admitting: Sports Medicine

## 2017-10-25 DIAGNOSIS — M7750 Other enthesopathy of unspecified foot: Secondary | ICD-10-CM | POA: Diagnosis not present

## 2017-10-25 DIAGNOSIS — M79675 Pain in left toe(s): Secondary | ICD-10-CM

## 2017-10-25 DIAGNOSIS — Z9889 Other specified postprocedural states: Secondary | ICD-10-CM

## 2017-10-25 DIAGNOSIS — E119 Type 2 diabetes mellitus without complications: Secondary | ICD-10-CM

## 2017-10-25 NOTE — Progress Notes (Signed)
Subjective: Michelle Mack is a 52 y.o. diabetic female patient returns to office today for follow up evaluation after having Left Hallux medial permanent nail avulsion performed on 08/02/2017.  Patient states that she has noticed the nail cracking and wants the nail checked. Reports that she also has noticed a bump on the top side of her left foot and wants to have it checked. Denies pain to left foot but does have occasional pain to left 1st toe. Denies any other issues.   FBS not checked  Patient Active Problem List   Diagnosis Date Noted  . 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 Fibroid, uterine 11/13/2015  . Mediastinal adenopathy 10/23/2015  . Collapse of left lung 10/23/2015  . Relapsing polychondritis 10/23/2015  . Type 2 diabetes mellitus, controlled, with renal complications ([place]) 10/93/2355  . CKD (chronic kidney disease) stage 2, GFR 60-89 ml/min 11/06/2014  . Type 2 diabetes mellitus without complication ([place]) 73/22/0254  . Essential hypertension 05/22/2014  . Chronic polychondritis 05/02/2014  . Dysfunctional uterine bleeding 04/29/2014  . Obstructive apnea 04/29/2014  . Other specified respiratory disorders 04/29/2014  . Microscopic hematuria 04/12/2014  . 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 Positive ANA (antinuclear antibody) 02/26/2014  . Vitamin D deficiency 07/31/2013  . Obesity (BMI 30-39.9)   . Impaired fasting glucose 05/24/2012  . Fibroid uterus 05/24/2012  . Infertility, female   . DUB (dysfunctional uterine bleeding)   . Chronic cough 05/25/2011  . GERD (gastroesophageal reflux disease) 05/25/2011  . Anemia 05/25/2011  . Essential hypertension, benign 04/05/2011    Current Outpatient Medications on File Prior to Visit  Medication Sig Dispense Refill  . amLODipine (NORVASC) 5 MG tablet TAKE 1 TABLET BY MOUTH EVERY DAY 90 tablet 0  . atorvastatin (LIPITOR) 20 MG tablet TAKE 1 TABLET BY MOUTH EVERY DAY 30 tablet 0  . benzonatate (TESSALON) 200 MG capsule Take 1  capsule (200 mg total) by mouth 3 (three) times daily as needed for cough. 30 capsule 1  . cholecalciferol (VITAMIN D) 1000 units tablet Take 1,000 Units by mouth daily.    . Dulaglutide (TRULICITY) 2.70 WC/3.7SE SOPN Inject 0.75 mg into the skin daily.    Marland Kitchen FARXIGA 10 MG TABS tablet TAKE 1 TABLET BY MOUTH EVERY DAY 90 tablet 0  . folic acid (FOLVITE) 1 MG tablet Take 1 mg by mouth.    . hydrochlorothiazide (HYDRODIURIL) 25 MG tablet Take 25 mg by mouth daily.  3  . KLOR-CON M20 20 MEQ tablet TAKE 1 TABLET BY MOUTH EVERY DAY 30 tablet 0  . metFORMIN (GLUCOPHAGE) 1000 MG tablet TAKE 1 TABLET TWICE A DAY WITH A MEAL 180 tablet 1  . methotrexate 2.5 MG tablet Take 2.5 mg by mouth 2 (two) times a week.    . predniSONE (DELTASONE) 20 MG tablet Take 5 mg by mouth daily with breakfast.      No current facility-administered medications on file prior to visit.     Multiple allergies see chart  Objective:  General: Well developed, nourished, in no acute distress, alert and oriented x3   Dermatology: Skin is warm, dry and supple bilateral.  Left hallux medial nail bed appears to be clean, dry, with keratosis at medial nail fold. (-) Erythema. (-) Edema. (-) serosanguous drainage present. There is a small trauma line at left hallux nail. The remaining nails appear unremarkable at this time. There are no other lesions or other signs of infection present.  Neurovascular status: Intact. No lower extremity swelling; No pain with calf  compression bilateral.  Musculoskeletal: No tenderness to palpation of the left hallux medial nail fold. + palpable bone spur at dorsolateral foot, + bunion deformity, + flexible pes planus,  Muscular strength within normal limits bilateral.   Assesement and Plan: Problem List Items Addressed This Visit    None    Visit Diagnoses    Status post nail surgery    -  Primary   Bone spur of foot       Diabetes mellitus without complication (HCC)       Toe pain, left           -Examined patient -Recommend daily inspection in setting of diabetes   -Using a sterile nipper trimmed medial nail keratosis and applied neosporin and advised patient to do the same for 2 days and advised patient that trauma line will grow out  -Recommend to monitor bone spur on top of left foot if rubs to apply padding and to loosen laces and to use topical pain cream as needed  -Patient is to return as needed or sooner if problems arise.  Landis Martins, DPM

## 2017-10-25 NOTE — Progress Notes (Signed)
Called patient regarding results of Chest sinus she does not have any further questions regarding this test. She only wants to know about the "specs"  On her CT chest. Routed messaged to MR for further discussion with patient. Per her request

## 2017-10-25 NOTE — Progress Notes (Signed)
Called patient regarding results of Chest CT, she is wanting further details about the "specs" she saw on the CT when she looked at it and wants Dr. Chase Caller to call her directly on cell phone at  [phone]  Will route to MR

## 2017-10-26 ENCOUNTER — Other Ambulatory Visit: Payer: Self-pay | Admitting: Family Medicine

## 2017-10-26 DIAGNOSIS — E7800 Pure hypercholesterolemia, unspecified: Secondary | ICD-10-CM

## 2017-10-26 NOTE — Progress Notes (Signed)
Chief Complaint  Patient presents with  . Generalized Body Aches    has been having aches and pains in her legs and mid back x 3 days. But does not have any other symptoms.   . Hyperlipidemia    patient is not fasting for med check today. She is not taking iron and is not on farxiga.   . Diabetes    we got a refill on her metformin today, she sees endo but she is telling me we shoudl refill this-shouldn't endo?   She was having sharp pain her leg 3 days ago, at her right groin/hip.  It has now moved to the left hip, and pain goes down the whole leg, "aching pain".  She also has some pain in the center of her back, since yesterday and "goes on down". No change in activity, or any known injury. Hip pain comes suddenly, intermittently, with standing/walking, lasts 5 minutes (the sharp, shooting pains at groin/hips). The other night it interfered with her sleep. Aching pain in the legs can last hours. No numbness/tingling/weakness in the legs (just numbness in the fingers).   She has been seeing local pulmonary clinic with complaints of increased coughing episodes. She last saw them in f/u on 4/18. She had CT chest high resolution which showed no evidence of interstitial lung disease,  there was mild airway trapping.  Also had CT sinus that showed minimum opacification of the right sphenoid sinus. Otherwise clear paranasal sinuses.   Saw podiatrist 2 days ago for some nail cracking in discomfort, s/p left hallux medial permanent nail avulsion performed on 08/02/2017. She trimmed medial nail keratosis.  She had seen [name] earlier this month with complaints of headache--Ddx caffeine withdrawal, sinus.  Labs were checked:   Chemistry   Labs(Brief)          Component Value Date/Time   NA 142 10/04/2017 1009   K 3.8 10/04/2017 1009   CL 97 10/04/2017 1009   CO2 25 10/04/2017 1009   BUN 17 10/04/2017 1009   CREATININE 0.96 10/04/2017 1009   CREATININE 1.09 06/30/2017 1455      Labs(Brief)          Component Value Date/Time   CALCIUM 10.2 10/04/2017 1009   ALKPHOS 96 10/04/2017 1009   AST 16 10/04/2017 1009   ALT 20 10/04/2017 1009   BILITOT 1.0 10/04/2017 1009       Glucose 108 RecentLabs       Lab Results  Component Value Date   IRON 77 10/04/2017   TIBC 275 10/04/2017   FERRITIN 184 (H) 10/04/2017     RecentLabs       Lab Results  Component Value Date   WBC 8.8 10/04/2017   HGB 12.6 10/04/2017   HCT 38.6 10/04/2017   MCV 88 10/04/2017   PLT 405 (H) 10/04/2017     RecentLabs       Lab Results  Component Value Date   TSH 1.380 10/04/2017      She also saw [name] in February and diagnosed with UTI (culture showed proteus mirabilis) and treated with Cipro. No further urinary symptoms.  Hypertension: She is currently taking 5mg  amlodipine, and HCTZ 25mg , as well as 30mEq of KCl (due to hypokalemia noted in 04/2017). Her last K+ was still low at 3.3 on last check at endocrinologist office in December, but normal on recent labs done here (see above) BP's are not checked elsewhere. She denies chest pain, palpitations, exertional dyspnea, edema. No longer having  headaches (see appt with [name] for HA's)  She has never been on ACE/ARB She had urine microalbumin/Cr ratio of <30 per Dr. Elyse Hsu in 04/2017.  Diabetes: She was referred to Dr. Elyse Hsu for uncontrolled DM. She is on chronic prednisone. She previously was on Metformin and [place]. Trulicity was recommended by Dr. Elyse Hsu at initial consult in October, but it wasn't started until her f/u appointment in January. She reports she stopped taking the [place] when the Trulicity was added (which is why there was no glucose in the last urine, when she saw [name]).  Last A1c was down from 10.2 in September, to 8.9% in 06/2017. She had normal foot exam in 04/2017, and sees podiatrist regularly.  She is due for eye exam, recently got a message to  schedule.  Hyperlipidemia: She reports compliance with atorvastatin. Last lipids 04/2017 were at goal, TC 111, LDL 52, HDL 45, TG 144, chol/HDL ratio 2.5. She needs refills of her medication. She is not fasting today.  Vitamin D deficiency: She is compliant with taking 2000 IU of D3 daily. Vitamin D-OH level was 52 in 04/2017  Extensive records reviewed to find lab results done elsewhere. Extensive review of records from her other providers.  PMH, PSH, SH reviewed  Outpatient Encounter Medications as of 10/27/2017  Medication Sig Note  . amLODipine (NORVASC) 5 MG tablet TAKE 1 TABLET BY MOUTH EVERY DAY   . atorvastatin (LIPITOR) 20 MG tablet TAKE 1 TABLET BY MOUTH EVERY DAY   . cholecalciferol (VITAMIN D) 1000 units tablet Take 1,000 Units by mouth daily. 03/15/2017: Takes 2/d  . Dulaglutide (TRULICITY) 2.54 YH/0.6CB SOPN Inject 0.75 mg into the skin daily.   . folic acid (FOLVITE) 1 MG tablet Take 1 mg by mouth.   . hydrochlorothiazide (HYDRODIURIL) 25 MG tablet Take 25 mg by mouth daily.   Marland Kitchen KLOR-CON M20 20 MEQ tablet TAKE 1 TABLET BY MOUTH EVERY DAY   . metFORMIN (GLUCOPHAGE) 1000 MG tablet TAKE 1 TABLET TWICE A DAY WITH A MEAL   . methotrexate 2.5 MG tablet Take 2.5 mg by mouth 2 (two) times a week. 10/27/2017: 7 tablets weekly  . predniSONE (DELTASONE) 20 MG tablet Take 5 mg by mouth daily with breakfast.  10/27/2017: Taking 2 10mg  tablets currently (tapered down from recently higher dose from pulmonary)  . FARXIGA 10 MG TABS tablet TAKE 1 TABLET BY MOUTH EVERY DAY (Patient not taking: Reported on 10/27/2017)   . [DISCONTINUED] benzonatate (TESSALON) 200 MG capsule Take 1 capsule (200 mg total) by mouth 3 (three) times daily as needed for cough.    No facility-administered encounter medications on file as of 10/27/2017.    Allergies reviewed, see list.  ROS:  No fever, chills, URI symptoms. Intermittent cough. Headaches resolved.  No sinus pain, purulent drainage.  No chest pain,  shortness of breath. No nausea, vomiting, urinary symptoms  +back pain, hip pain and aching in legs as per HPI.   PHYSICAL EXAM:  BP 110/68   Pulse 80   Temp 98.7 F (37.1 C) (Tympanic)   Ht 5\' 4"  (1.626 m)   Wt 186 lb 12.8 oz (84.7 kg)   LMP 10/09/2015   BMI 32.06 kg/m   Wt Readings from Last 3 Encounters:  10/20/17 187 lb (84.8 kg)  10/06/17 190 lb 9.6 oz (86.5 kg)  10/04/17 192 lb 3.2 oz (87.2 kg)   Well appearing female in no distress HEENT: conjunctiva and sclera are clear, EOMI.  Eyes appear to have mild proptosis (  not significantly changed) Neck: no lymphadenopathy, thyromegaly or spinal tenderness Back: Tender mid-thoracic spine. Not tender at rhomboids, but did feel better with stretches shown.  No tenderness elsewhere on spine, or paraspinous muscles, no spasm. No CVA tenderness Abdomen: soft, nontender, no mass Extremities: tender bilaterally at groin.  No lymphadenopathy appreciated. No pain with hip flexion, abduction ror adduction against resistance.  Normal pulses.  She has discomfort at groin with external rotation of hips, but has FROM No edema, normal pulses Skin: normal turgor, no rash Psych: normal mood, affect, hygiene and grooming Neuro: alert and oriented, cranial nerves intact.  DTR's 2+. Normal strength, sensation, gait. Negative SLR.   Recent CT and PET scan from 10/2015 had unremarkable skeletal findings.   ASSESSMENT/PLAN:  Essential hypertension - controlled on current regimen, continue - Plan: Basic metabolic panel  Pure hypercholesterolemia - will return for fasting labs. Continue statin - Plan: Lipid panel  Uncontrolled type 2 diabetes mellitus without complication, without long-term current use of insulin ([place]) - unsure whether the [place] was truly supposed to be stopped. Due to see endo, past due for ophtho. Refuses pneumovax  Relapsing polychondritis  Bilateral hip pain - check x-rays, given lack of reproducibility to suggest any  tendonitis. check chem (when fasting) to eval K+. Stay hydrated; stretches - Plan: DG HIPS BILAT WITH PELVIS 3-4 VIEWS  Medication monitoring encounter - Plan: Basic metabolic panel, Lipid panel  Acute midline thoracic back pain - fairly recent onset, no trauma, recent radiographic studies didn't note abnl. stretches, heat/ice. May need imaging if persists/worsens  Counseled extensively re: reasons pneumovax recommended, again declined.  Past due for diabetic eye exam, reminded to schedule. Reminded to schedule f/u with Dr. Elyse Hsu.    RETURN FOR FASTING LABS, to check lipids since we rx her statins, along with checking electrolytes given her aching in legs.   Shingrix rec age 69--check insurance coverage prior to physical F/u at [facility] 03/2018  Please schedule your diabetic eye exam and be sure that they send the reports to both Dr. Tomi Bamberger and Dr. Elyse Hsu.

## 2017-10-26 NOTE — Telephone Encounter (Signed)
Pt has an appt tomorrow for med check and sick visit. She has enough meds

## 2017-10-27 ENCOUNTER — Other Ambulatory Visit: Payer: Self-pay | Admitting: Family Medicine

## 2017-10-27 ENCOUNTER — Encounter: Payer: Self-pay | Admitting: Family Medicine

## 2017-10-27 ENCOUNTER — Ambulatory Visit: Payer: BLUE CROSS/BLUE SHIELD | Admitting: Family Medicine

## 2017-10-27 VITALS — BP 110/68 | HR 80 | Temp 98.7°F | Ht 64.0 in | Wt 186.8 lb

## 2017-10-27 DIAGNOSIS — Z5181 Encounter for therapeutic drug level monitoring: Secondary | ICD-10-CM | POA: Diagnosis not present

## 2017-10-27 DIAGNOSIS — E7800 Pure hypercholesterolemia, unspecified: Secondary | ICD-10-CM

## 2017-10-27 DIAGNOSIS — I10 Essential (primary) hypertension: Secondary | ICD-10-CM | POA: Diagnosis not present

## 2017-10-27 DIAGNOSIS — M941 Relapsing polychondritis: Secondary | ICD-10-CM | POA: Diagnosis not present

## 2017-10-27 DIAGNOSIS — E1165 Type 2 diabetes mellitus with hyperglycemia: Secondary | ICD-10-CM | POA: Diagnosis not present

## 2017-10-27 DIAGNOSIS — E119 Type 2 diabetes mellitus without complications: Secondary | ICD-10-CM

## 2017-10-27 DIAGNOSIS — M25552 Pain in left hip: Secondary | ICD-10-CM | POA: Diagnosis not present

## 2017-10-27 DIAGNOSIS — M546 Pain in thoracic spine: Secondary | ICD-10-CM | POA: Diagnosis not present

## 2017-10-27 DIAGNOSIS — IMO0001 Reserved for inherently not codable concepts without codable children: Secondary | ICD-10-CM

## 2017-10-27 DIAGNOSIS — M25551 Pain in right hip: Secondary | ICD-10-CM | POA: Diagnosis not present

## 2017-10-27 NOTE — Patient Instructions (Addendum)
Return for fasting labs tomorrow. Go today to [facility] ([address]) to get hip x-rays.  You can try heat vs ice, along with stretches for pain at your hips and back. You may use tylenol for pain.  Please schedule your diabetic eye exam and be sure that they send the reports to both Dr. Tomi Bamberger and Dr. Elyse Hsu.  You are also due to see Dr. Altheimer--please call and schedule your appointment with him. He should be filling all of your diabetes prescription medications for you.  I recommend getting the new shingles vaccine (Shingrix). You will need to check with your insurance to see if it is covered, and if covered by Medicare Part D, you need to get from the pharmacy rather than our office.  It is a series of 2 injections, spaced 2 months apart. You will be 52 (when the vaccine is incidated) by the time you come for you physical. Check with your insurance prior to your physical, and let us know if you would like to get one--there is a backorder and a waiting list.  Pneumonia vaccine is also recommended, and we would love to give it to you whenever you agree (you don't need to wait for your physical). Being on immunosuppressant drugs and having diabetes puts you at higher risks than other people your age, which is why it is recommended earlier.

## 2017-10-28 ENCOUNTER — Ambulatory Visit
Admission: RE | Admit: 2017-10-28 | Discharge: 2017-10-28 | Disposition: A | Discharge status: Home / Self Care | Payer: BLUE CROSS/BLUE SHIELD | Source: Ambulatory Visit | Attending: Family Medicine | Admitting: Family Medicine

## 2017-10-28 ENCOUNTER — Encounter: Payer: Self-pay | Admitting: Family Medicine

## 2017-10-28 ENCOUNTER — Other Ambulatory Visit: Payer: BLUE CROSS/BLUE SHIELD

## 2017-10-28 DIAGNOSIS — E7800 Pure hypercholesterolemia, unspecified: Secondary | ICD-10-CM

## 2017-10-28 DIAGNOSIS — Z5181 Encounter for therapeutic drug level monitoring: Secondary | ICD-10-CM

## 2017-10-28 DIAGNOSIS — I10 Essential (primary) hypertension: Secondary | ICD-10-CM

## 2017-10-28 DIAGNOSIS — M25552 Pain in left hip: Principal | ICD-10-CM

## 2017-10-28 DIAGNOSIS — M25551 Pain in right hip: Secondary | ICD-10-CM

## 2017-10-28 LAB — BASIC METABOLIC PANEL
BUN/Creatinine Ratio: 14 (ref 9–23)
BUN: 15 mg/dL (ref 6–24)
CALCIUM: 10 mg/dL (ref 8.7–10.2)
CO2: 28 mmol/L (ref 20–29)
Chloride: 101 mmol/L (ref 96–106)
Creatinine, Ser: 1.09 mg/dL — ABNORMAL HIGH (ref 0.57–1.00)
GFR calc Af Amer: 69 mL/min/{1.73_m2} (ref >59)
GFR, EST NON AFRICAN AMERICAN: 60 mL/min/{1.73_m2} (ref >59)
Glucose: 113 mg/dL — ABNORMAL HIGH (ref 65–99)
POTASSIUM: 3.8 mmol/L (ref 3.5–5.2)
Sodium: 144 mmol/L (ref 134–144)

## 2017-10-28 LAB — LIPID PANEL
CHOLESTEROL TOTAL: 107 mg/dL (ref 100–199)
Chol/HDL Ratio: 2.2 ratio (ref 0.0–4.4)
HDL: 49 mg/dL (ref >39)
LDL CALC: 34 mg/dL (ref 0–99)
Triglycerides: 120 mg/dL (ref 0–149)
VLDL CHOLESTEROL CAL: 24 mg/dL (ref 5–40)

## 2017-10-30 ENCOUNTER — Encounter: Payer: Self-pay | Admitting: Family Medicine

## 2017-11-01 NOTE — Telephone Encounter (Signed)
This patient needs to see Dr Tomi Bamberger when available for acutes.

## 2017-11-02 ENCOUNTER — Telehealth: Payer: Self-pay | Admitting: Internal Medicine

## 2017-11-02 NOTE — Telephone Encounter (Signed)
MR, please view the comments recorded on pt's HRCT. Pt is wanting you to call her to further discuss the results.

## 2017-11-04 NOTE — Telephone Encounter (Signed)
Please let JAYNIA FENDLEY know that I was planning to call her but leaving early 11/04/2017 due to personal issues. Will  Call next week  Dr. Brand Males, M.D., [facility].C.P Pulmonary and Critical Care Medicine Staff Physician, [place] Director - Interstitial Lung Disease  Program  Pulmonary [place] [facility] [place], [place], [address]  Pager: [phone], If no answer or between  15:00h - 7:00h: call [phone] Telephone: [phone]

## 2017-11-04 NOTE — Telephone Encounter (Signed)
lmtcb for pt. Routing back to MR to follow up on as pt wants to discuss this directly with him and not nursing staff.

## 2017-11-09 NOTE — Telephone Encounter (Signed)
Ct sinus and chest results in report given to patient Michelle Mack 11/09/2017 3:04 PM

## 2017-11-16 ENCOUNTER — Telehealth: Payer: Self-pay

## 2017-11-16 NOTE — Telephone Encounter (Signed)
Made in error

## 2017-11-16 NOTE — Telephone Encounter (Signed)
-----   Message from Brand Males, MD sent at 10/25/2017  8:55 AM EDT -----  IMPRESSION: 1. No findings to suggest interstitial lung disease. 2. Mild air trapping indicative of mild small airways disease.   Electronically Signed By: Vinnie Langton M.D. On: 10/19/2017 11:27

## 2017-11-25 ENCOUNTER — Other Ambulatory Visit: Payer: Self-pay | Admitting: Family Medicine

## 2017-11-25 DIAGNOSIS — E7800 Pure hypercholesterolemia, unspecified: Secondary | ICD-10-CM

## 2017-11-29 ENCOUNTER — Other Ambulatory Visit: Payer: Self-pay | Admitting: Family Medicine

## 2017-11-30 NOTE — Telephone Encounter (Signed)
Ok to refill 

## 2017-11-30 NOTE — Telephone Encounter (Signed)
Pt called to inquire about refill. Please fill asap. Pt is out.

## 2017-12-05 ENCOUNTER — Ambulatory Visit: Payer: BLUE CROSS/BLUE SHIELD | Admitting: Internal Medicine

## 2017-12-06 ENCOUNTER — Other Ambulatory Visit: Payer: Self-pay | Admitting: Family Medicine

## 2017-12-06 DIAGNOSIS — I10 Essential (primary) hypertension: Secondary | ICD-10-CM

## 2017-12-08 DIAGNOSIS — E7800 Pure hypercholesterolemia, unspecified: Secondary | ICD-10-CM | POA: Diagnosis not present

## 2017-12-08 DIAGNOSIS — E1165 Type 2 diabetes mellitus with hyperglycemia: Secondary | ICD-10-CM | POA: Diagnosis not present

## 2017-12-09 ENCOUNTER — Encounter: Payer: Self-pay | Admitting: Medical

## 2017-12-09 ENCOUNTER — Ambulatory Visit: Payer: BLUE CROSS/BLUE SHIELD | Admitting: Medical

## 2017-12-09 VITALS — BP 100/60 | HR 95 | Temp 98.1°F | Wt 188.4 lb

## 2017-12-09 DIAGNOSIS — K5900 Constipation, unspecified: Secondary | ICD-10-CM | POA: Diagnosis not present

## 2017-12-09 DIAGNOSIS — R1031 Right lower quadrant pain: Secondary | ICD-10-CM

## 2017-12-09 DIAGNOSIS — G8929 Other chronic pain: Secondary | ICD-10-CM | POA: Diagnosis not present

## 2017-12-09 DIAGNOSIS — R350 Frequency of micturition: Secondary | ICD-10-CM

## 2017-12-09 DIAGNOSIS — M79604 Pain in right leg: Secondary | ICD-10-CM

## 2017-12-09 LAB — POCT URINALYSIS DIP (PROADVANTAGE DEVICE)
BILIRUBIN UA: NEGATIVE
Glucose, UA: NEGATIVE mg/dL
Ketones, POC UA: NEGATIVE mg/dL
LEUKOCYTES UA: NEGATIVE
Nitrite, UA: NEGATIVE
Protein Ur, POC: NEGATIVE mg/dL
pH, UA: 6 (ref 5.0–8.0)

## 2017-12-09 MED ORDER — CIPROFLOXACIN HCL 500 MG PO TABS: 500.0000 mg | ORAL_TABLET | Freq: Two times a day (BID) | ORAL | 0 refills | Status: DC

## 2017-12-09 NOTE — Patient Instructions (Addendum)
Recommendations  Hydrate well with water  Drink some cranberry juice over the weekend  We will send urine for culture  If you have burning with urination, more frequency urinating, urgency to go, fever, worse belly pain, etc., over the weekend, then begin antibiotic for possible urinary tract infection  Consider using Miralax powder 1 cap full daily for constipation  Worse case scenario, you can use Miralax hourly for a few hours to help if backed up feeling  stretching daily and try to do exercise such as walking or bike daily to help with leg pain.    If not improving let us know  Of note, your hip xray from 10/28/17 was normal

## 2017-12-09 NOTE — Progress Notes (Signed)
Subjective: Chief Complaint  Patient presents with  . Flank Pain    right lower abdomen when drinking water, frequent urination   . Leg Pain    right    She notes few day hx/o pain in right hip/upper thigh/groin region.  Constant pain.  Pain radiates down right leg including lower leg in general.  No numbness or tingling in leg.    No back pain.   No injury, no fall.    Worse if drinking water.   Is having some urinary frequency, having some urgency.   No fever, no blood urine.   No odor urine.  Having some issues with bowels.  Has had to push really hard lately for BM.   No new recent medication changes.  No hx/o recurrent or frequent UTI.  She has hx/o fibroid and fibroid surgery prior.   She is not exercising currently.  No other aggravating or relieving factors. No other complaint.    Past Medical History:  Diagnosis Date  . Anemia    on iron  . Back pain    tx with ibuprofen 800mg   . Bronchitis    treated recently by abx  . Cough    non-productive  . DUB (dysfunctional uterine bleeding)   . Fibroid uterus    GYN--Dr. Rivard  . GERD (gastroesophageal reflux disease)   . Hypertension   . Infertility, female   . Polychondritis   . Recurrent upper respiratory infection (URI)    in October - tx with abx  . Type II or unspecified type diabetes mellitus without mention of complication, not stated as uncontrolled    diagnosed by Dr. Berdine Addison   Current Outpatient Medications on File Prior to Visit  Medication Sig Dispense Refill  . amLODipine (NORVASC) 5 MG tablet TAKE 1 TABLET BY MOUTH EVERY DAY 90 tablet 0  . atorvastatin (LIPITOR) 20 MG tablet TAKE 1 TABLET BY MOUTH EVERY DAY 90 tablet 0  . cholecalciferol (VITAMIN D) 1000 units tablet Take 1,000 Units by mouth daily.    . Dulaglutide (TRULICITY) 3.01 SW/1.0XN SOPN Inject 0.75 mg into the skin daily.    Marland Kitchen FARXIGA 10 MG TABS tablet TAKE 1 TABLET BY MOUTH EVERY DAY (Patient not taking: Reported on 10/27/2017) 90 tablet 0  . folic  acid (FOLVITE) 1 MG tablet Take 1 mg by mouth.    . hydrochlorothiazide (HYDRODIURIL) 25 MG tablet Take 25 mg by mouth daily.  3  . KLOR-CON M20 20 MEQ tablet TAKE 1 TABLET BY MOUTH EVERY DAY 30 tablet 0  . KLOR-CON M20 20 MEQ tablet TAKE 1 TABLET BY MOUTH EVERY DAY 30 tablet 2  . metFORMIN (GLUCOPHAGE) 1000 MG tablet TAKE 1 TABLET TWICE A DAY WITH A MEAL 180 tablet 1  . methotrexate 2.5 MG tablet Take 2.5 mg by mouth 2 (two) times a week.    . predniSONE (DELTASONE) 20 MG tablet Take 5 mg by mouth daily with breakfast.      No current facility-administered medications on file prior to visit.    ROS as in subjective   Objective: BP 100/60   Pulse 95   Temp 98.1 F (36.7 C) (Oral)   Wt 188 lb 6.4 oz (85.5 kg)   LMP 10/09/2015   SpO2 97%   BMI 32.34 kg/m   General appearance: alert, no distress, WD/WN,  Back: mild paraspinal lumbar tenderness, but full ROM.  There is some pain with flexion and extension, no back deformity Abdomen: +bs, soft, mild RLQ tenderness,  no rebound,otherwise non tender, non distended, no masses, no hepatomegaly, no splenomegaly Pulses: 2+ symmetric, upper and lower extremities, normal cap refill Legs nontender, normal hip and knee ROM Neuro: legs with normal strength, sensation, DTRs. Ext: no edema    Assessment: Encounter Diagnoses  Name Primary?  . Frequent urination Yes  . Groin pain, chronic, right   . Leg pain, right   . Urinary frequency      Plan: We discussed her concerns.  She seems to have 2 different types of pain.  She seems to have some radicular right leg pain/hip pain that was also noted back in March when she saw Dr. Tomi Bamberger.  Hip x-rays reviewed which are normal.  Prior CT 2017 showed uterine fibroids.  She is also on Farxiga which increases urinary frequency.  We will send urine for culture.  Advised if worse urine symptoms this weekend suggesting infection to begin Cipro.  Otherwise hydrate well, continue cranberry juice, and let  us await culture.  Encouraged her to exercise regularly with stretching in aerobic activity particular the legs and back.  Constipation - advised OTC miralax over the weekend 1 cap full daily.  F/u if not resolved in a week.  Aracelis was seen today for flank pain and leg pain.  Diagnoses and all orders for this visit:  Frequent urination -     POCT Urinalysis DIP (Proadvantage Device) -     Urine Culture  Groin pain, chronic, right  Leg pain, right  Urinary frequency  Other orders -     ciprofloxacin (CIPRO) 500 MG tablet; Take 1 tablet (500 mg total) by mouth 2 (two) times daily.

## 2017-12-10 LAB — URINE CULTURE

## 2017-12-13 DIAGNOSIS — E7800 Pure hypercholesterolemia, unspecified: Secondary | ICD-10-CM | POA: Diagnosis not present

## 2017-12-13 DIAGNOSIS — I10 Essential (primary) hypertension: Secondary | ICD-10-CM | POA: Diagnosis not present

## 2017-12-13 DIAGNOSIS — Z7952 Long term (current) use of systemic steroids: Secondary | ICD-10-CM | POA: Diagnosis not present

## 2017-12-13 DIAGNOSIS — E1165 Type 2 diabetes mellitus with hyperglycemia: Secondary | ICD-10-CM | POA: Diagnosis not present

## 2017-12-14 LAB — HEMOGLOBIN A1C: Hemoglobin A1C: 8.2

## 2017-12-25 ENCOUNTER — Encounter: Payer: Self-pay | Admitting: Internal Medicine

## 2017-12-26 DIAGNOSIS — Z01419 Encounter for gynecological examination (general) (routine) without abnormal findings: Secondary | ICD-10-CM | POA: Diagnosis not present

## 2017-12-26 DIAGNOSIS — Z1211 Encounter for screening for malignant neoplasm of colon: Secondary | ICD-10-CM | POA: Diagnosis not present

## 2017-12-26 DIAGNOSIS — Z1231 Encounter for screening mammogram for malignant neoplasm of breast: Secondary | ICD-10-CM | POA: Diagnosis not present

## 2017-12-26 DIAGNOSIS — Z124 Encounter for screening for malignant neoplasm of cervix: Secondary | ICD-10-CM | POA: Diagnosis not present

## 2017-12-27 ENCOUNTER — Other Ambulatory Visit: Payer: Self-pay | Admitting: Family Medicine

## 2017-12-27 DIAGNOSIS — E7800 Pure hypercholesterolemia, unspecified: Secondary | ICD-10-CM

## 2017-12-28 LAB — HM PAP SMEAR: HM Pap smear: NEGATIVE

## 2017-12-28 LAB — RESULTS CONSOLE HPV: CHL HPV: NEGATIVE

## 2018-01-16 ENCOUNTER — Encounter: Payer: Self-pay | Admitting: *Deleted

## 2018-01-16 ENCOUNTER — Encounter: Payer: Self-pay | Admitting: Family Medicine

## 2018-01-30 DIAGNOSIS — Z79899 Other long term (current) drug therapy: Secondary | ICD-10-CM | POA: Diagnosis not present

## 2018-01-30 DIAGNOSIS — M941 Relapsing polychondritis: Secondary | ICD-10-CM | POA: Diagnosis not present

## 2018-01-30 DIAGNOSIS — R05 Cough: Secondary | ICD-10-CM | POA: Diagnosis not present

## 2018-02-28 ENCOUNTER — Other Ambulatory Visit: Payer: Self-pay | Admitting: Family Medicine

## 2018-02-28 NOTE — Telephone Encounter (Signed)
Had normal K+ 12/14/17. Has appt in Sept

## 2018-02-28 NOTE — Telephone Encounter (Signed)
Is this okay to refill? 

## 2018-03-05 ENCOUNTER — Other Ambulatory Visit: Payer: Self-pay | Admitting: Family Medicine

## 2018-03-05 DIAGNOSIS — E119 Type 2 diabetes mellitus without complications: Secondary | ICD-10-CM

## 2018-03-06 ENCOUNTER — Other Ambulatory Visit: Payer: Self-pay | Admitting: Family Medicine

## 2018-03-06 DIAGNOSIS — E7800 Pure hypercholesterolemia, unspecified: Secondary | ICD-10-CM

## 2018-03-07 NOTE — Telephone Encounter (Signed)
Pt sees endocrinology 

## 2018-03-14 ENCOUNTER — Ambulatory Visit: Payer: BLUE CROSS/BLUE SHIELD | Admitting: Sports Medicine

## 2018-03-14 ENCOUNTER — Encounter: Payer: Self-pay | Admitting: Sports Medicine

## 2018-03-14 DIAGNOSIS — M79675 Pain in left toe(s): Secondary | ICD-10-CM | POA: Diagnosis not present

## 2018-03-14 DIAGNOSIS — B351 Tinea unguium: Secondary | ICD-10-CM

## 2018-03-14 DIAGNOSIS — E119 Type 2 diabetes mellitus without complications: Secondary | ICD-10-CM

## 2018-03-14 DIAGNOSIS — L603 Nail dystrophy: Secondary | ICD-10-CM | POA: Diagnosis not present

## 2018-03-14 DIAGNOSIS — M79674 Pain in right toe(s): Secondary | ICD-10-CM

## 2018-03-14 NOTE — Progress Notes (Signed)
Subjective: Michelle Mack is a 52 y.o. female patient with history of diabetes who presents to office today complaining of long,mildly painful nails while ambulating in shoes; unable to trim. Patient states that her left toenail is spliting and she is afraid to cut it because she is diabetic. Patient did not check her FBS today and reports that her last A1C was 8.2.   Patient Active Problem List   Diagnosis Date Noted  . 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 Fibroid, uterine 11/13/2015  . Mediastinal adenopathy 10/23/2015  . Collapse of left lung 10/23/2015  . Relapsing polychondritis 10/23/2015  . Type 2 diabetes mellitus, controlled, with renal complications ([place]) 02/40/9735  . CKD (chronic kidney disease) stage 2, GFR 60-89 ml/min 11/06/2014  . Type 2 diabetes mellitus without complication ([place]) 32/99/2426  . Essential hypertension 05/22/2014  . Chronic polychondritis 05/02/2014  . Dysfunctional uterine bleeding 04/29/2014  . Obstructive apnea 04/29/2014  . Other specified respiratory disorders 04/29/2014  . Microscopic hematuria 04/12/2014  . 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 Positive ANA (antinuclear antibody) 02/26/2014  . Vitamin D deficiency 07/31/2013  . Obesity (BMI 30-39.9)   . Impaired fasting glucose 05/24/2012  . Fibroid uterus 05/24/2012  . Infertility, female   . DUB (dysfunctional uterine bleeding)   . Chronic cough 05/25/2011  . GERD (gastroesophageal reflux disease) 05/25/2011  . Anemia 05/25/2011  . Essential hypertension, benign 04/05/2011   Current Outpatient Medications on File Prior to Visit  Medication Sig Dispense Refill  . amLODipine (NORVASC) 5 MG tablet TAKE 1 TABLET BY MOUTH EVERY DAY 90 tablet 0  . atorvastatin (LIPITOR) 20 MG tablet TAKE 1 TABLET BY MOUTH EVERY DAY 90 tablet 0  . cholecalciferol (VITAMIN D) 1000 units tablet Take 1,000 Units by mouth daily.    . ciprofloxacin (CIPRO) 500 MG tablet Take 1 tablet (500 mg total) by mouth 2 (two) times  daily. 6 tablet 0  . Dulaglutide (TRULICITY) 8.34 HD/6.2IW SOPN Inject 0.75 mg into the skin daily.    Marland Kitchen FARXIGA 10 MG TABS tablet TAKE 1 TABLET BY MOUTH EVERY DAY 90 tablet 0  . folic acid (FOLVITE) 1 MG tablet Take 1 mg by mouth.    . hydrochlorothiazide (HYDRODIURIL) 25 MG tablet Take 25 mg by mouth daily.  3  . KLOR-CON M20 20 MEQ tablet TAKE 1 TABLET BY MOUTH EVERY DAY 90 tablet 0  . metFORMIN (GLUCOPHAGE) 1000 MG tablet TAKE 1 TABLET TWICE A DAY WITH A MEAL 180 tablet 1  . methotrexate 2.5 MG tablet Take 2.5 mg by mouth 2 (two) times a week.    . predniSONE (DELTASONE) 20 MG tablet Take 5 mg by mouth daily with breakfast.      No current facility-administered medications on file prior to visit.     No results found for this or any previous visit (from the past 2160 hour(s)).  Objective: General: Patient is awake, alert, and oriented x 3 and in no acute distress.  Integument: Skin is warm, dry and supple bilateral. Nails are tender, long, thickened and dystrophic with subungual debris, consistent with onychomycosis, 1-5 bilateral; previous left great toenail procedure site is well healed with mild lifting of nail distally. No signs of infection. No open lesions or preulcerative lesions present bilateral. Remaining integument unremarkable.  Vasculature:  Dorsalis Pedis pulse 1/4 bilateral. Posterior Tibial pulse  1/4 bilateral. Capillary fill time <3 sec 1-5 bilateral. Positive hair growth to the level of the digits.Temperature gradient within normal limits. No varicosities present bilateral. No edema  present bilateral.   Neurology: The patient has intact sensation measured with a 5.07/10g Semmes Weinstein Monofilament at all pedal sites bilateral . Vibratory sensation diminished bilateral with tuning fork. No Babinski sign present bilateral.   Musculoskeletal: Asymptomatic pes planus pedal deformities noted bilateral. Muscular strength 5/5 in all lower extremity muscular groups bilateral  without pain on range of motion. No tenderness with calf compression bilateral.  Assessment and Plan: Problem List Items Addressed This Visit    None    Visit Diagnoses    Pain due to onychomycosis of toenails of both feet    -  Primary   Nail dystrophy       Diabetes mellitus without complication ([place])         -Examined patient. -Discussed and educated patient on diabetic foot care, especially with  regards to the vascular, neurological and musculoskeletal systems.  -Stressed the importance of good glycemic control and the detriment of not  controlling glucose levels in relation to the foot. -Mechanically debrided all nails 1-5 bilateral using sterile nail nipper and filed with dremel without incident  -Answered all patient questions -Patient to return  in 2.5 to 3 months for at risk foot care -Patient advised to call the office if any problems or questions arise in the meantime.  Landis Martins, DPM

## 2018-03-15 NOTE — Progress Notes (Signed)
Chief Complaint  Patient presents with  . Annual Exam    fasting annual exam no pap-sees Dr. Cletis Media and had one in June. Seeing Dr. Elyse Hsu today. Last Tdap 2009. Never had pneumovax, doesn not want flu vaccine. Does want something for hot flashes. No other concerns.     Michelle Mack is a 52 y.o. female who presents for a complete physical.  She is complaining of hot flashes. She sees GYN (last in June), has not discussed with her.  Hypertension: She is currently taking 54m amlodipine, and HCTZ 259m as well as 2085mof KCl (due to hypokalemia noted in 04/2017). BP's are not checked elsewhere, just other doctors. She denies chest pain, palpitations, exertional dyspnea, edema, headaches.  Some muscle cramps in her hands. Lab Results  Component Value Date   K 3.8 10/28/2017  She had b-met, LFT's done 7/29 at [facility]--K+3.6, glu 127, rest of panel normal. In looking at her bottle, she still has a couple of pills left from bottle filled end of May (never picked up the refill that was sent to pharmacy the end of August), so suspect some missed pills.  She has never been on ACE/ARB She had urine microalbumin/Cr ratio of <30 per Dr. AltElyse Hsu 04/2017. Other doctors have mentioned these medications to her (including her nephro), but nobody has switched.  Diabetes: She was referred to Dr. AltElyse Hsur uncontrolled DM. She is on chronic prednisone. She previously was on Metformin and FarIranrulicity was recommended by Dr. AltElyse Hsu initial consult in October, as well as in January, but didn't actually start it until her last visit with him this past June.  She is tolerating medication without side effects, and reports compliance. Last A1c was 8.2% in June (prior to starting Trulicity). FarWilder Glades stopped (vaginitis, UTI). She remains on metformin, however she did run out a week ago (refill request came here, never got from Dr. AltElyse Hsuidn't request refill from him). She sees  podiatrist regularly, seen earlier this week.  Hyperlipidemia: She reports compliance with atorvastatin. Last lipids were done 12/2017 and were at goal, LDL of 48. Denies side effects.  Vitamin D deficiency: She is compliant with taking 2000 IU of D3 daily. She has been taking 2 tablets of 1000 IU daily (getting by rx, asking for refill). Vitamin D-OH level was 52 in 04/2017  OSA: on CPAP, reports compliance.    Other doctors caring for patient: Endo: Dr. AltElyse HsuN: Dr. RivCletis Mediaeum: Dr. AngVeneta PentonF, for relapsing polychondritis) Pulm:  Dr. NamEarly OsmondF) and Ramaswamy Podiatrist: Dr. StoCannon Kettleunions) GI: Dr. ManCollene Maresntist: can't recall name, goes once yearly Ophtho: [facility]. [facility]Manuella [facility])   Immunization History  Administered Date(s) Administered  . Hepatitis A 12/27/2007, 01/24/2008, 07/17/2008  . Hepatitis B 12/27/2007, 01/24/2008, 07/17/2008  . IPV 12/27/2007  . MMR 12/27/2007  . Meningococcal Polysaccharide 12/27/2007  . PPD Test 04/14/2011, 06/08/2013, 01/01/2015, 03/08/2017  . Td 12/28/1995  . Tdap 12/27/2007  . Typhoid Inactivated 12/27/2007  . Varicella 12/27/2007  . Yellow Fever 01/24/2008   Last Pap smear:12/2017 by Dr. RivCletis Mediast mammogram: 01/2017 Last colonoscopy: 12/2016 with Dr. ManCollene Maresnternal hemorrhoids, repeat 10 years Last DEXA: never Dentist: goes yearly Ophtho: yearly in October Exercise: Walks some (stopped going to the gym last year).  Past Medical History:  Diagnosis Date  . Anemia    on iron  . Back pain    tx with ibuprofen 800m56m Bronchitis  treated recently by abx  . Cough    non-productive  . DUB (dysfunctional uterine bleeding)   . Fibroid uterus    GYN--Dr. Rivard  . GERD (gastroesophageal reflux disease)   . Hypertension   . Infertility, female   . Polychondritis   . Recurrent upper respiratory infection (URI)    in October - tx with abx  . Type II or unspecified type diabetes  mellitus without mention of complication, not stated as uncontrolled    diagnosed by Dr. Berdine Addison    Past Surgical History:  Procedure Laterality Date  . BRONCHOSCOPY  01/2014   at [facility] expiratory collapse of trachea and focal narrowing in L mainstem bronchus and LUL  . ENDOMETRIAL VAPORIZATION W/ VERSAPOINT    . MYOMECTOMY  2002   Social History   Socioeconomic History  . Marital status: Married    Spouse name: Not on file  . Number of children: Not on file  . Years of education: Not on file  . Highest education level: Not on file  Occupational History  . Occupation: Therapist, art at [employer]: [employer]  Social Needs  . Financial resource strain: Not on file  . Food insecurity:    Worry: Not on file    Inability: Not on file  . Transportation needs:    Medical: Not on file    Non-medical: Not on file  Tobacco Use  . Smoking status: Never Smoker  . Smokeless tobacco: Never Used  Substance and Sexual Activity  . Alcohol use: Yes    Comment: 0-1 drink/week  . Drug use: No  . Sexual activity: Yes    Birth control/protection: None  Lifestyle  . Physical activity:    Days per week: Not on file    Minutes per session: Not on file  . Stress: Not on file  Relationships  . Social connections:    Talks on phone: Not on file    Gets together: Not on file    Attends religious service: Not on file    Active member of club or organization: Not on file    Attends meetings of clubs or organizations: Not on file    Relationship status: Not on file  Other Topics Concern  . Not on file  Social History Narrative   Lives with husband, foster daughter (25yo, moved in with them 03/2017, in a day program). No pets. +passive tobacco exposure (husband and foster daughter, smoke outside)     Family History  Problem Relation Age of Onset  . Hypertension Mother   . Cancer Mother        pancreatic cancer  . Diabetes Mother   . Hypertension Father   . Diabetes  Father   . Cancer Father        prostate cancer  . Hypertension Sister   . Thyroid nodules Sister   . Diabetes Maternal Grandmother   . Breast cancer Maternal Grandmother   . Heart disease Maternal Grandmother   . Heart disease Paternal Grandmother     Outpatient Encounter Medications as of 03/16/2018  Medication Sig Note  . amLODipine (NORVASC) 5 MG tablet Take 1 tablet (5 mg total) by mouth daily.   Marland Kitchen atorvastatin (LIPITOR) 20 MG tablet TAKE 1 TABLET BY MOUTH EVERY DAY   . Dulaglutide (TRULICITY) 6.14 ER/1.5QM SOPN Inject 0.75 mg into the skin daily.   . folic acid (FOLVITE) 1 MG tablet Take 1 mg by mouth.   . hydrochlorothiazide (HYDRODIURIL) 25 MG  tablet Take 25 mg by mouth daily.   Marland Kitchen KLOR-CON M20 20 MEQ tablet TAKE 1 TABLET BY MOUTH EVERY DAY   . methotrexate 2.5 MG tablet Take 2.5 mg by mouth 2 (two) times a week. 10/27/2017: 7 tablets weekly  . predniSONE (DELTASONE) 20 MG tablet Take 5 mg by mouth daily with breakfast.  10/27/2017: Taking 2 68m tablets currently (tapered down from recently higher dose from pulmonary)  . [DISCONTINUED] amLODipine (NORVASC) 5 MG tablet TAKE 1 TABLET BY MOUTH EVERY DAY   . [DISCONTINUED] cholecalciferol (VITAMIN D) 1000 units tablet Take 1,000 Units by mouth daily. 03/15/2017: Takes 2/d  . [DISCONTINUED] FARXIGA 10 MG TABS tablet TAKE 1 TABLET BY MOUTH EVERY DAY   . Cholecalciferol (VITAMIN D) 2000 units tablet Take 1 tablet (2,000 Units total) by mouth daily.   . metFORMIN (GLUCOPHAGE) 1000 MG tablet TAKE 1 TABLET TWICE A DAY WITH A MEAL (Patient not taking: Reported on 03/16/2018)   . [DISCONTINUED] ciprofloxacin (CIPRO) 500 MG tablet Take 1 tablet (500 mg total) by mouth 2 (two) times daily.    No facility-administered encounter medications on file as of 03/16/2018.     Allergies--see list, reviewed.   ROS: The patient denies anorexia, weight changes, headaches,  vision changes, decreased hearing, ear pain, sore throat, breast concerns, chest  pain, palpitations, dizziness, syncope, dyspnea on exertion, cough, swelling, nausea, vomiting, diarrhea, abdominal pain, melena, hematochezia, indigestion/heartburn, hematuria, dysuria,vaginal bleeding, vaginal discharge, odor or itch, genital lesions, joint pains, numbness, tingling, weakness, tremor, suspicious skin lesions, depression, anxiety, abnormal bleeding/bruising, or enlarged lymph nodes. No dysuria. Chronic microscopic hematuria. +mild constipation. Some hemorrhoidal bleeding, occasionally after straining    PHYSICAL EXAM:  BP 110/74   Pulse 72   Ht 5' 3.75" (1.619 m)   Wt 188 lb 3.2 oz (85.4 kg)   LMP 10/09/2015   BMI 32.56 kg/m   Wt Readings from Last 3 Encounters:  03/16/18 188 lb 3.2 oz (85.4 kg)  12/09/17 188 lb 6.4 oz (85.5 kg)  10/27/17 186 lb 12.8 oz (84.7 kg)    General Appearance:    Alert, cooperative, no distress, appears stated age  Head:    Normocephalic, without obvious abnormality, atraumatic  Eyes:    PERRL, conjunctiva/corneas clear, EOM's intact, fundi benign. Slight proptotic appearance of eyes, chronic  Ears:    Normal TM's and external ear canals  Nose:   Nares normal, mucosa normal, no drainage or sinus tenderness  Throat:   Lips, mucosa, and tongue normal; teeth and gums normal  Neck:   Supple, no lymphadenopathy;  thyroid:  no enlargement/ tenderness/nodules; no carotid bruit or JVD  Back:    Spine nontender, no curvature, ROM normal, no CVA tenderness  Lungs:     Clear to auscultation bilaterally without wheezes, rales or ronchi; respirations unlabored  Chest Wall:    No tenderness or deformity   Heart:    Regular rate and rhythm, S1 and S2 normal, no murmur, rub or gallop  Breast Exam:    Deferred to GYN  Abdomen:     Soft, non-tender, nondistended, normoactive bowel sounds, no masses, no hepatosplenomegaly  Genitalia:    Deferred to GYN     Extremities:   No clubbing, cyanosis or edema. +bunions bilaterally and discolored right 2nd  toenail  Pulses:   2+ and symmetric all extremities  Skin:   Skin color, texture, turgor normal, no rashes or lesions  Lymph nodes:   Cervical, supraclavicular, and axillary nodes normal  Neurologic:  CNII-XII intact, normal strength, sensation and gait; reflexes 2+ and symmetric throughout                                Psych:   Normal mood, affect, hygiene and grooming   01/30/18 labs reviewed from [facility]--CBC, LFT's, b-met Labs performed by Dr. Elyse Hsu in 12/2017 reviewed. Last TSH 04/2017.   ASSESSMENT/PLAN:  Annual physical exam - Plan: POCT Urinalysis DIP (Proadvantage Device), Visual acuity screening  Essential hypertension, benign - BP controlled on current regimen; likely would benefit by changing/adding ARB. check urine microalb. - Plan: amLODipine (NORVASC) 5 MG tablet  Uncontrolled type 2 diabetes mellitus without complication, without long-term current use of insulin (HCC) - due to see Dr. Elyse Hsu today. out of metformin x 1 week but reportedly compliant with Trulicity. Pneumovax and flu shots rec, declined - Plan: Microalbumin / creatinine urine ratio  Vitamin D deficiency - Plan: Cholecalciferol (VITAMIN D) 2000 units tablet  Constipation, unspecified constipation type - with intermittent hemorrhoidal bleeding. Encouraged fluids, fiber intake, regular exercise, stool softeners  Relapsing polychondritis - stable/controlled   2000 IU #100 sent to pharmacy, can compare prices of this vs OTC.  Flu shot, Tdap, Pneumovax all recommended to be given today. Risks/side effects and reasons they are recommended were reviewed in great detail.  She initially was willing to get TdaP, but ultimately refused all vaccines.  Strongly encouraged her to change mind, and advised that she can return for NV at her convenience, not to wait until next physical. shingrix also recommended--to check insurance coverage and return for NV if/when desired. Risks/SE reviewed.  Urine  microalbumin Discussed ACEI and ARB's in detail--would benefit changing even if microalbumin is normal.  Just need to make sure that she would be compliant in close f/u on BP, labs.  F/u with Altheimer scheduled for today, will let him do A1c   Discussed monthly self breast exams and yearly mammograms; at least 30 minutes of aerobic activity at least 5 days/week, weight-bearing exercise at least 2x/wk; proper sunscreen use reviewed; healthy diet, including goals of calcium and vitamin D intake and alcohol recommendations (less than or equal to 1 drink/day) reviewed; regular seatbelt use; changing batteries in smoke detectors, recommend carbon monoxide detectors.  Immunization recommendations discussed--flu shots yearly, pneumovax and TdaP all recommended today, but declined. Shingrix recommended (to check insurance).  Colonoscopy recommendations reviewed, UTD, due again 2028.  F/u 1 year for CPE F/u sooner prn. Consider f/u sooner to change to ARB (unless other docs willing to do), and NV for vaccines--strongly recommended. Spent over 15 minutes counseling specifically on vaccines today, needed due to her DM, chronic prednisone use, lung disease.

## 2018-03-16 ENCOUNTER — Encounter: Payer: Self-pay | Admitting: Family Medicine

## 2018-03-16 ENCOUNTER — Ambulatory Visit (INDEPENDENT_AMBULATORY_CARE_PROVIDER_SITE_OTHER): Payer: BLUE CROSS/BLUE SHIELD | Admitting: Family Medicine

## 2018-03-16 VITALS — BP 110/74 | HR 72 | Ht 63.75 in | Wt 188.2 lb

## 2018-03-16 DIAGNOSIS — E1165 Type 2 diabetes mellitus with hyperglycemia: Secondary | ICD-10-CM

## 2018-03-16 DIAGNOSIS — K5900 Constipation, unspecified: Secondary | ICD-10-CM | POA: Diagnosis not present

## 2018-03-16 DIAGNOSIS — IMO0001 Reserved for inherently not codable concepts without codable children: Secondary | ICD-10-CM

## 2018-03-16 DIAGNOSIS — E559 Vitamin D deficiency, unspecified: Secondary | ICD-10-CM

## 2018-03-16 DIAGNOSIS — Z0000 Encounter for general adult medical examination without abnormal findings: Secondary | ICD-10-CM

## 2018-03-16 DIAGNOSIS — I10 Essential (primary) hypertension: Secondary | ICD-10-CM | POA: Diagnosis not present

## 2018-03-16 DIAGNOSIS — E7800 Pure hypercholesterolemia, unspecified: Secondary | ICD-10-CM | POA: Diagnosis not present

## 2018-03-16 DIAGNOSIS — M941 Relapsing polychondritis: Secondary | ICD-10-CM

## 2018-03-16 LAB — POCT URINALYSIS DIP (PROADVANTAGE DEVICE)
Bilirubin, UA: NEGATIVE
Glucose, UA: NEGATIVE mg/dL
Ketones, POC UA: NEGATIVE mg/dL
Leukocytes, UA: NEGATIVE
NITRITE UA: NEGATIVE
PH UA: 6 (ref 5.0–8.0)
PROTEIN UA: NEGATIVE mg/dL
Specific Gravity, Urine: 1.02
UUROB: NEGATIVE

## 2018-03-16 MED ORDER — AMLODIPINE BESYLATE 5 MG PO TABS: 5.0000 mg | ORAL_TABLET | Freq: Every day | ORAL | 1 refills | Status: DC

## 2018-03-16 MED ORDER — VITAMIN D 50 MCG (2000 UT) PO TABS: 2000.0000 [IU] | ORAL_TABLET | Freq: Every day | ORAL | 3 refills | Status: DC

## 2018-03-16 NOTE — Patient Instructions (Addendum)
HEALTH MAINTENANCE RECOMMENDATIONS:  It is recommended that you get at least 30 minutes of aerobic exercise at least 5 days/week (for weight loss, you may need as much as 60-90 minutes). This can be any activity that gets your heart rate up. This can be divided in 10-15 minute intervals if needed, but try and build up your endurance at least once a week.  Weight bearing exercise is also recommended twice weekly.  Eat a healthy diet with lots of vegetables, fruits and fiber.  "Colorful" foods have a lot of vitamins (ie green vegetables, tomatoes, red peppers, etc).  Limit sweet tea, regular sodas and alcoholic beverages, all of which has a lot of calories and sugar.  Up to 1 alcoholic drink daily may be beneficial for women (unless trying to lose weight, watch sugars).  Drink a lot of water.  Calcium recommendations are 1200-1500 mg daily (1500 mg for postmenopausal women or women without ovaries), and vitamin D 1000 IU daily.  This should be obtained from diet and/or supplements (vitamins), and calcium should not be taken all at once, but in divided doses.  Monthly self breast exams and yearly mammograms for women over the age of 78 is recommended.  Sunscreen of at least SPF 30 should be used on all sun-exposed parts of the skin when outside between the hours of 10 am and 4 pm (not just when at beach or pool, but even with exercise, golf, tennis, and yard work!)  Use a sunscreen that says "broad spectrum" so it covers both UVA and UVB rays, and make sure to reapply every 1-2 hours.  Remember to change the batteries in your smoke detectors when changing your clock times in the spring and fall.  Use your seat belt every time you are in a car, and please drive safely and not be distracted with cell phones and texting while driving.  Please make sure that your eye doctor sends [place] a report of your exam (as well as Dr. Elyse Hsu).  I recommend getting the new shingles vaccine (Shingrix). You will need  to check with your insurance to see if it is covered, and if covered by Medicare Part D, you need to get from the pharmacy rather than our office.  It is a series of 2 injections, spaced 2 months apart.  [facility] and schedule your yearly mammogram.  You are due for tetanus (TdaP), pneumovax and a flu shot. We discussed these in great detail today, but you declined to get them at today's visit. I highly recommend that you get these in the near future.  Call for a nurse visit, vs get at one of your other doctor's visits and let us know when you get them.  We also discussed the shingles vaccine (see above).  Over-the-counter herbal supplements containing soy and black cohosh might help with your hot flashes (ie Estroven; there is a new one called Amberen that I have seen advertised, but have no experience with).   Constipation, Adult Constipation is when a person has fewer bowel movements in a week than normal, has difficulty having a bowel movement, or has stools that are dry, hard, or larger than normal. Constipation may be caused by an underlying condition. It may become worse with age if a person takes certain medicines and does not take in enough fluids. Follow these instructions at home: Eating and drinking   Eat foods that have a lot of fiber, such as fresh fruits and vegetables, whole grains,  and beans.  Limit foods that are high in fat, low in fiber, or overly processed, such as french fries, hamburgers, cookies, candies, and soda.  Drink enough fluid to keep your urine clear or pale yellow. General instructions  Exercise regularly or as told by your health care provider.  Go to the restroom when you have the urge to go. Do not hold it in.  Take over-the-counter and prescription medicines only as told by your health care provider. These include any fiber supplements.  Practice pelvic floor retraining exercises, such as deep breathing while relaxing the lower  abdomen and pelvic floor relaxation during bowel movements.  Watch your condition for any changes.  Keep all follow-up visits as told by your health care provider. This is important. Contact a health care provider if:  You have pain that gets worse.  You have a fever.  You do not have a bowel movement after 4 days.  You vomit.  You are not hungry.  You lose weight.  You are bleeding from the anus.  You have thin, pencil-like stools. Get help right away if:  You have a fever and your symptoms suddenly get worse.  You leak stool or have blood in your stool.  Your abdomen is bloated.  You have severe pain in your abdomen.  You feel dizzy or you faint. This information is not intended to replace advice given to you by your health care provider. Make sure you discuss any questions you have with your health care provider. Document Released: 03/19/2004 Document Revised: 01/09/2016 Document Reviewed: 12/10/2015 Elsevier Interactive Patient Education  2018 [employer].

## 2018-03-17 LAB — MICROALBUMIN / CREATININE URINE RATIO
CREATININE, UR: 142.4 mg/dL
Microalb/Creat Ratio: 2.5 mg/g creat (ref 0.0–30.0)
Microalbumin, Urine: 3.5 ug/mL

## 2018-03-23 ENCOUNTER — Telehealth: Payer: Self-pay | Admitting: Family Medicine

## 2018-03-23 NOTE — Telephone Encounter (Signed)
Received requested records from [facility]. Pap was received. Sending back for review.

## 2018-03-27 DIAGNOSIS — G4733 Obstructive sleep apnea (adult) (pediatric): Secondary | ICD-10-CM | POA: Diagnosis not present

## 2018-03-27 DIAGNOSIS — R05 Cough: Secondary | ICD-10-CM | POA: Diagnosis not present

## 2018-03-27 DIAGNOSIS — Z79899 Other long term (current) drug therapy: Secondary | ICD-10-CM | POA: Diagnosis not present

## 2018-03-27 DIAGNOSIS — M941 Relapsing polychondritis: Secondary | ICD-10-CM | POA: Diagnosis not present

## 2018-04-06 ENCOUNTER — Ambulatory Visit: Payer: BLUE CROSS/BLUE SHIELD | Admitting: Family Medicine

## 2018-04-13 ENCOUNTER — Ambulatory Visit: Payer: BLUE CROSS/BLUE SHIELD | Admitting: Medical

## 2018-04-13 ENCOUNTER — Encounter: Payer: Self-pay | Admitting: Medical

## 2018-04-13 VITALS — BP 110/70 | HR 86 | Temp 98.3°F | Resp 16 | Ht 64.0 in | Wt 189.8 lb

## 2018-04-13 DIAGNOSIS — B373 Candidiasis of vulva and vagina: Secondary | ICD-10-CM

## 2018-04-13 DIAGNOSIS — L750 Bromhidrosis: Secondary | ICD-10-CM | POA: Diagnosis not present

## 2018-04-13 DIAGNOSIS — R300 Dysuria: Secondary | ICD-10-CM | POA: Diagnosis not present

## 2018-04-13 DIAGNOSIS — R102 Pelvic and perineal pain: Secondary | ICD-10-CM

## 2018-04-13 DIAGNOSIS — B3731 Acute candidiasis of vulva and vagina: Secondary | ICD-10-CM

## 2018-04-13 LAB — POCT URINALYSIS DIP (PROADVANTAGE DEVICE)
BILIRUBIN UA: NEGATIVE
BILIRUBIN UA: NEGATIVE mg/dL
Glucose, UA: NEGATIVE mg/dL
Leukocytes, UA: NEGATIVE
Nitrite, UA: NEGATIVE
PH UA: 6 (ref 5.0–8.0)
Protein Ur, POC: NEGATIVE mg/dL
SPECIFIC GRAVITY, URINE: 1.02
Urobilinogen, Ur: NEGATIVE

## 2018-04-13 LAB — POCT WET PREP (WET MOUNT)
CLUE CELLS WET PREP WHIFF POC: NEGATIVE
TRICHOMONAS WET PREP HPF POC: ABSENT

## 2018-04-13 MED ORDER — TERCONAZOLE 0.4 % VA CREA: 1.0000 | TOPICAL_CREAM | Freq: Every day | VAGINAL | 1 refills | Status: DC

## 2018-04-13 NOTE — Progress Notes (Signed)
Subjective: Chief Complaint  Patient presents with  . vaginal pain    vaginia itching, pain X 3 weeks   Here for symptoms.   For last 3 weeks, having some discomfort with urination, burning sensation in lower abdomen.  Feels itching on inside of vagina.   No discharge.   No rash.  Has urinary frequency.  No blood in urine.   Maybe odor with urine.   Urine is cloudy.  No fever, no NVD.  No diarrhea.  Went to pulmonology doctor recently, had lots of cough, was given zpak.   Started getting some itching, used a diflucan she had left over.  Hasn't had any recent yeast yeast infection.   Has had BV years ago.   Married.  No concern for STD.  Lately hasn't had sex.  Past Medical History:  Diagnosis Date  . Anemia    on iron  . Back pain    tx with ibuprofen 800mg   . Bronchitis    treated recently by abx  . Cough    non-productive  . DUB (dysfunctional uterine bleeding)   . Fibroid uterus    GYN--Dr. Rivard  . GERD (gastroesophageal reflux disease)   . Hypertension   . Infertility, female   . Polychondritis   . Recurrent upper respiratory infection (URI)    in October - tx with abx  . Type II or unspecified type diabetes mellitus without mention of complication, not stated as uncontrolled    diagnosed by Dr. Berdine Addison   Current Outpatient Medications on File Prior to Visit  Medication Sig Dispense Refill  . amLODipine (NORVASC) 5 MG tablet Take 1 tablet (5 mg total) by mouth daily. 90 tablet 1  . atorvastatin (LIPITOR) 20 MG tablet TAKE 1 TABLET BY MOUTH EVERY DAY 90 tablet 0  . Cholecalciferol (VITAMIN D) 2000 units tablet Take 1 tablet (2,000 Units total) by mouth daily. 1000 tablet 3  . Dulaglutide (TRULICITY) 6.78 LF/8.1OF SOPN Inject 0.75 mg into the skin daily.    . folic acid (FOLVITE) 1 MG tablet Take 1 mg by mouth.    . hydrochlorothiazide (HYDRODIURIL) 25 MG tablet Take 25 mg by mouth daily.  3  . KLOR-CON M20 20 MEQ tablet TAKE 1 TABLET BY MOUTH EVERY DAY 90 tablet 0  .  metFORMIN (GLUCOPHAGE) 1000 MG tablet TAKE 1 TABLET TWICE A DAY WITH A MEAL 180 tablet 1  . methotrexate 2.5 MG tablet Take 2.5 mg by mouth 2 (two) times a week.    . predniSONE (DELTASONE) 20 MG tablet Take 5 mg by mouth daily with breakfast.      No current facility-administered medications on file prior to visit.    ROS as in subjective   Objective: BP 110/70   Pulse 86   Temp 98.3 F (36.8 C) (Oral)   Resp 16   Ht 5\' 4"  (1.626 m)   Wt 189 lb 12.8 oz (86.1 kg)   LMP 10/09/2015   SpO2 98%   BMI 32.58 kg/m   Gen: wd, wn, nad, AA female Abdomen: mild suprapubic tenderness, otherwise nontender, no mass, no organomegaly Gyn: Normal external genitalia without lesions, vagina with normal mucosa, cervix without lesions, no cervical motion tenderness, +mild white abnormal vaginal discharge.  Uterus and adnexa not enlarged, nontender, no masses.   Exam chaperoned by nurse. Rectal: anus normal appearing     Assessment: Encounter Diagnoses  Name Primary?  . Vaginal pain Yes  . Dysuria   . Urinary body odor   .  Yeast vaginitis      Plan: Begin medication below x 1 week, prefers cream over oral tablet diflucan.  Avoid sugars food/drinks, hydrate well, and f/u pending urine culture.    Korynne was seen today for vaginal pain.  Diagnoses and all orders for this visit:  Vaginal pain -     POCT Urinalysis DIP (Proadvantage Device) -     POCT Wet Prep [facility])  Dysuria -     Urine Culture -     POCT Wet Prep [facility])  Urinary body odor -     Urine Culture  Yeast vaginitis  Other orders -     terconazole (TERAZOL 7) 0.4 % vaginal cream; Place 1 applicator vaginally at bedtime.

## 2018-04-14 DIAGNOSIS — I10 Essential (primary) hypertension: Secondary | ICD-10-CM | POA: Diagnosis not present

## 2018-04-14 DIAGNOSIS — E1165 Type 2 diabetes mellitus with hyperglycemia: Secondary | ICD-10-CM | POA: Diagnosis not present

## 2018-04-14 DIAGNOSIS — E7800 Pure hypercholesterolemia, unspecified: Secondary | ICD-10-CM | POA: Diagnosis not present

## 2018-04-14 DIAGNOSIS — Z7952 Long term (current) use of systemic steroids: Secondary | ICD-10-CM | POA: Diagnosis not present

## 2018-04-14 LAB — HEMOGLOBIN A1C: HEMOGLOBIN A1C: 8.6

## 2018-04-17 ENCOUNTER — Other Ambulatory Visit: Payer: Self-pay | Admitting: Medical

## 2018-04-17 LAB — URINE CULTURE

## 2018-04-17 MED ORDER — CIPROFLOXACIN HCL 500 MG PO TABS: 500.0000 mg | ORAL_TABLET | Freq: Two times a day (BID) | ORAL | 0 refills | Status: AC

## 2018-05-17 ENCOUNTER — Ambulatory Visit: Payer: BLUE CROSS/BLUE SHIELD | Admitting: Medical

## 2018-05-17 ENCOUNTER — Encounter: Payer: Self-pay | Admitting: Medical

## 2018-05-17 VITALS — BP 120/70 | HR 80 | Temp 98.7°F | Resp 16 | Ht 64.0 in | Wt 187.2 lb

## 2018-05-17 DIAGNOSIS — G959 Disease of spinal cord, unspecified: Secondary | ICD-10-CM | POA: Diagnosis not present

## 2018-05-17 DIAGNOSIS — M549 Dorsalgia, unspecified: Secondary | ICD-10-CM

## 2018-05-17 DIAGNOSIS — M5416 Radiculopathy, lumbar region: Secondary | ICD-10-CM | POA: Diagnosis not present

## 2018-05-17 DIAGNOSIS — M79652 Pain in left thigh: Secondary | ICD-10-CM | POA: Diagnosis not present

## 2018-05-17 LAB — POCT URINALYSIS DIP (PROADVANTAGE DEVICE)
BILIRUBIN UA: NEGATIVE
Glucose, UA: NEGATIVE mg/dL
Ketones, POC UA: NEGATIVE mg/dL
Leukocytes, UA: NEGATIVE
NITRITE UA: NEGATIVE
PH UA: 6 (ref 5.0–8.0)
Protein Ur, POC: NEGATIVE mg/dL
Specific Gravity, Urine: 1.03
UUROB: NEGATIVE

## 2018-05-17 MED ORDER — ALPRAZOLAM 0.5 MG PO TABS: ORAL_TABLET | ORAL | 0 refills | Status: DC

## 2018-05-17 MED ORDER — ACETAMINOPHEN 500 MG PO TABS: 500.0000 mg | ORAL_TABLET | Freq: Four times a day (QID) | ORAL | 0 refills | Status: DC | PRN

## 2018-05-17 NOTE — Progress Notes (Signed)
Subjective: Chief Complaint  Patient presents with  . left side pain    left side pain leg, back abdomin    Here for pain in left leg and left side.  Having pains in left leg inside and outside of upper leg.  No foot pain, but sometimes gets left calve pain.   Has low back pain as well.  She says the left leg and back always hurts x 3 weeks.   No numb or tingling.  Sometimes leg feels like it can give out.   Hurts even lying down in bed.  Denies trauma, injury, fever, no new bowel or bladder changes, no saddle anesthesia.  No other aggravating or relieving factors. No other complaint.   Objective:  BP 120/70   Pulse 80   Temp 98.7 F (37.1 C) (Oral)   Resp 16   Ht 5\' 4"  (1.626 m)   Wt 187 lb 3.2 oz (84.9 kg)   LMP 10/09/2015   SpO2 97%   BMI 32.13 kg/m   General: Well-developed well-nourished no acute distress, AA female Skin: unremarkable Tender left lumbar region but normal ROM, no deformity Mild pain noted with left him ROM which is full though Tender in left thigh in general, but no palpable cord, no swelling, no asymmetry otherwise leg nontender and normal ROM without deformity or swelling Left leg with slightly decreased DTR compared to right, but otherwise legs neurovascularly intact    Assessment: Encounter Diagnoses  Name Primary?  . Lumbar radiculopathy Yes  . Back pain, unspecified back location, unspecified back pain laterality, unspecified chronicity   . Disease of spinal cord ([place])   . Pain of left thigh     Plan: I reviewed her April 2019 hip x-rays which were normal, reviewed the abdomen pelvis CT from 2017 that showed mild degenerative changes of the lumbar spine.  We discussed her symptoms and concerns that suggest some mild lumbar radicular issue.  Gave options for therapy or further evaluation.  She does not want to pursue chiropractic or physical therapy at this time although I think it could be helpful  She wants to get to the bottom of the cause.    I will refer for MRI lumbar spine.  Xanax short-term for sedation for the procedure.  She can use Tylenol in the meantime, continue regular stretching routine as discussed  Adelise was seen today for left side pain.  Diagnoses and all orders for this visit:  Lumbar radiculopathy  Back pain, unspecified back location, unspecified back pain laterality, unspecified chronicity -     POCT Urinalysis DIP (Proadvantage Device)  Disease of spinal cord (HCC) -     MR Lumbar Spine Wo Contrast; Future  Pain of left thigh  Other orders -     acetaminophen (TYLENOL) 500 MG tablet; Take 1 tablet (500 mg total) by mouth every 6 (six) hours as needed. -     ALPRAZolam (XANAX) 0.5 MG tablet; 1 tablet before procedure

## 2018-05-19 ENCOUNTER — Encounter: Payer: Self-pay | Admitting: Family Medicine

## 2018-05-23 ENCOUNTER — Ambulatory Visit: Payer: BLUE CROSS/BLUE SHIELD | Admitting: Sports Medicine

## 2018-05-26 ENCOUNTER — Telehealth: Payer: Self-pay

## 2018-05-26 NOTE — Telephone Encounter (Signed)
Spoke to pt about MRI reason. Pt was advised she is having MRI of spine due to Back may be causing leg pain. Michelle Mack

## 2018-05-30 ENCOUNTER — Encounter (HOSPITAL_COMMUNITY): Payer: Self-pay

## 2018-05-30 ENCOUNTER — Ambulatory Visit (HOSPITAL_COMMUNITY)
Admission: EM | Admit: 2018-05-30 | Discharge: 2018-05-30 | Disposition: A | Discharge status: Home / Self Care | Payer: BLUE CROSS/BLUE SHIELD | Attending: Family Medicine | Admitting: Family Medicine

## 2018-05-30 DIAGNOSIS — D631 Anemia in chronic kidney disease: Secondary | ICD-10-CM | POA: Insufficient documentation

## 2018-05-30 DIAGNOSIS — E559 Vitamin D deficiency, unspecified: Secondary | ICD-10-CM | POA: Diagnosis not present

## 2018-05-30 DIAGNOSIS — M941 Relapsing polychondritis: Secondary | ICD-10-CM | POA: Diagnosis not present

## 2018-05-30 DIAGNOSIS — K219 Gastro-esophageal reflux disease without esophagitis: Secondary | ICD-10-CM | POA: Insufficient documentation

## 2018-05-30 DIAGNOSIS — G4733 Obstructive sleep apnea (adult) (pediatric): Secondary | ICD-10-CM | POA: Diagnosis not present

## 2018-05-30 DIAGNOSIS — I129 Hypertensive chronic kidney disease with stage 1 through stage 4 chronic kidney disease, or unspecified chronic kidney disease: Secondary | ICD-10-CM | POA: Diagnosis not present

## 2018-05-30 DIAGNOSIS — E1122 Type 2 diabetes mellitus with diabetic chronic kidney disease: Secondary | ICD-10-CM | POA: Diagnosis not present

## 2018-05-30 DIAGNOSIS — Z8249 Family history of ischemic heart disease and other diseases of the circulatory system: Secondary | ICD-10-CM | POA: Diagnosis not present

## 2018-05-30 DIAGNOSIS — Z881 Allergy status to other antibiotic agents status: Secondary | ICD-10-CM | POA: Diagnosis not present

## 2018-05-30 DIAGNOSIS — R1084 Generalized abdominal pain: Secondary | ICD-10-CM | POA: Insufficient documentation

## 2018-05-30 DIAGNOSIS — Z833 Family history of diabetes mellitus: Secondary | ICD-10-CM | POA: Diagnosis not present

## 2018-05-30 DIAGNOSIS — M79605 Pain in left leg: Secondary | ICD-10-CM | POA: Insufficient documentation

## 2018-05-30 DIAGNOSIS — R1012 Left upper quadrant pain: Secondary | ICD-10-CM | POA: Diagnosis not present

## 2018-05-30 DIAGNOSIS — Z79899 Other long term (current) drug therapy: Secondary | ICD-10-CM | POA: Diagnosis not present

## 2018-05-30 DIAGNOSIS — Z7984 Long term (current) use of oral hypoglycemic drugs: Secondary | ICD-10-CM | POA: Diagnosis not present

## 2018-05-30 DIAGNOSIS — Z6830 Body mass index (BMI) 30.0-30.9, adult: Secondary | ICD-10-CM | POA: Insufficient documentation

## 2018-05-30 DIAGNOSIS — Z7952 Long term (current) use of systemic steroids: Secondary | ICD-10-CM | POA: Insufficient documentation

## 2018-05-30 DIAGNOSIS — Z888 Allergy status to other drugs, medicaments and biological substances status: Secondary | ICD-10-CM | POA: Diagnosis not present

## 2018-05-30 DIAGNOSIS — N182 Chronic kidney disease, stage 2 (mild): Secondary | ICD-10-CM | POA: Insufficient documentation

## 2018-05-30 DIAGNOSIS — Z880 Allergy status to penicillin: Secondary | ICD-10-CM | POA: Diagnosis not present

## 2018-05-30 DIAGNOSIS — E669 Obesity, unspecified: Secondary | ICD-10-CM | POA: Diagnosis not present

## 2018-05-30 LAB — HEPATIC FUNCTION PANEL
ALBUMIN: 4.2 g/dL (ref 3.5–5.0)
ALT: 22 U/L (ref 0–44)
AST: 20 U/L (ref 15–41)
Alkaline Phosphatase: 88 U/L (ref 38–126)
BILIRUBIN DIRECT: 0.2 mg/dL (ref 0.0–0.2)
BILIRUBIN INDIRECT: 0.7 mg/dL (ref 0.3–0.9)
BILIRUBIN TOTAL: 0.9 mg/dL (ref 0.3–1.2)
Total Protein: 7.8 g/dL (ref 6.5–8.1)

## 2018-05-30 LAB — CBC
HEMATOCRIT: 39.8 % (ref 36.0–46.0)
HEMOGLOBIN: 12.5 g/dL (ref 12.0–15.0)
MCH: 28.3 pg (ref 26.0–34.0)
MCHC: 31.4 g/dL (ref 30.0–36.0)
MCV: 90 fL (ref 80.0–100.0)
Platelets: 337 10*3/uL (ref 150–400)
RBC: 4.42 MIL/uL (ref 3.87–5.11)
RDW: 14 % (ref 11.5–15.5)
WBC: 7.6 10*3/uL (ref 4.0–10.5)
nRBC: 0 % (ref 0.0–0.2)

## 2018-05-30 LAB — POCT I-STAT, CHEM 8
BUN: 11 mg/dL (ref 6–20)
Calcium, Ion: 1.23 mmol/L (ref 1.15–1.40)
Chloride: 102 mmol/L (ref 98–111)
Creatinine, Ser: 0.9 mg/dL (ref 0.44–1.00)
Glucose, Bld: 100 mg/dL — ABNORMAL HIGH (ref 70–99)
HEMATOCRIT: 40 % (ref 36.0–46.0)
HEMOGLOBIN: 13.6 g/dL (ref 12.0–15.0)
Potassium: 3.4 mmol/L — ABNORMAL LOW (ref 3.5–5.1)
SODIUM: 142 mmol/L (ref 135–145)
TCO2: 29 mmol/L (ref 22–32)

## 2018-05-30 LAB — POCT URINALYSIS DIP (DEVICE)
Bilirubin Urine: NEGATIVE
GLUCOSE, UA: NEGATIVE mg/dL
KETONES UR: NEGATIVE mg/dL
Leukocytes, UA: NEGATIVE
NITRITE: NEGATIVE
PROTEIN: NEGATIVE mg/dL
Specific Gravity, Urine: 1.025 (ref 1.005–1.030)
UROBILINOGEN UA: 0.2 mg/dL (ref 0.0–1.0)
pH: 5.5 (ref 5.0–8.0)

## 2018-05-30 LAB — LIPASE, BLOOD: LIPASE: 49 U/L (ref 11–51)

## 2018-05-30 MED ORDER — CYCLOBENZAPRINE HCL 10 MG PO TABS: 10.0000 mg | ORAL_TABLET | Freq: Two times a day (BID) | ORAL | 0 refills | Status: DC | PRN

## 2018-05-30 MED ORDER — NAPROXEN 500 MG PO TABS: 500.0000 mg | ORAL_TABLET | Freq: Two times a day (BID) | ORAL | 0 refills | Status: DC

## 2018-05-30 NOTE — ED Triage Notes (Signed)
Pt present abdominal pain in her LUQ, pain is shooting down to her left leg.  Pt started on Saturday

## 2018-05-30 NOTE — Discharge Instructions (Addendum)
The blood work we obtained was normal, your urine was also normal, did not show any signs of infection  For your leg pain continue 5 mg prednisone daily, add in Naprosyn twice daily with food or may try ibuprofen 600 mg plus Tylenol as alternative.  You may use flexeril as needed to help with pain. This is a muscle relaxer and causes sedation- please use only at bedtime or when you will be home and not have to drive/work-take half tablet if causing too much drowsiness  Please monitor symptoms, follow-up if symptoms not resolving, worsening, developing weakness in legs, loss of control of bowel or bladder, worsening numbness or tingling

## 2018-05-30 NOTE — ED Provider Notes (Signed)
[facility]    CSN: [id number] Arrival date & time: 05/30/18  1738     History   Chief Complaint Chief Complaint  Patient presents with  . Abdominal Pain    HPI Michelle Mack is a 52 y.o. female history of DM type II, polychondritis, hypertension, presenting today for evaluation of left side pain and left leg pain.  Patient states that over the past week she has had worse leg.  She has had associated numbness and tingling with lying flat and pain that radiates down her leg with sitting upright.  She has had some mild discomfort in her lower back associated with this, but no true pain in the back.  Denies history of sciatica.  She has also had discomfort in her left upper abdomen.  Is unsure if these pains are connected or separate.  She denies associated nausea or vomiting.  She denies diarrhea.  Patient states that she struggles with constipation regularly.  Occasionally will go 1 week without having bowel movements.  Frequently hard and requires straining.  She is tried MiraLAX previously which resulted in development of rash.  Her main concern is her left leg pain as it is been interfering with sleep and cannot get comfortable.  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  Patient takes prednisone daily 5 mg for polychondritis, her providers have been attempting to wean her off of this for a while.  Denies changes in urination or bowel movements.  Denies saddle anesthesia.  Denies any injury or trauma to leg.  HPI  Past Medical History:  Diagnosis Date  . Anemia    on iron  . Back pain    tx with ibuprofen 800mg   . Bronchitis    treated recently by abx  . Cough    non-productive  . DUB (dysfunctional uterine bleeding)   . Fibroid uterus    GYN--Dr. Rivard  . GERD (gastroesophageal reflux disease)   . Hypertension   . Infertility, female   . Polychondritis   . Recurrent upper respiratory infection (URI)    in October - tx with abx    . Type II or unspecified type diabetes mellitus without mention of complication, not stated as uncontrolled    diagnosed by Dr. Berdine Addison    Patient Active Problem List   Diagnosis Date Noted  . 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 Fibroid, uterine 11/13/2015  . Mediastinal adenopathy 10/23/2015  . Collapse of left lung 10/23/2015  . Relapsing polychondritis 10/23/2015  . Type 2 diabetes mellitus, controlled, with renal complications ([place]) 58/52/7782  . CKD (chronic kidney disease) stage 2, GFR 60-89 ml/min 11/06/2014  . Type 2 diabetes mellitus without complication ([place]) 42/35/3614  . Essential hypertension 05/22/2014  . Chronic polychondritis 05/02/2014  . Dysfunctional uterine bleeding 04/29/2014  . Obstructive apnea 04/29/2014  . Other specified respiratory disorders 04/29/2014  . Microscopic hematuria 04/12/2014  . 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 Positive ANA (antinuclear antibody) 02/26/2014  . Vitamin D deficiency 07/31/2013  . Obesity (BMI 30-39.9)   . Impaired fasting glucose 05/24/2012  . Fibroid uterus 05/24/2012  . Infertility, female   . DUB (dysfunctional uterine bleeding)   . Chronic cough 05/25/2011  . GERD (gastroesophageal reflux disease) 05/25/2011  . Anemia 05/25/2011  . Essential hypertension, benign 04/05/2011    Past Surgical History:  Procedure Laterality Date  . BRONCHOSCOPY  01/2014   at [facility] expiratory collapse of trachea and focal narrowing in L mainstem bronchus and  LUL  . ENDOMETRIAL VAPORIZATION W/ VERSAPOINT    . MYOMECTOMY  2002    OB History    Gravida  5   Para  0   Term  0   Preterm      AB  4   Living  0     SAB  2   TAB  2   Ectopic      Multiple      Live Births               Home Medications    Prior to Admission medications   Medication Sig Start Date End Date Taking? Authorizing Provider  acetaminophen (TYLENOL) 500 MG tablet Take 1 tablet (500 mg total) by mouth every 6 (six) hours as needed. 05/17/18    Tysinger, Camelia Eng, PA-C  amLODipine (NORVASC) 5 MG tablet Take 1 tablet (5 mg total) by mouth daily. 03/16/18   [name], MD  atorvastatin (LIPITOR) 20 MG tablet TAKE 1 TABLET BY MOUTH EVERY DAY 03/07/18   [name], MD  Cholecalciferol (VITAMIN D) 2000 units tablet Take 1 tablet (2,000 Units total) by mouth daily. 03/16/18   [name], MD  cyclobenzaprine (FLEXERIL) 10 MG tablet Take 1 tablet (10 mg total) by mouth 2 (two) times daily as needed for muscle spasms. 05/30/18   ,  C, PA-C  Dulaglutide (TRULICITY) 9.38 HW/2.9HB SOPN Inject 0.75 mg into the skin daily.    [provider]  folic acid (FOLVITE) 1 MG tablet Take 1 mg by mouth. 02/16/17   [provider]  hydrochlorothiazide (HYDRODIURIL) 25 MG tablet Take 25 mg by mouth daily. 08/01/15   [provider]  KLOR-CON M20 20 MEQ tablet TAKE 1 TABLET BY MOUTH EVERY DAY 02/28/18   [name], MD  metFORMIN (GLUCOPHAGE) 1000 MG tablet TAKE 1 TABLET TWICE A DAY WITH A MEAL 04/29/17   [name], MD  methotrexate 2.5 MG tablet Take 2.5 mg by mouth 2 (two) times a week.    [provider]  naproxen (NAPROSYN) 500 MG tablet Take 1 tablet (500 mg total) by mouth 2 (two) times daily. 05/30/18   ,  C, PA-C  predniSONE (DELTASONE) 20 MG tablet Take 5 mg by mouth daily with breakfast.     [provider]    Family History Family History  Problem Relation Age of Onset  . Hypertension Mother   . Cancer Mother        pancreatic cancer  . Diabetes Mother   . Hypertension Father   . Diabetes Father   . Cancer Father        prostate cancer  . Hypertension Sister   . Thyroid nodules Sister   . Diabetes Maternal Grandmother   . Breast cancer Maternal Grandmother   . Heart disease Maternal Grandmother   . Heart disease Paternal Grandmother     Social History Social History   Tobacco Use  . Smoking status: Never Smoker  . Smokeless tobacco: Never Used  Substance Use Topics   . Alcohol use: Yes    Comment: 0-1 drink/week  . Drug use: No     Allergies   Ampicillin; Penicillins; Cephalosporins; Nitrofuran derivatives; Sulfamethoxazole-trimethoprim; Sulfur; and Mobic [meloxicam]   Review of Systems Review of Systems  Constitutional: Negative for activity change, chills, diaphoresis and fatigue.  HENT: Negative for ear pain, tinnitus and trouble swallowing.   Eyes: Negative for photophobia and visual disturbance.  Respiratory: Negative for cough, chest tightness and shortness of  breath.   Cardiovascular: Negative for chest pain and leg swelling.  Gastrointestinal: Positive for abdominal pain. Negative for blood in stool, nausea and vomiting.  Musculoskeletal: Positive for arthralgias, back pain, gait problem and myalgias. Negative for neck pain and neck stiffness.  Skin: Negative for color change and wound.  Neurological: Positive for numbness. Negative for dizziness, weakness, light-headedness and headaches.     Physical Exam Triage Vital Signs ED Triage Vitals [05/30/18 1837]  Enc Vitals Group     BP 126/75     Pulse Rate 91     Resp 16     Temp 98.7 F (37.1 C)     Temp Source Oral     SpO2 100 %     Weight      Height      Head Circumference      Peak Flow      Pain Score 8     Pain Loc      Pain Edu?      Excl. in [place]?    No data found.  Updated Vital Signs BP 126/75 (BP Location: Right Arm)   Pulse 91   Temp 98.7 F (37.1 C) (Oral)   Resp 16   LMP 10/09/2015   SpO2 100%   Visual Acuity Right Eye Distance:   Left Eye Distance:   Bilateral Distance:    Right Eye Near:   Left Eye Near:    Bilateral Near:     Physical Exam  Constitutional: She is oriented to person, place, and time. She appears well-developed and well-nourished. No distress.  No acute distress  HENT:  Head: Normocephalic and atraumatic.  Nose: Nose normal.  Eyes: Conjunctivae are normal.  Neck: Neck supple.  Cardiovascular: Normal rate and regular  rhythm.  No murmur heard. Pulmonary/Chest: Effort normal and breath sounds normal. No respiratory distress.  Breathing comfortably at rest, CTABL, no wheezing, rales or other adventitious sounds auscultated  Abdominal: Soft. She exhibits no distension. There is no tenderness.  Mild tenderness to left upper abdomen as well as bilateral lower quadrants, no focal tenderness, negative rebound, negative McBurney's, negative Rovsing.  Musculoskeletal: Normal range of motion. She exhibits no edema.  Mild tenderness to palpation of lumbar spine throughout midline as well as left lateral lumbar musculature extending to lateral hip anteriorly into groin and anterior thigh Strength intact and equal to left   Neurological: She is alert and oriented to person, place, and time.  Patient A&O x3, cranial nerves II-XII grossly intact, strength at shoulders, hips and knees 5/5, equal bilaterally, patellar reflex and achilles reflex 2 + bilaterally.Gait with antalgia  Skin: Skin is warm and dry.  Psychiatric: She has a normal mood and affect.  Nursing note and vitals reviewed.    UC Treatments / Results  Labs (all labs ordered are listed, but only abnormal results are displayed) Labs Reviewed  POCT URINALYSIS DIP (DEVICE) - Abnormal; Notable for the following components:      Result Value   Hgb urine dipstick MODERATE (*)    All other components within normal limits  POCT I-STAT, CHEM 8 - Abnormal; Notable for the following components:   Potassium 3.4 (*)    Glucose, Bld 100 (*)    All other components within normal limits  CBC  HEPATIC FUNCTION PANEL  LIPASE, BLOOD    EKG None  Radiology No results found.  Procedures Procedures (including critical care time)  Medications Ordered in UC Medications - No data to display  Initial  Impression / Assessment and Plan / UC Course  I have reviewed the triage vital signs and the nursing notes.  Pertinent labs & imaging results that were available  during my care of the patient were reviewed by me and considered in my medical decision making (see chart for details).    Leg pain concerning for possible underlying sciatica despite no prominent back pain.  No injury.  Given distribution of symptoms will treat as such with anti-inflammatories and muscle relaxers.  Patient already on prednisone, discussed increasing this dose versus anti-inflammatories, opted for anti-inflammatories.  Will provide Naprosyn to take twice daily for this as well as muscle relaxers.  Discussed drowsiness regarding muscle relaxers advised only use at home or bedtime.  Do not drive or work while taking.  Continue to monitor symptoms, follow-up if symptoms not resolving. UA negative for signs of infection, moderate blood, patient states this is normal.  Electrolytes normal, potassium slightly below lower limit of normal, recommended increasing dietary intake of this.  Patient requested blood work for abdomen, drew CBC, lipase and LFTs to further evaluate this.  Abdominal exam relatively unremarkable, negative peritoneal signs.  Will continue to monitor pain and follow-up if developing new symptoms or pain worsening. Discussed strict return precautions. Patient verbalized understanding and is agreeable with plan.  Final Clinical Impressions(s) / UC Diagnoses   Final diagnoses:  Left leg pain  Generalized abdominal pain     Discharge Instructions     The blood work we obtained was normal, your urine was also normal, did not show any signs of infection  For your leg pain continue 5 mg prednisone daily, add in Naprosyn twice daily with food or may try ibuprofen 600 mg plus Tylenol as alternative.  You may use flexeril as needed to help with pain. This is a muscle relaxer and causes sedation- please use only at bedtime or when you will be home and not have to drive/work-take half tablet if causing too much drowsiness  Please monitor symptoms, follow-up if symptoms not  resolving, worsening, developing weakness in legs, loss of control of bowel or bladder, worsening numbness or tingling   ED Prescriptions    Medication Sig Dispense Auth. Provider   naproxen (NAPROSYN) 500 MG tablet Take 1 tablet (500 mg total) by mouth 2 (two) times daily. 30 tablet ,  C, PA-C   cyclobenzaprine (FLEXERIL) 10 MG tablet Take 1 tablet (10 mg total) by mouth 2 (two) times daily as needed for muscle spasms. 20 tablet , [place] C, PA-C     Controlled Substance Prescriptions [place] Controlled Substance Registry consulted? Not Applicable   Janith Lima, [place] 05/30/18 2110

## 2018-06-03 ENCOUNTER — Other Ambulatory Visit: Payer: Self-pay | Admitting: Family Medicine

## 2018-06-03 DIAGNOSIS — E7800 Pure hypercholesterolemia, unspecified: Secondary | ICD-10-CM

## 2018-07-01 ENCOUNTER — Ambulatory Visit
Admission: RE | Admit: 2018-07-01 | Discharge: 2018-07-01 | Disposition: A | Discharge status: Home / Self Care | Payer: BLUE CROSS/BLUE SHIELD | Source: Ambulatory Visit | Attending: Medical | Admitting: Medical

## 2018-07-01 DIAGNOSIS — M4186 Other forms of scoliosis, lumbar region: Secondary | ICD-10-CM | POA: Diagnosis not present

## 2018-07-01 DIAGNOSIS — G959 Disease of spinal cord, unspecified: Secondary | ICD-10-CM

## 2018-07-04 ENCOUNTER — Other Ambulatory Visit: Payer: Self-pay

## 2018-07-04 DIAGNOSIS — M549 Dorsalgia, unspecified: Secondary | ICD-10-CM

## 2018-07-04 DIAGNOSIS — M5416 Radiculopathy, lumbar region: Secondary | ICD-10-CM

## 2018-07-04 DIAGNOSIS — M79652 Pain in left thigh: Secondary | ICD-10-CM

## 2018-07-10 ENCOUNTER — Other Ambulatory Visit: Payer: Self-pay | Admitting: Family Medicine

## 2018-07-10 DIAGNOSIS — N898 Other specified noninflammatory disorders of vagina: Secondary | ICD-10-CM | POA: Diagnosis not present

## 2018-07-17 ENCOUNTER — Emergency Department (HOSPITAL_COMMUNITY)
Admission: EM | Admit: 2018-07-17 | Discharge: 2018-07-18 | Disposition: A | Discharge status: Home / Self Care | Payer: BLUE CROSS/BLUE SHIELD | Attending: Emergency Medicine | Admitting: Emergency Medicine

## 2018-07-17 DIAGNOSIS — Z5321 Procedure and treatment not carried out due to patient leaving prior to being seen by health care provider: Secondary | ICD-10-CM | POA: Diagnosis not present

## 2018-07-17 DIAGNOSIS — M25512 Pain in left shoulder: Secondary | ICD-10-CM | POA: Insufficient documentation

## 2018-07-18 ENCOUNTER — Inpatient Hospital Stay: Admission: AD | Admit: 2018-07-18 | Payer: Self-pay | Source: Ambulatory Visit | Admitting: Cardiology

## 2018-07-18 ENCOUNTER — Encounter (HOSPITAL_COMMUNITY): Payer: Self-pay | Admitting: Emergency Medicine

## 2018-07-18 ENCOUNTER — Other Ambulatory Visit: Payer: Self-pay

## 2018-07-18 ENCOUNTER — Telehealth: Payer: Self-pay

## 2018-07-18 ENCOUNTER — Ambulatory Visit: Payer: BLUE CROSS/BLUE SHIELD | Admitting: Medical

## 2018-07-18 ENCOUNTER — Encounter: Payer: Self-pay | Admitting: Medical

## 2018-07-18 VITALS — BP 112/70 | HR 80 | Temp 98.2°F | Resp 16 | Ht 64.0 in | Wt 185.0 lb

## 2018-07-18 DIAGNOSIS — M948X9 Other specified disorders of cartilage, unspecified sites: Secondary | ICD-10-CM | POA: Diagnosis not present

## 2018-07-18 DIAGNOSIS — E1165 Type 2 diabetes mellitus with hyperglycemia: Secondary | ICD-10-CM | POA: Diagnosis not present

## 2018-07-18 DIAGNOSIS — I10 Essential (primary) hypertension: Secondary | ICD-10-CM

## 2018-07-18 DIAGNOSIS — R079 Chest pain, unspecified: Secondary | ICD-10-CM | POA: Diagnosis not present

## 2018-07-18 DIAGNOSIS — R0789 Other chest pain: Secondary | ICD-10-CM | POA: Diagnosis present

## 2018-07-18 DIAGNOSIS — Z0189 Encounter for other specified special examinations: Secondary | ICD-10-CM | POA: Diagnosis not present

## 2018-07-18 DIAGNOSIS — G4733 Obstructive sleep apnea (adult) (pediatric): Secondary | ICD-10-CM

## 2018-07-18 DIAGNOSIS — E7800 Pure hypercholesterolemia, unspecified: Secondary | ICD-10-CM

## 2018-07-18 DIAGNOSIS — I209 Angina pectoris, unspecified: Secondary | ICD-10-CM | POA: Diagnosis not present

## 2018-07-18 NOTE — Progress Notes (Signed)
Subjective: Chief Complaint  Patient presents with  . chest pain    chest pain, left arm numbness, sharp pain radiating X 1 day   Medical team: Dr. [name], PCP Dr. Lorne Skeens, endocrinology  Here for chest pain.  Pain started yesterday morning while getting ready to walk out the door to work.   Pain yesterday was in mid to left chest with pain into left arm, sharp.   Pain yesterday lasted all day intermittent for seconds.   With the pains would get sweaty.   No associated SOB, nausea, numbness.  Had some tingling in left hand yesterday that was brief.   Had 1 sharp pain in left arm today so far without chest pain.  No neck pain, no abdominal pain, no back pain.   No current sweats, SOB, no current chest pain, no edema.    No recent strenuous activity.  In general doesn't exercise.   Nonsmoker.  No recent alcohol.    No prior similar pain.  Has history of polychondritis, sees pulmonology for this Dr. Marinda Elk, and doesn't think this has been an issue of late.  On medication for this.     No recent GERD or heartburn.  No other aggravating or relieving factors. No other complaint.    Past Medical History:  Diagnosis Date  . Anemia    on iron  . Back pain    tx with ibuprofen 800mg   . Bronchitis    treated recently by abx  . Cough    non-productive  . DUB (dysfunctional uterine bleeding)   . Fibroid uterus    GYN--Dr. Rivard  . GERD (gastroesophageal reflux disease)   . Hypertension   . Infertility, female   . Polychondritis   . Recurrent upper respiratory infection (URI)    in October - tx with abx  . Type II or unspecified type diabetes mellitus without mention of complication, not stated as uncontrolled    diagnosed by Dr. Berdine Addison   Current Outpatient Medications on File Prior to Visit  Medication Sig Dispense Refill  . amLODipine (NORVASC) 5 MG tablet Take 1 tablet (5 mg total) by mouth daily. 90 tablet 1  . atorvastatin (LIPITOR) 20 MG tablet TAKE 1  TABLET BY MOUTH EVERY DAY 90 tablet 1  . Cholecalciferol (VITAMIN D) 2000 units tablet Take 1 tablet (2,000 Units total) by mouth daily. 1000 tablet 3  . Dulaglutide (TRULICITY) 0.16 WF/0.9NA SOPN Inject 0.75 mg into the skin daily.    . folic acid (FOLVITE) 1 MG tablet Take 1 mg by mouth.    . hydrochlorothiazide (HYDRODIURIL) 25 MG tablet Take 25 mg by mouth daily.  3  . KLOR-CON M20 20 MEQ tablet TAKE 1 TABLET BY MOUTH EVERY DAY 90 tablet 1  . metFORMIN (GLUCOPHAGE) 1000 MG tablet TAKE 1 TABLET TWICE A DAY WITH A MEAL 180 tablet 1  . methotrexate 2.5 MG tablet Take 2.5 mg by mouth 2 (two) times a week.    . naproxen (NAPROSYN) 500 MG tablet Take 1 tablet (500 mg total) by mouth 2 (two) times daily. 30 tablet 0  . predniSONE (DELTASONE) 20 MG tablet Take 5 mg by mouth daily with breakfast.     . acetaminophen (TYLENOL) 500 MG tablet Take 1 tablet (500 mg total) by mouth every 6 (six) hours as needed. (Patient not taking: Reported on 07/18/2018) 30 tablet 0  . cyclobenzaprine (FLEXERIL) 10 MG tablet Take 1 tablet (10 mg total) by mouth 2 (two) times daily  as needed for muscle spasms. (Patient not taking: Reported on 07/18/2018) 20 tablet 0   No current facility-administered medications on file prior to visit.    Family History  Problem Relation Age of Onset  . Hypertension Mother   . Cancer Mother        pancreatic cancer  . Diabetes Mother   . Hypertension Father   . Diabetes Father   . Cancer Father        prostate cancer  . Hypertension Sister   . Thyroid nodules Sister   . Diabetes Maternal Grandmother   . Breast cancer Maternal Grandmother   . Heart disease Maternal Grandmother   . Heart disease Paternal Grandmother      ROS as in subjective   Objective: BP 112/70   Pulse 80   Temp 98.2 F (36.8 C) (Oral)   Resp 16   Ht 5\' 4"  (1.626 m)   Wt 185 lb (83.9 kg)   LMP 10/09/2015   SpO2 99%   BMI 31.76 kg/m   BP Readings from Last 3 Encounters:  07/18/18 112/70   07/18/18 108/77  05/30/18 126/75   Wt Readings from Last 3 Encounters:  07/18/18 185 lb (83.9 kg)  05/17/18 187 lb 3.2 oz (84.9 kg)  04/13/18 189 lb 12.8 oz (86.1 kg)   General appearance: alert, no distress, WD/WN,  Neck: supple, no lymphadenopathy, no thyromegaly, no masses, normal ROM Chest: tender over sternum, slightly tender left upper chest Mild tenderness left upper back MSK: mild tenderness left upper arm, but no deformity, no swelling, mild pain with left shoulder ROM in general Heart: RRR, normal S1, S2, no murmurs Lungs: CTA bilaterally, no wheezes, rhonchi, or rales Abdomen: +bs, soft, non tender, non distended, no masses, no hepatomegaly, no splenomegaly Pulses: 2+ symmetric, upper and lower extremities, normal cap refill Ext: no edema  EKG Rate 83 bpm, PR interval 140 ms, QRS duration 78 ms, QTC 455 ms, axis 30 degrees, normal sinus rhythm, there is a Q in 3 that is new compared to 06/2017 EKG   Assessment: Encounter Diagnoses  Name Primary?  . Chest pain, unspecified type Yes  . Polychondritis   . Chest wall pain   . Essential hypertension   . Type 2 diabetes mellitus with hyperglycemia, unspecified whether long term insulin use ([place])   . Hypercholesteremia   . OSA (obstructive sleep apnea)       Plan: We discussed her symptoms and concerns.  She is tender over the chest wall.  She does not have active chest pain right this moment.  We discussed possible differential.  She does have a new Q wave in 3 on EKG today and she does have risk factors for heart disease.  Discussed case with supervising physician as well Dr. Redmond School.  We are able to work her in for appointment with cardiology today at 1:30 PM.  I advised if any worsening chest pain associated with sweats or shortness of breath or intense chest pain to call 911 in the meantime    Mahayla was seen today for chest pain.  Diagnoses and all orders for this visit:  Chest pain, unspecified type -      EKG 12-Lead  Polychondritis  Chest wall pain  Essential hypertension -     EKG 12-Lead  Type 2 diabetes mellitus with hyperglycemia, unspecified whether long term insulin use (HCC) -     EKG 12-Lead  Hypercholesteremia  OSA (obstructive sleep apnea)

## 2018-07-18 NOTE — Telephone Encounter (Signed)
Patient called in with chest pain since yesterday

## 2018-07-18 NOTE — ED Notes (Signed)
Pt to the desk, states she will just go home and be in pain. Advised pt to stay, refused, EDP aware.

## 2018-07-18 NOTE — ED Triage Notes (Signed)
Pt c/o left shoulder pain that started this am on waking. Denies shortness of breath/chest pain.

## 2018-07-18 NOTE — H&P (Addendum)
Michelle Mack is an 53 y.o. female.   Chief Complaint: Chest pain HPI: Michelle Mack  is a 53 y.o. female  With hypertension, hyperlipidemia, polychondritis, type 2 diabetes that is uncontrolled, referred to [place] for stat evaluation for chest pain and abnormal EKG.  Patient reports yesterday morning (07/17/2018) she woke up with left arm pain. She began to get dressed she began having chest pain that radiated from her left arm. Not associated with shortness of breath, nausea or vomiting, or jaw pain. Reports chest pain was intermittent lasting from a few seconds to a few minutes. She continued to go to work where she continued to have chest pain throughout the day intermittently. Pain would be severe enough to make her stop. She did not notice if pain was worse with exertion. Today, she has continued to have left arm pain, but has not had chest pain. She was evaluated by her PCP he was concerned regarding her symptoms and was also noted to have newly noted T-wave inversion in lead 3 that was not noticed on previous EKG in 2018.  Patient reports hypertension and hyperlipidemia have been well-controlled. She does not exercise as she has been frustrated with her health for the last one to 2 years and did not want to continue to exercise. She works as a Glass blower/designer at [employer] and states that she does walk frequently throughout her day. Has not noticed any exertional difficulty with doing this.  No family history of heart disease. No history of tobacco use.  Past Medical History:  Diagnosis Date  . Anemia    on iron  . Back pain    tx with ibuprofen 800mg   . Bronchitis    treated recently by abx  . Cough    non-productive  . DUB (dysfunctional uterine bleeding)   . Fibroid uterus    GYN--Dr. Rivard  . GERD (gastroesophageal reflux disease)   . Hypertension   . Infertility, female   . Polychondritis   . Recurrent upper respiratory infection (URI)    in October -  tx with abx  . Type II or unspecified type diabetes mellitus without mention of complication, not stated as uncontrolled    diagnosed by Dr. Berdine Addison    Past Surgical History:  Procedure Laterality Date  . BRONCHOSCOPY  01/2014   at [facility] expiratory collapse of trachea and focal narrowing in L mainstem bronchus and LUL  . ENDOMETRIAL VAPORIZATION W/ VERSAPOINT    . MYOMECTOMY  2002    Family History  Problem Relation Age of Onset  . Hypertension Mother   . Cancer Mother        pancreatic cancer  . Diabetes Mother   . Hypertension Father   . Diabetes Father   . Cancer Father        prostate cancer  . Hypertension Sister   . Thyroid nodules Sister   . Diabetes Maternal Grandmother   . Breast cancer Maternal Grandmother   . Heart disease Maternal Grandmother   . Heart disease Paternal Grandmother    Social History:  reports that she has never smoked. She has never used smokeless tobacco. She reports current alcohol use. She reports that she does not use drugs. Review of Systems  Constitutional: Negative.   HENT: Negative.   Eyes: Negative.   Respiratory: Positive for shortness of breath. Negative for wheezing.   Cardiovascular: Positive for chest pain. Negative for orthopnea, claudication and leg swelling.  Genitourinary: Negative.   Musculoskeletal:  Positive for joint pain.  Skin: Negative.   Psychiatric/Behavioral: Negative.   All other systems reviewed and are negative.   Last menstrual period 10/09/2015.  07/18/2018 1:41 PM Weight: 185.38 lb Height: 64in Body Surface Area: 1.89 m Body Mass Index: 31.82 kg/m  Pulse: 94 (Regular)  P.OX: 99% (Room air) BP: 108/74 (Sitting, Left Arm, Standard)  Physical Exam  Constitutional: She is oriented to person, place, and time. She appears well-developed and well-nourished. No distress.  Mildly obese  HENT:  Head: Atraumatic.  Eyes: Conjunctivae are normal.  Neck: Neck supple. No JVD present. No thyromegaly  present.  Cardiovascular: Normal rate, regular rhythm, normal heart sounds and intact distal pulses. Exam reveals no gallop.  No murmur heard. Pulmonary/Chest: Effort normal and breath sounds normal.  Abdominal: Soft. Bowel sounds are normal.  Musculoskeletal: Normal range of motion.        General: No edema.  Neurological: She is alert and oriented to person, place, and time.  Skin: Skin is warm and dry.  Psychiatric: She has a normal mood and affect.    Labs: CBC Latest Ref Rng & Units 05/30/2018 05/30/2018 10/04/2017  WBC 4.0 - 10.5 K/uL - 7.6 8.8  Hemoglobin 12.0 - 15.0 g/dL 13.6 12.5 12.6  Hematocrit 36.0 - 46.0 % 40.0 39.8 38.6  Platelets 150 - 400 K/uL - 337 405(H)   CMP Latest Ref Rng & Units 05/30/2018 10/28/2017 10/04/2017  Glucose 70 - 99 mg/dL 100(H) 113(H) 108(H)  BUN 6 - 20 mg/dL 11 15 17   Creatinine 0.44 - 1.00 mg/dL 0.90 1.09(H) 0.96  Sodium 135 - 145 mmol/L 142 144 142  Potassium 3.5 - 5.1 mmol/L 3.4(L) 3.8 3.8  Chloride 98 - 111 mmol/L 102 101 97  CO2 20 - 29 mmol/L - 28 25  Calcium 8.7 - 10.2 mg/dL - 10.0 10.2  Total Protein 6.5 - 8.1 g/dL 7.8 - 7.3  Total Bilirubin 0.3 - 1.2 mg/dL 0.9 - 1.0  Alkaline Phos 38 - 126 U/L 88 - 96  AST 15 - 41 U/L 20 - 16  ALT 0 - 44 U/L 22 - 20   BMP Latest Ref Rng & Units 05/30/2018 10/28/2017 10/04/2017  Glucose 70 - 99 mg/dL 100(H) 113(H) 108(H)  BUN 6 - 20 mg/dL 11 15 17   Creatinine 0.44 - 1.00 mg/dL 0.90 1.09(H) 0.96  BUN/Creat Ratio 9 - 23 - 14 18  Sodium 135 - 145 mmol/L 142 144 142  Potassium 3.5 - 5.1 mmol/L 3.4(L) 3.8 3.8  Chloride 98 - 111 mmol/L 102 101 97  CO2 20 - 29 mmol/L - 28 25  Calcium 8.7 - 10.2 mg/dL - 10.0 10.2    Lipid Panel     Component Value Date/Time   CHOL 107 10/28/2017 0827   TRIG 120 10/28/2017 0827   HDL 49 10/28/2017 0827   CHOLHDL 2.2 10/28/2017 0827   CHOLHDL 2.4 08/23/2016 1027   VLDL 24 08/23/2016 1027   LDLCALC 34 10/28/2017 0827    HEMOGLOBIN A1C Lab Results  Component Value  Date   HGBA1C 8.6 04/14/2018   MPG 163 (H) 08/27/2015     TSH Recent Labs    10/04/17 1009  TSH 1.380    Medications prior to admission: Folic Acid (1MG  Tablet, 1 Oral daily) Active. Atorvastatin Calcium (20MG  Tablet, 1 Oral daily) Active. hydroCHLOROthiazide (25MG  Tablet, 1 Oral daily) Active. amLODIPine Besylate (5MG  Tablet, 1 Oral daily) Active. Methotrexate (2.5MG  Tablet, Oral 7 tablets once a week) Active. metFORMIN HCl (1000MG  Tablet, 1  Oral two times daily) Active. predniSONE (5MG  Tablet, Oral daily) Active. [facility] Packet, 1 Oral daily) Active. Vitamin D3 (2000UNIT Capsule, 1 Oral daily) Active.  Allergies  Allergen Reactions  . Ampicillin Shortness Of Breath  . Penicillins Shortness Of Breath and Swelling    Has patient had a PCN reaction causing immediate rash, facial/tongue/throat swelling, SOB or lightheadedness with hypotension: yes Has patient had a PCN reaction causing severe rash involving mucus membranes or skin necrosis: NO Has patient had a PCN reaction that required hospitalization Yes Has patient had a PCN reaction occurring within the last 10 years: NO If all of the above answers are "NO", then may proceed with Cephalosporin use.   . Cephalosporins Itching  . Nitrofuran Derivatives Itching    Throat, mouth, hands itch  . Sulfamethoxazole-Trimethoprim Other (See Comments)    "body on fire"  . Sulfur Other (See Comments)    Feeling of burning on the inside  Persist ant cough  . Mobic [Meloxicam] Swelling and Rash    Pt states lips also swelled.     No current facility-administered medications for this encounter.   Current Outpatient Medications:  .  acetaminophen (TYLENOL) 500 MG tablet, Take 1 tablet (500 mg total) by mouth every 6 (six) hours as needed. (Patient not taking: Reported on 07/18/2018), Disp: 30 tablet, Rfl: 0 .  amLODipine (NORVASC) 5 MG tablet, Take 1 tablet (5 mg total) by mouth daily., Disp: 90 tablet, Rfl: 1 .   atorvastatin (LIPITOR) 20 MG tablet, TAKE 1 TABLET BY MOUTH EVERY DAY, Disp: 90 tablet, Rfl: 1 .  Cholecalciferol (VITAMIN D) 2000 units tablet, Take 1 tablet (2,000 Units total) by mouth daily., Disp: 1000 tablet, Rfl: 3 .  cyclobenzaprine (FLEXERIL) 10 MG tablet, Take 1 tablet (10 mg total) by mouth 2 (two) times daily as needed for muscle spasms. (Patient not taking: Reported on 07/18/2018), Disp: 20 tablet, Rfl: 0 .  Dulaglutide (TRULICITY) 2.75 TZ/0.0FV SOPN, Inject 0.75 mg into the skin daily., Disp: , Rfl:  .  folic acid (FOLVITE) 1 MG tablet, Take 1 mg by mouth., Disp: , Rfl:  .  hydrochlorothiazide (HYDRODIURIL) 25 MG tablet, Take 25 mg by mouth daily., Disp: , Rfl: 3 .  KLOR-CON M20 20 MEQ tablet, TAKE 1 TABLET BY MOUTH EVERY DAY, Disp: 90 tablet, Rfl: 1 .  metFORMIN (GLUCOPHAGE) 1000 MG tablet, TAKE 1 TABLET TWICE A DAY WITH A MEAL, Disp: 180 tablet, Rfl: 1 .  methotrexate 2.5 MG tablet, Take 2.5 mg by mouth 2 (two) times a week., Disp: , Rfl:  .  naproxen (NAPROSYN) 500 MG tablet, Take 1 tablet (500 mg total) by mouth 2 (two) times daily., Disp: 30 tablet, Rfl: 0 .  predniSONE (DELTASONE) 20 MG tablet, Take 5 mg by mouth daily with breakfast. , Disp: , Rfl:   CARDIAC STUDIES:  EKG 07/18/2018: Normal sinus rhythm at rate of 88 bpm, normal axis, no evidence of ischemia.  Low-voltage complexes.  Compared to the EKG 07/17/2018, nonspecific inferior T wave inversion no longer present.   Assessment/Plan 1.  Atypical chest pain, cannot exclude ACS in view of nonspecific T normality in a patient with significant cardiac vascular risk factors including diabetes mellitus,connective tissue disease,hypertension and hyperlipidemia. 2.  Diabetes mellitus type II uncontrolled without complications with hyperglycemia. 3.  Hypertension 4.  Hyperlipidemia.  I had a very extensive discussion with the patient that she needs to be admitted to the hospital in view of recurrent episodes of chest  discomfort that  are lasting for several minutes.  Patient was seen in the emergency room just Monday before.  She needs cardiac catheterization for definitive diagnosis of coronary disease.  Of over patient does not want to go to the emergency room, there were no beds available for direct admission, hence after extensive discussion, I'll set her up for outpatient cardiac Physician tomorrow morning, we will obtain stat troponin I, CMP and CBC today in the outpatient basis.  Patient advised not to do any heavy exertional activity.  Prescription for metoprolol succinate and nitroglycerin was given to the patient.  She is also given sublingual nitroglycerin.  Patient's husband is present at the bedside and is aware of the risks associated with her decision making.  Patient is trying to make a trip to [facility] on Thursday morning. She was also started on aspirin 81 mg daily.  I have also started her on Plavix 75 mg daily and advised her to double up on her Lipitor dose from 20 mg to 40 mg for now.  Adrian Prows, MD 07/18/2018, 4:21 PM [facility]. [place] Pager: [phone] Office: [phone] If no answer: Cell:  [phone]

## 2018-07-18 NOTE — Telephone Encounter (Signed)
Patient was scheduled an appointment today with Audelia Acton

## 2018-07-19 ENCOUNTER — Other Ambulatory Visit: Payer: Self-pay

## 2018-07-19 ENCOUNTER — Encounter (HOSPITAL_COMMUNITY)
Admission: RE | Disposition: A | Discharge status: Home / Self Care | Payer: Self-pay | Source: Ambulatory Visit | Attending: Cardiology

## 2018-07-19 ENCOUNTER — Encounter (HOSPITAL_COMMUNITY): Payer: Self-pay | Admitting: Cardiology

## 2018-07-19 ENCOUNTER — Ambulatory Visit (HOSPITAL_COMMUNITY)
Admission: RE | Admit: 2018-07-19 | Discharge: 2018-07-19 | Disposition: A | Discharge status: Home / Self Care | Payer: BLUE CROSS/BLUE SHIELD | Source: Ambulatory Visit | Attending: Cardiology | Admitting: Cardiology

## 2018-07-19 DIAGNOSIS — Z6831 Body mass index (BMI) 31.0-31.9, adult: Secondary | ICD-10-CM | POA: Diagnosis not present

## 2018-07-19 DIAGNOSIS — I200 Unstable angina: Secondary | ICD-10-CM | POA: Diagnosis not present

## 2018-07-19 DIAGNOSIS — I10 Essential (primary) hypertension: Secondary | ICD-10-CM | POA: Diagnosis not present

## 2018-07-19 DIAGNOSIS — E785 Hyperlipidemia, unspecified: Secondary | ICD-10-CM | POA: Diagnosis not present

## 2018-07-19 DIAGNOSIS — Z7984 Long term (current) use of oral hypoglycemic drugs: Secondary | ICD-10-CM | POA: Insufficient documentation

## 2018-07-19 DIAGNOSIS — Z79899 Other long term (current) drug therapy: Secondary | ICD-10-CM | POA: Diagnosis not present

## 2018-07-19 DIAGNOSIS — E669 Obesity, unspecified: Secondary | ICD-10-CM | POA: Insufficient documentation

## 2018-07-19 DIAGNOSIS — Z888 Allergy status to other drugs, medicaments and biological substances status: Secondary | ICD-10-CM | POA: Insufficient documentation

## 2018-07-19 DIAGNOSIS — R0789 Other chest pain: Secondary | ICD-10-CM | POA: Diagnosis present

## 2018-07-19 DIAGNOSIS — Z8249 Family history of ischemic heart disease and other diseases of the circulatory system: Secondary | ICD-10-CM | POA: Insufficient documentation

## 2018-07-19 DIAGNOSIS — E119 Type 2 diabetes mellitus without complications: Secondary | ICD-10-CM

## 2018-07-19 DIAGNOSIS — E1165 Type 2 diabetes mellitus with hyperglycemia: Secondary | ICD-10-CM | POA: Diagnosis not present

## 2018-07-19 DIAGNOSIS — Z833 Family history of diabetes mellitus: Secondary | ICD-10-CM | POA: Insufficient documentation

## 2018-07-19 DIAGNOSIS — Z881 Allergy status to other antibiotic agents status: Secondary | ICD-10-CM | POA: Insufficient documentation

## 2018-07-19 DIAGNOSIS — Z880 Allergy status to penicillin: Secondary | ICD-10-CM | POA: Diagnosis not present

## 2018-07-19 DIAGNOSIS — K219 Gastro-esophageal reflux disease without esophagitis: Secondary | ICD-10-CM | POA: Diagnosis not present

## 2018-07-19 DIAGNOSIS — E1129 Type 2 diabetes mellitus with other diabetic kidney complication: Secondary | ICD-10-CM | POA: Diagnosis present

## 2018-07-19 HISTORY — PX: LEFT HEART CATH AND CORONARY ANGIOGRAPHY: CATH118249

## 2018-07-19 LAB — GLUCOSE, CAPILLARY
GLUCOSE-CAPILLARY: 102 mg/dL — AB (ref 70–99)
Glucose-Capillary: 131 mg/dL — ABNORMAL HIGH (ref 70–99)

## 2018-07-19 SURGERY — LEFT HEART CATH AND CORONARY ANGIOGRAPHY
Anesthesia: LOCAL

## 2018-07-19 MED ORDER — SODIUM CHLORIDE 0.9% FLUSH: 3.0000 mL | INTRAVENOUS | Status: DC | PRN

## 2018-07-19 MED ORDER — HEPARIN (PORCINE) IN NACL 1000-0.9 UT/500ML-% IV SOLN
INTRAVENOUS | Status: AC
  Filled 2018-07-19: qty 1000

## 2018-07-19 MED ORDER — HEPARIN (PORCINE) IN NACL 1000-0.9 UT/500ML-% IV SOLN
INTRAVENOUS | Status: DC | PRN
  Administered 2018-07-19 (×2): 500 mL

## 2018-07-19 MED ORDER — SODIUM CHLORIDE 0.9 % WEIGHT BASED INFUSION
3.0000 mL/kg/h | INTRAVENOUS | Status: AC
  Administered 2018-07-19: 3 mL/kg/h via INTRAVENOUS

## 2018-07-19 MED ORDER — LIDOCAINE HCL (PF) 1 % IJ SOLN
INTRAMUSCULAR | Status: DC | PRN
  Administered 2018-07-19: 2 mL

## 2018-07-19 MED ORDER — LIDOCAINE HCL (PF) 1 % IJ SOLN
INTRAMUSCULAR | Status: AC
  Filled 2018-07-19: qty 30

## 2018-07-19 MED ORDER — SODIUM CHLORIDE 0.9 % IV SOLN: INTRAVENOUS | Status: DC

## 2018-07-19 MED ORDER — MIDAZOLAM HCL 2 MG/2ML IJ SOLN
INTRAMUSCULAR | Status: DC | PRN
  Administered 2018-07-19: 1 mg via INTRAVENOUS

## 2018-07-19 MED ORDER — ACETAMINOPHEN 325 MG PO TABS: 650.0000 mg | ORAL_TABLET | ORAL | Status: DC | PRN

## 2018-07-19 MED ORDER — SODIUM CHLORIDE 0.9 % WEIGHT BASED INFUSION: 1.0000 mL/kg/h | INTRAVENOUS | Status: DC

## 2018-07-19 MED ORDER — IOHEXOL 350 MG/ML SOLN
INTRAVENOUS | Status: DC | PRN
  Administered 2018-07-19: 90 mL via INTRACARDIAC

## 2018-07-19 MED ORDER — FENTANYL CITRATE (PF) 100 MCG/2ML IJ SOLN
INTRAMUSCULAR | Status: DC | PRN
  Administered 2018-07-19: 25 ug via INTRAVENOUS

## 2018-07-19 MED ORDER — HEPARIN SODIUM (PORCINE) 1000 UNIT/ML IJ SOLN
INTRAMUSCULAR | Status: AC
  Filled 2018-07-19: qty 1

## 2018-07-19 MED ORDER — ASPIRIN 81 MG PO CHEW: 81.0000 mg | CHEWABLE_TABLET | ORAL | Status: DC

## 2018-07-19 MED ORDER — VERAPAMIL HCL 2.5 MG/ML IV SOLN
INTRAVENOUS | Status: AC
  Filled 2018-07-19: qty 2

## 2018-07-19 MED ORDER — HEPARIN SODIUM (PORCINE) 1000 UNIT/ML IJ SOLN
INTRAMUSCULAR | Status: DC | PRN
  Administered 2018-07-19: 4500 [IU] via INTRAVENOUS

## 2018-07-19 MED ORDER — MIDAZOLAM HCL 2 MG/2ML IJ SOLN
INTRAMUSCULAR | Status: AC
  Filled 2018-07-19: qty 2

## 2018-07-19 MED ORDER — FENTANYL CITRATE (PF) 100 MCG/2ML IJ SOLN
INTRAMUSCULAR | Status: AC
  Filled 2018-07-19: qty 2

## 2018-07-19 MED ORDER — ONDANSETRON HCL 4 MG/2ML IJ SOLN: 4.0000 mg | Freq: Four times a day (QID) | INTRAMUSCULAR | Status: DC | PRN

## 2018-07-19 MED ORDER — VERAPAMIL HCL 2.5 MG/ML IV SOLN
INTRAVENOUS | Status: DC | PRN
  Administered 2018-07-19: 10 mL via INTRA_ARTERIAL

## 2018-07-19 MED ORDER — SODIUM CHLORIDE 0.9 % IV SOLN: 250.0000 mL | INTRAVENOUS | Status: DC | PRN

## 2018-07-19 MED ORDER — SODIUM CHLORIDE 0.9% FLUSH: 3.0000 mL | Freq: Two times a day (BID) | INTRAVENOUS | Status: DC

## 2018-07-19 SURGICAL SUPPLY — 15 items
CATH INFINITI 5 FR 3DRC (CATHETERS) ×1 IMPLANT
CATH INFINITI 5 FR AR1 MOD (CATHETERS) ×1 IMPLANT
CATH INFINITI 5 FR AR2 MOD (CATHETERS) ×1 IMPLANT
CATH INFINITI 5FR ANG PIGTAIL (CATHETERS) ×1 IMPLANT
CATH OPTITORQUE TIG 4.0 5F (CATHETERS) ×1 IMPLANT
DEVICE RAD COMP TR BAND LRG (VASCULAR PRODUCTS) ×1 IMPLANT
GLIDESHEATH SLEND A-KIT 6F 22G (SHEATH) ×1 IMPLANT
GUIDEWIRE INQWIRE 1.5J.035X260 (WIRE) IMPLANT
INQWIRE 1.5J .035X260CM (WIRE) ×2
KIT HEART LEFT (KITS) ×2 IMPLANT
PACK CARDIAC CATHETERIZATION (CUSTOM PROCEDURE TRAY) ×2 IMPLANT
SHEATH PROBE COVER 6X72 (BAG) ×1 IMPLANT
SYR MEDRAD MARK 7 150ML (SYRINGE) ×2 IMPLANT
TRANSDUCER W/STOPCOCK (MISCELLANEOUS) ×2 IMPLANT
TUBING CIL FLEX 10 FLL-RA (TUBING) ×2 IMPLANT

## 2018-07-19 NOTE — Discharge Instructions (Signed)
Drink plenty of fluids over next 48 hours and keep right wrist elevated at heart level for 24 hours Hold Metformin for 48 hours  Radial Site Care  This sheet gives you information about how to care for yourself after your procedure. Your health care provider may also give you more specific instructions. If you have problems or questions, contact your health care provider. What can I expect after the procedure? After the procedure, it is common to have:  Bruising and tenderness at the catheter insertion area. Follow these instructions at home: Medicines  Take over-the-counter and prescription medicines only as told by your health care provider. Insertion site care  Follow instructions from your health care provider about how to take care of your insertion site. Make sure you: ? Wash your hands with soap and water before you change your bandage (dressing). If soap and water are not available, use hand sanitizer. ? Change your dressing as told by your health care provider. ? Leave stitches (sutures), skin glue, or adhesive strips in place. These skin closures may need to stay in place for 2 weeks or longer. If adhesive strip edges start to loosen and curl up, you may trim the loose edges. Do not remove adhesive strips completely unless your health care provider tells you to do that.  Check your insertion site every day for signs of infection. Check for: ? Redness, swelling, or pain. ? Fluid or blood. ? Pus or a bad smell. ? Warmth.  Do not take baths, swim, or use a hot tub until your health care provider approves.  You may shower 24-48 hours after the procedure, or as directed by your health care provider. ? Remove the dressing and gently wash the site with plain soap and water. ? Pat the area dry with a clean towel. ? Do not rub the site. That could cause bleeding.  Do not apply powder or lotion to the site. Activity   For 24 hours after the procedure, or as directed by your  health care provider: ? Do not flex or bend the affected arm. ? Do not push or pull heavy objects with the affected arm. ? Do not drive yourself home from the hospital or clinic. You may drive 24 hours after the procedure unless your health care provider tells you not to. ? Do not operate machinery or power tools.  Do not lift anything that is heavier than 10 lb (4.5 kg), or the limit that you are told, until your health care provider says that it is safe.  Ask your health care provider when it is okay to: ? Return to work or school. ? Resume usual physical activities or sports. ? Resume sexual activity. General instructions  If the catheter site starts to bleed, raise your arm and put firm pressure on the site. If the bleeding does not stop, get help right away. This is a medical emergency.  If you went home on the same day as your procedure, a responsible adult should be with you for the first 24 hours after you arrive home.  Keep all follow-up visits as told by your health care provider. This is important. Contact a health care provider if:  You have a fever.  You have redness, swelling, or yellow drainage around your insertion site. Get help right away if:  You have unusual pain at the radial site.  The catheter insertion area swells very fast.  The insertion area is bleeding, and the bleeding does not stop when  you hold steady pressure on the area.  Your arm or hand becomes pale, cool, tingly, or numb. These symptoms may represent a serious problem that is an emergency. Do not wait to see if the symptoms will go away. Get medical help right away. Call your local emergency services (911 in the [place] Do not drive yourself to the hospital. Summary  After the procedure, it is common to have bruising and tenderness at the site.  Follow instructions from your health care provider about how to take care of your radial site wound. Check the wound every day for signs of  infection.  Do not lift anything that is heavier than 10 lb (4.5 kg), or the limit that you are told, until your health care provider says that it is safe. This information is not intended to replace advice given to you by your health care provider. Make sure you discuss any questions you have with your health care provider. Document Released: 07/24/2010 Document Revised: 07/27/2017 Document Reviewed: 07/27/2017 Elsevier Interactive Patient Education  2019 [employer].

## 2018-07-19 NOTE — Interval H&P Note (Signed)
History and Physical Interval Note:  07/19/2018 8:23 AM  [name]  has presented today for surgery, with the diagnosis of Unstable Angina  The various methods of treatment have been discussed with the patient and family. After consideration of risks, benefits and other options for treatment, the patient has consented to  Procedure(s): LEFT HEART CATH AND CORONARY ANGIOGRAPHY (N/A) as a surgical intervention .  The patient's history has been reviewed, patient examined, no change in status, stable for surgery.  I have reviewed the patient's chart and labs.  Questions were answered to the patient's satisfaction.     2016 Appropriate Use Criteria for Coronary Revascularization in Patients With Acute Coronary Syndrome NSTEMI/UA Intermediate Risk (TIMI Score 3-4) NSTEMI/Unstable angina, stabilized patient at Intermediate Risk (TIMI Score 3-4) Link Here: http://dodson-rose.net/ Indication:  Revascularization by PCI or CABG of 1 or more arteries in a patient with NSTEMI or unstable angina with Stabilization after presentation Intermediate risk for clinical events  A (7) Indication: 16; Score 7    [place]

## 2018-07-27 DIAGNOSIS — R0789 Other chest pain: Secondary | ICD-10-CM | POA: Diagnosis not present

## 2018-07-27 DIAGNOSIS — Z7952 Long term (current) use of systemic steroids: Secondary | ICD-10-CM | POA: Diagnosis not present

## 2018-07-27 DIAGNOSIS — E876 Hypokalemia: Secondary | ICD-10-CM | POA: Diagnosis not present

## 2018-07-27 DIAGNOSIS — E7800 Pure hypercholesterolemia, unspecified: Secondary | ICD-10-CM | POA: Diagnosis not present

## 2018-07-27 DIAGNOSIS — E1165 Type 2 diabetes mellitus with hyperglycemia: Secondary | ICD-10-CM | POA: Diagnosis not present

## 2018-07-27 DIAGNOSIS — Z0189 Encounter for other specified special examinations: Secondary | ICD-10-CM | POA: Diagnosis not present

## 2018-07-27 DIAGNOSIS — E559 Vitamin D deficiency, unspecified: Secondary | ICD-10-CM | POA: Diagnosis not present

## 2018-07-27 DIAGNOSIS — I10 Essential (primary) hypertension: Secondary | ICD-10-CM | POA: Diagnosis not present

## 2018-07-27 DIAGNOSIS — G4733 Obstructive sleep apnea (adult) (pediatric): Secondary | ICD-10-CM | POA: Diagnosis not present

## 2018-07-27 LAB — LIPID PANEL
CHOLESTEROL: 135 (ref 0–200)
HDL: 54 (ref 35–70)
LDL Cholesterol: 61
Triglycerides: 108 (ref 40–160)

## 2018-07-27 LAB — HEMOGLOBIN A1C: Hemoglobin A1C: 7.7

## 2018-08-09 ENCOUNTER — Encounter: Payer: Self-pay | Admitting: *Deleted

## 2018-08-10 ENCOUNTER — Other Ambulatory Visit: Payer: Self-pay | Admitting: Cardiology

## 2018-08-10 DIAGNOSIS — I209 Angina pectoris, unspecified: Secondary | ICD-10-CM

## 2018-08-14 DIAGNOSIS — E119 Type 2 diabetes mellitus without complications: Secondary | ICD-10-CM | POA: Diagnosis not present

## 2018-08-15 ENCOUNTER — Encounter: Payer: Self-pay | Admitting: Family Medicine

## 2018-08-15 ENCOUNTER — Other Ambulatory Visit: Payer: Self-pay | Admitting: Cardiology

## 2018-08-15 DIAGNOSIS — I209 Angina pectoris, unspecified: Secondary | ICD-10-CM

## 2018-09-12 ENCOUNTER — Other Ambulatory Visit: Payer: Self-pay | Admitting: Family Medicine

## 2018-09-12 DIAGNOSIS — I10 Essential (primary) hypertension: Secondary | ICD-10-CM

## 2018-09-21 DIAGNOSIS — E559 Vitamin D deficiency, unspecified: Secondary | ICD-10-CM | POA: Diagnosis not present

## 2018-09-21 DIAGNOSIS — I10 Essential (primary) hypertension: Secondary | ICD-10-CM | POA: Diagnosis not present

## 2018-09-21 DIAGNOSIS — R319 Hematuria, unspecified: Secondary | ICD-10-CM | POA: Diagnosis not present

## 2018-09-21 DIAGNOSIS — N182 Chronic kidney disease, stage 2 (mild): Secondary | ICD-10-CM | POA: Diagnosis not present

## 2018-10-23 DIAGNOSIS — Z79899 Other long term (current) drug therapy: Secondary | ICD-10-CM | POA: Diagnosis not present

## 2018-10-23 DIAGNOSIS — E7800 Pure hypercholesterolemia, unspecified: Secondary | ICD-10-CM | POA: Diagnosis not present

## 2018-10-23 DIAGNOSIS — E1165 Type 2 diabetes mellitus with hyperglycemia: Secondary | ICD-10-CM | POA: Diagnosis not present

## 2018-10-23 LAB — HEMOGLOBIN A1C: Hemoglobin A1C: 8.1

## 2018-10-23 LAB — LIPID PANEL: LDL Cholesterol: 53

## 2018-10-26 DIAGNOSIS — E7800 Pure hypercholesterolemia, unspecified: Secondary | ICD-10-CM | POA: Diagnosis not present

## 2018-10-26 DIAGNOSIS — Z7952 Long term (current) use of systemic steroids: Secondary | ICD-10-CM | POA: Diagnosis not present

## 2018-10-26 DIAGNOSIS — E1165 Type 2 diabetes mellitus with hyperglycemia: Secondary | ICD-10-CM | POA: Diagnosis not present

## 2018-10-26 DIAGNOSIS — I10 Essential (primary) hypertension: Secondary | ICD-10-CM | POA: Diagnosis not present

## 2018-11-02 ENCOUNTER — Encounter: Payer: Self-pay | Admitting: *Deleted

## 2018-12-07 ENCOUNTER — Other Ambulatory Visit: Payer: Self-pay | Admitting: Family Medicine

## 2018-12-07 DIAGNOSIS — I10 Essential (primary) hypertension: Secondary | ICD-10-CM

## 2018-12-07 NOTE — Telephone Encounter (Signed)
She hasn't been seen by me since September.  She has many other providers. If I am to remain her PCP, and prescribe medications, yes, she does need an office visit.  I sent in 30d refill

## 2018-12-07 NOTE — Telephone Encounter (Signed)
Does patient need an appt? 

## 2018-12-21 ENCOUNTER — Other Ambulatory Visit: Payer: Self-pay | Admitting: Family Medicine

## 2018-12-21 DIAGNOSIS — I10 Essential (primary) hypertension: Secondary | ICD-10-CM

## 2018-12-22 MED ORDER — AMLODIPINE BESYLATE 5 MG PO TABS: 5.0000 mg | ORAL_TABLET | Freq: Every day | ORAL | 0 refills | Status: DC

## 2018-12-22 NOTE — Addendum Note (Signed)
Addended by: Minette Headland A on: 12/22/2018 09:00 AM   Modules accepted: Orders

## 2019-01-08 ENCOUNTER — Other Ambulatory Visit: Payer: Self-pay | Admitting: Family Medicine

## 2019-01-08 DIAGNOSIS — E7800 Pure hypercholesterolemia, unspecified: Secondary | ICD-10-CM

## 2019-01-08 NOTE — Telephone Encounter (Signed)
Pt hasn't been seen by me since 03/2018 and isn't scheduled for her yearly CPE (due Sept). Lipids are monitored by others, last done in April and LDL at goal.   Okay to refill x90d, no refill. Needs CPE scheduled  (sees endo, nephro, cardiologist)--needs to see me once a year.

## 2019-01-08 NOTE — Telephone Encounter (Signed)
Is this okay to refill? 

## 2019-01-10 ENCOUNTER — Other Ambulatory Visit: Payer: Self-pay | Admitting: Family Medicine

## 2019-01-30 DIAGNOSIS — E1165 Type 2 diabetes mellitus with hyperglycemia: Secondary | ICD-10-CM | POA: Diagnosis not present

## 2019-01-30 DIAGNOSIS — E7800 Pure hypercholesterolemia, unspecified: Secondary | ICD-10-CM | POA: Diagnosis not present

## 2019-02-01 DIAGNOSIS — E7800 Pure hypercholesterolemia, unspecified: Secondary | ICD-10-CM | POA: Diagnosis not present

## 2019-02-01 DIAGNOSIS — I10 Essential (primary) hypertension: Secondary | ICD-10-CM | POA: Diagnosis not present

## 2019-02-01 DIAGNOSIS — E1165 Type 2 diabetes mellitus with hyperglycemia: Secondary | ICD-10-CM | POA: Diagnosis not present

## 2019-02-01 DIAGNOSIS — Z7952 Long term (current) use of systemic steroids: Secondary | ICD-10-CM | POA: Diagnosis not present

## 2019-02-01 LAB — CBC AND DIFFERENTIAL: Hemoglobin: 7.9 — AB (ref 12.0–16.0)

## 2019-02-01 LAB — HEMOGLOBIN A1C: Hemoglobin A1C: 7.9

## 2019-02-15 ENCOUNTER — Encounter: Payer: Self-pay | Admitting: Family Medicine

## 2019-02-15 ENCOUNTER — Ambulatory Visit: Payer: BC Managed Care – PPO | Admitting: Family Medicine

## 2019-02-15 ENCOUNTER — Other Ambulatory Visit: Payer: Self-pay

## 2019-02-15 VITALS — BP 122/70 | HR 83 | Temp 97.9°F | Resp 16 | Ht 65.0 in | Wt 178.8 lb

## 2019-02-15 DIAGNOSIS — M545 Low back pain, unspecified: Secondary | ICD-10-CM

## 2019-02-15 DIAGNOSIS — M62830 Muscle spasm of back: Secondary | ICD-10-CM | POA: Diagnosis not present

## 2019-02-15 LAB — POCT URINALYSIS DIP (PROADVANTAGE DEVICE)
Bilirubin, UA: NEGATIVE
Glucose, UA: NEGATIVE mg/dL
Ketones, POC UA: NEGATIVE mg/dL
Leukocytes, UA: NEGATIVE
Nitrite, UA: NEGATIVE
Protein Ur, POC: NEGATIVE mg/dL
Specific Gravity, Urine: 1.03
Urobilinogen, Ur: NEGATIVE
pH, UA: 6 (ref 5.0–8.0)

## 2019-02-15 MED ORDER — METHOCARBAMOL 500 MG PO TABS: 500.0000 mg | ORAL_TABLET | Freq: Four times a day (QID) | ORAL | 0 refills | Status: DC | PRN

## 2019-02-15 NOTE — Patient Instructions (Signed)
You have a muscle spasm in the left lower back. Heat, stretches and massage should help. We are prescribing a muscle relaxant that you can use as well.  Start at 1 pill, and increase to 2 if 1 pill doesn't help but doesn't have side effects. You can use it up to 4 times/day, if needed.  I don't think you will need this long-term. If pain persists, physical therapy could be an option.  Attached are some exercise that may be helpful in the future, to keep your back strong and help prevent problems.  Be sure to stay well hydrated.  You are more likely to have muscle spasms when you don't have enough water.     Back Exercises These exercises help to make your trunk and back strong. They also help to keep the lower back flexible. Doing these exercises can help to prevent back pain or lessen existing pain.  If you have back pain, try to do these exercises 2-3 times each day or as told by your doctor.  As you get better, do the exercises once each day. Repeat the exercises more often as told by your doctor.  To stop back pain from coming back, do the exercises once each day, or as told by your doctor. Exercises Single knee to chest Do these steps 3-5 times in a row for each leg: 1. Lie on your back on a firm bed or the floor with your legs stretched out. 2. Bring one knee to your chest. 3. Grab your knee or thigh with both hands and hold them it in place. 4. Pull on your knee until you feel a gentle stretch in your lower back or buttocks. 5. Keep doing the stretch for 10-30 seconds. 6. Slowly let go of your leg and straighten it. Pelvic tilt Do these steps 5-10 times in a row: 1. Lie on your back on a firm bed or the floor with your legs stretched out. 2. Bend your knees so they point up to the ceiling. Your feet should be flat on the floor. 3. Tighten your lower belly (abdomen) muscles to press your lower back against the floor. This will make your tailbone point up to the ceiling  instead of pointing down to your feet or the floor. 4. Stay in this position for 5-10 seconds while you gently tighten your muscles and breathe evenly. Cat-cow Do these steps until your lower back bends more easily: 1. Get on your hands and knees on a firm surface. Keep your hands under your shoulders, and keep your knees under your hips. You may put padding under your knees. 2. Let your head hang down toward your chest. Tighten (contract) the muscles in your belly. Point your tailbone toward the floor so your lower back becomes rounded like the back of a cat. 3. Stay in this position for 5 seconds. 4. Slowly lift your head. Let the muscles of your belly relax. Point your tailbone up toward the ceiling so your back forms a sagging arch like the back of a cow. 5. Stay in this position for 5 seconds.  Press-ups Do these steps 5-10 times in a row: 1. Lie on your belly (face-down) on the floor. 2. Place your hands near your head, about shoulder-width apart. 3. While you keep your back relaxed and keep your hips on the floor, slowly straighten your arms to raise the top half of your body and lift your shoulders. Do not use your back muscles. You may change where  you place your hands in order to make yourself more comfortable. 4. Stay in this position for 5 seconds. 5. Slowly return to lying flat on the floor.  Bridges Do these steps 10 times in a row: 1. Lie on your back on a firm surface. 2. Bend your knees so they point up to the ceiling. Your feet should be flat on the floor. Your arms should be flat at your sides, next to your body. 3. Tighten your butt muscles and lift your butt off the floor until your waist is almost as high as your knees. If you do not feel the muscles working in your butt and the back of your thighs, slide your feet 1-2 inches farther away from your butt. 4. Stay in this position for 3-5 seconds. 5. Slowly lower your butt to the floor, and let your butt muscles relax. If  this exercise is too easy, try doing it with your arms crossed over your chest. Belly crunches Do these steps 5-10 times in a row: 1. Lie on your back on a firm bed or the floor with your legs stretched out. 2. Bend your knees so they point up to the ceiling. Your feet should be flat on the floor. 3. Cross your arms over your chest. 4. Tip your chin a little bit toward your chest but do not bend your neck. 5. Tighten your belly muscles and slowly raise your chest just enough to lift your shoulder blades a tiny bit off of the floor. Avoid raising your body higher than that, because it can put too much stress on your low back. 6. Slowly lower your chest and your head to the floor. Back lifts Do these steps 5-10 times in a row: 1. Lie on your belly (face-down) with your arms at your sides, and rest your forehead on the floor. 2. Tighten the muscles in your legs and your butt. 3. Slowly lift your chest off of the floor while you keep your hips on the floor. Keep the back of your head in line with the curve in your back. Look at the floor while you do this. 4. Stay in this position for 3-5 seconds. 5. Slowly lower your chest and your face to the floor. Contact a doctor if:  Your back pain gets a lot worse when you do an exercise.  Your back pain does not get better 2 hours after you exercise. If you have any of these problems, stop doing the exercises. Do not do them again unless your doctor says it is okay. Get help right away if:  You have sudden, very bad back pain. If this happens, stop doing the exercises. Do not do them again unless your doctor says it is okay. This information is not intended to replace advice given to you by your health care provider. Make sure you discuss any questions you have with your health care provider. Document Released: 07/24/2010 Document Revised: 03/16/2018 Document Reviewed: 03/16/2018 Elsevier Patient Education  2020 [employer].

## 2019-02-15 NOTE — Progress Notes (Signed)
Chief Complaint  Patient presents with  . back pain    back pain X today, bilateral side pain X 3 days   She hadbeen having some pain on the upper sides of her abdomen on Wednesday (she thinks related to coughing). When she tried to get out of bed today, her back was hurting. She took a hot shower this morning, helped some, just temporarily. It hurts to walk, lean forward. No radiation of pain. Pain starts at the bra line, and extends down to the waist, hurts more on the left side. Hurts with coughing. She has been coughing again for the last 2 weeks, which may have triggered her sides to start hurting again.   She has been walking regularly for a month. No oher change in activity, no known injury  Tolerated ibuprofen in past, taken off due to being on prednisone.  Started on plavix by Dr. Einar Gip 08/2018--stopped after cath was normal. Verified d/c of med in 07/2018 letter.  PMH, PSH, SH reviewed  Outpatient Encounter Medications as of 02/15/2019  Medication Sig Note  . amLODipine (NORVASC) 5 MG tablet Take 1 tablet (5 mg total) by mouth daily.   Marland Kitchen aspirin 81 MG chewable tablet TAKE 1 TABLET BY MOUTH EVERY DAY   . atorvastatin (LIPITOR) 20 MG tablet TAKE 1 TABLET BY MOUTH EVERY DAY   . Cholecalciferol (VITAMIN D) 2000 units tablet Take 1 tablet (2,000 Units total) by mouth daily.   . Dulaglutide (TRULICITY) 7.06 CB/7.6EG SOPN Inject 0.75 mg into the skin every Sunday.    . folic acid (FOLVITE) 1 MG tablet Take 1 mg by mouth daily.    . hydrochlorothiazide (HYDRODIURIL) 25 MG tablet Take 25 mg by mouth daily.   Marland Kitchen KLOR-CON M20 20 MEQ tablet TAKE 1 TABLET BY MOUTH EVERY DAY   . metFORMIN (GLUCOPHAGE) 1000 MG tablet TAKE 1 TABLET TWICE A DAY WITH A MEAL (Patient taking differently: Take 1,000 mg by mouth 2 (two) times daily with a meal. )   . methotrexate 2.5 MG tablet Take 17.5 mg by mouth every Monday.    . metoprolol succinate (TOPROL-XL) 25 MG 24 hr tablet TAKE 1 TABLET BY MOUTH EVERY  EVENING   . predniSONE (DELTASONE) 5 MG tablet Take 10 mg by mouth daily with breakfast.    . [DISCONTINUED] clopidogrel (PLAVIX) 75 MG tablet TAKE 1 TABLET BY MOUTH EVERY DAY (Patient not taking: Reported on 02/15/2019) 02/15/2019: Unsure of this medication   No facility-administered encounter medications on file as of 02/15/2019.    ROS:  No fever, chills.  +cough, nonproductive. No SOB.  No URI symptoms or GI complaints. No bleeding, or rash.  +LBP per HPI.  No numbness, tingling, weakness, sciatica, bowel or bladder problems.   PHYSICAL EXAM:  BP 122/70   Pulse 83   Temp 97.9 F (36.6 C) (Temporal)   Resp 16   Ht 5\' 5"  (1.651 m)   Wt 178 lb 12.8 oz (81.1 kg)   LMP 10/09/2015   SpO2 98%   BMI 29.75 kg/m    Well appearing, pleasant female in no distress Neck: no lymphadenopathy or mass, spine nontender Back: Slightly tender at lower lumbar spine  Tight, tender paraspinous muscles on the left in lumbar area, soft and nontender on the right. Neuro: normal strength, sensation, negative SLR. Normal gait. DTR's 2+ and symmetric. She is alert and oriented. Psych: normal mood, affect, hygiene and grooming  Urine dip +blood (chronic), otherwise negative  ASSESSMENT/PLAN:  Low back pain  without sciatica, unspecified back pain laterality, unspecified chronicity - Plan: POCT Urinalysis DIP (Proadvantage Device)  Muscle spasm of back - Plan: methocarbamol (ROBAXIN) 500 MG tablet   You have a muscle spasm in the left lower back. Heat, stretches and massage should help. We are prescribing a muscle relaxant that you can use as well.  Start at 1 pill, and increase to 2 if 1 pill doesn't help but doesn't have side effects. You can use it up to 4 times/day, if needed.  I don't think you will need this long-term. If pain persists, physical therapy could be an option.  Attached are some exercise that may be helpful in the future, to keep your back strong and help prevent problems.  Be sure  to stay well hydrated.  You are more likely to have muscle spasms when you don't have enough water.

## 2019-02-26 DIAGNOSIS — M9901 Segmental and somatic dysfunction of cervical region: Secondary | ICD-10-CM | POA: Diagnosis not present

## 2019-02-26 DIAGNOSIS — M9903 Segmental and somatic dysfunction of lumbar region: Secondary | ICD-10-CM | POA: Diagnosis not present

## 2019-02-26 DIAGNOSIS — M9902 Segmental and somatic dysfunction of thoracic region: Secondary | ICD-10-CM | POA: Diagnosis not present

## 2019-02-26 DIAGNOSIS — M545 Low back pain: Secondary | ICD-10-CM | POA: Diagnosis not present

## 2019-03-07 ENCOUNTER — Telehealth: Payer: Self-pay | Admitting: *Deleted

## 2019-03-07 DIAGNOSIS — M9901 Segmental and somatic dysfunction of cervical region: Secondary | ICD-10-CM | POA: Diagnosis not present

## 2019-03-07 DIAGNOSIS — M545 Low back pain: Secondary | ICD-10-CM | POA: Diagnosis not present

## 2019-03-07 DIAGNOSIS — M9902 Segmental and somatic dysfunction of thoracic region: Secondary | ICD-10-CM | POA: Diagnosis not present

## 2019-03-07 DIAGNOSIS — M9903 Segmental and somatic dysfunction of lumbar region: Secondary | ICD-10-CM | POA: Diagnosis not present

## 2019-03-07 NOTE — Telephone Encounter (Signed)
A headache for over a week is NOT an indication for an MRI.  She definitely requires a visit--need to know her blood pressure, need to know WHERE her pain is, and whether or not she has any other symptoms. Obviously if she has fever, body aches, or other COVID symptoms (which can include headache), then needs to be virtual.  If no other symptoms, and is lasting this long, likely okay for in person visit, but we can START with virtual (have her know that potentially she may need in office visit if headache persists and further exam is needed).

## 2019-03-07 NOTE — Telephone Encounter (Signed)
Patient called and she states that she was seen and told she should see chiro for her back pain. She is doing that and it is helping. She has had a HA for over a week now and wants to know if you will order an MRI. I told her you would probably need to see her first. Please advise, and if so would you like virtual or in person.

## 2019-03-07 NOTE — Telephone Encounter (Signed)
Patient scheduled VIRTUAL for tomorrow @ 11:15am.

## 2019-03-08 ENCOUNTER — Other Ambulatory Visit: Payer: Self-pay

## 2019-03-08 ENCOUNTER — Encounter: Payer: Self-pay | Admitting: Family Medicine

## 2019-03-08 ENCOUNTER — Ambulatory Visit (INDEPENDENT_AMBULATORY_CARE_PROVIDER_SITE_OTHER): Payer: BC Managed Care – PPO | Admitting: Family Medicine

## 2019-03-08 VITALS — BP 120/69 | HR 86 | Ht 65.0 in | Wt 180.0 lb

## 2019-03-08 DIAGNOSIS — R519 Headache, unspecified: Secondary | ICD-10-CM

## 2019-03-08 DIAGNOSIS — R51 Headache: Secondary | ICD-10-CM | POA: Diagnosis not present

## 2019-03-08 NOTE — Progress Notes (Signed)
Start time: 12:12 End time: 12:27  Virtual Visit via Video Note  I connected with Michelle Mack on 03/08/19 by a video enabled telemedicine application and verified that I am speaking with the correct person using two identifiers.  Location: Patient: home alone Provider: office   I discussed the limitations of evaluation and management by telemedicine and the availability of in person appointments. The patient expressed understanding and agreed to proceed.  History of Present Illness:  Chief Complaint  Patient presents with  . Headache    x 1 week. Is severe and pain is stabbing.     While at work last week, and on the phone with her daughter (with earbuds in) and walking, she developed a sharp pain at the right top of her head. It was short-lived, but has been coming frequently.  It is NOT a headache, just some sharp, short-lived pains on the top/right side of her head.  Isn't having them daily. Pain is always in the same spot.  Hair has been braided for a whole month, not changed.  In past has gotten headaches from tight braids, not sharp pains like these.    Denies headaches, numbness, tingling (just some in her fingers in both hands, which she relates to her exercise, if she walks a lot; she squeezes them (opens and closes fists) and feeling resolves). No muscle weakness. Denies dizziness (only if she bends down) No problems with speech, thoughts (just once saying neighbors name yesterday)  PMH, [place], [place] reviewed  Outpatient Encounter Medications as of 03/08/2019  Medication Sig  . amLODipine (NORVASC) 5 MG tablet Take 1 tablet (5 mg total) by mouth daily.  Marland Kitchen aspirin 81 MG chewable tablet TAKE 1 TABLET BY MOUTH EVERY DAY  . atorvastatin (LIPITOR) 20 MG tablet TAKE 1 TABLET BY MOUTH EVERY DAY  . Cholecalciferol (VITAMIN D) 2000 units tablet Take 1 tablet (2,000 Units total) by mouth daily.  . Dulaglutide (TRULICITY) A999333 0000000 SOPN Inject 0.75 mg into the skin every  Sunday.   . folic acid (FOLVITE) 1 MG tablet Take 1 mg by mouth daily.   . hydrochlorothiazide (HYDRODIURIL) 25 MG tablet Take 25 mg by mouth daily.  Marland Kitchen KLOR-CON M20 20 MEQ tablet TAKE 1 TABLET BY MOUTH EVERY DAY  . metFORMIN (GLUCOPHAGE) 1000 MG tablet TAKE 1 TABLET TWICE A DAY WITH A MEAL (Patient taking differently: Take 1,000 mg by mouth 2 (two) times daily with a meal. )  . methotrexate 2.5 MG tablet Take 17.5 mg by mouth every Monday.   . predniSONE (DELTASONE) 5 MG tablet Take 10 mg by mouth daily with breakfast.   . methocarbamol (ROBAXIN) 500 MG tablet Take 1-2 tablets (500-1,000 mg total) by mouth every 6 (six) hours as needed for muscle spasms. (Patient not taking: Reported on 03/08/2019)  . metoprolol succinate (TOPROL-XL) 25 MG 24 hr tablet TAKE 1 TABLET BY MOUTH EVERY EVENING (Patient not taking: Reported on 03/08/2019)   No facility-administered encounter medications on file as of 03/08/2019.    Allergies  Allergen Reactions  . Ampicillin Shortness Of Breath  . Penicillins Shortness Of Breath and Swelling    .   Marland Kitchen Cephalosporins Itching  . Nitrofuran Derivatives Itching    Throat, mouth, hands itch  . Sulfamethoxazole-Trimethoprim Other (See Comments)    "body on fire"  . Sulfur Other (See Comments)    Feeling of burning on the inside  Persist ant cough  . Mobic [Meloxicam] Swelling and Rash    Pt states  lips also swelled.   ROS:  No fever, chills, URI symptoms, GI complaints.  No vision changes. No headaches, just the short-lived sharp, right-sided pains per HPI.  No nausea, vomiting, weakness, or other neuro complaints.  See HPI.    Observations/Objective:  BP 120/69   Pulse 86   Ht 5\' 5"  (1.651 m)   Wt 180 lb (81.6 kg)   LMP 10/09/2015   BMI 29.95 kg/m   Wt Readings from Last 3 Encounters:  03/08/19 180 lb (81.6 kg)  02/15/19 178 lb 12.8 oz (81.1 kg)  07/19/18 185 lb (83.9 kg)   Well-appearing, pleasant female, in good spirits. nontender to her palpation on  exam (at area of sharp pains and her temporalis muscles). Cranial nerves intact, tested via video. She is alert and oriented, with normal speech, mood, affect and grooming.  Hair is tightly braided.   Assessment and Plan:  Nonintractable episodic headache, unspecified headache type - short-lived, sharp pain at right top of head.  Denies any headache, no residual discomfort after pain passes.  No neuro sx--Ddx reviewed, pt reassured  Reassured that her pain sound external to her skull, therefore not related to her brain, and MRI not indicated (that was her request when she called with pain).  Counseled re: neuro symptoms that would be alarming, and to seek immediate care if they develop.  Encouraged her to drink more water, to be sure that she isn't clenching teeth.  To let us know if pain becomes more persistent/frequent.  All questions answered.  Follow Up Instructions:    I discussed the assessment and treatment plan with the patient. The patient was provided an opportunity to ask questions and all were answered. The patient agreed with the plan and demonstrated an understanding of the instructions.   The patient was advised to call back or seek an in-person evaluation if the symptoms worsen or if the condition fails to improve as anticipated.  I provided 15 minutes of non-face-to-face time during this encounter.   Vikki Ports, MD

## 2019-03-08 NOTE — Patient Instructions (Signed)
We discussed your head pain today.  The very sharp, very short-lived episodic pain to the top of the right side of your head doesn't sound like there is anything intracranial (seems to be external to your skull).  If you have any new neurologic symptoms develop, please let us know.  These could include vision changes, problems thinking, speaking, weakness, numbness, tingling, headaches, nausea/vomiting, vertigo, falling.

## 2019-03-25 ENCOUNTER — Other Ambulatory Visit: Payer: Self-pay | Admitting: Family Medicine

## 2019-03-25 DIAGNOSIS — I10 Essential (primary) hypertension: Secondary | ICD-10-CM

## 2019-04-10 ENCOUNTER — Other Ambulatory Visit: Payer: Self-pay | Admitting: Family Medicine

## 2019-04-10 DIAGNOSIS — E7800 Pure hypercholesterolemia, unspecified: Secondary | ICD-10-CM

## 2019-04-10 NOTE — Progress Notes (Signed)
Chief Complaint  Patient presents with  . Annual Exam    fasting annual exam. Tried to do eye exam but patient had difficulty and then she told me she sees eye doctor (but can't remember when she last saw-thinks she is due). Wants to have pap today, said she sees GYN but not since pandemic. Both knees are hurting her for about a week. Has some vaginal itching as well for about 3 days.   . Immunizations    concerned about insurance paying for these shots.   Michelle Mack is a 53 y.o. female who presents for a complete physical.  She has the following concerns:   Bilateral knee pain for a week. She thinks it started after wearing the "wrong shoes" one day. Wants to make sure everything is okay. Vaginal itching x 3 days. She denies any associated discharge. Requesting breast/pelvic exam today, since past due for GYN visit.  She was seen a month ago with pain in her head (lasted a week or two, then resolved), and the month prior with low back pain and muscle spasm. She has been seeing the chiropractor weekly which is helping.  Hypertension: She is currently taking 48m amlodipine, and HCTZ 261m(not taking toprol XL that was on med list). She also takes potassium supplement.  BP'sare not checked elsewhere, just other doctors.She denies chest pain, palpitations, exertional dyspnea, edema, headaches.   Diabetes: She is under the care of Dr. AlElyse Hsulast seen 01/2019.  Last A1c was 7.9.  See labs done below.  Due for f/u next week, to change meds if not better.  She remains on chronic prednisone.  She is taking Trulicity and metformin.  Previously took FaIranstopped due to vaginitis and UTI. She has never been on ACE/ARB She had urine albumin/Cr ratio checked in 10/2018 by Dr. AlElyse Hsund was normal.  She sees podiatrist regularly, denies any foot concerns. Last eye exam was over a year ago (she cannot recall the last visit, didn't get any notification that she is due).  She had trouble  reading the vision test today, didn't have her glasses with her.  Denies any problems reading, seeing signs, etc.  Hyperlipidemia: She reports compliance with atorvastatin, denies side effects. Last lipids were done 01/2019, see below. She had cardiac cath in 07/2018 after hospitalized with chest pain.  She had normal coronary arteries.  No further chest pain  Vitamin D deficiency:She is compliant with taking 2000 IU of D3 daily. Last level was: Vitamin D 25 OH, LC Tandem MASS Spectrom (04/18/2017 8:11 AM EDT) Vitamin D 25 OH, LC Tandem MASS Spectrom (04/18/2017 8:11 AM EDT)  Component Value Ref Range Performed At Pathologist Signature  Total Vitamin D 25-Hydroxy 52Comment: >30 NG/ML OPTIMAL RANGE 10-30 NG/ML INSUFFICIENT <10 NG/ML DEFICIENT >=30 NG/ML [place] [facility]   Vitamin D2 10 NG/ML [place] [facility]   Vitamin D3 42 NG/ML [place] [facility]     OSA: She reports she hasn't used her CPAP machine in about 5 years, "seems like I don't need it".  Denies unrefreshed sleep, daytime somnolence.  This was managed by her pulmonologist.  He mentioned scheduling a f/u sleep study, but hasn't been done yet.  Other doctors caring for patient: Endo: Dr. AlElyse HsuYN: Dr. RiCletis Mediaheum: Dr. AnVeneta PentonWF, forrelapsing polychondritis)--he left, hasn't seen replacement Pulm: Dr. NaEarly OsmondWF) and Ramaswamy Podiatrist: Dr. StCannon Kettlebunions) GI: Dr. MaCollene Maresentist: can't recall name, goes once yearly  Ophtho: [facility]uella Ghazi at [facility] (yearly diabetic eye exams, October) Cardiology: Dr. Einar Gip (normal cath 07/2018) Nephro: Dr. Posey Pronto at [facility] (last seen 10/2018)   Immunization History  Administered Date(s) Administered  . Hepatitis A 12/27/2007, 01/24/2008, 07/17/2008  . Hepatitis B 12/27/2007, 01/24/2008, 07/17/2008  . IPV 12/27/2007  . MMR 12/27/2007  . Meningococcal Polysaccharide 12/27/2007  . PPD Test 04/14/2011, 06/08/2013, 01/01/2015,  03/08/2017  . Td 12/28/1995  . Tdap 12/27/2007  . Typhoid Inactivated 12/27/2007  . Varicella 12/27/2007  . Yellow Fever 01/24/2008   Last Pap smear:12/2017 by Dr. Cletis Media Last mammogram:01/2017 Last colonoscopy:12/2016 with Dr. Collene Mares. Internal hemorrhoids, repeat 10 years Last DEXA:never.  No period in 7 years per pt (?unsure if due to ablation, vs menopause); chronic prednisone use. Dentist:goes yearly Ophtho:yearly in October Exercise:Walk/runs about 7 miles every day.  Does weights 3x/week.  Past Medical History:  Diagnosis Date  . Anemia    on iron  . Back pain    tx with ibuprofen 877m  . Bronchitis    treated recently by abx  . Cough    non-productive  . DUB (dysfunctional uterine bleeding)   . Fibroid uterus    GYN--Dr. Rivard  . GERD (gastroesophageal reflux disease)   . Hypertension   . Infertility, female   . Polychondritis   . Recurrent upper respiratory infection (URI)    in October - tx with abx  . Type II or unspecified type diabetes mellitus without mention of complication, not stated as uncontrolled    diagnosed by Dr. HBerdine Addison   Past Surgical History:  Procedure Laterality Date  . [facility] bronchus and LUL  . ENDOMETRIAL VAPORIZATION W/ VERSAPOINT    . LEFT HEART CATH AND CORONARY ANGIOGRAPHY N/A 07/19/2018   Procedure: LEFT HEART CATH AND CORONARY ANGIOGRAPHY;  Surgeon: PNigel Mormon MD;  [facility];  Service: Cardiovascular;  Laterality: N/A;  . MYOMECTOMY  2002    Social History   Socioeconomic History  . Marital status: Married    Spouse name: Not on file  . Number of children: Not on file  . Years of education: Not on file  . Highest education level: Not on file  Occupational History  . Occupation: CTherapist, artat [employer]  Social Needs  . Financial resource strain: Not on file  . Food  insecurity    Worry: Not on file    Inability: Not on file  . Transportation needs    Medical: Not on file    Non-medical: Not on file  Tobacco Use  . Smoking status: Never Smoker  . Smokeless tobacco: Never Used  Substance and Sexual Activity  . Alcohol use: Yes    Comment: 0-1 drink/week  . Drug use: No  . Sexual activity: Not Currently    Birth control/protection: None    Comment: not in the last year  Lifestyle  . Physical activity    Days per week: Not on file    Minutes per session: Not on file  . Stress: Not on file  Relationships  . Social c[place] phone: Not on file    Gets together: Not on file    Attends religious service: Not on file    Active member of club or organization: Not on file    Attends meetings of clubs or organizations: Not on file  Relationship status: Not on file  . Intimate partner violence    Fear of current or ex partner: Not on file    Emotionally abused: Not on file    Physically abused: Not on file    Forced sexual activity: Not on file  Other Topics Concern  . Not on file  Social History Narrative   Lives with husband. No pets. +passive tobacco exposure (husband smokes outside)    Family History  Problem Relation Age of Onset  . Hypertension Mother   . Cancer Mother        pancreatic cancer  . Diabetes Mother   . Hypertension Father   . Diabetes Father   . Cancer Father        prostate cancer  . Hypertension Sister   . Thyroid nodules Sister   . Diabetes Maternal Grandmother   . Breast cancer Maternal Grandmother   . Heart disease Maternal Grandmother   . Heart disease Paternal Grandmother     Outpatient Encounter Medications as of 04/11/2019  Medication Sig  . amLODipine (NORVASC) 5 MG tablet TAKE 1 TABLET BY MOUTH EVERY DAY  . atorvastatin (LIPITOR) 20 MG tablet TAKE 1 TABLET BY MOUTH EVERY DAY  . Cholecalciferol (VITAMIN D) 2000 units tablet Take 1 tablet (2,000 Units total) by mouth daily.  .  Dulaglutide (TRULICITY) 8.83 GP/4.9IY SOPN Inject 0.75 mg into the skin every Sunday.   . folic acid (FOLVITE) 1 MG tablet Take 1 mg by mouth daily.   . hydrochlorothiazide (HYDRODIURIL) 25 MG tablet Take 25 mg by mouth daily.  Marland Kitchen KLOR-CON M20 20 MEQ tablet TAKE 1 TABLET BY MOUTH EVERY DAY  . metFORMIN (GLUCOPHAGE) 1000 MG tablet TAKE 1 TABLET BY MOUTH 2 TIMES DAILY WITH MEALS. TAKE 1 TABLET TWICE A DAY FOR DIABETES  . methotrexate 2.5 MG tablet Take 2.5 mg by mouth once a week.   . predniSONE (DELTASONE) 5 MG tablet Take 10 mg by mouth daily with breakfast.   . [DISCONTINUED] metFORMIN (GLUCOPHAGE) 1000 MG tablet TAKE 1 TABLET TWICE A DAY WITH A MEAL (Patient taking differently: Take 1,000 mg by mouth 2 (two) times daily with a meal. )  . [DISCONTINUED] aspirin 81 MG chewable tablet TAKE 1 TABLET BY MOUTH EVERY DAY (Patient not taking: Reported on 04/11/2019)  . [DISCONTINUED] atorvastatin (LIPITOR) 20 MG tablet TAKE 1 TABLET BY MOUTH EVERY DAY  . [DISCONTINUED] KLOR-CON M20 20 MEQ tablet TAKE 1 TABLET BY MOUTH EVERY DAY  . [DISCONTINUED] methocarbamol (ROBAXIN) 500 MG tablet Take 1-2 tablets (500-1,000 mg total) by mouth every 6 (six) hours as needed for muscle spasms. (Patient not taking: Reported on 03/08/2019)  . [DISCONTINUED] metoprolol succinate (TOPROL-XL) 25 MG 24 hr tablet TAKE 1 TABLET BY MOUTH EVERY EVENING (Patient not taking: Reported on 03/08/2019)   No facility-administered encounter medications on file as of 04/11/2019.     Allergies  Allergen Reactions  . Ampicillin Shortness Of Breath  . Penicillins Shortness Of Breath and Swelling      . Cephalosporins Itching  . Nitrofuran Derivatives Itching    Throat, mouth, hands itch  . Sulfamethoxazole-Trimethoprim Other (See Comments)    "body on fire"  . Sulfur Other (See Comments)    Feeling of burning on the inside  Persist ant cough  . Mobic [Meloxicam] Swelling and Rash    Pt states lips also swelled.    ROS:The patient  denies anorexia, headaches, decreased hearing, ear pain, sore throat, breast concerns, chest pain, palpitations, dizziness, syncope,  dyspnea on exertion, cough, swelling, nausea, vomiting, diarrhea, abdominal pain, melena, hematochezia, indigestion/heartburn, hematuria, dysuria,vaginal bleeding,vaginal discharge, genital lesions, numbness, tingling, weakness, tremor, suspicious skin lesions, depression, anxiety, abnormal bleeding/bruising, or enlarged lymph nodes. No dysuria. Chronic microscopic hematuria. +mild constipation. Some hemorrhoidal bleeding, occasionally after straining Vision changes--trouble seeing computer at work. Wears glasses for distance. Vaginal itch x 3d, no itching Some bilateral knee pain recently Back pain improving with chiropractor treatments.   PHYSICAL EXAM:  BP 120/70   Pulse 80   Ht '5\' 5"'$  (1.651 m)   Wt 172 lb 6.4 oz (78.2 kg)   LMP 10/09/2015   BMI 28.69 kg/m   Wt Readings from Last 3 Encounters:  04/11/19 172 lb 6.4 oz (78.2 kg)  03/08/19 180 lb (81.6 kg)  02/15/19 178 lb 12.8 oz (81.1 kg)    General Appearance:  Alert, cooperative, no distress, appears stated age  Head:  Normocephalic, without obvious abnormality, atraumatic  Eyes:  PERRL, conjunctiva/corneas clear, EOM's intact, fundi benign. Slight proptotic appearance of eyes, chronic  Ears:  Normal TM's and external ear canals  Nose: Not examined, wearing mask due to COVID-19 pandemic  Throat:   Not examined, wearing mask due to COVID-19 pandemic  Neck: Supple, no lymphadenopathy; thyroid: no enlargement/ tenderness/nodules; no carotid bruit or JVD  Back:  Spine nontender, no curvature, ROM normal, no CVA tenderness  Lungs:  Clear to auscultation bilaterally without wheezes, rales or ronchi; respirations unlabored  Chest Wall:  No tenderness or deformity  Heart:  Regular rate and rhythm, S1 and S2 normal, no murmur, rub or gallop  Breast Exam:  No nipple inversion or  discharge.  No breast masses or axillary lymphadenopathy. Scar along lateral aspect of the right breast  Abdomen:  Soft, non-tender, nondistended, normoactive bowel sounds, no masses, no hepatosplenomegaly  Genitalia:  Normal external genitalia, without lesions.  BUS and vagina normal.  Small amount of white discharge, slightly thick consistency, no odor. Cervix is stenotic, no lesions noted. Pap performed.  No cervical motion tenderness, no adnexal masses or tenderness   Rectal: Normal sphincter tone, no masses, heme negative stool  Extremities: No clubbing, cyanosis or edema. +bunions bilaterally and discolored  2nd toenails.  Callous at right medial great toe. Normal monofilament exam. FROM of both knees with some crepitus.  Mildly tender on either side of patellar tendon at the left knee (not joint line). No effusion or warmth. No pain with varus/valgus stress. R knee nontender.  Pulses: 2+ and symmetric all extremities  Skin: Skin color, texture, turgor normal, no rashes or lesions  Lymph nodes: Cervical, supraclavicular, and axillary nodes normal  Neurologic: Normal strength, sensation and gait; reflexes 2+ and symmetric throughout  Psych: Normal mood, affect, hygiene and grooming    Lipid Profile (01/30/2019 8:06 AM EDT) Lipid Profile (01/30/2019 8:06 AM EDT)  Component Value Ref Range Performed At Pathologist Signature  LDL Direct 44 <130 mg/dL [place] [facility]   Total Cholesterol 117 25 - 199 MG/DL [place] [facility]   Triglycerides 119 10 - 150 MG/DL [place] [facility]   HDL Cholesterol 56 35 - 135 MG/DL [place] [facility]   Total Chol / HDL Cholesterol 2.1 <4.5  [facility]   Non-HDL Cholesterol 61Comment:  TARGET: <(LDL-C TARGET + 30)MG/DL MG/DL  [facility]    Comprehensive Metabolic Panel (02/40/9735 8:06  AM EDT) Comprehensive Metabolic Panel (32/99/2426 8:06 AM EDT)  Component Value Ref Range Performed At Pathologist Signature  Sodium 144 135 - 146 MMOL/L [place] [facility]   Potassium 3.7Comment: NO VISIBLE HEMOLYSIS 3.5 - 5.3 MMOL/L [place] [facility]   Chloride 104 98 - 110 MMOL/L [place] [facility]   CO2 33 (H) 23 - 30 MMOL/L [place] [facility]   BUN 19 8 - 24 MG/DL [place] [facility]   Glucose 96 70 - 99 MG/DL [place] [facility]   Creatinine 1.07 0.50 - 1.50 MG/DL [place] [facility]   Calcium 10.2 8.5 - 10.5 MG/DL [place] [facility]   Total Protein 6.7 6.0 - 8.3 G/DL [place] [facility]   Albumin  4.5 3.5 - 5.0 G/DL [place] [facility]   Total Bilirubin 1.1 0.1 - 1.2 MG/DL [place] [facility]   Alkaline Phosphatase 68 25 - 125 IU/L or U/L [place] [facility]   AST (SGOT) 24 5 - 40 IU/L or U/L [place] [facility]   ALT (SGPT) 29 5 - 50 IU/L or U/L [place] [facility]   Anion Gap 7 4 - 14 MMOL/L [place] [facility]   Est. GFR African American 69Comment: GFR estimated by CKD-EPI equations, reportable up to 90 ML/MIN/1.73 M*2 >=60 ML/MIN/1.73 M*2  [place] [facility]    Hemoglobin A1C (01/30/2019 8:06 AM EDT) Hemoglobin A1C (01/30/2019 8:06 AM EDT)  Component Value Ref Range Performed At Pathologist Signature  HEMOGLOBIN A1C 7.9 (H) Comment:  Normal:    Less than 5.7% Prediabetes: 5.7% to 6.4% Diabetes:   Greater than 6.4% <5.7 % [place] [facility]    CBC and Differential (10/23/2018 3:29 PM EDT) CBC and Differential (10/23/2018 3:29 PM EDT)  Component Value Ref Range Performed At Pathologist Signature  WBC 13.5 (H) 4.8 - 10.8 x 10*3/uL [place] [facility]   RBC 4.62 4.20 - 5.40 x 10*6/uL [place] [facility]   Hemoglobin 13.2 12.0 - 16.0 G/DL [place] [facility]   Hematocrit 40.6 37.0 - 47.0 % [place] [facility]   MCV 87.9 81.0 - 99.0 FL [place] [facility]   MCH 28.5 27.0 - 31.0 PG [place] [facility]   MCHC 32.4 (L) 33.0 - 37.0 G/DL [place] [facility]   RDW 13.8 11.5 - 14.5 % [place] [facility]   Platelets 287 160 - 360 X 10*3/uL [place] [facility]   MPV 8.0 6.8 - 10.2 FL [place] [facility]   Neutrophil % 85 % [place] [facility]   Lymphocyte % 11 % [place] [facility]   Monocyte % 4 % [place] [facility]   Eosinophil % 0 % [place] [facility]   Basophil % 0 % [place] [facility]   Neutrophil Absolute 11.4 (H) 1.6 - 7.3 x 10*3/uL [place] [facility]   Lymphocyte Absolute 1.5 1.0 - 5.1 x 10*3/uL [place] [facility]   Monocyte Absolute 0.5 0.1 - 0.9 x 10*3/uL [place] [facility]  Eosinophil Absolute 0.0 0.0 - 0.5 x 10*3/uL [place] [facility]   Basophil Absolute 0.0 0.0 - 0.2 x 10*3/uL [place] [facility]   NRBC Manual 0 <=0 / 100 WBC [place] [facility]   nRBC   [place] [facility]      ASSESSMENT/PLAN:  Annual physical exam - Plan: POCT Urinalysis DIP (Proadvantage Device), Cytology - PAP([place])  Type 2 diabetes mellitus with stage 2 chronic kidney disease, without long-term current use of insulin (HCC) - suboptimally controlled per last A1c with endocrinologist, has f/u appt next week  Hypercholesteremia - at goal per last check, cont current dose of statin  OSA (obstructive sleep apnea) - previously diagnosed, not currently on treatment. Rec  she discuss this with her pulmonologist (who treated), consider f/u test  Essential hypertension, benign - controlled  Vitamin D deficiency - continue daily supplement  Relapsing polychondritis - stable  CKD (chronic kidney disease) stage 2, GFR 60-89 ml/min - stable; saw nephro in April  Postmenopausal estrogen deficiency - Plan: DG Bone Density  Current chronic use of systemic steroids - Plan: DG Bone Density  At high risk for osteoporosis - Plan: DG Bone Density  Vaginal itching - no yeast seen on KOH.  Reassured.  If worsening itch/discharge, can presumptively treat for yeast - Plan: POCT Wet Prep [facility])  Need for Tdap vaccination - Plan: Tdap vaccine greater than or equal to 7yo IM  Vaccine counseling - counseled re: flu shot, pneumovax, Tdap and Shingrix. Wants to check insurance before getting any  Knee pain--mild, reassured.  Rest some (decrease walking), ice after activity, expect improvement.   Reminded to schedule diabetic eye exam, mammogram. DEXA ordered due to risks (postmenopausal, chronic prednisone use).  Flu shot, Tdap, Pneumovax all recommended to be given today. Risks/side effects and reasons they are recommended were reviewed in great detail. She wanted to check with her insurance before getting any vaccines.  Encouraged for her to get them soon, not wait until next physical (least important/urgent would be the Shingrix).   Discussed monthly self breast exams and yearly mammograms; at least 30 minutes of aerobic activity at least 5 days/week, weight-bearing exercise at least 2x/wk; proper sunscreen use reviewed; healthy diet, including goals of calcium and vitamin D intake and alcohol recommendations (less than or equal to 1 drink/day) reviewed; regular seatbelt use; changing batteries in smoke detectors, recommend carbon monoxide detectors. Immunization recommendations discussed--flu shots yearly, pneumovax and TdaP all recommended. Shingrix recommended (to  check insurance). Colonoscopy recommendations reviewed, UTD, due again 2028.

## 2019-04-10 NOTE — Patient Instructions (Addendum)
  HEALTH MAINTENANCE RECOMMENDATIONS:  It is recommended that you get at least 30 minutes of aerobic exercise at least 5 days/week (for weight loss, you may need as much as 60-90 minutes). This can be any activity that gets your heart rate up. This can be divided in 10-15 minute intervals if needed, but try and build up your endurance at least once a week.  Weight bearing exercise is also recommended twice weekly.  Eat a healthy diet with lots of vegetables, fruits and fiber.  "Colorful" foods have a lot of vitamins (ie green vegetables, tomatoes, red peppers, etc).  Limit sweet tea, regular sodas and alcoholic beverages, all of which has a lot of calories and sugar.  Up to 1 alcoholic drink daily may be beneficial for women (unless trying to lose weight, watch sugars).  Drink a lot of water.  Calcium recommendations are 1200-1500 mg daily (1500 mg for postmenopausal women or women without ovaries), and vitamin D 1000 IU daily.  This should be obtained from diet and/or supplements (vitamins), and calcium should not be taken all at once, but in divided doses.  Monthly self breast exams and yearly mammograms for women over the age of 51 is recommended.  Sunscreen of at least SPF 30 should be used on all sun-exposed parts of the skin when outside between the hours of 10 am and 4 pm (not just when at beach or pool, but even with exercise, golf, tennis, and yard work!)  Use a sunscreen that says "broad spectrum" so it covers both UVA and UVB rays, and make sure to reapply every 1-2 hours.  Remember to change the batteries in your smoke detectors when changing your clock times in the spring and fall. Carbon monoxide detectors are recommended for your home.  Use your seat belt every time you are in a car, and please drive safely and not be distracted with cell phones and texting while driving.   I recommend getting the new shingles vaccine (Shingrix). You will need to check with your insurance to see if it  is covered, and if covered, schedule a nurse visit at our office when convenient.  It is a series of 2 injections, spaced 2 months apart.   Tdap (tetanus shot), pneumovax (pneumonia shot) and flu shots are recommended now.  Check with your insurance to verify what your cost may be (likely all covered), and then schedule a nurse visit at your convenience.  Feel free to get the flu shot elsewhere (ie pharmacy).  Check with your pulmonologist about a follow-up sleep study.  Call the [facility] to schedule your routine mammogram (last was in 01/2017). You also should have a bone density test.  [facility].  Please call and schedule both tests.  Please all your eye doctor and schedule your annual diabetic eye exam.  Remind them to send [place] and Dr. Elyse Hsu copies of the reports.

## 2019-04-11 ENCOUNTER — Other Ambulatory Visit (HOSPITAL_COMMUNITY)
Admission: RE | Admit: 2019-04-11 | Discharge: 2019-04-11 | Disposition: A | Discharge status: Home / Self Care | Payer: BC Managed Care – PPO | Source: Ambulatory Visit | Attending: Family Medicine | Admitting: Family Medicine

## 2019-04-11 ENCOUNTER — Other Ambulatory Visit: Payer: Self-pay

## 2019-04-11 ENCOUNTER — Encounter: Payer: Self-pay | Admitting: Family Medicine

## 2019-04-11 ENCOUNTER — Ambulatory Visit: Payer: BC Managed Care – PPO | Admitting: Family Medicine

## 2019-04-11 VITALS — BP 120/70 | HR 80 | Ht 65.0 in | Wt 172.4 lb

## 2019-04-11 DIAGNOSIS — Z7189 Other specified counseling: Secondary | ICD-10-CM

## 2019-04-11 DIAGNOSIS — E1122 Type 2 diabetes mellitus with diabetic chronic kidney disease: Secondary | ICD-10-CM

## 2019-04-11 DIAGNOSIS — E559 Vitamin D deficiency, unspecified: Secondary | ICD-10-CM

## 2019-04-11 DIAGNOSIS — N898 Other specified noninflammatory disorders of vagina: Secondary | ICD-10-CM | POA: Diagnosis not present

## 2019-04-11 DIAGNOSIS — I10 Essential (primary) hypertension: Secondary | ICD-10-CM

## 2019-04-11 DIAGNOSIS — Z7185 Encounter for immunization safety counseling: Secondary | ICD-10-CM

## 2019-04-11 DIAGNOSIS — N182 Chronic kidney disease, stage 2 (mild): Secondary | ICD-10-CM

## 2019-04-11 DIAGNOSIS — Z780 Asymptomatic menopausal state: Secondary | ICD-10-CM

## 2019-04-11 DIAGNOSIS — G4733 Obstructive sleep apnea (adult) (pediatric): Secondary | ICD-10-CM

## 2019-04-11 DIAGNOSIS — Z0000 Encounter for general adult medical examination without abnormal findings: Secondary | ICD-10-CM

## 2019-04-11 DIAGNOSIS — E7800 Pure hypercholesterolemia, unspecified: Secondary | ICD-10-CM | POA: Diagnosis not present

## 2019-04-11 DIAGNOSIS — Z9189 Other specified personal risk factors, not elsewhere classified: Secondary | ICD-10-CM

## 2019-04-11 DIAGNOSIS — Z23 Encounter for immunization: Secondary | ICD-10-CM | POA: Diagnosis not present

## 2019-04-11 DIAGNOSIS — Z7952 Long term (current) use of systemic steroids: Secondary | ICD-10-CM

## 2019-04-11 DIAGNOSIS — M941 Relapsing polychondritis: Secondary | ICD-10-CM

## 2019-04-11 LAB — POCT WET PREP (WET MOUNT): Trichomonas Wet Prep HPF POC: ABSENT

## 2019-04-11 LAB — POCT URINALYSIS DIP (PROADVANTAGE DEVICE)
Bilirubin, UA: NEGATIVE
Glucose, UA: NEGATIVE mg/dL
Ketones, POC UA: NEGATIVE mg/dL
Leukocytes, UA: NEGATIVE
Nitrite, UA: NEGATIVE
Protein Ur, POC: NEGATIVE mg/dL
Specific Gravity, Urine: 1.025
Urobilinogen, Ur: NEGATIVE
pH, UA: 6 (ref 5.0–8.0)

## 2019-04-17 DIAGNOSIS — E1165 Type 2 diabetes mellitus with hyperglycemia: Secondary | ICD-10-CM | POA: Diagnosis not present

## 2019-04-17 DIAGNOSIS — E559 Vitamin D deficiency, unspecified: Secondary | ICD-10-CM | POA: Diagnosis not present

## 2019-04-17 DIAGNOSIS — E7800 Pure hypercholesterolemia, unspecified: Secondary | ICD-10-CM | POA: Diagnosis not present

## 2019-04-19 ENCOUNTER — Other Ambulatory Visit: Payer: Self-pay | Admitting: Family Medicine

## 2019-04-19 DIAGNOSIS — Z1231 Encounter for screening mammogram for malignant neoplasm of breast: Secondary | ICD-10-CM

## 2019-04-19 DIAGNOSIS — Z7952 Long term (current) use of systemic steroids: Secondary | ICD-10-CM | POA: Diagnosis not present

## 2019-04-19 DIAGNOSIS — E7800 Pure hypercholesterolemia, unspecified: Secondary | ICD-10-CM | POA: Diagnosis not present

## 2019-04-19 DIAGNOSIS — Z79899 Other long term (current) drug therapy: Secondary | ICD-10-CM | POA: Diagnosis not present

## 2019-04-19 DIAGNOSIS — E1165 Type 2 diabetes mellitus with hyperglycemia: Secondary | ICD-10-CM | POA: Diagnosis not present

## 2019-04-22 LAB — CBC AND DIFFERENTIAL: Hemoglobin: 7 — AB (ref 12.0–16.0)

## 2019-04-22 LAB — HEMOGLOBIN A1C: Hemoglobin A1C: 7

## 2019-04-23 ENCOUNTER — Other Ambulatory Visit: Payer: Self-pay

## 2019-04-23 LAB — CYTOLOGY - PAP
Comment: NEGATIVE
Diagnosis: NEGATIVE
High risk HPV: NEGATIVE

## 2019-05-01 ENCOUNTER — Encounter: Payer: Self-pay | Admitting: Family Medicine

## 2019-05-03 ENCOUNTER — Other Ambulatory Visit: Payer: Self-pay | Admitting: Family Medicine

## 2019-05-03 DIAGNOSIS — E559 Vitamin D deficiency, unspecified: Secondary | ICD-10-CM

## 2019-05-03 NOTE — Telephone Encounter (Signed)
Are you okay filling this? Looks like you have done in the past.

## 2019-05-14 ENCOUNTER — Other Ambulatory Visit: Payer: Self-pay

## 2019-05-14 DIAGNOSIS — Z20822 Contact with and (suspected) exposure to covid-19: Secondary | ICD-10-CM

## 2019-05-15 LAB — NOVEL CORONAVIRUS, NAA: SARS-CoV-2, NAA: NOT DETECTED

## 2019-05-24 ENCOUNTER — Encounter: Payer: Self-pay | Admitting: Sports Medicine

## 2019-05-24 ENCOUNTER — Other Ambulatory Visit: Payer: Self-pay | Admitting: Sports Medicine

## 2019-05-24 ENCOUNTER — Other Ambulatory Visit: Payer: Self-pay

## 2019-05-24 ENCOUNTER — Ambulatory Visit
Admission: RE | Admit: 2019-05-24 | Discharge: 2019-05-24 | Disposition: A | Discharge status: Home / Self Care | Payer: BC Managed Care – PPO | Source: Ambulatory Visit | Attending: Family Medicine | Admitting: Family Medicine

## 2019-05-24 ENCOUNTER — Ambulatory Visit: Payer: BC Managed Care – PPO | Admitting: Sports Medicine

## 2019-05-24 DIAGNOSIS — M79674 Pain in right toe(s): Secondary | ICD-10-CM | POA: Diagnosis not present

## 2019-05-24 DIAGNOSIS — Z1231 Encounter for screening mammogram for malignant neoplasm of breast: Secondary | ICD-10-CM

## 2019-05-24 DIAGNOSIS — E119 Type 2 diabetes mellitus without complications: Secondary | ICD-10-CM

## 2019-05-24 DIAGNOSIS — L603 Nail dystrophy: Secondary | ICD-10-CM

## 2019-05-24 DIAGNOSIS — M79671 Pain in right foot: Secondary | ICD-10-CM

## 2019-05-24 DIAGNOSIS — Z79899 Other long term (current) drug therapy: Secondary | ICD-10-CM | POA: Diagnosis not present

## 2019-05-24 MED ORDER — NEOMYCIN-POLYMYXIN-HC 3.5-10000-1 OT SOLN: OTIC | 0 refills | Status: DC

## 2019-05-24 NOTE — Progress Notes (Signed)
Subjective: Michelle Mack is a 53 y.o. female patient with history of diabetes who presents to office today complaining of pain around right 2nd toenail after getting a pedicure had pain and swelling with redness on both sides of the toe slowly getting better with worse pain in shoes. No other issues noted.  FBS 91 A1c 7  Patient Active Problem List   Diagnosis Date Noted  . Unstable angina ([place]) 07/19/2018  . Chest wall pain 07/18/2018  . Polychondritis 07/18/2018  . Type 2 diabetes mellitus with hyperglycemia ([place]) 07/18/2018  . Hypercholesteremia 07/18/2018  . Atypical chest pain 07/18/2018  . 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 Fibroid, uterine 11/13/2015  . Mediastinal adenopathy 10/23/2015  . Collapse of left lung 10/23/2015  . Relapsing polychondritis 10/23/2015  . Type 2 diabetes mellitus, controlled, with renal complications ([place]) 99991111  . CKD (chronic kidney disease) stage 2, GFR 60-89 ml/min 11/06/2014  . Essential hypertension 05/22/2014  . Chronic polychondritis 05/02/2014  . Dysfunctional uterine bleeding 04/29/2014  . OSA (obstructive sleep apnea) 04/29/2014  . Other specified respiratory disorders 04/29/2014  . Microscopic hematuria 04/12/2014  . 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 Positive ANA (antinuclear antibody) 02/26/2014  . Vitamin D deficiency 07/31/2013  . Obesity (BMI 30-39.9)   . Impaired fasting glucose 05/24/2012  . Fibroid uterus 05/24/2012  . Infertility, female   . DUB (dysfunctional uterine bleeding)   . Chronic cough 05/25/2011  . GERD (gastroesophageal reflux disease) 05/25/2011  . Anemia 05/25/2011  . Essential hypertension, benign 04/05/2011   Current Outpatient Medications on File Prior to Visit  Medication Sig Dispense Refill  . amLODipine (NORVASC) 5 MG tablet TAKE 1 TABLET BY MOUTH EVERY DAY 90 tablet 0  . atorvastatin (LIPITOR) 20 MG tablet TAKE 1 TABLET BY MOUTH EVERY DAY 90 tablet 0  . Cholecalciferol (VITAMIN D) 50 MCG (2000  UT) tablet TAKE 1 TABLET BY MOUTH EVERY DAY 100 tablet 3  . Dulaglutide (TRULICITY) A999333 0000000 SOPN Inject 0.75 mg into the skin every Sunday.     . folic acid (FOLVITE) 1 MG tablet Take 1 mg by mouth daily.     . hydrochlorothiazide (HYDRODIURIL) 25 MG tablet Take 25 mg by mouth daily.  3  . KLOR-CON M20 20 MEQ tablet TAKE 1 TABLET BY MOUTH EVERY DAY 90 tablet 0  . metFORMIN (GLUCOPHAGE) 1000 MG tablet TAKE 1 TABLET BY MOUTH 2 TIMES DAILY WITH MEALS. TAKE 1 TABLET TWICE A DAY FOR DIABETES    . methotrexate 2.5 MG tablet Take 2.5 mg by mouth once a week.     . predniSONE (DELTASONE) 5 MG tablet Take 10 mg by mouth daily with breakfast.      No current facility-administered medications on file prior to visit.      Objective: General: Patient is awake, alert, and oriented x 3 and in no acute distress.  Integument: Skin is warm, dry and supple bilateral. Right 2nd toenail mildly elongated with mild swelling. No other signs of infection. No open lesions or preulcerative lesions present bilateral. Remaining integument unremarkable.  Vasculature:  Dorsalis Pedis pulse 1/4 bilateral. Posterior Tibial pulse  1/4 bilateral. Capillary fill time <3 sec 1-5 bilateral. Positive hair growth to the level of the digits.Temperature gradient within normal limits. No varicosities present bilateral. No edema present bilateral.   Neurology: The patient has intact sensation measured with a 5.07/10g Semmes Weinstein Monofilament at all pedal sites bilateral . Vibratory sensation diminished bilateral with tuning fork. No Babinski sign present bilateral.   Musculoskeletal:  Asymptomatic bunion and hammertoe (long 2nd toe) and pes planus pedal deformities noted bilateral. Muscular strength 5/5 in all lower extremity muscular groups bilateral without pain on range of motion . No tenderness with calf compression bilateral.  Assessment and Plan: Problem List Items Addressed This Visit    None    Visit Diagnoses     Pain around toenail, right foot    -  Primary   Nail dystrophy       Diabetes mellitus without complication ([place])         -Examined patient. -Discussed and educated patient on diabetic foot care, especially with  regards to the vascular, neurological and musculoskeletal systems.  -Patient declined nail avulsion procedure at this time at right 2nd toe -Mechanically debrided right 2nd toenail using sterile nail nipper and filed with dremel without incident  -Rx Corticosporin and recommend epsom soaks for at least 1 week -Answered all patient questions -Patient to return if not better after 1-2 weeks for nail procedure -Patient advised to call the office if any problems or questions arise in the meantime.  Landis Martins, DPM

## 2019-05-25 ENCOUNTER — Ambulatory Visit: Payer: BLUE CROSS/BLUE SHIELD

## 2019-05-25 ENCOUNTER — Other Ambulatory Visit: Payer: Self-pay

## 2019-05-25 DIAGNOSIS — Z20822 Contact with and (suspected) exposure to covid-19: Secondary | ICD-10-CM

## 2019-05-28 ENCOUNTER — Telehealth: Payer: Self-pay | Admitting: *Deleted

## 2019-05-28 LAB — NOVEL CORONAVIRUS, NAA: SARS-CoV-2, NAA: NOT DETECTED

## 2019-05-28 NOTE — Telephone Encounter (Signed)
V--last 2 A1c's were abstracted incorrectly, showing as hemoglobin, not A1c (the 7 and 7.9). Not sure if this can be fixed??  Review of chart shows normal Hgb in 10/2018. She should not be taking iron without knowing if she is truly iron deficient or having GI bleed. Her iron stores have been normal in the past (not indicating deficiency, last in 10/2017).  Certain foods/meds can make the stool dark (including iron, pepto bismol).  If she is worried about GI blood losses, she needs to be evaluated (blood tests done to determine if anemic, iron deficient, and rectal exam to test whether stool is heme positive or not.  Looks like she has a COVID test pending (need to ensure negative before seeing her in office)

## 2019-05-28 NOTE — Telephone Encounter (Signed)
Patient advised. She will call and schedule as soon as she gets her COVID test back.

## 2019-05-28 NOTE — Telephone Encounter (Signed)
Patient called and said her stools are darker than normal and she is really tired. Wants to know if she should take some iron-said in the past she was taking iron.

## 2019-06-02 ENCOUNTER — Other Ambulatory Visit: Payer: Self-pay | Admitting: Family Medicine

## 2019-06-02 DIAGNOSIS — I10 Essential (primary) hypertension: Secondary | ICD-10-CM

## 2019-06-02 DIAGNOSIS — E7800 Pure hypercholesterolemia, unspecified: Secondary | ICD-10-CM

## 2019-07-07 ENCOUNTER — Other Ambulatory Visit: Payer: Self-pay | Admitting: Family Medicine

## 2019-07-09 DIAGNOSIS — Z20822 Contact with and (suspected) exposure to covid-19: Secondary | ICD-10-CM | POA: Diagnosis not present

## 2019-07-09 DIAGNOSIS — K219 Gastro-esophageal reflux disease without esophagitis: Secondary | ICD-10-CM | POA: Diagnosis not present

## 2019-07-09 DIAGNOSIS — Z79899 Other long term (current) drug therapy: Secondary | ICD-10-CM | POA: Diagnosis not present

## 2019-07-09 DIAGNOSIS — Z20828 Contact with and (suspected) exposure to other viral communicable diseases: Secondary | ICD-10-CM | POA: Diagnosis not present

## 2019-07-09 DIAGNOSIS — G4733 Obstructive sleep apnea (adult) (pediatric): Secondary | ICD-10-CM | POA: Diagnosis not present

## 2019-07-09 DIAGNOSIS — M941 Relapsing polychondritis: Secondary | ICD-10-CM | POA: Diagnosis not present

## 2019-07-16 ENCOUNTER — Other Ambulatory Visit: Payer: BC Managed Care – PPO

## 2019-07-16 DIAGNOSIS — R942 Abnormal results of pulmonary function studies: Secondary | ICD-10-CM | POA: Diagnosis not present

## 2019-07-16 DIAGNOSIS — M941 Relapsing polychondritis: Secondary | ICD-10-CM | POA: Diagnosis not present

## 2019-07-17 ENCOUNTER — Other Ambulatory Visit: Payer: Self-pay

## 2019-07-17 ENCOUNTER — Ambulatory Visit
Admission: RE | Admit: 2019-07-17 | Discharge: 2019-07-17 | Disposition: A | Discharge status: Home / Self Care | Payer: BC Managed Care – PPO | Source: Ambulatory Visit | Attending: Family Medicine | Admitting: Family Medicine

## 2019-07-17 DIAGNOSIS — Z13820 Encounter for screening for osteoporosis: Secondary | ICD-10-CM | POA: Diagnosis not present

## 2019-07-17 DIAGNOSIS — Z9189 Other specified personal risk factors, not elsewhere classified: Secondary | ICD-10-CM

## 2019-07-17 DIAGNOSIS — Z780 Asymptomatic menopausal state: Secondary | ICD-10-CM | POA: Diagnosis not present

## 2019-07-17 DIAGNOSIS — M941 Relapsing polychondritis: Secondary | ICD-10-CM | POA: Diagnosis not present

## 2019-07-17 DIAGNOSIS — Z7952 Long term (current) use of systemic steroids: Secondary | ICD-10-CM

## 2019-07-18 DIAGNOSIS — E7800 Pure hypercholesterolemia, unspecified: Secondary | ICD-10-CM | POA: Diagnosis not present

## 2019-07-18 DIAGNOSIS — E1165 Type 2 diabetes mellitus with hyperglycemia: Secondary | ICD-10-CM | POA: Diagnosis not present

## 2019-07-18 DIAGNOSIS — M941 Relapsing polychondritis: Secondary | ICD-10-CM | POA: Diagnosis not present

## 2019-07-19 ENCOUNTER — Other Ambulatory Visit: Payer: BLUE CROSS/BLUE SHIELD

## 2019-07-19 ENCOUNTER — Telehealth: Payer: Self-pay | Admitting: *Deleted

## 2019-07-19 NOTE — Telephone Encounter (Signed)
Patient called and stated that you sent her for a DEXA because she had pain in her legs. She said you can literally hear her knees cracking when she walks. She is still in a lot of pain and wants to know what the next step is since the DEXA was normal.

## 2019-07-19 NOTE — Telephone Encounter (Signed)
Advise pt that bone density test was done to evaluate her bone for osteoporosis due to her prednisone use.  NOT for knee pain.  Her bone density is normal.  She had described fairly recent onset of knee pain at her visit in October. If it has been persistent or worsening, offer OV for re-evaluation.  We had recommend short-term rest after the pain had initially started, icing after exercise. The bone density test was not related to her knee complaints.

## 2019-07-19 NOTE — Telephone Encounter (Signed)
Patient advised, she will call if she decides she would like to come in for re-evaluation.

## 2019-07-20 DIAGNOSIS — E7800 Pure hypercholesterolemia, unspecified: Secondary | ICD-10-CM | POA: Diagnosis not present

## 2019-07-20 DIAGNOSIS — I10 Essential (primary) hypertension: Secondary | ICD-10-CM | POA: Diagnosis not present

## 2019-07-20 DIAGNOSIS — E1165 Type 2 diabetes mellitus with hyperglycemia: Secondary | ICD-10-CM | POA: Diagnosis not present

## 2019-07-20 DIAGNOSIS — Z7952 Long term (current) use of systemic steroids: Secondary | ICD-10-CM | POA: Diagnosis not present

## 2019-07-20 LAB — LIPID PANEL: LDL Cholesterol: 62

## 2019-07-20 LAB — HEMOGLOBIN A1C: Hemoglobin A1C: 7.3

## 2019-07-25 ENCOUNTER — Encounter: Payer: Self-pay | Admitting: *Deleted

## 2019-07-31 ENCOUNTER — Encounter: Payer: Self-pay | Admitting: Family Medicine

## 2019-07-31 ENCOUNTER — Ambulatory Visit: Payer: BC Managed Care – PPO | Attending: Internal Medicine

## 2019-07-31 DIAGNOSIS — Z20822 Contact with and (suspected) exposure to covid-19: Secondary | ICD-10-CM | POA: Diagnosis not present

## 2019-08-01 LAB — NOVEL CORONAVIRUS, NAA: SARS-CoV-2, NAA: NOT DETECTED

## 2019-08-06 ENCOUNTER — Ambulatory Visit: Payer: BC Managed Care – PPO | Admitting: Family Medicine

## 2019-09-06 ENCOUNTER — Other Ambulatory Visit: Payer: Self-pay | Admitting: Family Medicine

## 2019-09-14 ENCOUNTER — Ambulatory Visit: Payer: BC Managed Care – PPO

## 2019-09-17 ENCOUNTER — Ambulatory Visit: Payer: Self-pay

## 2019-09-17 NOTE — Telephone Encounter (Signed)
Patient called stating that she cancelled her COVID-19 test because she was informed that she should not travel for 2 weeks. Patient was scheduled for 1st dose 3/12 but canceled. She was informed that she will not be fully protected until two weeks after receiving her 2nd dose, but there are no travel restrictions except that she needs to have her 2nd dose of pfizer 21 days after 1 is recommended. She verbalized understanding and she was rescheduled for her first dose.  Reason for Disposition . General information question, no triage required and triager able to answer question  Answer Assessment - Initial Assessment Questions 1. REASON FOR CALL or QUESTION: "What is your reason for calling today?" or "How can I best help you?" or "What question do you have that I can help answer?"     Do I have to wait to travel if I get my COVID-19 vaccine.  Protocols used: INFORMATION ONLY CALL - NO TRIAGE-A-AH

## 2019-09-18 ENCOUNTER — Ambulatory Visit: Payer: BC Managed Care – PPO | Attending: Internal Medicine

## 2019-09-18 DIAGNOSIS — R079 Chest pain, unspecified: Secondary | ICD-10-CM | POA: Diagnosis not present

## 2019-09-18 DIAGNOSIS — Z23 Encounter for immunization: Secondary | ICD-10-CM

## 2019-09-18 NOTE — Progress Notes (Signed)
   Covid-19 Vaccination Clinic  Name:  Michelle Mack    MRN: [id number] DOB: 06-Nov-1965  09/18/2019  [name] was observed post Covid-19 immunization for 30 minutes based on pre-vaccination screening without incident. She was provided with Vaccine Information Sheet and instruction to access the V-Safe system.   [name] was instructed to call 911 with any severe reactions post vaccine: Marland Kitchen Difficulty breathing  . Swelling of face and throat  . A fast heartbeat  . A bad rash all over body  . Dizziness and weakness   Immunizations Administered    Name Date Dose VIS Date Route   Pfizer COVID-19 Vaccine 09/18/2019 11:20 AM 0.3 mL 06/15/2019 Intramuscular   Manufacturer: [place]   Lot: [id number]   [place]: [id number]

## 2019-09-19 ENCOUNTER — Other Ambulatory Visit: Payer: Self-pay | Admitting: Family Medicine

## 2019-09-19 DIAGNOSIS — E7800 Pure hypercholesterolemia, unspecified: Secondary | ICD-10-CM

## 2019-09-19 MED ORDER — ATORVASTATIN CALCIUM 20 MG PO TABS: 20.0000 mg | ORAL_TABLET | Freq: Every day | ORAL | 1 refills | Status: DC

## 2019-09-19 NOTE — Telephone Encounter (Signed)
Is this okay to refill? There is a scanned in lab with total and LDL from Jan 21.

## 2019-09-19 NOTE — Addendum Note (Signed)
Addended byRita Ohara on: 09/19/2019 04:32 PM   Modules accepted: Orders

## 2019-09-19 NOTE — Telephone Encounter (Signed)
I pended x 6 mos. Please verify the pharmacy [facility]?)

## 2019-09-20 ENCOUNTER — Ambulatory Visit: Payer: BC Managed Care – PPO

## 2019-09-25 ENCOUNTER — Encounter: Payer: Self-pay | Admitting: Family Medicine

## 2019-10-02 ENCOUNTER — Encounter: Payer: Self-pay | Admitting: Sports Medicine

## 2019-10-02 ENCOUNTER — Ambulatory Visit: Payer: BC Managed Care – PPO | Admitting: Sports Medicine

## 2019-10-02 ENCOUNTER — Other Ambulatory Visit: Payer: Self-pay

## 2019-10-02 VITALS — Temp 96.6°F

## 2019-10-02 DIAGNOSIS — M79674 Pain in right toe(s): Secondary | ICD-10-CM | POA: Diagnosis not present

## 2019-10-02 DIAGNOSIS — M21619 Bunion of unspecified foot: Secondary | ICD-10-CM | POA: Diagnosis not present

## 2019-10-02 DIAGNOSIS — M2040 Other hammer toe(s) (acquired), unspecified foot: Secondary | ICD-10-CM | POA: Diagnosis not present

## 2019-10-02 DIAGNOSIS — E119 Type 2 diabetes mellitus without complications: Secondary | ICD-10-CM | POA: Diagnosis not present

## 2019-10-02 NOTE — Progress Notes (Signed)
Subjective: Michelle Mack is a 54 y.o. female patient with history of diabetes who presents to office today complaining of pain at the tip of the right second toe reports that pedicures helped but if she trims the nail too sure or gets it trimmed too short the pain is worse especially when she is wearing a shoe reports that soaking with Epson salt helps until she has to wear a shoe and there is pressure at the toe pain comes back at the tip of the second toe.  Patient denies any redness warmth swelling drainage or any other acute symptoms at this time.  FBS not recorded A1c 6.6  Patient Active Problem List   Diagnosis Date Noted  . Unstable angina ([place]) 07/19/2018  . Chest wall pain 07/18/2018  . Polychondritis 07/18/2018  . Type 2 diabetes mellitus with hyperglycemia ([place]) 07/18/2018  . Hypercholesteremia 07/18/2018  . Atypical chest pain 07/18/2018  . 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 Fibroid, uterine 11/13/2015  . Mediastinal adenopathy 10/23/2015  . Collapse of left lung 10/23/2015  . Relapsing polychondritis 10/23/2015  . Type 2 diabetes mellitus, controlled, with renal complications ([place]) 99991111  . CKD (chronic kidney disease) stage 2, GFR 60-89 ml/min 11/06/2014  . Essential hypertension 05/22/2014  . Chronic polychondritis 05/02/2014  . Dysfunctional uterine bleeding 04/29/2014  . OSA (obstructive sleep apnea) 04/29/2014  . Other specified respiratory disorders 04/29/2014  . Microscopic hematuria 04/12/2014  . 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 Positive ANA (antinuclear antibody) 02/26/2014  . Vitamin D deficiency 07/31/2013  . Obesity (BMI 30-39.9)   . Impaired fasting glucose 05/24/2012  . Fibroid uterus 05/24/2012  . Infertility, female   . DUB (dysfunctional uterine bleeding)   . Chronic cough 05/25/2011  . GERD (gastroesophageal reflux disease) 05/25/2011  . Anemia 05/25/2011  . Essential hypertension, benign 04/05/2011   Current Outpatient Medications on  File Prior to Visit  Medication Sig Dispense Refill  . ACCU-CHEK GUIDE test strip     . amLODipine (NORVASC) 5 MG tablet TAKE 1 TABLET BY MOUTH EVERY DAY 90 tablet 0  . atorvastatin (LIPITOR) 20 MG tablet Take 1 tablet (20 mg total) by mouth daily. 90 tablet 1  . Cholecalciferol 25 MCG (1000 UT) capsule cholecalciferol (vitamin D3) 25 mcg (1,000 unit) capsule  TAKE 1 CAPSULE BY MOUTH EVERY DAY    . Dulaglutide (TRULICITY) A999333 0000000 SOPN Inject 0.75 mg into the skin every Sunday.     . ergocalciferol (VITAMIN D2) 1.25 MG (50000 UT) capsule ergocalciferol (vitamin D2) 1,250 mcg (50,000 unit) capsule    . folic acid (FOLVITE) 1 MG tablet Take by mouth.    . hydrochlorothiazide (HYDRODIURIL) 25 MG tablet Take 25 mg by mouth daily.  3  . KLOR-CON M20 20 MEQ tablet TAKE 1 TABLET BY MOUTH EVERY DAY 90 tablet 0  . lidocaine (XYLOCAINE) 2 % jelly lidocaine HCl 2 % mucosal jelly    . metFORMIN (GLUCOPHAGE) 1000 MG tablet TAKE 1 TABLET BY MOUTH 2 TIMES DAILY WITH MEALS. TAKE 1 TABLET TWICE A DAY FOR DIABETES    . methotrexate (RHEUMATREX) 2.5 MG tablet Take by mouth.    . neomycin-polymyxin-hydrocortisone (CORTISPORIN) OTIC solution Apply 2-3 drops to toenail after soaking with Epsom salt and warm water for at least 1 week 10 mL 0  . Niacin CR 1000 MG TBCR niacin ER 1,000 mg tablet,extended release 24 hr    . predniSONE (DELTASONE) 5 MG tablet Take by mouth.     No current facility-administered medications on file  prior to visit.     Objective: General: Patient is awake, alert, and oriented x 3 and in no acute distress.  Integument: Skin is warm, dry and supple bilateral. Right 2nd toenail mildly elongated with pain at the distal tip at area where the long toe was likely rubbing or touching in shoe, no other signs of infection. No open lesions or preulcerative lesions present bilateral. Remaining integument unremarkable.  Vasculature:  Dorsalis Pedis pulse 1/4 bilateral. Posterior Tibial pulse   1/4 bilateral. Capillary fill time <3 sec 1-5 bilateral. Positive hair growth to the level of the digits.Temperature gradient within normal limits. No varicosities present bilateral. No edema present bilateral.   Neurology: The patient has intact sensation measured with a 5.07/10g Semmes Weinstein Monofilament at all pedal sites bilateral . Vibratory sensation diminished bilateral with tuning fork. No Babinski sign present bilateral.   Musculoskeletal: Asymptomatic bunion and hammertoe (long 2nd toe) and pes planus pedal deformities noted bilateral. Muscular strength 5/5 in all lower extremity muscular groups bilateral without pain on range of motion . No tenderness with calf compression bilateral.  Assessment and Plan: Problem List Items Addressed This Visit    None    Visit Diagnoses    Pain around toenail, right foot    -  Primary   Bunion       Hammer toe, unspecified laterality       Diabetes mellitus without complication ([place])         -Examined patient. -Re-Discussed and educated patient on diabetic foot care, especially with  regards to the vascular, neurological and musculoskeletal systems.  -Patient declined nail trim or nail avulsion procedure at this time at right 2nd toe -Advised patient that because her second toe is really long it can rub in her shoe and can be adding to her symptoms and this is worsened by her bunion deformity advised patient to consider surgical correction for these deformities -Dispensed toe cap for patient to use when in shoes to prevent rubbing or irritation at the distal tuft of the second toe on the right -Answered all patient questions -Patient to return as needed or when she is ready for a surgical procedure -Patient advised to call the office if any problems or questions arise in the meantime.  Landis Martins, DPM

## 2019-10-09 ENCOUNTER — Ambulatory Visit: Payer: BC Managed Care – PPO | Attending: Internal Medicine

## 2019-10-09 DIAGNOSIS — Z23 Encounter for immunization: Secondary | ICD-10-CM

## 2019-10-09 NOTE — Progress Notes (Signed)
   Covid-19 Vaccination Clinic  Name:  Michelle Mack    MRN: [id number] DOB: 07-Apr-1966  10/09/2019  [name] was observed post Covid-19 immunization for 30 minutes based on pre-vaccination screening without incident. She was provided with Vaccine Information Sheet and instruction to access the V-Safe system.   [name] was instructed to call 911 with any severe reactions post vaccine: Marland Kitchen Difficulty breathing  . Swelling of face and throat  . A fast heartbeat  . A bad rash all over body  . Dizziness and weakness   Immunizations Administered    Name Date Dose VIS Date Route   Pfizer COVID-19 Vaccine 10/09/2019  8:17 AM 0.3 mL 06/15/2019 Intramuscular   Manufacturer: [employer]   Lot: [id number]   [place]: [id number]

## 2019-10-17 DIAGNOSIS — E7800 Pure hypercholesterolemia, unspecified: Secondary | ICD-10-CM | POA: Diagnosis not present

## 2019-10-17 DIAGNOSIS — E1165 Type 2 diabetes mellitus with hyperglycemia: Secondary | ICD-10-CM | POA: Diagnosis not present

## 2019-10-17 DIAGNOSIS — Z79899 Other long term (current) drug therapy: Secondary | ICD-10-CM | POA: Diagnosis not present

## 2019-10-17 LAB — LIPID PANEL: LDL Cholesterol: 48

## 2019-10-17 LAB — HEMOGLOBIN A1C: Hemoglobin A1C: 7.2

## 2019-10-18 DIAGNOSIS — I10 Essential (primary) hypertension: Secondary | ICD-10-CM | POA: Diagnosis not present

## 2019-10-18 DIAGNOSIS — E1165 Type 2 diabetes mellitus with hyperglycemia: Secondary | ICD-10-CM | POA: Diagnosis not present

## 2019-10-18 DIAGNOSIS — E7800 Pure hypercholesterolemia, unspecified: Secondary | ICD-10-CM | POA: Diagnosis not present

## 2019-10-18 DIAGNOSIS — Z7952 Long term (current) use of systemic steroids: Secondary | ICD-10-CM | POA: Diagnosis not present

## 2019-11-01 ENCOUNTER — Encounter: Payer: Self-pay | Admitting: Family Medicine

## 2019-11-01 ENCOUNTER — Other Ambulatory Visit: Payer: Self-pay

## 2019-11-01 ENCOUNTER — Ambulatory Visit (INDEPENDENT_AMBULATORY_CARE_PROVIDER_SITE_OTHER): Payer: BC Managed Care – PPO | Admitting: Family Medicine

## 2019-11-01 VITALS — BP 100/60 | HR 68 | Temp 97.6°F | Ht 65.0 in | Wt 163.4 lb

## 2019-11-01 DIAGNOSIS — N182 Chronic kidney disease, stage 2 (mild): Secondary | ICD-10-CM

## 2019-11-01 DIAGNOSIS — N898 Other specified noninflammatory disorders of vagina: Secondary | ICD-10-CM | POA: Diagnosis not present

## 2019-11-01 DIAGNOSIS — B373 Candidiasis of vulva and vagina: Secondary | ICD-10-CM

## 2019-11-01 DIAGNOSIS — R7989 Other specified abnormal findings of blood chemistry: Secondary | ICD-10-CM

## 2019-11-01 DIAGNOSIS — B3731 Acute candidiasis of vulva and vagina: Secondary | ICD-10-CM

## 2019-11-01 DIAGNOSIS — N949 Unspecified condition associated with female genital organs and menstrual cycle: Secondary | ICD-10-CM | POA: Diagnosis not present

## 2019-11-01 DIAGNOSIS — E1122 Type 2 diabetes mellitus with diabetic chronic kidney disease: Secondary | ICD-10-CM

## 2019-11-01 LAB — POCT WET PREP (WET MOUNT): Trichomonas Wet Prep HPF POC: ABSENT

## 2019-11-01 MED ORDER — TERCONAZOLE 0.4 % VA CREA: 1.0000 | TOPICAL_CREAM | Freq: Every day | VAGINAL | 0 refills | Status: DC

## 2019-11-01 NOTE — Patient Instructions (Signed)
Please try taking metamucil once daily to see if that makes the loose stools from the change in metformin dosing more tolerable.  Your discomfort may be related to wiping from the frequent/loose stools.   You can try using a flushable/wet wipe which may be less abrasive (pat vs wipe). You can try using Vagisil cream for irritation (not the yeast Vagisil). We are treating you for a mild yeast infection (noted under the microscope from the vaginal discharge)  If you continue to have discomfort, you may want to restart the vaginal cream a few times/week, as there was dryness to the vaginal tissue (from being menopausal).  Your hemoglobin was only borderline low, similar to what you have ranged in the past. Your pulmonologist checks this every 2 months.  If it continues to drift down, we may need to do more evaluation.

## 2019-11-01 NOTE — Progress Notes (Signed)
Chief Complaint  Patient presents with  . Vaginal Pain    dryness and burning, when she wipes it burns like she has a cut. Some redness and irritation. No discharge and no urinary symptoms. Iron came back low from her pulm doctor and she would like to discuss.    "my vagina seems dry", notices cuts.  Denies vaginal discharge. No recent sexual activity She notices redness. Had a bump possibly a hair bump, which drained 2 days ago, improving. Has slight itching on the inside.  No recent ABX.   Last A1c 7.2 earlier this month. LDL was 48 CBC 4/14--Hg 12.0, Hct 37, plt 299 Urine 2+ blood, SG 1.020  A1c 7.2. Dr. Elyse Hsu changed her metformin to ER 2gm all at once (because had been forgetiing the evening dose). B12 is ordered with next labs (along with A1c, c-met, lipids), due in July. She is having loose stools since changing the way she takes the metformin.  Denies bleeding. +bruising noted recently. Always cold. Menopausal.  12/2016 internal hemorrhoids o/w normal colonoscopy, Dr Collene Mares Denies hemorrhoidal bleeding. Has known microscopic blood in urine, chronic.  Denies gross hematuria.  PMH, PSH ,SH reviewed  Outpatient Encounter Medications as of 11/01/2019  Medication Sig  . ACCU-CHEK GUIDE test strip   . amLODipine (NORVASC) 5 MG tablet TAKE 1 TABLET BY MOUTH EVERY DAY  . atorvastatin (LIPITOR) 20 MG tablet Take 1 tablet (20 mg total) by mouth daily.  . Cholecalciferol 25 MCG (1000 UT) capsule cholecalciferol (vitamin D3) 25 mcg (1,000 unit) capsule  TAKE 1 CAPSULE BY MOUTH EVERY DAY  . folic acid (FOLVITE) 1 MG tablet Take by mouth.  . hydrochlorothiazide (HYDRODIURIL) 25 MG tablet Take 25 mg by mouth daily.  Marland Kitchen KLOR-CON M20 20 MEQ tablet TAKE 1 TABLET BY MOUTH EVERY DAY  . metFORMIN (GLUCOPHAGE-XR) 500 MG 24 hr tablet Take 4 tablets in AM once a day for diabetes  . methotrexate (RHEUMATREX) 2.5 MG tablet Take by mouth.  . predniSONE (DELTASONE) 5 MG tablet Take by mouth.   . [DISCONTINUED] ergocalciferol (VITAMIN D2) 1.25 MG (50000 UT) capsule ergocalciferol (vitamin D2) 1,250 mcg (50,000 unit) capsule  . terconazole (TERAZOL 7) 0.4 % vaginal cream Place 1 applicator vaginally at bedtime.  . [DISCONTINUED] Dulaglutide (TRULICITY) 7.12 RF/7.5OI SOPN Inject 0.75 mg into the skin every Sunday.   . [DISCONTINUED] lidocaine (XYLOCAINE) 2 % jelly lidocaine HCl 2 % mucosal jelly  . [DISCONTINUED] metFORMIN (GLUCOPHAGE) 1000 MG tablet TAKE 1 TABLET BY MOUTH 2 TIMES DAILY WITH MEALS. TAKE 1 TABLET TWICE A DAY FOR DIABETES  . [DISCONTINUED] neomycin-polymyxin-hydrocortisone (CORTISPORIN) OTIC solution Apply 2-3 drops to toenail after soaking with Epsom salt and warm water for at least 1 week  . [DISCONTINUED] Niacin CR 1000 MG TBCR niacin ER 1,000 mg tablet,extended release 24 hr   No facility-administered encounter medications on file as of 11/01/2019.   (NOT taking terazol 7 prior to today's visit)  Allergies reviewed.  ROS: no fever, chills, URI symptoms. No urinary complaints (burning only exteriorly).  Vaginal complaints per HPI.  Breathing stable.  No bleeding.  +bruising recently.  See HPI  PHYSICAL EXAM:  BP 100/60   Pulse 68   Temp 97.6 F (36.4 C) (Temporal)   Ht _0  (1.651 m)   Wt 163 lb 6.4 oz (74.1 kg)   LMP 10/09/2015   BMI 27.19 kg/m   Pleasant, well-appearing female in good spirits, in no distress HEENT: conjunctiva and sclera are clear, EOMI Neck: no lymphadenopathy  or mass Heart: regular rate and rhythm Lungs: clear bilaterally Abdomen: soft, nontender, no mass Back: no spinal or CVA tenderness GU: normal external genitalia, with some atrophic changes noted.  No lesions, erythema, fissures.  Small residual papule without erythema or drainage at R labia majora (area that recently drained). Speculum exam--small amount of fairly thin white discharge. No lesions or other abnormalities noted. Normal bimanual exam, no masses or  tenderness Extremities: no edema Psych: normal mood, affect, hygiene and grooming Neuro: alert and oriented, normal gait, gross strength  Wet prep:  No WBC's, bacteria, clue cells or trich. KOH: a few hyphae noted    ASSESSMENT/PLAN:  Yeast vaginitis - Plan: terconazole (TERAZOL 7) 0.4 % vaginal cream  Vaginal discomfort - partly due to yeast infection, contributed by atrophic vaginitis, and poss irritation from wiping. Rec Vagisil, and to restart topical estrogen if not improving  Vaginal itching - Plan: POCT Wet Prep ([place])  Abnormal CBC - reviewed all CBC's, trends. Only borderline low, similar to where she has been in past, with no bleeding. Cont monitor q2 mos per pulm  Type 2 diabetes mellitus with stage 2 chronic kidney disease, without long-term current use of insulin ([facility]) - A1c slightly above goal; improved compliance with metformin since taking at 1 time. Some loose stools; metamucil recommended   Hg 12.0, with range of 12-13 over the years.  No h/o bleeding or anything for concern. She will have B12 level done in July for Dr. Elyse Hsu (due to metformin use); MCV was not elevated on recent labs.  Discussed that I am comfortable with continued monitoring (done q2 mos by pulm), rather than performing additional workup at this time. If Hg drops, should check iron studies (in addition to the B12 ordered).  Terazol 7  Prescribed for yeast infection--pt states monistat ineffective, prefers cream to tablet.  After visit, she saw on AVS her BMI, and she had questions about this, and it was discussed. Recommended some weight loss. BMI 27 borderline/overweight.  All questions answered.

## 2019-11-12 ENCOUNTER — Encounter: Payer: Self-pay | Admitting: Family Medicine

## 2019-11-12 ENCOUNTER — Other Ambulatory Visit: Payer: Self-pay | Admitting: Family Medicine

## 2019-11-12 DIAGNOSIS — I10 Essential (primary) hypertension: Secondary | ICD-10-CM | POA: Diagnosis not present

## 2019-11-12 DIAGNOSIS — N182 Chronic kidney disease, stage 2 (mild): Secondary | ICD-10-CM | POA: Diagnosis not present

## 2019-11-12 DIAGNOSIS — R319 Hematuria, unspecified: Secondary | ICD-10-CM | POA: Diagnosis not present

## 2019-11-12 DIAGNOSIS — E559 Vitamin D deficiency, unspecified: Secondary | ICD-10-CM | POA: Diagnosis not present

## 2019-12-12 ENCOUNTER — Other Ambulatory Visit: Payer: Self-pay

## 2019-12-12 ENCOUNTER — Other Ambulatory Visit: Payer: BC Managed Care – PPO

## 2019-12-12 DIAGNOSIS — Z111 Encounter for screening for respiratory tuberculosis: Secondary | ICD-10-CM | POA: Diagnosis not present

## 2019-12-14 LAB — QUANTIFERON-TB GOLD PLUS
QuantiFERON Mitogen Value: 3.11 IU/mL
QuantiFERON Nil Value: 0 IU/mL
QuantiFERON TB1 Ag Value: 0.03 IU/mL
QuantiFERON TB2 Ag Value: 0.03 IU/mL
QuantiFERON-TB Gold Plus: NEGATIVE

## 2019-12-17 DIAGNOSIS — D508 Other iron deficiency anemias: Secondary | ICD-10-CM | POA: Diagnosis not present

## 2019-12-17 DIAGNOSIS — Z79899 Other long term (current) drug therapy: Secondary | ICD-10-CM | POA: Diagnosis not present

## 2019-12-18 DIAGNOSIS — I10 Essential (primary) hypertension: Secondary | ICD-10-CM | POA: Diagnosis not present

## 2019-12-18 DIAGNOSIS — G4733 Obstructive sleep apnea (adult) (pediatric): Secondary | ICD-10-CM | POA: Diagnosis not present

## 2019-12-18 DIAGNOSIS — J398 Other specified diseases of upper respiratory tract: Secondary | ICD-10-CM | POA: Diagnosis not present

## 2019-12-18 DIAGNOSIS — Z79899 Other long term (current) drug therapy: Secondary | ICD-10-CM | POA: Diagnosis not present

## 2019-12-26 ENCOUNTER — Other Ambulatory Visit: Payer: Self-pay | Admitting: Family Medicine

## 2019-12-26 DIAGNOSIS — I10 Essential (primary) hypertension: Secondary | ICD-10-CM

## 2020-01-12 IMAGING — CR DG HIP (WITH OR WITHOUT PELVIS) 3-4V BILAT
3 series · 3 of 3 positions shown · non-contrast
Comparison: None.

CLINICAL DATA: Acute bilateral hip pain.

EXAM:
DG HIP (WITH OR WITHOUT PELVIS) 3-4V BILAT

[t pelvis a.p.]
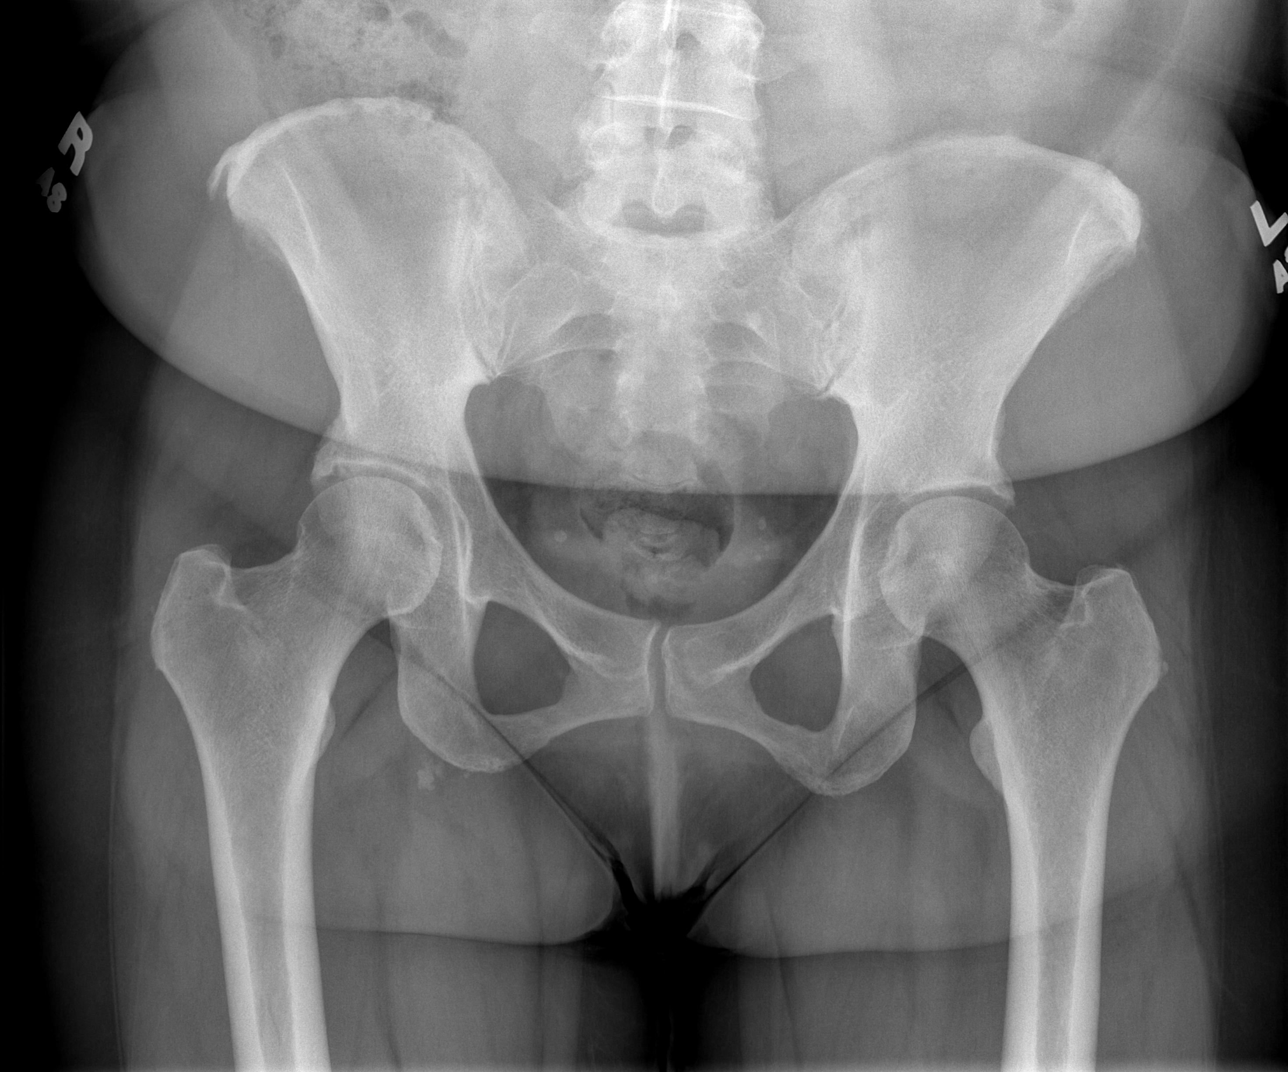

[t hip frog leg left]
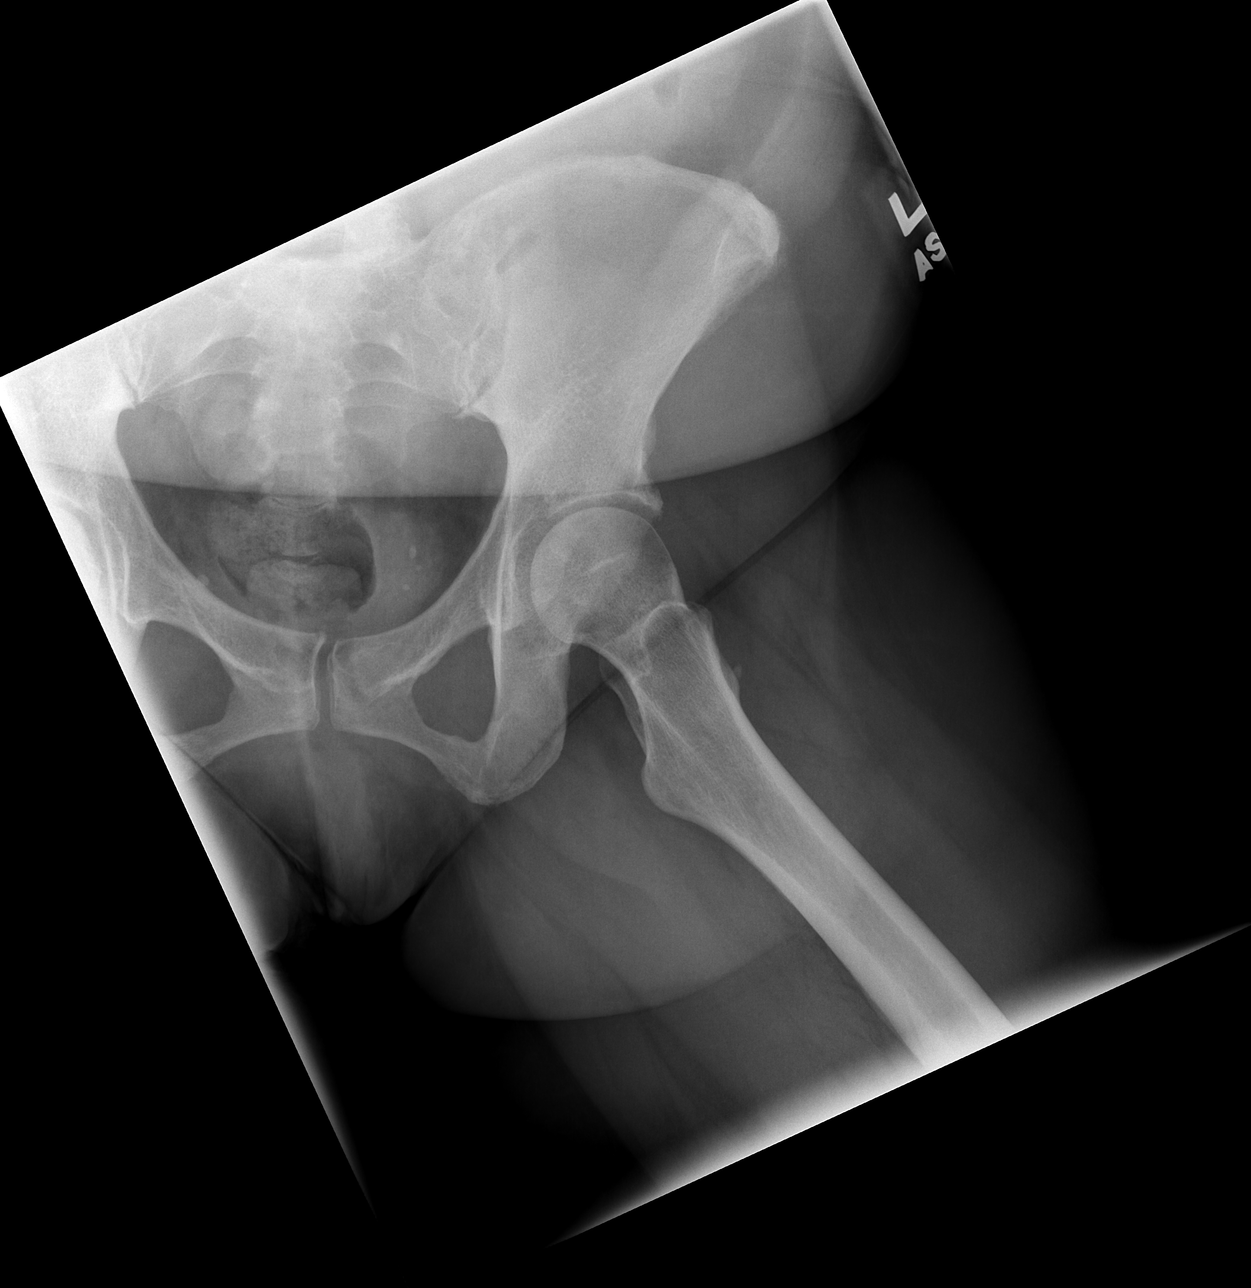

[t hip frog leg right]
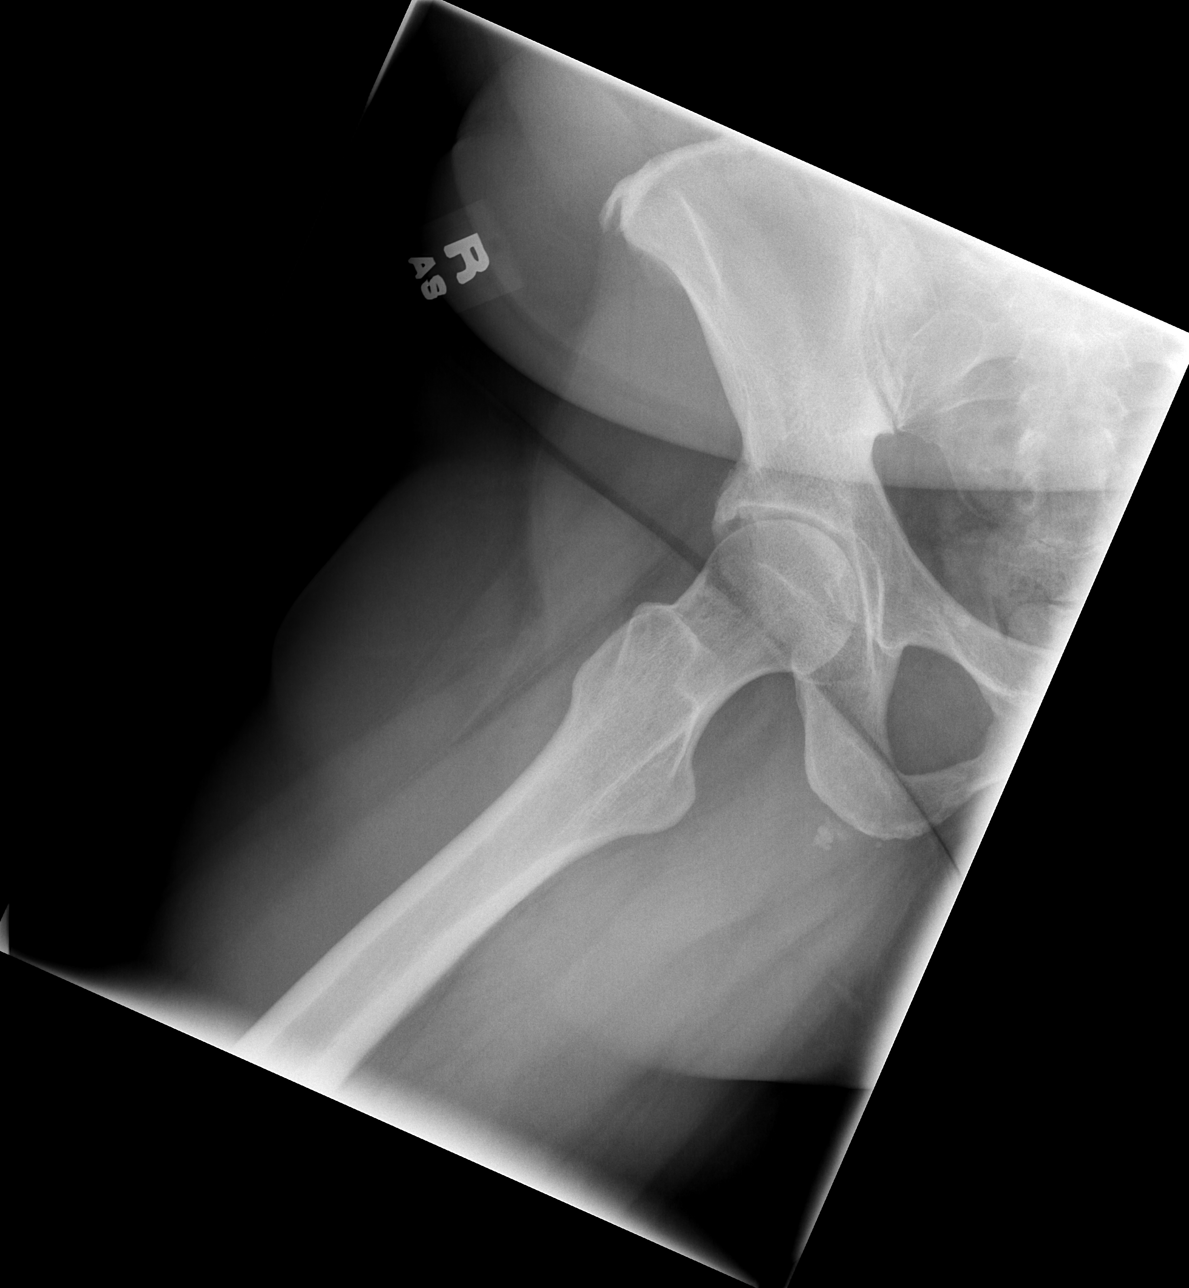

[3 of 3 positions shown; findings below may reference images not displayed]

FINDINGS: There is no evidence of hip fracture or dislocation. There is no
evidence of arthropathy or other focal bone abnormality.
IMPRESSION: Normal bilateral hips.

## 2020-01-18 ENCOUNTER — Other Ambulatory Visit: Payer: Self-pay

## 2020-01-18 ENCOUNTER — Encounter (HOSPITAL_BASED_OUTPATIENT_CLINIC_OR_DEPARTMENT_OTHER): Payer: Self-pay | Admitting: *Deleted

## 2020-01-18 ENCOUNTER — Emergency Department (HOSPITAL_BASED_OUTPATIENT_CLINIC_OR_DEPARTMENT_OTHER): Payer: BC Managed Care – PPO

## 2020-01-18 DIAGNOSIS — R079 Chest pain, unspecified: Secondary | ICD-10-CM | POA: Diagnosis not present

## 2020-01-18 DIAGNOSIS — I10 Essential (primary) hypertension: Secondary | ICD-10-CM | POA: Diagnosis not present

## 2020-01-18 DIAGNOSIS — Z03818 Encounter for observation for suspected exposure to other biological agents ruled out: Secondary | ICD-10-CM | POA: Diagnosis not present

## 2020-01-18 DIAGNOSIS — Z7984 Long term (current) use of oral hypoglycemic drugs: Secondary | ICD-10-CM | POA: Insufficient documentation

## 2020-01-18 DIAGNOSIS — E119 Type 2 diabetes mellitus without complications: Secondary | ICD-10-CM | POA: Diagnosis not present

## 2020-01-18 DIAGNOSIS — M25512 Pain in left shoulder: Secondary | ICD-10-CM | POA: Diagnosis not present

## 2020-01-18 DIAGNOSIS — Z881 Allergy status to other antibiotic agents status: Secondary | ICD-10-CM | POA: Insufficient documentation

## 2020-01-18 DIAGNOSIS — Z79899 Other long term (current) drug therapy: Secondary | ICD-10-CM | POA: Diagnosis not present

## 2020-01-18 DIAGNOSIS — Z888 Allergy status to other drugs, medicaments and biological substances status: Secondary | ICD-10-CM | POA: Insufficient documentation

## 2020-01-18 DIAGNOSIS — M25511 Pain in right shoulder: Secondary | ICD-10-CM | POA: Insufficient documentation

## 2020-01-18 DIAGNOSIS — Z8249 Family history of ischemic heart disease and other diseases of the circulatory system: Secondary | ICD-10-CM | POA: Insufficient documentation

## 2020-01-18 DIAGNOSIS — Z880 Allergy status to penicillin: Secondary | ICD-10-CM | POA: Insufficient documentation

## 2020-01-18 DIAGNOSIS — Z20822 Contact with and (suspected) exposure to covid-19: Secondary | ICD-10-CM | POA: Diagnosis not present

## 2020-01-18 LAB — CBC WITH DIFFERENTIAL/PLATELET
Abs Immature Granulocytes: 0.07 10*3/uL (ref 0.00–0.07)
Basophils Absolute: 0 10*3/uL (ref 0.0–0.1)
Basophils Relative: 0 %
Eosinophils Absolute: 0.1 10*3/uL (ref 0.0–0.5)
Eosinophils Relative: 0 %
HCT: 39.6 % (ref 36.0–46.0)
Hemoglobin: 12.5 g/dL (ref 12.0–15.0)
Immature Granulocytes: 1 %
Lymphocytes Relative: 18 %
Lymphs Abs: 2.6 10*3/uL (ref 0.7–4.0)
MCH: 28 pg (ref 26.0–34.0)
MCHC: 31.6 g/dL (ref 30.0–36.0)
MCV: 88.8 fL (ref 80.0–100.0)
Monocytes Absolute: 1.4 10*3/uL — ABNORMAL HIGH (ref 0.1–1.0)
Monocytes Relative: 9 %
Neutro Abs: 10.4 10*3/uL — ABNORMAL HIGH (ref 1.7–7.7)
Neutrophils Relative %: 72 %
Platelets: 312 10*3/uL (ref 150–400)
RBC: 4.46 MIL/uL (ref 3.87–5.11)
RDW: 12.9 % (ref 11.5–15.5)
WBC: 14.4 10*3/uL — ABNORMAL HIGH (ref 4.0–10.5)
nRBC: 0 % (ref 0.0–0.2)

## 2020-01-18 LAB — COMPREHENSIVE METABOLIC PANEL
ALT: 24 U/L (ref 0–44)
AST: 19 U/L (ref 15–41)
Albumin: 3.9 g/dL (ref 3.5–5.0)
Alkaline Phosphatase: 86 U/L (ref 38–126)
Anion gap: 12 (ref 5–15)
BUN: 16 mg/dL (ref 6–20)
CO2: 29 mmol/L (ref 22–32)
Calcium: 9 mg/dL (ref 8.9–10.3)
Chloride: 99 mmol/L (ref 98–111)
Creatinine, Ser: 0.9 mg/dL (ref 0.44–1.00)
GFR calc Af Amer: >60 mL/min (ref >60)
GFR calc non Af Amer: >60 mL/min (ref >60)
Glucose, Bld: 157 mg/dL — ABNORMAL HIGH (ref 70–99)
Potassium: 3.2 mmol/L — ABNORMAL LOW (ref 3.5–5.1)
Sodium: 140 mmol/L (ref 135–145)
Total Bilirubin: 1.5 mg/dL — ABNORMAL HIGH (ref 0.3–1.2)
Total Protein: 7.5 g/dL (ref 6.5–8.1)

## 2020-01-18 LAB — TROPONIN I (HIGH SENSITIVITY): Troponin I (High Sensitivity): 9 ng/L (ref <18)

## 2020-01-18 NOTE — ED Triage Notes (Signed)
Pt c/o left sided chest pain which radiates down left arm x 2 episodes today

## 2020-01-18 NOTE — ED Notes (Signed)
Patient transported to X-ray 

## 2020-01-19 ENCOUNTER — Emergency Department (HOSPITAL_BASED_OUTPATIENT_CLINIC_OR_DEPARTMENT_OTHER)
Admission: EM | Admit: 2020-01-19 | Discharge: 2020-01-19 | Disposition: A | Discharge status: Home / Self Care | Payer: BC Managed Care – PPO | Attending: Emergency Medicine | Admitting: Emergency Medicine

## 2020-01-19 DIAGNOSIS — M25511 Pain in right shoulder: Secondary | ICD-10-CM

## 2020-01-19 MED ORDER — POTASSIUM CHLORIDE CRYS ER 20 MEQ PO TBCR: 40.0000 meq | EXTENDED_RELEASE_TABLET | Freq: Once | ORAL | Status: DC

## 2020-01-19 MED ORDER — NAPROXEN 250 MG PO TABS: 500.0000 mg | ORAL_TABLET | Freq: Once | ORAL | Status: DC

## 2020-01-19 MED ORDER — NAPROXEN 375 MG PO TABS: ORAL_TABLET | ORAL | 0 refills | Status: DC

## 2020-01-19 NOTE — ED Provider Notes (Signed)
[facility] Provider Note: Michelle Spurling, MD, FACEP  CSN: [id number] MRN: [id number] ARRIVAL: 01/18/20 at 2202 ROOM: [place]  Shoulder Pain   HISTORY OF PRESENT ILLNESS  01/19/20 2:36 AM Michelle Mack is a 54 y.o. female who awakened yesterday morning with pain in her left upper arm.  The pain is sharp in nature and worse with movement of the left shoulder.  Throughout the day yesterday the pain began moving across her upper chest and into the right upper arm.  Her right upper arm hurts when she moves her right shoulder.  She denies any injury to her shoulders.  She took Tylenol without relief.  She rates the pain as a 7 out of 10.  There is no associated shortness of breath, nausea or diaphoresis.   Past Medical History:  Diagnosis Date  . Anemia    on iron  . Back pain    tx with ibuprofen 800mg   . Bronchitis    treated recently by abx  . Cough    non-productive  . DUB (dysfunctional uterine bleeding)   . Fibroid uterus    GYN--Dr. Rivard  . GERD (gastroesophageal reflux disease)   . Hypertension   . Infertility, female   . Polychondritis   . Recurrent upper respiratory infection (URI)    in October - tx with abx  . Type II or unspecified type diabetes mellitus without mention of complication, not stated as uncontrolled    diagnosed by Dr. Berdine Addison    Past Surgical History:  Procedure Laterality Date  . BRONCHOSCOPY  01/2014   at [facility] expiratory collapse of trachea and focal narrowing in L mainstem bronchus and LUL  . ENDOMETRIAL VAPORIZATION W/ VERSAPOINT    . LEFT HEART CATH AND CORONARY ANGIOGRAPHY N/A 07/19/2018   Procedure: LEFT HEART CATH AND CORONARY ANGIOGRAPHY;  Surgeon: Nigel Mormon, MD;  Location: [facility];  Service: Cardiovascular;  Laterality: N/A;  . MYOMECTOMY  2002    Family History  Problem Relation Age of Onset  . Hypertension Mother   . Cancer Mother        pancreatic cancer  .  Diabetes Mother   . Hypertension Father   . Diabetes Father   . Cancer Father        prostate cancer  . Hypertension Sister   . Thyroid nodules Sister   . Diabetes Maternal Grandmother   . Breast cancer Maternal Grandmother   . Heart disease Maternal Grandmother   . Heart disease Paternal Grandmother     Social History   Tobacco Use  . Smoking status: Never Smoker  . Smokeless tobacco: Never Used  Vaping Use  . Vaping Use: Never used  Substance Use Topics  . Alcohol use: Yes    Comment: 0-1 drink/week  . Drug use: No    Prior to Admission medications   Medication Sig Start Date End Date Taking? Authorizing Provider  ACCU-CHEK GUIDE test strip  07/08/19   [provider]  amLODipine (NORVASC) 5 MG tablet TAKE 1 TABLET BY MOUTH EVERY DAY 12/26/19   [name], MD  atorvastatin (LIPITOR) 20 MG tablet Take 1 tablet (20 mg total) by mouth daily. 09/19/19   [name], MD  Cholecalciferol 25 MCG (1000 UT) capsule cholecalciferol (vitamin D3) 25 mcg (1,000 unit) capsule  TAKE 1 CAPSULE BY MOUTH EVERY DAY    [provider]  folic acid (FOLVITE) 1 MG tablet Take by mouth. 07/18/19  [provider]  hydrochlorothiazide (HYDRODIURIL) 25 MG tablet Take 25 mg by mouth daily. 08/01/15   [provider]  KLOR-CON M20 20 MEQ tablet TAKE 1 TABLET BY MOUTH EVERY DAY 11/12/19   [name], MD  metFORMIN (GLUCOPHAGE-XR) 500 MG 24 hr tablet Take 4 tablets in AM once a day for diabetes 10/18/19   [provider]  methotrexate (RHEUMATREX) 2.5 MG tablet Take by mouth. 07/18/19   [provider]  naproxen (NAPROSYN) 375 MG tablet Take 1 tablet twice daily as needed for shoulder pain. 01/19/20   Kala Gassmann, MD    Allergies Ampicillin, Penicillins, Cephalosporins, Nitrofuran derivatives, Sulfamethoxazole-trimethoprim, Sulfur, and Mobic [meloxicam]   REVIEW OF SYSTEMS  Negative except as noted here or in the History of Present Illness.   PHYSICAL  EXAMINATION  Initial Vital Signs Blood pressure 120/83, pulse 78, temperature 98.7 F (37.1 C), temperature source Oral, resp. rate (!) 21, height 5\' 4"  (1.626 m), weight 72.6 kg, last menstrual period 10/09/2015, SpO2 100 %.  Examination General: Well-developed, well-nourished female in no acute distress; appearance consistent with age of record HENT: normocephalic; atraumatic Eyes: pupils equal, round and reactive to light; extraocular muscles intact Neck: supple; neck movement does not reproduce upper extremity pain Heart: regular rate and rhythm Lungs: clear to auscultation bilaterally Chest: Nontender Abdomen: soft; nondistended; nontender; bowel sounds present Extremities: No deformity; pulses normal; pain on movement of left or right shoulder without upper arm tenderness Neurologic: Awake, alert and oriented; motor function intact in all extremities and symmetric; no facial droop Skin: Warm and dry Psychiatric: Normal mood and affect   RESULTS  Summary of this visit's results, reviewed and interpreted by myself:   EKG Interpretation  Date/Time:  Friday January 18 2020 22:07:25 EDT Ventricular Rate:  90 PR Interval:  136 QRS Duration: 82 QT Interval:  350 QTC Calculation: 428 R Axis:   22 Text Interpretation: Normal sinus rhythm Nonspecific ST abnormality Abnormal ECG Rate is faster Confirmed by Tymere Depuy, Jenny Reichmann [phone]) on 01/19/2020 2:37:31 AM      Laboratory Studies: Results for orders placed or performed during the hospital encounter of 01/19/20 (from the past 24 hour(s))  CBC with Differential     Status: Abnormal   Collection Time: 01/18/20 10:15 PM  Result Value Ref Range   WBC 14.4 (H) 4.0 - 10.5 K/uL   RBC 4.46 3.87 - 5.11 MIL/uL   Hemoglobin 12.5 12.0 - 15.0 g/dL   HCT 39.6 36 - 46 %   MCV 88.8 80.0 - 100.0 fL   MCH 28.0 26.0 - 34.0 pg   MCHC 31.6 30.0 - 36.0 g/dL   RDW 12.9 11.5 - 15.5 %   Platelets 312 150 - 400 K/uL   nRBC 0.0 0.0 - 0.2 %   Neutrophils  Relative % 72 %   Neutro Abs 10.4 (H) 1.7 - 7.7 K/uL   Lymphocytes Relative 18 %   Lymphs Abs 2.6 0.7 - 4.0 K/uL   Monocytes Relative 9 %   Monocytes Absolute 1.4 (H) 0 - 1 K/uL   Eosinophils Relative 0 %   Eosinophils Absolute 0.1 0 - 0 K/uL   Basophils Relative 0 %   Basophils Absolute 0.0 0 - 0 K/uL   Immature Granulocytes 1 %   Abs Immature Granulocytes 0.07 0.00 - 0.07 K/uL  Comprehensive metabolic panel     Status: Abnormal   Collection Time: 01/18/20 10:15 PM  Result Value Ref Range   Sodium 140 135 - 145 mmol/L  Potassium 3.2 (L) 3.5 - 5.1 mmol/L   Chloride 99 98 - 111 mmol/L   CO2 29 22 - 32 mmol/L   Glucose, Bld 157 (H) 70 - 99 mg/dL   BUN 16 6 - 20 mg/dL   Creatinine, Ser 0.90 0.44 - 1.00 mg/dL   Calcium 9.0 8.9 - 10.3 mg/dL   Total Protein 7.5 6.5 - 8.1 g/dL   Albumin 3.9 3.5 - 5.0 g/dL   AST 19 15 - 41 U/L   ALT 24 0 - 44 U/L   Alkaline Phosphatase 86 38 - 126 U/L   Total Bilirubin 1.5 (H) 0.3 - 1.2 mg/dL   GFR calc non Af Amer >60 >60 mL/min   GFR calc Af Amer >60 >60 mL/min   Anion gap 12 5 - 15  Troponin I (High Sensitivity)     Status: None   Collection Time: 01/18/20 10:15 PM  Result Value Ref Range   Troponin I (High Sensitivity) 9 <18 ng/L   Imaging Studies: DG Chest 2 View  Result Date: 01/18/2020 CLINICAL DATA:  Chest pain EXAM: CHEST - 2 VIEW COMPARISON:  08/26/2014 FINDINGS: The heart size and mediastinal contours are within normal limits. Both lungs are clear. The visualized skeletal structures are unremarkable. IMPRESSION: No active cardiopulmonary disease. Electronically Signed   By: Fidela Salisbury MD   On: 01/18/2020 22:43    ED COURSE and MDM  Nursing notes, initial and subsequent vitals signs, including pulse oximetry, reviewed and interpreted by myself.  Vitals:   01/18/20 2207 01/18/20 2210 01/19/20 0109 01/19/20 0325  BP:  123/84 120/83 135/84  Pulse:  87 78 89  Resp:  18 (!) 21 20  Temp:  98.7 F (37.1 C)    TempSrc:  Oral      SpO2:  100% 100% 100%  Weight: 72.6 kg     Height: 5\' 4"  (1.626 m)      Medications  naproxen (NAPROSYN) tablet 500 mg (has no administration in time range)  potassium chloride SA (KLOR-CON) CR tablet 40 mEq (has no administration in time range)   3:24 AM The patient's pain appears musculoskeletal.  Patient states she is able to take Aleve so she should be able to take prescription naproxen.   PROCEDURES  Procedures   ED DIAGNOSES     ICD-10-CM   1. Acute pain of both shoulders  M25.511    M25.512        Kila Godina, Jenny Reichmann, MD 01/19/20 [phone]

## 2020-01-21 ENCOUNTER — Other Ambulatory Visit: Payer: BC Managed Care – PPO

## 2020-02-08 DIAGNOSIS — Z03818 Encounter for observation for suspected exposure to other biological agents ruled out: Secondary | ICD-10-CM | POA: Diagnosis not present

## 2020-02-08 DIAGNOSIS — Z20822 Contact with and (suspected) exposure to covid-19: Secondary | ICD-10-CM | POA: Diagnosis not present

## 2020-03-05 DIAGNOSIS — M1712 Unilateral primary osteoarthritis, left knee: Secondary | ICD-10-CM | POA: Diagnosis not present

## 2020-03-19 ENCOUNTER — Other Ambulatory Visit: Payer: Self-pay | Admitting: Family Medicine

## 2020-03-19 DIAGNOSIS — I10 Essential (primary) hypertension: Secondary | ICD-10-CM

## 2020-03-25 ENCOUNTER — Other Ambulatory Visit: Payer: Self-pay | Admitting: Sports Medicine

## 2020-03-25 DIAGNOSIS — M1712 Unilateral primary osteoarthritis, left knee: Secondary | ICD-10-CM | POA: Diagnosis not present

## 2020-03-25 DIAGNOSIS — M25562 Pain in left knee: Secondary | ICD-10-CM

## 2020-04-01 DIAGNOSIS — M1712 Unilateral primary osteoarthritis, left knee: Secondary | ICD-10-CM | POA: Diagnosis not present

## 2020-04-02 ENCOUNTER — Other Ambulatory Visit: Payer: Self-pay | Admitting: Family Medicine

## 2020-04-02 ENCOUNTER — Encounter: Payer: Self-pay | Admitting: Family Medicine

## 2020-04-02 DIAGNOSIS — M1710 Unilateral primary osteoarthritis, unspecified knee: Secondary | ICD-10-CM

## 2020-04-02 DIAGNOSIS — M179 Osteoarthritis of knee, unspecified: Secondary | ICD-10-CM

## 2020-04-02 DIAGNOSIS — E7800 Pure hypercholesterolemia, unspecified: Secondary | ICD-10-CM

## 2020-04-02 DIAGNOSIS — E559 Vitamin D deficiency, unspecified: Secondary | ICD-10-CM

## 2020-04-02 HISTORY — DX: Unilateral primary osteoarthritis, unspecified knee: M17.10

## 2020-04-02 HISTORY — DX: Osteoarthritis of knee, unspecified: M17.9

## 2020-04-08 DIAGNOSIS — M1712 Unilateral primary osteoarthritis, left knee: Secondary | ICD-10-CM | POA: Diagnosis not present

## 2020-04-09 DIAGNOSIS — Z79899 Other long term (current) drug therapy: Secondary | ICD-10-CM | POA: Diagnosis not present

## 2020-04-14 ENCOUNTER — Other Ambulatory Visit: Payer: Self-pay

## 2020-04-14 ENCOUNTER — Ambulatory Visit: Payer: BC Managed Care – PPO | Admitting: Family Medicine

## 2020-04-14 ENCOUNTER — Encounter: Payer: Self-pay | Admitting: Family Medicine

## 2020-04-14 VITALS — BP 118/68 | HR 80 | Temp 98.3°F | Ht 65.0 in | Wt 164.6 lb

## 2020-04-14 DIAGNOSIS — L02411 Cutaneous abscess of right axilla: Secondary | ICD-10-CM | POA: Diagnosis not present

## 2020-04-14 MED ORDER — DOXYCYCLINE HYCLATE 100 MG PO TABS: 100.0000 mg | ORAL_TABLET | Freq: Two times a day (BID) | ORAL | 0 refills | Status: DC

## 2020-04-14 NOTE — Progress Notes (Signed)
Chief Complaint  Patient presents with  . Cyst    under right arm, red and painful but not draining. Has been feeling more tired than usual.   . Flu Vaccine    declined.   First noted a bump in her right axilla 2 weeks ago.  It was painful.  Since then it has gotten red. Some days it is bigger than others. No longer painful. She denies any drainage. No h/o cysts in axilla; though has had in the groin (that drained with soaks)  No fever or chills  She reports some increased fatigue since noticing the lump.  Sugars have been a little higher than usual as well.   PMH, PSH, SH reviewed  Outpatient Encounter Medications as of 04/14/2020  Medication Sig Note  . ACCU-CHEK GUIDE test strip    . amLODipine (NORVASC) 5 MG tablet TAKE 1 TABLET BY MOUTH EVERY DAY   . atorvastatin (LIPITOR) 20 MG tablet TAKE 1 TABLET BY MOUTH EVERY DAY   . Cholecalciferol 25 MCG (1000 UT) capsule cholecalciferol (vitamin D3) 25 mcg (1,000 unit) capsule  TAKE 1 CAPSULE BY MOUTH EVERY DAY   . folic acid (FOLVITE) 1 MG tablet Take by mouth.   . hydrochlorothiazide (HYDRODIURIL) 25 MG tablet Take 25 mg by mouth daily.   Marland Kitchen KLOR-CON M20 20 MEQ tablet TAKE 1 TABLET BY MOUTH EVERY DAY   . metFORMIN (GLUCOPHAGE-XR) 500 MG 24 hr tablet Take 4 tablets in AM once a day for diabetes 04/14/2020: Takes BID  . methotrexate (RHEUMATREX) 2.5 MG tablet Take by mouth.   . predniSONE (DELTASONE) 5 MG tablet Take 5 mg by mouth 2 (two) times daily. 04/14/2020: Takes 10mg  qam  . doxycycline (VIBRA-TABS) 100 MG tablet Take 1 tablet (100 mg total) by mouth 2 (two) times daily.   . [DISCONTINUED] Cholecalciferol (VITAMIN D3) 50 MCG (2000 UT) TABS TAKE 1 TABLET BY MOUTH EVERY DAY   . [DISCONTINUED] naproxen (NAPROSYN) 375 MG tablet Take 1 tablet twice daily as needed for shoulder pain.   . [DISCONTINUED] VALIUM 5 MG tablet Take by mouth.    No facility-administered encounter medications on file as of 04/14/2020.   Allergies  reviewed  ROS: no fever, chills. +slightly increased fatigue in the last 2 weeks.No allergy or URI symptoms. No headaches, shortness of breath. Denies bleeding or other skin concerns.  See HPI   PHYSICAL EXAM:  BP 118/68   Pulse 80   Temp 98.3 F (36.8 C)   Ht 5\' 5"  (1.651 m)   Wt 164 lb 9.6 oz (74.7 kg)   LMP 10/09/2015   BMI 27.39 kg/m   Pleasant, well-appearing female, in no distress, in good spirits.  Posterior R axilla there is a 1.5 x 0.4 cm area that is raised, slightly pink centrall only. There appears to be the formation of an early pustule centrally. Very small area of slight fluctuance No axilllary lymphadenopathy noted  ASSESSMENT/PLAN:  Abscess of axilla, right - Plan: doxycycline (VIBRA-TABS) 100 MG tablet   Take the antibiotic twice daily.  Use sunscreen if out in the sun. Use warm compresses at least 3-4 times during the day.  I suspect that this will drain in the next day or so, and then feel better. If you develop any fever, chills, enlarging lump, increase in pain, or other symptoms, please return for recheck.

## 2020-04-14 NOTE — Patient Instructions (Signed)
Take the antibiotic twice daily.  Use sunscreen if out in the sun. Use warm compresses at least 3-4 times during the day.  I suspect that this will drain in the next day or so, and then feel better. If you develop any fever, chills, enlarging lump, increase in pain, or other symptoms, please return for recheck.   Skin Abscess  A skin abscess is an infected area on or under your skin that contains a collection of pus and other material. An abscess may also be called a furuncle, carbuncle, or boil. An abscess can occur in or on almost any part of your body. Some abscesses break open (rupture) on their own. Most continue to get worse unless they are treated. The infection can spread deeper into the body and eventually into your blood, which can make you feel ill. Treatment usually involves draining the abscess. What are the causes? An abscess occurs when germs, like bacteria, pass through your skin and cause an infection. This may be caused by:  A scrape or cut on your skin.  A puncture wound through your skin, including a needle injection or insect bite.  Blocked oil or sweat glands.  Blocked and infected hair follicles.  A cyst that forms beneath your skin (sebaceous cyst) and becomes infected. What increases the risk? This condition is more likely to develop in people who:  Have a weak body defense system (immune system).  Have diabetes.  Have dry and irritated skin.  Get frequent injections or use illegal IV drugs.  Have a foreign body in a wound, such as a splinter.  Have problems with their lymph system or veins. What are the signs or symptoms? Symptoms of this condition include:  A painful, firm bump under the skin.  A bump with pus at the top. This may break through the skin and drain. Other symptoms include:  Redness surrounding the abscess site.  Warmth.  Swelling of the lymph nodes (glands) near the abscess.  Tenderness.  A sore on the skin. How is this  diagnosed? This condition may be diagnosed based on:  A physical exam.  Your medical history.  A sample of pus. This may be used to find out what is causing the infection.  Blood tests.  Imaging tests, such as an ultrasound, CT scan, or MRI. How is this treated? A small abscess that drains on its own may not need treatment. Treatment for larger abscesses may include:  Moist heat or heat pack applied to the area several times a day.  A procedure to drain the abscess (incision and drainage).  Antibiotic medicines. For a severe abscess, you may first get antibiotics through an IV and then change to antibiotics by mouth. Follow these instructions at home: Medicines   Take over-the-counter and prescription medicines only as told by your health care provider.  If you were prescribed an antibiotic medicine, take it as told by your health care provider. Do not stop taking the antibiotic even if you start to feel better. Abscess care   If you have an abscess that has not drained, apply heat to the affected area. Use the heat source that your health care provider recommends, such as a moist heat pack or a heating pad. ? Place a towel between your skin and the heat source. ? Leave the heat on for 20-30 minutes. ? Remove the heat if your skin turns bright red. This is especially important if you are unable to feel pain, heat, or cold. You  may have a greater risk of getting burned.  Follow instructions from your health care provider about how to take care of your abscess. Make sure you: ? Cover the abscess with a bandage (dressing). ? Change your dressing or gauze as told by your health care provider. ? Wash your hands with soap and water before you change the dressing or gauze. If soap and water are not available, use hand sanitizer.  Check your abscess every day for signs of a worsening infection. Check for: ? More redness, swelling, or pain. ? More fluid or blood. ? Warmth. ? More  pus or a bad smell. General instructions  To avoid spreading the infection: ? Do not share personal care items, towels, or hot tubs with others. ? Avoid making skin contact with other people.  Keep all follow-up visits as told by your health care provider. This is important. Contact a health care provider if you have:  More redness, swelling, or pain around your abscess.  More fluid or blood coming from your abscess.  Warm skin around your abscess.  More pus or a bad smell coming from your abscess.  A fever.  Muscle aches.  Chills or a general ill feeling. Get help right away if you:  Have severe pain.  See red streaks on your skin spreading away from the abscess. Summary  A skin abscess is an infected area on or under your skin that contains a collection of pus and other material.  A small abscess that drains on its own may not need treatment.  Treatment for larger abscesses may include having a procedure to drain the abscess and taking an antibiotic. This information is not intended to replace advice given to you by your health care provider. Make sure you discuss any questions you have with your health care provider. Document Revised: 10/12/2018 Document Reviewed: 08/04/2017 Elsevier Patient Education  2020 [employer].

## 2020-04-15 ENCOUNTER — Ambulatory Visit
Admission: RE | Admit: 2020-04-15 | Discharge: 2020-04-15 | Disposition: A | Discharge status: Home / Self Care | Payer: BC Managed Care – PPO | Source: Ambulatory Visit | Attending: Sports Medicine | Admitting: Sports Medicine

## 2020-04-15 DIAGNOSIS — M25561 Pain in right knee: Secondary | ICD-10-CM | POA: Diagnosis not present

## 2020-04-15 DIAGNOSIS — M25562 Pain in left knee: Secondary | ICD-10-CM

## 2020-04-25 DIAGNOSIS — M1712 Unilateral primary osteoarthritis, left knee: Secondary | ICD-10-CM | POA: Diagnosis not present

## 2020-04-29 NOTE — Progress Notes (Deleted)
No chief complaint on file.  Michelle Mack is a 54 y.o. female who presents for a complete physical.   She has the following concerns:  She was recently seen with abscess in R axilla, treated with doxycycline.  She has been seeing Dr. Layne Benton for L knee pain.  She had cortisone injection, followed by Euflexxa injection in 03/2020.  She had MRI earlier this month.  She is planning for surgery. MRI 04/15/2020: IMPRESSION: Dominant finding is advanced for age osteoarthritis which is worst in the medial compartment. Partial radial tear of the root of the posterior horn of the medial meniscus. Longitudinal tear in the posterior horn just peripheral to the meniscal root is identified. Moderate joint effusion contains some debris. Baker's cyst. Findings compatible with a ganglion cyst anterior to the central tibia.  Hypertension: She is currently taking 64m amlodipine, and HCTZ 256m She also takes potassium supplement.  BP'sare not checked elsewhere, just other doctors.She denies chest pain, palpitations, exertional dyspnea, edema, headaches.  BP Readings from Last 3 Encounters:  04/14/20 118/68  01/19/20 135/84  11/01/19 100/60   Diabetes: She is under the care of Dr. AlElyse Hsulast seen 10/2019. Metformin was changed to ER to take 4 tablets every morning (to simplify her regimen--she had been prescribed 100061mID, but hadn't been taking evening dose per his notes).  Last A1c was 7.2 in 10/2019. She remains on chronic prednisone.   She previously was prescribed Farxiga, stopped due to vaginitis and UTI. Also previously prescribed Tradjenta, stopped due to diarrhea.  Previously prescribed Trulicity (unclear why that was stopped, per pt).  She has never been on ACE/ARB She had urine albumin/Cr ratio checked in 10/2019 by Dr. AltElyse Hsud was normal (albumin absent, can't calculate ratio) She sees podiatrist regularly, denies any foot concerns. Last eye exam was over a year ago (she  cannot recall the last visit, didn't get any notification that she is due).  She had trouble reading the vision test today, didn't have her glasses with her.  Denies any problems reading, seeing signs, etc.  Hyperlipidemia: She reports compliance with atorvastatin, denies side effects. Last lipids were done 10/2019 and were at goal. She had cardiac cath in 07/2018 after hospitalized with chest pain.  She had normal coronary arteries.  No further chest pain. Lipid Profile (10/17/2019 8:22 AM EDT) Lipid Profile (10/17/2019 8:22 AM EDT)  Component Value Ref Range Performed At Pathologist Signature  LDL Direct 48 <130 mg/dL [place] [facility]   Total Cholesterol 120 25 - 199 MG/DL [place] [facility]   Triglycerides 104 10 - 150 MG/DL [place] [facility]   HDL Cholesterol 55 35 - 135 MG/DL [place] [facility]   Total Chol / HDL Cholesterol 2.2 <4.5 [place] [facility]   Non-HDL Cholesterol 65Comment:  TARGET: <(LDL-C TARGET + 30)MG/DL MG/DL [place] [facility]     Vitamin D deficiency:She is compliant with taking 2000 IU of D3 daily. [facility] rel[facility] polychond[facility], chronic cough and OSA--under the care of Dr. NamEarly Osmondast seen in 12/2019.  He has been trying to get her to wean off prednisone, but apparently she stopped methotrexate first. He spoke to her in detail again regarding plan--methotrexate 5mg23mekly, and try to wean prednisone from 10 to 5mg.13mhe is due to f/u with him in December. CT of chest was ordered--unable to  view results in Care Everywhere.  Per pt  OSA:  Has she had f/u sleep study???  Last reported: She reports she hasn't used her CPAP machine in about 5 years, "seems like I don't need it".  Denies unrefreshed sleep, daytime somnolence.  This was managed by her pulmonologist.  He mentioned scheduling a f/u sleep study, but  hasn't been done yet.  Other doctors caring for patient: Endo: Dr. Elyse Hsu GYN: Dr. Cletis Media Rheum: Dr. Veneta Penton ([facility], forrelapsing polychondritis)--he left, hasn't seen replacement Pulm: Dr. Early Osmond ([facility]) (no longer sees Dr. Chase Caller) Podiatrist: Dr. Cannon Kettle (bunions) GI: Dr. Collene Mares Dentist: can't recall name, goes once yearly Ophtho: Dr. Manuella Ghazi at [facility] (yearly diabetic eye exams, October) Cardiology: Dr. Einar Gip (normal cath 07/2018) Nephro: Dr. Posey Pronto at [facility] (last by PA Zeyfang in 11/2019)   Immunization History  Administered Date(s) Administered  . Hepatitis A 12/27/2007, 01/24/2008, 07/17/2008  . Hepatitis B 12/27/2007, 01/24/2008, 07/17/2008  . IPV 12/27/2007  . MMR 12/27/2007  . Meningococcal Polysaccharide 12/27/2007  . PFIZER SARS-COV-2 Vaccination 09/18/2019, 10/09/2019  . PPD Test 04/14/2011, 06/08/2013, 01/01/2015, 03/08/2017  . Td 12/28/1995  . Tdap 12/27/2007, 04/11/2019  . Typhoid Inactivated 12/27/2007  . Varicella 12/27/2007  . Yellow Fever 01/24/2008   She refuses flu shots Last Pap smear: 04/2019, normal, no high risk HPV present Last mammogram:05/2019 Last colonoscopy:12/2016 with Dr. Collene Mares. Internal hemorrhoids, repeat 10 years Last DEXA: 07/2019 normal Dentist:goes yearly Ophtho:yearly in October Exercise:Walk/runs about 7 miles every day.  Does weights 3x/week.   PMH, PSH, SH and FH were reviewed and updated    ROS:The patient denies anorexia, headaches, decreased hearing, ear pain, sore throat, breast concerns, chest pain, palpitations, dizziness, syncope, dyspnea on exertion, swelling, nausea, vomiting, diarrhea, abdominal pain, melena, hematochezia, indigestion/heartburn, hematuria, dysuria,vaginal bleeding,vaginal discharge, genital lesions, numbness, tingling, weakness, tremor, suspicious skin lesions, depression, anxiety, abnormal bleeding/bruising, or enlarged lymph nodes. No dysuria. Chronic microscopic hematuria. +mild constipation. Some  hemorrhoidal bleeding, occasionally after straining Vision changes--trouble seeing computer at work. Wears glasses for distance. Back pain improving with chiropractor treatments. L knee pain (bilat??) Cough?   PHYSICAL EXAM:    Wt Readings from Last 3 Encounters:  04/14/20 164 lb 9.6 oz (74.7 kg)  01/18/20 160 lb (72.6 kg)  11/01/19 163 lb 6.4 oz (74.1 kg)    General Appearance:  Alert, cooperative, no distress, appears stated age  Head:  Normocephalic, without obvious abnormality, atraumatic  Eyes:  PERRL, conjunctiva/corneas clear, EOM's intact, fundi benign. Slight proptotic appearance of eyes, chronic  Ears:  Normal TM's and external ear canals  Nose: Not examined, wearing mask due to COVID-19 pandemic  Throat:   Not examined, wearing mask due to COVID-19 pandemic  Neck: Supple, no lymphadenopathy; thyroid: no enlargement/ tenderness/nodules; no carotid bruit or JVD  Back:  Spine nontender, no curvature, ROM normal, no CVA tenderness  Lungs:  Clear to auscultation bilaterally without wheezes, rales or ronchi; respirations unlabored  Chest Wall:  No tenderness or deformity  Heart:  Regular rate and rhythm, S1 and S2 normal, no murmur, rub or gallop  Breast Exam:  No nipple inversion or discharge.  No breast masses or axillary lymphadenopathy. Scar along lateral aspect of the right breast  Abdomen:  Soft, non-tender, nondistended, normoactive bowel sounds, no masses, no hepatosplenomegaly  Genitalia:  Normal external genitalia, without lesions.  BUS and vagina normal.   No cervical motion tenderness, no uterine, adnexal masses or tenderness. Pap not performed.     Rectal: Normal sphincter tone, no masses,  heme negative stool  Extremities: No clubbing, cyanosis or edema. +bunions bilaterally and discolored  2nd toenails.  Callous at right medial great toe. Normal monofilament exam.  Pulses: 2+ and symmetric all extremities  Skin: Skin color,  texture, turgor normal, no rashes or lesions  Lymph nodes: Cervical, supraclavicular, and axillary nodes normal  Neurologic: Normal strength, sensation and gait; reflexes 2+ and symmetric throughout  Psych: Normal mood, affect, hygiene and grooming  DIABETIC FOOT EXAM UPDATE FEET UPDATE AXILLA R   Labs done earlier this month at [facility]: Comprehensive metabolic panel (21/30/8657 4:44 PM EDT) Comprehensive metabolic panel (84/69/6295 4:44 PM EDT)  Component Value Ref Range Performed At Pathologist Signature  Sodium 139 135 - 146 MMOL/L [place] [facility]   Potassium 3.6Comment: NO VISIBLE HEMOLYSIS 3.5 - 5.3 MMOL/L [place] [facility]   Chloride 99 98 - 110 MMOL/L [place] [facility]   CO2 30 23 - 30 MMOL/L [place] [facility]   BUN 19 8 - 24 MG/DL [place] [facility]   Glucose 187 (H)Comment: Patients taking eltrombopag at doses >/= 100 mg daily may show falsely elevated values of 10% or greater. 70 - 99 MG/DL [place] [facility]   Creatinine 1.15 0.50 - 1.50 MG/DL [place] [facility]   Calcium 9.6 8.5 - 10.5 MG/DL [place] [facility]   Total Protein 6.7Comment: Patients taking eltrombopag at doses >/= 100 mg daily may show falsely elevated values of 10% or greater. 6.0 - 8.3 G/DL [place] [facility]   Albumin  4.3 3.5 - 5.0 G/DL [place] [facility]   Total Bilirubin 1.2Comment: Patients taking eltrombopag at doses >/= 100 mg daily may show falsely elevated values of 10% or greater. 0.1 - 1.2 MG/DL [place] [facility]   Alkaline Phosphatase 68 25 - 125 IU/L or U/L [place] [facility]   AST (SGOT) 19 5 - 40 IU/L or U/L [place] [facility]   ALT (SGPT) 23 5 - 50 IU/L or U/L [place] [facility]   Anion Gap 10 4 -  14 MMOL/L [place] [facility]   Est. GFR African American 63Comment: GFR estimated by CKD-EPI equations, reportable up to 90 ML/MIN/1.73 M*2 >=60 ML/MIN/1.73 M*2  [place] [facility]     CBC (04/09/2020 4:44 PM EDT) CBC (04/09/2020 4:44 PM EDT)  Component Value Ref Range Performed At Pathologist Signature  WBC 10.0 4.4 - 11.0 x 10*3/uL [place] [facility]   RBC 4.42 4.10 - 5.10 x 10*6/uL [place] [facility]   Hemoglobin 12.6 12.3 - 15.3 G/DL [place] [facility]   Hematocrit 38.3 35.9 - 44.6 % [place] [facility]   MCV 86.6 80.0 - 96.0 FL [place] [facility]   MCH 28.6 27.5 - 33.2 PG [place] [facility]   MCHC 33.0 33.0 - 37.0 G/DL [place] [facility]   RDW 14.8 12.3 - 17.0 % [place] [facility]   Platelets 323 150 - 450 X 10*3/uL [place] [facility]   MPV 7.4 6.8 - 10.2 [place]  [facility]      ASSESSMENT/PLAN:  She is a  candidate for the [place] booster due to her meds. Pneumovax is also recommended due to DM, high risk meds I know she won't take flu shots (already declined at prior visit)  When was last eye exam???  Last per endo was 2019.  (last we have is 2017?), she prev stated going yearly in October. Request records if she had one, and note the approx date. Has she had f/u sleep study?  (can't tell from [place])  ?Change BP regimen to amlodipine and losartan HCT, stop K and f/u in 2 weeks for BP check and b-met?? Check with nephro??  DM FOOT EXAM  Consider HepC, HIV  Discussed monthly self breast exams and yearly mammograms; at least 30 minutes of aerobic activity at least 5 days/week, weight-bearing exercise at least 2x/wk; proper sunscreen use reviewed; healthy diet, including goals of calcium and vitamin D intake and alcohol recommendations (less  than or equal to 1 drink/day) reviewed; regular seatbelt use; changing batteries in smoke detectors, recommend carbon monoxide detectors. Immunization recommendations discussed--flu shots yearly (she refuses), pneumovax and Shingrix are recommended (to check insurance prior to getting). Declines pneumovax. Colonoscopy recommendations reviewed, UTD, due again 2028.  F/u here 1 year for CPE. Continue routine f/u with her specialists.

## 2020-04-30 ENCOUNTER — Encounter: Payer: BC Managed Care – PPO | Admitting: Family Medicine

## 2020-04-30 ENCOUNTER — Encounter: Payer: Self-pay | Admitting: Family Medicine

## 2020-04-30 DIAGNOSIS — Z0000 Encounter for general adult medical examination without abnormal findings: Secondary | ICD-10-CM

## 2020-04-30 DIAGNOSIS — M941 Relapsing polychondritis: Secondary | ICD-10-CM

## 2020-04-30 DIAGNOSIS — E7800 Pure hypercholesterolemia, unspecified: Secondary | ICD-10-CM

## 2020-04-30 DIAGNOSIS — M25562 Pain in left knee: Secondary | ICD-10-CM

## 2020-04-30 DIAGNOSIS — E559 Vitamin D deficiency, unspecified: Secondary | ICD-10-CM

## 2020-04-30 DIAGNOSIS — I10 Essential (primary) hypertension: Secondary | ICD-10-CM

## 2020-04-30 DIAGNOSIS — G4733 Obstructive sleep apnea (adult) (pediatric): Secondary | ICD-10-CM

## 2020-04-30 DIAGNOSIS — E1122 Type 2 diabetes mellitus with diabetic chronic kidney disease: Secondary | ICD-10-CM

## 2020-05-07 ENCOUNTER — Ambulatory Visit (INDEPENDENT_AMBULATORY_CARE_PROVIDER_SITE_OTHER): Payer: BC Managed Care – PPO | Admitting: Family Medicine

## 2020-05-07 ENCOUNTER — Encounter: Payer: Self-pay | Admitting: Family Medicine

## 2020-05-07 ENCOUNTER — Other Ambulatory Visit: Payer: Self-pay

## 2020-05-07 VITALS — BP 120/60 | HR 76 | Ht 63.0 in | Wt 164.8 lb

## 2020-05-07 DIAGNOSIS — M941 Relapsing polychondritis: Secondary | ICD-10-CM | POA: Diagnosis not present

## 2020-05-07 DIAGNOSIS — E7800 Pure hypercholesterolemia, unspecified: Secondary | ICD-10-CM

## 2020-05-07 DIAGNOSIS — N182 Chronic kidney disease, stage 2 (mild): Secondary | ICD-10-CM

## 2020-05-07 DIAGNOSIS — E559 Vitamin D deficiency, unspecified: Secondary | ICD-10-CM | POA: Diagnosis not present

## 2020-05-07 DIAGNOSIS — Z7185 Encounter for immunization safety counseling: Secondary | ICD-10-CM

## 2020-05-07 DIAGNOSIS — E1122 Type 2 diabetes mellitus with diabetic chronic kidney disease: Secondary | ICD-10-CM | POA: Diagnosis not present

## 2020-05-07 DIAGNOSIS — R079 Chest pain, unspecified: Secondary | ICD-10-CM | POA: Diagnosis not present

## 2020-05-07 DIAGNOSIS — I10 Essential (primary) hypertension: Secondary | ICD-10-CM | POA: Diagnosis not present

## 2020-05-07 DIAGNOSIS — G4733 Obstructive sleep apnea (adult) (pediatric): Secondary | ICD-10-CM

## 2020-05-07 DIAGNOSIS — Z0000 Encounter for general adult medical examination without abnormal findings: Secondary | ICD-10-CM | POA: Diagnosis not present

## 2020-05-07 LAB — POCT URINALYSIS DIP (PROADVANTAGE DEVICE)
Bilirubin, UA: NEGATIVE
Glucose, UA: NEGATIVE mg/dL
Ketones, POC UA: NEGATIVE mg/dL
Leukocytes, UA: NEGATIVE
Nitrite, UA: NEGATIVE
Protein Ur, POC: NEGATIVE mg/dL
Specific Gravity, Urine: 1.03
Urobilinogen, Ur: NEGATIVE
pH, UA: 5.5 (ref 5.0–8.0)

## 2020-05-07 NOTE — Progress Notes (Addendum)
Chief Complaint  Patient presents with  . Annual Exam    nonfasting annual exam. Sees eye doctor for eye exams. Is very fatigued, has been for the last 2 weeks. Surgical clearance was faxed back last week-you did state that [place] would need to obtain updates A1c as requested, patient is asking if you can do today.   . Immunizations    declined.    Michelle Mack is a 54 y.o. female who presents for a complete physical.   She has the following concerns:  She was recently seen with abscess in R axilla, treated with doxycycline. She reports it drained, felt better.  She now can't recall if she ever got the antibiotic, doesn't think she ever got it from the pharmacy.  She has been seeing Dr. Layne Benton for L knee pain.  She had cortisone injection, followed by Euflexxa injections (per notes, 2nd was 9/28) She reports she had a total of 3 injections. She had MRI earlier this month.  She is planning for surgery. MRI 04/15/2020: IMPRESSION: Dominant finding is advanced for age osteoarthritis which is worst in the medial compartment. Partial radial tear of the root of the posterior horn of the medial meniscus. Longitudinal tear in the posterior horn just peripheral to the meniscal root is identified. Moderate joint effusion contains some debris. Baker's cyst. Findings compatible with a ganglion cyst anterior to the central tibia.  Hypertension: She is currently taking $RemoveBeforeDE'5mg'CMNPWMJNvSyDuJU$  amlodipine, and HCTZ $Remove'25mg'uzyeytI$ . She also takes potassium supplement.  BP'sare not checked elsewhere, just other doctors.She denies chest pain, palpitations, exertional dyspnea, edema, headaches. BP Readings from Last 3 Encounters:  04/14/20 118/68  01/19/20 135/84  11/01/19 100/60    Diabetes: She is under the care of Dr. Elyse Hsu, last seen 10/2019. Metformin was changed to ER to take 4 tablets every morning (to simplify her regimen--she had been prescribed $RemoveBeforeD'1000mg'NMjJCjdwxPHodZ$  BID, but hadn't been taking evening dose per his  notes). She states the prescription never got changed, still has $RemoveB'1000mg'dQRsCQvV$  tablets, taking twice daily. She denies missing any evening doses. Denies polydipsia, polyuria, hypoglycemia. Last A1c was 7.2 in 10/2019. She remains on chronic prednisone.   She previously was prescribed Farxiga, stopped due to vaginitis and UTI. Also previously prescribed Tradjenta, stopped due to diarrhea.  Previously prescribed Trulicity (unclear why that was stopped, per pt).  She has never been on ACE/ARB She had urine albumin/Cr ratio checked in 10/2019 by Dr. Elyse Hsu and was normal (albumin absent, can't calculate ratio) She sees podiatrist regularly, denies any foot concerns. Last eye exam was over a year ago, prior to the pandemic.  A1c is needed for surgical clearance.  Since it has been over 6 months since she last saw endocrinologist, she was advised to contact their office to schedule appt (A1c not going to be done here, doesn't replace her need to see endo for f/u).  Hyperlipidemia: She reports compliance with atorvastatin, denies side effects. Last lipidswere done 10/2019 and were at goal. She had cardiac cath in 07/2018 after hospitalized with chest pain.  She had normal coronary arteries.    She complains of having chest pains for the last 2 days, seems the same as she had in 07/2018, when she had negative cath. The pain comes and goes--hasn't had any today. She describes it as a squeezing pressure in mid-chest, like something is "moving on along". No heartburn/indigestion. Not related to exertion or eating. Last time it occurred while she was driving--pulled over, and waited for it to ease off (  about 5 minutes). Occurred twice on the same day, 2 days ago.  Nothing yesterday or today.  Last lipids: Lipid Profile (10/17/2019 8:22 AM EDT)       Lipid Profile (10/17/2019 8:22 AM EDT)  Component Value Ref Range Performed At Pathologist Signature  LDL Direct 48 <130 mg/dL [place] [facility]   Total Cholesterol 120 25 - 199 MG/DL [place] [facility]   Triglycerides 104 10 - 150 MG/DL [place] [facility]   HDL Cholesterol 55 35 - 135 MG/DL [place] [facility]   Total Chol / HDL Cholesterol 2.2 <4.5 [place] [facility]   Non-HDL Cholesterol 65Comment:  TARGET: <(LDL-C TARGET + 30)MG/DL MG/DL [place] [facility]     Vitamin D deficiency:She is compliant with taking D3 daily.Last level was: 56 in 04/2019 at [facility].  Tracheomalacia, relapsing polychondritis, chronic cough and OSA--under the care of Dr. Early Osmond, last seen in 12/2019.  He has been trying to get her to wean off prednisone, but apparently she stopped methotrexate first. He spoke to her in detail again regarding plan--methotrexate 214m weekly, and try to wean prednisone from 10 to 529m  She is due to f/u with him in December. She reports her pain recurred when she decreased to 14m8mtalked to Dr. NamEarly Osmondd is back on 27m74m prednisone daily. CT of chest was ordered--unable to view results in Care Everywhere.  Per pt everything looked fine.  OSA:  She reports she hasn't used her CPAP machine in about 5 years, Had home sleep study about a month ago, ordered by her pulmonologist.  She never got a report.   Other doctors caring for patient: Endo: Dr. AlthElyse Hsu: Dr. RivaCletis Mediat seen in a while) Rheum: Dr. Ang Veneta Penton,forrelapsing polychondritis)--he left, hasn't seen replacement Pulm: Dr. NameTruett Pernalso has seen Dr. RamaChase Callerdiatrist: Dr. StovCannon Kettlenions) GI: Dr. MannCollene Marestist: can't recall name, goes once yearly Ophtho: [facility]. [facility]Manuella [facility]Car[facility], October) Cardiology: Dr. GanjEinar Giprmal cath 07/2018) Nephro: Dr. PatePosey ProntoCKA [facility] by [place] Zey[place] in 11/2019)  Immunization History  Administered Date(s) Administered  . Hepatitis A 12/27/2007, 01/24/2008, 07/17/2008  . Hepatitis B 12/27/2007,  01/24/2008, 07/17/2008  . IPV 12/27/2007  . MMR 12/27/2007  . Meningococcal Polysaccharide 12/27/2007  . PFIZER SARS-COV-2 Vaccination 09/18/2019, 10/09/2019  . PPD Test 04/14/2011, 06/08/2013, 01/01/2015, 03/08/2017  . Td 12/28/1995  . Tdap 12/27/2007, 04/11/2019  . Typhoid Inactivated 12/27/2007  . Varicella 12/27/2007  . Yellow Fever 01/24/2008   She refuses flu shots Last Pap smear: 04/2019, normal, no high risk HPV present Last mammogram:05/2019 Last colonoscopy:12/2016 with Dr. MannCollene Maresternal hemorrhoids, repeat 10 years Last DEXA: 07/2019 normal Dentist:goes yearly Ophtho:prior to pandemic, past due for yearly exam Exercise:She is walking 2 miles every other day.  Previously was walking 7 miles every day, but now limited due to left knee pain.  Used to do weights 3x/week, none recently.   PMH, PSH, SH and FH were reviewed and updated   Outpatient Encounter Medications as of 05/07/2020  Medication Sig Note  . ACCU-CHEK GUIDE test strip    . amLODipine (NORVASC) 5 MG tablet TAKE 1 TABLET BY MOUTH EVERY DAY   . atorvastatin (LIPITOR) 20 MG tablet TAKE 1 TABLET BY MOUTH EVERY DAY   . Cholecalciferol 25 MCG (1000 UT) capsule cholecalciferol (vitamin D3) 25 mcg (1,000 unit) capsule  TAKE 1 CAPSULE BY  MOUTH EVERY DAY   . folic acid (FOLVITE) 1 MG tablet Take by mouth.   . hydrochlorothiazide (HYDRODIURIL) 25 MG tablet Take 25 mg by mouth daily.   Marland Kitchen KLOR-CON M20 20 MEQ tablet TAKE 1 TABLET BY MOUTH EVERY DAY   . metFORMIN (GLUCOPHAGE-XR) 500 MG 24 hr tablet Take 4 tablets in AM once a day for diabetes 05/07/2020: Still taking 1044m twice daily (states she didn't get the 5064mER rx filled)  . methotrexate (RHEUMATREX) 2.5 MG tablet Take by mouth.   . predniSONE (DELTASONE) 5 MG tablet Take 5 mg by mouth 2 (two) times daily. 04/14/2020: Takes 1032mam  . [DISCONTINUED] doxycycline (VIBRA-TABS) 100 MG tablet Take 1 tablet (100 mg total) by mouth 2 (two) times daily.    No  facility-administered encounter medications on file as of 05/07/2020.   Allergies  Allergen Reactions  . Ampicillin Shortness Of Breath  . Penicillins Shortness Of Breath and Swelling    Has patient had a PCN reaction causing immediate rash, facial/tongue/throat swelling, SOB or lightheadedness with hypotension: YES Has patient had a PCN reaction causing severe rash involving mucus membranes or skin necrosis: NO Has patient had a PCN reaction that required hospitalization YES Has patient had a PCN reaction occurring within the last 10 years: NO If all of the above answers are NO, then may proceed with Cephalosporin use.   . Cephalosporins Itching  . Nitrofuran Derivatives Itching    Throat, mouth, hands itch  . Sulfamethoxazole-Trimethoprim Other (See Comments)    "body on fire"  . Sulfur Other (See Comments)    Feeling of burning on the inside  Persist ant cough  . Mobic [Meloxicam] Swelling and Rash    Pt states lips also swelled.    ROS:The patient denies anorexia, headaches, decreased hearing, ear pain, sore throat, breast concerns, palpitations, dizziness, syncope, dyspnea on exertion, swelling, nausea, vomiting, diarrhea, abdominal pain, melena, hematochezia, indigestion/heartburn, hematuria, dysuria,vaginal bleeding,vaginal discharge, genital lesions, numbness, tingling, weakness, tremor, suspicious skin lesions, depression, anxiety, abnormal bleeding/bruising, or enlarged lymph nodes. No dysuria. Chronic microscopic hematuria. +mild constipation. Some hemorrhoidal bleeding, occasionally after straining When prednisone cut back--had aching pains in chest. Chest pain per HPI L knee pain per HPI Having some L ear pain (thinks from polychondritis), tender to touch.  Denies change in hearing. Some blurred vision, due for eye exam.   PHYSICAL EXAM:  BP 120/60   Pulse 76   Ht 5' 3"  (1.6 m)   Wt 164 lb 12.8 oz (74.8 kg)   LMP 10/09/2015   BMI 29.19 kg/m    Wt  Readings from Last 3 Encounters:  04/14/20 164 lb 9.6 oz (74.7 kg)  01/18/20 160 lb (72.6 kg)  11/01/19 163 lb 6.4 oz (74.1 kg)   General Appearance:  Alert, cooperative, no distress, appears stated age  Head:  Normocephalic, without obvious abnormality, atraumatic  Eyes:  PERRL, conjunctiva/corneas clear, EOM's intact, fundi not well visualized. Slight proptotic appearance of eyes, chronic  Ears:  Normal TM's and external ear canals.  Normal appearance, but tender to touch the left external ear (cartilage).  Nose: Not examined, wearing mask due to COVID-19 pandemic  Throat:   Not examined, wearing mask due to COVID-19 pandemic  Neck: Supple, no lymphadenopathy; thyroid: no enlargement/ tenderness/nodules; no carotid bruit or JVD  Back:  Spine nontender, no curvature, ROM normal, no CVA tenderness  Lungs:  Clear to auscultation bilaterally without wheezes, rales or ronchi; respirations unlabored  Chest Wall:  No deformity. She  is mildly tender over the xiphoid process, nontender elsewhere.  Heart:  Regular rate and rhythm, S1 and S2 normal, no murmur, rub or gallop  Breast Exam:  No nipple inversion or discharge.  No breast masses or axillary lymphadenopathy. Scar along lateral aspect of the right breast. Small residual area of induration at right posterior axilla--area of recent cyst.  Nontender, no erythema.  Abdomen:  Soft, non-tender, nondistended, normoactive bowel sounds, no masses, no hepatosplenomegaly  Genitalia:  Normal external genitalia, without lesions.  BUS and vagina normal.   No cervical motion tenderness, no uterine, adnexal masses or tenderness. Pap not performed.     Rectal: Normal sphincter tone, no masses, heme negative stool  Extremities: No clubbing, cyanosis or edema. L knee in brace, not examined. +bunions bilaterally. toenails are polished. Callous at R>L medial great toes, and some thickening/callouses on heels. Normal monofilament  exam.  Pulses: 2+ and symmetric all extremities  Skin: Skin color, texture, turgor normal, no rashes or lesions  Lymph nodes: Cervical, supraclavicular, and axillary nodes normal  Neurologic: Normal strength, sensation and gait; reflexes 2+ and symmetric throughout  Psych: Normal mood, affect, hygiene and grooming  Labs done earlier this month at [facility]: Comprehensive metabolic panel (71/12/2692 4:44 PM EDT)       Comprehensive metabolic panel (85/46/2703 4:44 PM EDT)  Component Value Ref Range Performed At Pathologist Signature  Sodium 139 135 - 146 MMOL/L [place] [facility]   Potassium 3.6Comment: NO VISIBLE HEMOLYSIS 3.5 - 5.3 MMOL/L [place] [facility]   Chloride 99 98 - 110 MMOL/L [place] [facility]   CO2 30 23 - 30 MMOL/L [place] [facility]   BUN 19 8 - 24 MG/DL [place] [facility]   Glucose 187 (H)Comment: Patients taking eltrombopag at doses >/= 100 mg daily may show falsely elevated values of 10% or greater. 70 - 99 MG/DL [place] [facility]   Creatinine 1.15 0.50 - 1.50 MG/DL [place] [facility]   Calcium 9.6 8.5 - 10.5 MG/DL [place] [facility]   Total Protein 6.7Comment: Patients taking eltrombopag at doses >/= 100 mg daily may show falsely elevated values of 10% or greater. 6.0 - 8.3 G/DL [place] [facility]   Albumin  4.3 3.5 - 5.0 G/DL [place] [facility]   Total Bilirubin 1.2Comment: Patients taking eltrombopag at doses >/= 100 mg daily may show falsely elevated values of 10% or greater. 0.1 - 1.2 MG/DL [place] [facility]   Alkaline Phosphatase 68 25 - 125 IU/L or U/L [place] [facility]   AST (SGOT) 19 5 - 40 IU/L or U/L [place] [facility]   ALT (SGPT) 23 5 - 50 IU/L or U/L [place] [facility]   Anion Gap 10 4 - 14 MMOL/L [place] [facility]   Est. GFR African American 63Comment: GFR estimated by CKD-EPI equations, reportable up to 90 ML/MIN/1.73 M*2 >=60 ML/MIN/1.73 M*2  [place] [facility]     CBC (04/09/2020 4:44 PM EDT)       CBC (04/09/2020 4:44 PM EDT)  Component Value Ref Range Performed At Pathologist Signature  WBC 10.0 4.4 - 11.0 x 10*3/uL [place] [facility]   RBC 4.42 4.10 - 5.10 x 10*6/uL [place] [facility]   Hemoglobin 12.6 12.3 - 15.3 G/DL [place] [facility]   Hematocrit 38.3 35.9 - 44.6 % [place] [facility]   MCV 86.6 80.0 - 96.0 FL [place] [facility]   MCH 28.6 27.5 - 33.2 PG [place] [facility]   MCHC 33.0 33.0 - 37.0 G/DL [place] [facility]   RDW 14.8 12.3 - 17.0 % [place] [facility]   Platelets 323 150 - 450 X 10*3/uL [place] [facility]   MPV 7.4 6.8 - 10.2 [place]  [facility]     EKG: NSR, nonspecific TW changes, unchanged from prior EKG.  ASSESSMENT/PLAN:  Annual physical exam - Plan: POCT Urinalysis DIP (Proadvantage Device)  Type 2 diabetes mellitus with stage 2 chronic kidney disease, without long-term current use of insulin (HCC) - advised she is due to f/u with Dr. Elyse Hsu and needs to schedule her diabetic eye exam. Cont metformin  Essential hypertension, benign - well controlled. Unclear why she isn't on ACE or ARB. Will check with nephro  Vitamin D deficiency - continue daily supplement  Relapsing polychondritis - on methotrexate and prednisone. Discussed prednisone risks, and probably need for slower taper (63m at a time)  OSA (obstructive sleep apnea) - advised pt to contact pulm for results from her recent sleep study, to see if CPAP needs to be resumed  Chest pain, unspecified type - clean  cath, no change to EKG. Ddx reviewed, including esophageal spasm, vs tracheal/bronchial. Tender at xiphoid. Reassured not cardiac - Plan: EKG 12-Lead  Vaccine counseling - Encouraged/discussed flu shot, pneumovax (due to DM), COVID booster and Shingrix. She declined all vaccines today  Hypercholesteremia - cont statin, LDL at goal on last check  Reach out to Dr. PPosey Prontoto get thoughts on changing BP meds to include ARB.  My rec would be to change to losartan HCT, be able to lower the HCT dose and possibly get rid of the need for K+, and to lower amlodipine if BP's too low.  Discussed care gaps of Hep C (new recommendation), and HIV.  Other labs aren't needed and she isn't high risk, so won't do these today, but with next labs.  Discussed monthly self breast exams and yearly mammograms; at least 30 minutes of aerobic activity at least 5 days/week, weight-bearing exercise at least 2x/wk; proper sunscreen use reviewed; healthy diet, including goals of calcium and vitamin D intake and alcohol recommendations (less than or equal to 1 drink/day) reviewed; regular seatbelt use; changing batteries in smoke detectors, recommend carbon monoxide detectors. Immunization recommendations discussed--flu shots yearly (she refuses), pneumovax and COVID booster all discussed. Shingrix also recommended(to check insurance prior to getting). Declines all vaccines today.Colonoscopy recommendations reviewed, UTD, due again 2028.  F/u here 1 year for CPE. Continue routine f/u with her specialists.  FTF time 70+ minutes, plus additional time (at least 20 mins) spent in chart review, coordination of care, and documentation.  Addendum:  Dr. PPosey Prontorecalled cough from ACEI, recommended Olmesartan. He also suggested potential benefit from SGLT-2 inhibitor.  Will stop HCTZ and change to olmesartan HCT 20/12.5.  If BP drops under 1962-836systolic, can cut amlodipine in half.  Stop potassium.  Return in 2 weeks for b-met, can  be nonfasting.

## 2020-05-07 NOTE — Patient Instructions (Addendum)
HEALTH MAINTENANCE RECOMMENDATIONS:  It is recommended that you get at least 30 minutes of aerobic exercise at least 5 days/week (for weight loss, you may need as much as 60-90 minutes). This can be any activity that gets your heart rate up. This can be divided in 10-15 minute intervals if needed, but try and build up your endurance at least once a week.  Weight bearing exercise is also recommended twice weekly.  Eat a healthy diet with lots of vegetables, fruits and fiber.  "Colorful" foods have a lot of vitamins (ie green vegetables, tomatoes, red peppers, etc).  Limit sweet tea, regular sodas and alcoholic beverages, all of which has a lot of calories and sugar.  Up to 1 alcoholic drink daily may be beneficial for women (unless trying to lose weight, watch sugars).  Drink a lot of water.  Calcium recommendations are 1200-1500 mg daily (1500 mg for postmenopausal women or women without ovaries), and vitamin D 1000 IU daily.  This should be obtained from diet and/or supplements (vitamins), and calcium should not be taken all at once, but in divided doses.  Monthly self breast exams and yearly mammograms for women over the age of 83 is recommended.  Sunscreen of at least SPF 30 should be used on all sun-exposed parts of the skin when outside between the hours of 10 am and 4 pm (not just when at beach or pool, but even with exercise, golf, tennis, and yard work!)  Use a sunscreen that says "broad spectrum" so it covers both UVA and UVB rays, and make sure to reapply every 1-2 hours.  Remember to change the batteries in your smoke detectors when changing your clock times in the spring and fall. Carbon monoxide detectors are recommended for your home.  Use your seat belt every time you are in a car, and please drive safely and not be distracted with cell phones and texting while driving.  Pneumovax is recommended given your diabetes and medications.   COVID booster is also recommended (since  you are more than 6 months since your 2nd shot, and have diabetes and are on immunosuppressant medications).  Flu shots are recommended yearly as well.  I recommend getting the new shingles vaccine (Shingrix).  If you have commercial insurance, check with your insurance to verify what your out of pocket cost may be (usually covered as preventative, but better to verify to avoid any surprises, as this vaccine is expensive), and then schedule a nurse visit at our office when convenient (based on the possible side effects as discussed).   This is a series of 2 injections, spaced 2 months apart.  It doesn't have to be exactly 2 months apart (but can't be under 2 months), if that isn't feasible for your schedule, but try and get them close to 2 months (and definitely within 6 months of each other, or else the efficacy of the vaccine drops off).  The above-mentioned vaccines can either be given the same day, or should be separated by 2 weeks minimum.  Please contact your eye doctor to schedule your yearly diabetic eye exam.  Contact Dr. Darryl Nestle office--you are due for diabetes follow-up (and need the A1c and his recommendation for surgery).  Contact Dr. Burt Knack office to get the results of your sleep study (I don't have access to this).  You are due for yearly mammogram, please schedule.  We discussed that mammogram should be 6 weeks after COVID booster, or get the mammogram PRIOR to your COVID  booster.

## 2020-05-08 ENCOUNTER — Other Ambulatory Visit: Payer: Self-pay | Admitting: Family Medicine

## 2020-05-08 DIAGNOSIS — Z1231 Encounter for screening mammogram for malignant neoplasm of breast: Secondary | ICD-10-CM

## 2020-05-12 NOTE — Progress Notes (Signed)
Patient was working, will call me back when she gets a free moment and can write down info.

## 2020-05-14 DIAGNOSIS — E1165 Type 2 diabetes mellitus with hyperglycemia: Secondary | ICD-10-CM | POA: Diagnosis not present

## 2020-05-14 DIAGNOSIS — E7800 Pure hypercholesterolemia, unspecified: Secondary | ICD-10-CM | POA: Diagnosis not present

## 2020-05-14 DIAGNOSIS — Z79899 Other long term (current) drug therapy: Secondary | ICD-10-CM | POA: Diagnosis not present

## 2020-05-27 DIAGNOSIS — M1712 Unilateral primary osteoarthritis, left knee: Secondary | ICD-10-CM | POA: Diagnosis not present

## 2020-05-28 NOTE — Progress Notes (Signed)
Called patient again, she will call me back once she speaks to Dr. Jackquline [place] still hasn't spoken to him.

## 2020-06-16 ENCOUNTER — Ambulatory Visit
Admission: RE | Admit: 2020-06-16 | Discharge: 2020-06-16 | Disposition: A | Discharge status: Home / Self Care | Payer: BC Managed Care – PPO | Source: Ambulatory Visit | Attending: Family Medicine | Admitting: Family Medicine

## 2020-06-16 ENCOUNTER — Other Ambulatory Visit: Payer: Self-pay

## 2020-06-16 DIAGNOSIS — Z1231 Encounter for screening mammogram for malignant neoplasm of breast: Secondary | ICD-10-CM

## 2020-06-17 ENCOUNTER — Ambulatory Visit (INDEPENDENT_AMBULATORY_CARE_PROVIDER_SITE_OTHER): Payer: BC Managed Care – PPO

## 2020-06-17 DIAGNOSIS — Z23 Encounter for immunization: Secondary | ICD-10-CM

## 2020-06-25 ENCOUNTER — Other Ambulatory Visit: Payer: Self-pay | Admitting: Family Medicine

## 2020-06-25 DIAGNOSIS — E7800 Pure hypercholesterolemia, unspecified: Secondary | ICD-10-CM

## 2020-06-26 ENCOUNTER — Encounter: Payer: Self-pay | Admitting: *Deleted

## 2020-06-26 ENCOUNTER — Other Ambulatory Visit: Payer: Self-pay | Admitting: Family Medicine

## 2020-06-26 ENCOUNTER — Other Ambulatory Visit: Payer: Self-pay | Admitting: *Deleted

## 2020-06-26 DIAGNOSIS — I152 Hypertension secondary to endocrine disorders: Secondary | ICD-10-CM

## 2020-06-26 DIAGNOSIS — I10 Essential (primary) hypertension: Secondary | ICD-10-CM

## 2020-06-26 MED ORDER — OLMESARTAN MEDOXOMIL-HCTZ 20-12.5 MG PO TABS: 1.0000 | ORAL_TABLET | Freq: Every day | ORAL | 0 refills | Status: DC

## 2020-07-02 DIAGNOSIS — E559 Vitamin D deficiency, unspecified: Secondary | ICD-10-CM | POA: Diagnosis not present

## 2020-07-02 DIAGNOSIS — E7800 Pure hypercholesterolemia, unspecified: Secondary | ICD-10-CM | POA: Diagnosis not present

## 2020-07-02 DIAGNOSIS — Z79899 Other long term (current) drug therapy: Secondary | ICD-10-CM | POA: Diagnosis not present

## 2020-07-02 DIAGNOSIS — E1165 Type 2 diabetes mellitus with hyperglycemia: Secondary | ICD-10-CM | POA: Diagnosis not present

## 2020-07-02 LAB — LIPID PANEL: LDL Cholesterol: 59

## 2020-07-02 LAB — HEMOGLOBIN A1C: Hemoglobin A1C: 7.9

## 2020-07-09 ENCOUNTER — Telehealth: Payer: BC Managed Care – PPO | Admitting: Family Medicine

## 2020-07-09 ENCOUNTER — Other Ambulatory Visit: Payer: Self-pay

## 2020-07-09 ENCOUNTER — Other Ambulatory Visit (INDEPENDENT_AMBULATORY_CARE_PROVIDER_SITE_OTHER): Payer: BC Managed Care – PPO

## 2020-07-09 ENCOUNTER — Encounter: Payer: Self-pay | Admitting: Family Medicine

## 2020-07-09 VITALS — Ht 64.0 in | Wt 164.0 lb

## 2020-07-09 DIAGNOSIS — Z20822 Contact with and (suspected) exposure to covid-19: Secondary | ICD-10-CM

## 2020-07-09 LAB — POC COVID19 BINAXNOW: SARS Coronavirus 2 Ag: NEGATIVE

## 2020-07-09 NOTE — Progress Notes (Signed)
   Subjective:    Patient ID: Michelle Mack, female    DOB: [birth year], 55 y.o.   MRN: [id number]  HPI I connected with  Cherisa Brucker on 07/09/20 by a video enabled telemedicine application and verified that I am speaking with the correct person using two identifiers.  Caregility used.  I am in the office.  She is at home.  I discussed the limitations of evaluation and management by telemedicine. The patient expressed understanding and agreed to proceed. She was apparently exposed to someone with Covid.  She has had her boosters.  Presently she is having no symptoms but did apparently need to get tested for work-related issues.   Review of Systems     Objective:   Physical Exam Alert and in no distress otherwise not examined       Assessment & Plan:  Exposure to COVID-19 virus The rapid test was negative and the PCR was sent off for. 15 minutes spent discussing this with her.

## 2020-07-10 ENCOUNTER — Other Ambulatory Visit: Payer: Self-pay

## 2020-07-11 DIAGNOSIS — E7800 Pure hypercholesterolemia, unspecified: Secondary | ICD-10-CM | POA: Diagnosis not present

## 2020-07-11 DIAGNOSIS — Z7984 Long term (current) use of oral hypoglycemic drugs: Secondary | ICD-10-CM | POA: Diagnosis not present

## 2020-07-11 DIAGNOSIS — Z7952 Long term (current) use of systemic steroids: Secondary | ICD-10-CM | POA: Diagnosis not present

## 2020-07-11 DIAGNOSIS — E1165 Type 2 diabetes mellitus with hyperglycemia: Secondary | ICD-10-CM | POA: Diagnosis not present

## 2020-07-11 LAB — SARS-COV-2, NAA 2 DAY TAT

## 2020-07-11 LAB — NOVEL CORONAVIRUS, NAA: SARS-CoV-2, NAA: NOT DETECTED

## 2020-07-17 ENCOUNTER — Other Ambulatory Visit: Payer: Self-pay

## 2020-07-17 ENCOUNTER — Other Ambulatory Visit: Payer: BC Managed Care – PPO

## 2020-07-17 DIAGNOSIS — I152 Hypertension secondary to endocrine disorders: Secondary | ICD-10-CM | POA: Diagnosis not present

## 2020-07-17 DIAGNOSIS — E1159 Type 2 diabetes mellitus with other circulatory complications: Secondary | ICD-10-CM | POA: Diagnosis not present

## 2020-07-17 LAB — BASIC METABOLIC PANEL
BUN/Creatinine Ratio: 14 (ref 9–23)
BUN: 14 mg/dL (ref 6–24)
CO2: 25 mmol/L (ref 20–29)
Calcium: 10 mg/dL (ref 8.7–10.2)
Chloride: 101 mmol/L (ref 96–106)
Creatinine, Ser: 0.99 mg/dL (ref 0.57–1.00)
GFR calc Af Amer: 76 mL/min/{1.73_m2} (ref >59)
GFR calc non Af Amer: 66 mL/min/{1.73_m2} (ref >59)
Glucose: 106 mg/dL — ABNORMAL HIGH (ref 65–99)
Potassium: 3.6 mmol/L (ref 3.5–5.2)
Sodium: 142 mmol/L (ref 134–144)

## 2020-07-24 ENCOUNTER — Other Ambulatory Visit: Payer: Self-pay | Admitting: Family Medicine

## 2020-07-24 NOTE — Telephone Encounter (Signed)
If BP was normal when she came for labs, can set up med check for May, and schedule CPE for 05/2021. To be sooner than May if any issues or changes in her BP meds. Okay to refill through May (30 vs 90d per pt preference)

## 2020-07-24 NOTE — Telephone Encounter (Signed)
Wasn't sure how long to refill this for. Looked at last OV (CPE) and she isn't due to come back until Nov 2022-nothing scheduled though.

## 2020-07-30 ENCOUNTER — Encounter: Payer: Self-pay | Admitting: Family Medicine

## 2020-07-30 ENCOUNTER — Other Ambulatory Visit: Payer: Self-pay

## 2020-07-30 ENCOUNTER — Ambulatory Visit: Payer: BC Managed Care – PPO | Admitting: Family Medicine

## 2020-07-30 VITALS — BP 132/70 | HR 68 | Temp 99.7°F | Ht 63.0 in | Wt 165.0 lb

## 2020-07-30 DIAGNOSIS — R109 Unspecified abdominal pain: Secondary | ICD-10-CM | POA: Diagnosis not present

## 2020-07-30 DIAGNOSIS — R519 Headache, unspecified: Secondary | ICD-10-CM | POA: Diagnosis not present

## 2020-07-30 DIAGNOSIS — M545 Low back pain, unspecified: Secondary | ICD-10-CM | POA: Diagnosis not present

## 2020-07-30 DIAGNOSIS — R509 Fever, unspecified: Secondary | ICD-10-CM

## 2020-07-30 DIAGNOSIS — E1159 Type 2 diabetes mellitus with other circulatory complications: Secondary | ICD-10-CM

## 2020-07-30 DIAGNOSIS — I152 Hypertension secondary to endocrine disorders: Secondary | ICD-10-CM

## 2020-07-30 LAB — POCT URINALYSIS DIP (PROADVANTAGE DEVICE)
Bilirubin, UA: NEGATIVE
Glucose, UA: NEGATIVE mg/dL
Ketones, POC UA: NEGATIVE mg/dL
Leukocytes, UA: NEGATIVE
Nitrite, UA: NEGATIVE
Protein Ur, POC: NEGATIVE mg/dL
Specific Gravity, Urine: 1.025
Urobilinogen, Ur: NEGATIVE
pH, UA: 5.5 (ref 5.0–8.0)

## 2020-07-30 LAB — POCT INFLUENZA A/B
Influenza A, POC: NEGATIVE
Influenza B, POC: NEGATIVE

## 2020-07-30 LAB — POC COVID19 BINAXNOW: SARS Coronavirus 2 Ag: NEGATIVE

## 2020-07-30 NOTE — Patient Instructions (Addendum)
Drink plenty of fluids. continue your current blood pressure medication--it is working well. Use tylenol as needed for headache and pain. Use a heating pad for your back Some of the headache seems like it is in the sinuses--you can try sinus rinses, saline spray.  Rapid COVID test was negative. A PCR test was sent out. I recommend staying home until the results are back, to be safe and not further spread any illness, in case it is COVID.

## 2020-07-30 NOTE — Progress Notes (Signed)
Veronica, CMA, asked me to look at patient to determine if she should test her for covid prior to Dr. Tomi Bamberger going into the room.

## 2020-07-30 NOTE — Addendum Note (Signed)
Addended by: Carolee Rota F on: 07/30/2020 04:36 PM   Modules accepted: Orders

## 2020-07-30 NOTE — Addendum Note (Signed)
Addended by: [name] on: 07/30/2020 05:44 PM   Modules accepted: Level of Service

## 2020-07-30 NOTE — Progress Notes (Addendum)
Chief Complaint  Patient presents with  . Abdominal Pain    Left sided abdominal pain that also radiates around to her lower back and unbearable headache that started Sunday. Stopped olmesartan Monday and Tuesday-HA went away. Resumed medication today and all symptoms are now present. Also extremely fatigued and eyes are red.    Patient presents with L sided abdominal pain. Started 2 days ago while at work. Pain comes and goes. It is sharp.  Starts at LLQ, radiates to the back/side. Sharp, short-lived, comes and goes. Occurs up to 5x in an hour, but not all day long, sporadically. Not associated with any movement. Chronic constipation, no change recently. Last BM yesterday, normal, no improvement in pain after, unchanged.  No known fever/chills. No nausea or vomiting. No heartburn.  No change in the last 2 days. Not taken any meds for it.  She has been having headaches--started 3 days ago.  She thought it could be related to olmesartan, her newest medicatoin.  She has been taking this for 3-4 weeks. Didn't take olmesartan yesterday and the day before, headache resolved, but took it today, and has a headache. So, wonders if medicine is causing it. Didn't have any headaches prior to Sunday. She is fatigued.  Headaches are over both eyes and in the top of the forehead Not at temples.  Lasts all day, 7/10 pain currently, feels like it is making her squint her eyes. No associated nausea, vomiting. Describes headache as a pressure, not sharp.  PMH, PSH, SH reviewed  Outpatient Encounter Medications as of 07/30/2020  Medication Sig Note  . ACCU-CHEK GUIDE test strip    . amLODipine (NORVASC) 5 MG tablet TAKE 1 TABLET BY MOUTH EVERY DAY   . atorvastatin (LIPITOR) 20 MG tablet TAKE 1 TABLET BY MOUTH EVERY DAY   . Cholecalciferol 25 MCG (1000 UT) capsule cholecalciferol (vitamin D3) 25 mcg (1,000 unit) capsule  TAKE 1 CAPSULE BY MOUTH EVERY DAY   . folic acid (FOLVITE) 1 MG tablet Take by  mouth.   . metFORMIN (GLUCOPHAGE-XR) 500 MG 24 hr tablet Take 4 tablets in AM once a day for diabetes 05/07/2020: Still taking 1000mg  twice daily (states she didn't get the 500mg  ER rx filled)  . methotrexate (RHEUMATREX) 2.5 MG tablet Take by mouth.   . olmesartan-hydrochlorothiazide (BENICAR HCT) 20-12.5 MG tablet TAKE 1 TABLET BY MOUTH EVERY DAY   . predniSONE (DELTASONE) 5 MG tablet Take 5 mg by mouth 2 (two) times daily. 04/14/2020: Takes 10mg  qam  . KLOR-CON M20 20 MEQ tablet TAKE 1 TABLET BY MOUTH EVERY DAY (Patient not taking: No sig reported)    No facility-administered encounter medications on file as of 07/30/2020.   Allergies reviewed  ROS:  No known fever/chills (but LG noted today in office).  Headaches (sinus in location) per HPI.  Denies runny nose, PND, sore throat, nasal congestion. No nausea, vomiting, bowel changes (chronic constipation), heartburn, bleeding, bruising, rashes.  +fatigue per HPI.   PHYSICAL EXAM:  BP 132/70   Pulse 68   Temp 99.7 F (37.6 C) (Tympanic)   Ht 5\' 3"  (1.6 m)   Wt 165 lb (74.8 kg)   LMP 10/09/2015   BMI 29.23 kg/m   Pleasant female, appears tired, not in acute distress HEENT: conjunctiva and sclera are clear, EOMI. Wearing mask Tender over frontal sinuses, nontender over maxillary sinuses. Neck: no lymphadenopathy, thyromegaly or mass Heart: regular rate and rhythm Lungs: clear bilaterally Back: no spinal or CVA tenderness.  She is  tender just below the CVA, over the muscles on LL back, laterally. Abdomen: soft, nontender, no organomegaly or mass. No rebound or guarding.  Area of discomfort is more laterally and posterior, at the back.  No tenderness on abdominal exam (but area of discomfort is LLQ, and a little more lateral, at the hip level)  Urine dip:  Large blood (chronic).  SG 1.025. Negative leuk, nitrite.  Influenza A&B negative Rapid COVID negative COVID PCR sent   ASSESSMENT/PLAN:  Low back pain, unspecified back pain  laterality, unspecified chronicity, unspecified whether sciatica present - suspect muscular--tylenol, heat. reassured not related to kidney, no abdominal pathology - Plan: POCT Urinalysis DIP (Proadvantage Device), POC COVID-19, Influenza A/B, Novel Coronavirus, NAA ([facility])  Abdominal pain, unspecified abdominal location - benign exam, reassured.  Ddx reviewed in detail - Plan: POCT Urinalysis DIP (Proadvantage Device), POC COVID-19, Influenza A/B, Novel Coronavirus, NAA ([facility])  Acute nonintractable headache, unspecified headache type - ddx reviewed--cannot r/o COVID. Supportive measures, tylenol, sinus rinses - Plan: POC COVID-19, Influenza A/B, Novel Coronavirus, NAA ([facility])  Hypertension associated with diabetes ([place]) - BP is well controlled.  I do not think olmesartan is cause of her headaches, to continue  Fever, unspecified fever cause - LG fever--pt unaware. Given headache, fever, abdominal pain, r/o COVID. Pt to stay OOW until PCR back

## 2020-08-01 LAB — NOVEL CORONAVIRUS, NAA: SARS-CoV-2, NAA: NOT DETECTED

## 2020-08-01 LAB — SARS-COV-2, NAA 2 DAY TAT

## 2020-08-04 ENCOUNTER — Other Ambulatory Visit: Payer: Self-pay | Admitting: Family Medicine

## 2020-08-04 DIAGNOSIS — I10 Essential (primary) hypertension: Secondary | ICD-10-CM

## 2020-08-05 LAB — HM DIABETES EYE EXAM

## 2020-09-03 ENCOUNTER — Other Ambulatory Visit: Payer: Self-pay

## 2020-09-03 ENCOUNTER — Encounter: Payer: Self-pay | Admitting: Family Medicine

## 2020-09-03 ENCOUNTER — Ambulatory Visit: Payer: BC Managed Care – PPO | Admitting: Family Medicine

## 2020-09-03 VITALS — BP 108/70 | HR 80 | Ht 63.0 in | Wt 171.4 lb

## 2020-09-03 DIAGNOSIS — Z5181 Encounter for therapeutic drug level monitoring: Secondary | ICD-10-CM

## 2020-09-03 DIAGNOSIS — M25561 Pain in right knee: Secondary | ICD-10-CM

## 2020-09-03 DIAGNOSIS — E876 Hypokalemia: Secondary | ICD-10-CM | POA: Diagnosis not present

## 2020-09-03 DIAGNOSIS — M25562 Pain in left knee: Secondary | ICD-10-CM

## 2020-09-03 LAB — BASIC METABOLIC PANEL
BUN/Creatinine Ratio: 22 (ref 9–23)
BUN: 22 mg/dL (ref 6–24)
CO2: 24 mmol/L (ref 20–29)
Calcium: 9.9 mg/dL (ref 8.7–10.2)
Chloride: 102 mmol/L (ref 96–106)
Creatinine, Ser: 1 mg/dL (ref 0.57–1.00)
Glucose: 91 mg/dL (ref 65–99)
Potassium: 3.7 mmol/L (ref 3.5–5.2)
Sodium: 142 mmol/L (ref 134–144)
eGFR: 68 mL/min/{1.73_m2} (ref >59)

## 2020-09-03 NOTE — Patient Instructions (Signed)
Continue to wear supportive shoes. Wear the knee brace to the left as directed (to help support the knee, given your known tear, and the locking and giving way that you have).  I'm sure you have arthritis in both knees (we saw it on the MRI on the left).  If the swelling and pain recurs, ice, elevated and use tylenol arthritis as needed. Follow up with the orthopedist if swelling and pain isn't resolving (you may benefit from a cortisone shot at that time--not indicated currently, since it is so much better).  We are going to check your potassium today--I suspect that it may be low and you may need to restart the potassium prescription.  Looks like you may have just run out of it (last prescribed for 90 days in September).

## 2020-09-03 NOTE — Progress Notes (Signed)
Chief Complaint  Patient presents with  . Knee Pain    B/L knee pain. Wants to know if she could have fluid in her knees. Over the last few days they have been very swollen and it has been painful when she walks. She rested and elevated them and it has gone down. She is not taking K[place] Con-wants to know if she should be taking?   Bilateral knee pain and swelling started 2 days ago. She could see the swelling, appeared symmetric. Hurt to walk. She had been on her feet a lot (seeing a lot of doctors) the day the pain/swelling developed.  No known trauma or injury.  She didn't take anything for the pain/swelling. She rested and elevated her legs for the rest of the day, and the swelling was down a little the next day. She can now walk without pain.  She had eval for L knee pain by [facility]/W[facility] a few months ago, but this feels different.  She had MRI L knee 04/2020 which showed a partial radial tear of medial meniscus.   Patient reports that surgery was recommended. She has been wearing a brace, but recently hasn't been wearing it as she developed a bruise medially at the left knee and wearing it was painful.  MRI 04/2020: IMPRESSION: Dominant finding is advanced for age osteoarthritis which is worst in the medial compartment. Partial radial tear of the root of the posterior horn of the medial meniscus. Longitudinal tear in the posterior horn just peripheral to the meniscal root is identified. Moderate joint effusion contains some debris. Baker's cyst. Findings compatible with a ganglion cyst anterior to the central tibia.  Hasn't been taking potassium, for at least a month.  She isn't sure why/when this was stopped, or if she just ran out.  She had potassium last checked in 07/2020, normal at 3.6  (had been low at 3.2 in 01/2020)   PMH, PSH, SH reviewed  Outpatient Encounter Medications as of 09/03/2020  Medication Sig Note  . ACCU-CHEK GUIDE test strip    . amLODipine (NORVASC) 5 MG  tablet TAKE 1 TABLET BY MOUTH EVERY DAY   . atorvastatin (LIPITOR) 20 MG tablet TAKE 1 TABLET BY MOUTH EVERY DAY   . Cholecalciferol 25 MCG (1000 UT) capsule cholecalciferol (vitamin D3) 25 mcg (1,000 unit) capsule  TAKE 1 CAPSULE BY MOUTH EVERY DAY   . folic acid (FOLVITE) 1 MG tablet Take by mouth.   . metFORMIN (GLUCOPHAGE-XR) 500 MG 24 hr tablet Take 4 tablets in AM once a day for diabetes 05/07/2020: Still taking $RemoveBefo'1000mg'culGeqhzUvb$  twice daily (states she didn't get the $Remov'500mg'pUKbpj$  ER rx filled)  . methotrexate (RHEUMATREX) 2.5 MG tablet Take by mouth.   . olmesartan-hydrochlorothiazide (BENICAR HCT) 20-12.5 MG tablet TAKE 1 TABLET BY MOUTH EVERY DAY   . predniSONE (DELTASONE) 5 MG tablet Take 5 mg by mouth 2 (two) times daily. 04/14/2020: Takes $RemoveBeforeDEI'10mg'kEmNMDsFKKMomsFc$  qam  . KLOR-CON M20 20 MEQ tablet TAKE 1 TABLET BY MOUTH EVERY DAY (Patient not taking: No sig reported)    No facility-administered encounter medications on file as of 09/03/2020.   She continues on $RemoveBefo'10mg'sASRIrgsfGi$  prednisone daily.  Allergies reviewed.  ROS:  No fever, chills, URI symptoms, chest pain, shortness of breath.  No GI or GU complaints.  No muscle spasms or cramps.  Knee pain and swelling per HPI.   PHYSICAL EXAM:  BP 108/70   Pulse 80   Ht $R'5\' 3"'od$  (1.6 m)   Wt 171 lb 6.4 oz (  77.7 kg)   LMP 10/09/2015   BMI 30.36 kg/m   Well-appearing female in good spirits Knees: No visible swelling, appears symmetric.  No warmth Slight fullness behind L knee, nontender Some bruising noted at lower medial thigh on the left Mild crepitus bilaterally No pain with varus/valgus stress. FROM. Normal gait   ASSESSMENT/PLAN:  Acute pain of both knees - known OA on L per recent MRI, suspect OA in both knees. Unclear why acute onset swelling, had rapid resolution.  f/u with ortho if recurs/worsens  Hypokalemia - off potassium for over a month--recheck b-met to determine if it needs to be restarted - Plan: Basic metabolic panel  Medication monitoring encounter -  Plan: Basic metabolic panel    b-met If K is on the low side, then send in refill for potassium

## 2020-09-04 ENCOUNTER — Encounter: Payer: BC Managed Care – PPO | Admitting: Family Medicine

## 2020-09-18 ENCOUNTER — Ambulatory Visit: Payer: BC Managed Care – PPO | Admitting: Sports Medicine

## 2020-09-22 ENCOUNTER — Other Ambulatory Visit: Payer: Self-pay | Admitting: Family Medicine

## 2020-09-22 DIAGNOSIS — E7800 Pure hypercholesterolemia, unspecified: Secondary | ICD-10-CM

## 2020-09-30 ENCOUNTER — Encounter: Payer: Self-pay | Admitting: Family Medicine

## 2020-10-01 ENCOUNTER — Other Ambulatory Visit: Payer: Self-pay

## 2020-10-01 ENCOUNTER — Encounter: Payer: Self-pay | Admitting: Family Medicine

## 2020-10-01 ENCOUNTER — Ambulatory Visit: Payer: BC Managed Care – PPO | Admitting: Family Medicine

## 2020-10-01 VITALS — BP 110/70 | HR 80 | Temp 98.8°F | Ht 64.0 in | Wt 171.6 lb

## 2020-10-01 DIAGNOSIS — R3129 Other microscopic hematuria: Secondary | ICD-10-CM | POA: Diagnosis not present

## 2020-10-01 DIAGNOSIS — M545 Low back pain, unspecified: Secondary | ICD-10-CM

## 2020-10-01 DIAGNOSIS — R109 Unspecified abdominal pain: Secondary | ICD-10-CM

## 2020-10-01 LAB — POCT URINALYSIS DIP (PROADVANTAGE DEVICE)
Bilirubin, UA: NEGATIVE
Glucose, UA: NEGATIVE mg/dL
Ketones, POC UA: NEGATIVE mg/dL
Leukocytes, UA: NEGATIVE
Nitrite, UA: NEGATIVE
Protein Ur, POC: NEGATIVE mg/dL
Specific Gravity, Urine: 1.02
Urobilinogen, Ur: NEGATIVE
pH, UA: 6.5 (ref 5.0–8.0)

## 2020-10-01 NOTE — Patient Instructions (Signed)
We discussed the potential causes of your pain-- Your back pain might involve your kidney (ie potential stone), but also seems to have a muscular component. Be sure to drink plenty of water (which helps prevent and pass stones, if there are any).  Please try heat, stretches and massage to the back (you can use a heat patch like Thermacare if you need to for work.  Constipation may be a factor, and your fibroid uterus may also be a factor. Try taking a laxative this evening (such as Dulcolax or Correctol). If your discomfort in your left side of your stomach isn't at all better, the next step will be a CT scan.  Be sure to schedule your yearly exam with your GYN.

## 2020-10-01 NOTE — Progress Notes (Signed)
Chief Complaint  Patient presents with  . Back Pain    Left side low back pain on Monday. Front left side pain.   Patient presents with complaint of L low back pain, and L lower abdominal pain. Same pain she has been having for a while. Pain isn't daily, comes and goes.  Last evaluated for this in 07/2020. Then it was more of a short-lived shooting pain, now is more constant. Constant since Monday--not fluctuating, but not getting worse. Pain 5/10 currently. Hasn't tried any medications/treatments.  Pain is at the left lower back, starts in mid-upper lumbar area and radiates down some, and the RLQ.  No radiation into leg, no sciatica. No numbness, tingling, weakness. No urinary symptoms. Denies constipation.  Recalls being told stool on CT in the past.  Records in chart reviewed: MRI 06/2018 IMPRESSION: 1. No impingement to explain leg symptoms. No significant degenerative disease. 2. Mild levoscoliosis.  Has known fibroid uterus.  Sees Dr. Cletis Media, not seen in a year, due. Told she was menopausal.  +HF, night sweats.  CT 09/2015: IMPRESSION: Constipation. No evidence of bowel obstruction or inflammation. Normal appendix.  Enlarged myomatous uterus with small amount of fluid in the lower uterine or endocervical canal. Ultrasound may provide better evaluation of the pelvic structures.  PMH, PSH, SH reviewed  Outpatient Encounter Medications as of 10/01/2020  Medication Sig Note  . amLODipine (NORVASC) 5 MG tablet TAKE 1 TABLET BY MOUTH EVERY DAY   . atorvastatin (LIPITOR) 20 MG tablet TAKE 1 TABLET BY MOUTH EVERY DAY   . Cholecalciferol 25 MCG (1000 UT) capsule cholecalciferol (vitamin D3) 25 mcg (1,000 unit) capsule  TAKE 1 CAPSULE BY MOUTH EVERY DAY   . folic acid (FOLVITE) 1 MG tablet Take by mouth.   . metFORMIN (GLUCOPHAGE-XR) 500 MG 24 hr tablet Take 4 tablets in AM once a day for diabetes 05/07/2020: Still taking 1000mg  twice daily (states she didn't get the 500mg  ER rx  filled)  . methotrexate (RHEUMATREX) 2.5 MG tablet Take by mouth.   . olmesartan-hydrochlorothiazide (BENICAR HCT) 20-12.5 MG tablet TAKE 1 TABLET BY MOUTH EVERY DAY   . predniSONE (DELTASONE) 5 MG tablet Take 5 mg by mouth 2 (two) times daily. 04/14/2020: Takes 10mg  qam  . ACCU-CHEK GUIDE test strip  (Patient not taking: Reported on 10/01/2020)   . KLOR-CON M20 20 MEQ tablet TAKE 1 TABLET BY MOUTH EVERY DAY (Patient not taking: No sig reported)    No facility-administered encounter medications on file as of 10/01/2020.   Allergies reviewed in chart.   ROS:  No f/c, n/v/d No rashes/bleeding/bruising No vaginal bleeding, discharge. No heartburn. Denies constipation. Has been moving bowels, large caliber/amounts. Pain not changed after BM. No urinary complaints Has chronic microscopic hematuria   PHYSICAL EXAM:  BP 110/70   Pulse 80   Temp 98.8 F (37.1 C)   Ht 5\' 4"  (1.626 m)   Wt 171 lb 9.6 oz (77.8 kg)   LMP 10/09/2015   BMI 29.46 kg/m   Well-appearing, pleasant female, she is in good spirits and doesn't appear to be in any distress. Neck: no lymphadenopathy or mass Heart: regular rate and rhythm Lungs: clear bilaterally Back: no spinal or SI tenderness. Some tenderness at L CVA, and at the L paraspinous muscles in this area-, starting in upper lumbar region, extending to mid-lumbar area. Abdomen: soft, normal bowel sounds. Diffusely tender on the left side (LUQ down to the lower). No rebound tenderness or guarding, no masses Extremities: no  edema Neuro: alert and oriented, normal strength, gait. Negative SLR Psych: normal mood, affect, grooming  Urine dip: SG 1.020, moderate blood, otherwise clear.  ASSESSMENT/PLAN:   Left low back pain, unspecified chronicity, unspecified whether sciatica present - Definitely has a muscular component--heat, stretches, massage. May need CT to eval kidneys, if not improving - Plan: POCT Urinalysis DIP (Proadvantage  Device)  Abdominal pain, unspecified abdominal location - left sided (LUQ down to LLQ); Suspect poss constipation and fibroids contributing. CT if sx persist/worsen after trial of laxative  Microscopic hematuria - has been chronic. No h/o stones, but consider this dx with pain at L flank and spreading to L abdomen. CT if pain not improving   Given h/o constipation (noted on prior studies), and location of discomfort, discussed poss of constipation, fibroids contributing to discomfort, as well as poss of kidney stones (doubt--hematuria is chronic, no h/o stones). Encouraged increased hydration, trial of laxative.  To get CT if pain persists/worsens despite laxative use/results.  I spent 44 minutes dedicated to the care of this patient, including pre-visit review of records, face to face time, post-visit ordering of testing and documentation.   We discussed the potential causes of your pain-- Your back pain might involve your kidney (ie potential stone), but also seems to have a muscular component. Please try heat, stretches and massage to the back (you can use a heat patch like Thermacare if you need to for work.  Constipation may be a factor, and your fibroid uterus may also be a factor. Try taking a laxative this evening. If your discomfort in your left side of your stomach isn't at all better, the next step will be a CT scan.

## 2020-10-28 ENCOUNTER — Ambulatory Visit: Payer: BC Managed Care – PPO | Admitting: Family Medicine

## 2020-10-28 ENCOUNTER — Encounter: Payer: Self-pay | Admitting: Family Medicine

## 2020-10-28 ENCOUNTER — Other Ambulatory Visit: Payer: Self-pay

## 2020-10-28 VITALS — BP 102/68 | HR 78 | Temp 98.1°F | Ht 63.5 in | Wt 167.6 lb

## 2020-10-28 DIAGNOSIS — M940 Chondrocostal junction syndrome [Tietze]: Secondary | ICD-10-CM | POA: Diagnosis not present

## 2020-10-28 DIAGNOSIS — M948X9 Other specified disorders of cartilage, unspecified sites: Secondary | ICD-10-CM

## 2020-10-28 NOTE — Progress Notes (Signed)
   Subjective:    Patient ID: Michelle Mack, female    DOB: 16-Jul-1965, 55 y.o.   MRN: [id number]  HPI She states that on Sunday evening she developed some left-sided chest pressure with pain into the arm that lasted all night.  No associated shortness of breath, diaphoresis or weakness.  He has had previous difficulty with this and did have a cath done in 2020 which was negative.  She states that her breathing and motion makes the pain worse.  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   Review of Systems     Objective:   Physical Exam Alert and in no distress.  Cardiac exam shows regular rhythm without murmurs or gallops.  Lungs are clear to auscultation.  Some tenderness to palpation along the costochondral junction is noted.  Splinting of the chest wall and then breathing relieved her symptoms.       Assessment & Plan:  Acute costochondritis  Polychondritis I explained that I thought her symptoms were more costochondritis related but could also be related to the polychondritis.  Recommend 2 Tylenol 4 times per day and if continued difficulty discuss this further with her specialist.  She was comfortable with that.

## 2020-10-30 ENCOUNTER — Other Ambulatory Visit: Payer: Self-pay | Admitting: Family Medicine

## 2020-10-31 ENCOUNTER — Ambulatory Visit: Payer: BC Managed Care – PPO

## 2020-10-31 ENCOUNTER — Other Ambulatory Visit: Payer: Self-pay

## 2020-10-31 ENCOUNTER — Ambulatory Visit: Payer: BC Managed Care – PPO | Admitting: Podiatrist

## 2020-10-31 ENCOUNTER — Encounter: Payer: Self-pay | Admitting: Podiatrist

## 2020-10-31 DIAGNOSIS — L603 Nail dystrophy: Secondary | ICD-10-CM | POA: Diagnosis not present

## 2020-10-31 DIAGNOSIS — Z1211 Encounter for screening for malignant neoplasm of colon: Secondary | ICD-10-CM | POA: Insufficient documentation

## 2020-10-31 DIAGNOSIS — J40 Bronchitis, not specified as acute or chronic: Secondary | ICD-10-CM | POA: Insufficient documentation

## 2020-10-31 DIAGNOSIS — M205X1 Other deformities of toe(s) (acquired), right foot: Secondary | ICD-10-CM | POA: Diagnosis not present

## 2020-10-31 DIAGNOSIS — K644 Residual hemorrhoidal skin tags: Secondary | ICD-10-CM | POA: Insufficient documentation

## 2020-10-31 DIAGNOSIS — K5900 Constipation, unspecified: Secondary | ICD-10-CM | POA: Insufficient documentation

## 2020-10-31 DIAGNOSIS — L601 Onycholysis: Secondary | ICD-10-CM | POA: Diagnosis not present

## 2020-10-31 DIAGNOSIS — N393 Stress incontinence (female) (male): Secondary | ICD-10-CM | POA: Insufficient documentation

## 2020-10-31 DIAGNOSIS — M79674 Pain in right toe(s): Secondary | ICD-10-CM

## 2020-10-31 DIAGNOSIS — R1013 Epigastric pain: Secondary | ICD-10-CM | POA: Insufficient documentation

## 2020-10-31 DIAGNOSIS — L608 Other nail disorders: Secondary | ICD-10-CM | POA: Diagnosis not present

## 2020-10-31 DIAGNOSIS — K589 Irritable bowel syndrome without diarrhea: Secondary | ICD-10-CM | POA: Insufficient documentation

## 2020-10-31 DIAGNOSIS — M21611 Bunion of right foot: Secondary | ICD-10-CM | POA: Diagnosis not present

## 2020-10-31 DIAGNOSIS — R141 Gas pain: Secondary | ICD-10-CM | POA: Insufficient documentation

## 2020-10-31 DIAGNOSIS — N952 Postmenopausal atrophic vaginitis: Secondary | ICD-10-CM | POA: Insufficient documentation

## 2020-10-31 DIAGNOSIS — D509 Iron deficiency anemia, unspecified: Secondary | ICD-10-CM | POA: Insufficient documentation

## 2020-10-31 DIAGNOSIS — K625 Hemorrhage of anus and rectum: Secondary | ICD-10-CM | POA: Insufficient documentation

## 2020-10-31 DIAGNOSIS — B351 Tinea unguium: Secondary | ICD-10-CM

## 2020-10-31 DIAGNOSIS — M533 Sacrococcygeal disorders, not elsewhere classified: Secondary | ICD-10-CM | POA: Insufficient documentation

## 2020-10-31 DIAGNOSIS — R143 Flatulence: Secondary | ICD-10-CM | POA: Insufficient documentation

## 2020-10-31 NOTE — Progress Notes (Signed)
  Chief Complaint  Patient presents with  . Nail Problem    Reports around 2nd rt nail being very sensitive and sore. Reports injury/cut on toe about 8 years ago and sensations to toe havent been the same since. Denies signs of infection.      HPI: Patient is 55 y.o. female who presents today for the concerns as listed above. She relates pain in right second toe at the tip of the toe itself where she feels a callus present as well as underneath the toenail where there is thickening and buildup.  She saw Dr. Cannon Kettle who also evaluated the toe and discussed she would be a candidate for surgical correction.  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    Past medical history, medications and allergies are reviewed.   Physical Exam  GENERAL APPEARANCE: Alert, conversant. Appropriately groomed. No acute distress.   VASCULAR: Pedal pulses palpable DP and PT bilateral.  Capillary refill time is immediate to all digits,  Proximal to distal cooling it warm to warm.  Digital perfusion adequate.   NEUROLOGIC: sensation is intact to 5.07 monofilament at 5/5 sites bilateral.  Light touch is intact bilateral, vibratory sensation intact bilateral  MUSCULOSKELETAL: acceptable muscle strength, tone and stability bilateral. Right foot has a notable bunion deformity and  hallux abduction deformity.  She has a long second toe in comparison with the remainder of the digits. The second toe is also plantarflexed at the distal ip joint upon standing.    DERMATOLOGIC: skin is warm, supple, and dry.  No open lesions noted.  Callus is present at the distal tip of the right second toe.  There is thickness and chalky substance beneath the nail.  Likely due to repeated microtrauma but possibly a fungal infection in the nail.    Assessment     ICD-10-CM   1. Contracture of toe of right foot  M20.5X1   2. Nail dystrophy  L60.3   3. Bunion, right  M21.611       Plan -Patient examined and xrays from previous visit were evaluated. - I discussed I agree with Dr. Delia Chimes recommendation that the second toe is long and the bunion is pressing on her second toe resulting in the mechanical irritation at the second toe.   -because the toenail is thick and bothersome to her I took a sample of the nail to send to [facility] for analysis.  I will notify her of the result and will decide if treatment is indicated based on the result of the study. - she will consider surgical correction and she will call about a month prior to when she would be interested in surgery.  - I trimmed the callus away and trimmed the tip of the toenail although I doubt this will help much with her discomfort.  - patient demonstrates an understanding of this conversation.

## 2020-10-31 NOTE — Patient Instructions (Signed)
Fungal Nail Infection A fungal nail infection is a common infection of the toenails or fingernails. This condition affects toenails more often than fingernails. It often affects the great, or big, toes. More than one nail may be infected. The condition can be passed from person to person (is contagious). What are the causes? This condition is caused by a fungus. Several types of fungi can cause the infection. These fungi are common in moist and warm areas. If your hands or feet come into contact with the fungus, it may get into a crack in your fingernail or toenail and cause the infection. What increases the risk? The following factors may make you more likely to develop this condition:  Being female.  Being of older age.  Living with someone who has the fungus.  Walking barefoot in areas where the fungus thrives, such as showers or locker rooms.  Wearing shoes and socks that cause your feet to sweat.  Having a nail injury or a recent nail surgery.  Having certain medical conditions, such as: ? Athlete's foot. ? Diabetes. ? Psoriasis. ? Poor circulation. ? A weak body defense system (immune system). What are the signs or symptoms? Symptoms of this condition include:  A pale spot on the nail.  Thickening of the nail.  A nail that becomes yellow or brown.  A brittle or ragged nail edge.  A crumbling nail.  A nail that has lifted away from the nail bed.   How is this diagnosed? This condition is diagnosed with a physical exam. Your health care provider may take a scraping or clipping from your nail to test for the fungus. How is this treated? Treatment is not needed for mild infections. If you have significant nail changes, treatment may include:  Antifungal medicines taken by mouth (orally). You may need to take the medicine for several weeks or several months, and you may not see the results for a long time. These medicines can cause side effects. Ask your health care  provider what problems to watch for.  Antifungal nail polish or nail cream. These may be used along with oral antifungal medicines.  Laser treatment of the nail.  Surgery to remove the nail. This may be needed for the most severe infections. It can take a long time, usually up to a year, for the infection to go away. The infection may also come back.   Follow these instructions at home: Medicines  Take or apply over-the-counter and prescription medicines only as told by your health care provider.  Ask your health care provider about using over-the-counter mentholated ointment on your nails. Nail care  Trim your nails often.  Wash and dry your hands and feet every day.  Keep your feet dry: ? Wear absorbent socks, and change your socks frequently. ? Wear shoes that allow air to circulate, such as sandals or canvas tennis shoes. Throw out old shoes.  Do not use artificial nails.  If you go to a nail salon, make sure you choose one that uses clean instruments.  Use antifungal foot powder on your feet and in your shoes. General instructions  Do not share personal items, such as towels or nail clippers.  Do not walk barefoot in shower rooms or locker rooms.  Wear rubber gloves if you are working with your hands in wet areas.  Keep all follow-up visits as told by your health care provider. This is important. Contact a health care provider if: Your infection is not getting better or   it is getting worse after several months. Summary  A fungal nail infection is a common infection of the toenails or fingernails.  Treatment is not needed for mild infections. If you have significant nail changes, treatment may include taking medicine orally and applying medicine to your nails.  It can take a long time, usually up to a year, for the infection to go away. The infection may also come back.  Take or apply over-the-counter and prescription medicines only as told by your health care  provider.  Follow instructions for taking care of your nails to help prevent infection from coming back or spreading. This information is not intended to replace advice given to you by your health care provider. Make sure you discuss any questions you have with your health care provider. Document Revised: 10/12/2018 Document Reviewed: 11/25/2017 Elsevier Patient Education  2021 [employer]  

## 2020-11-19 ENCOUNTER — Telehealth: Payer: Self-pay | Admitting: Podiatrist

## 2020-11-19 NOTE — Telephone Encounter (Signed)
I called patient and let her know her nail sample is negative for fungus.  I recommended trying a larger shoe to remove the irritation from the end of the shoe on the toe.  I recommended [employer] for her to go to for a proper sizing and shoe fitting.  She will try this and see if it helps.

## 2020-11-20 ENCOUNTER — Encounter: Payer: Self-pay | Admitting: Family Medicine

## 2020-11-21 ENCOUNTER — Other Ambulatory Visit: Payer: Self-pay | Admitting: Family Medicine

## 2020-11-21 DIAGNOSIS — E559 Vitamin D deficiency, unspecified: Secondary | ICD-10-CM

## 2020-11-21 NOTE — Telephone Encounter (Signed)
Please advise due to this may be coming from another doctor. [place]

## 2020-11-23 NOTE — Progress Notes (Signed)
Chief Complaint  Patient presents with  . Hypertension    Fasting med check. Had eye exam with Dr. Macarthur Critchley last month, I will get. Has not yet had knee surgery. Did sleep study that pulm ordered a long time ago-also said she had people come out to the house with the machines and they said everything was fine and she did not need treatment.   Marland Kitchen PPD Reading    Needs quantiferon gold today for new job. Had one December 12, 2019 but would like a more updated one.   . Immunizations    Will come back for second covid booster.     Chest wall pain--saw Dr. Redmond School last month with chest wall pain.  Felt to be MSK (due to costochondritis vs polychondritis), EKG wasn't concerning and patient was reassured. Pain resolved. She reports she had increased her prednisone, but is now back on 10mg  daily.  Patient had issues with LBP in January, through March.  Last seen for this 3/30 at which time pain was at the left lower back, starts in mid-upper lumbar area and radiates down some, and the RLQ.  No radiation into leg, no sciatica. No numbness, tingling, weakness. The back pain was felt to be muscular. The abdominal pain (on exam she had LEFT sided pain, LUQ down to LLQ) was felt to possibly be related to constipation, fibroids might contribute. Recommended CT if symptoms persisted/worsened, after a trial of laxative. She reports that her back and abdominal pain have resolved.  Hypertension: She is currently taking 5mg  amlodipine and Benicar HCT (20-12.5).  The benicar was added (prev took HCTZ 25mg ) in 06/2020.  Potassium supplement was stopped when changed to Benicar HCT, and potassium was normal on check after starting ARB, in 07/2020.  BP'sare not checked elsewhere, just other doctors.She denies exertional chest pain, palpitations, exertional dyspnea, edema, headaches, dizziness. BP Readings from Last 3 Encounters:  10/28/20 102/68  10/01/20 110/70  09/03/20 108/70    Diabetes: She is under the care  of Dr. Elyse Hsu, last seen in 07/2020. She had labs done through their office in 06/2020. Last A1c was 7.9% She is currently on 2000mg  of metformin daily. At their last visit, they discussed resuming Farxiga, new rx sent in, but pt declined to restart this (caused vaginitis and UTI in the past). She also didn't want injections, so didn't elect to restart Trulicity. So, no changes were made to try and improve diabetes control.  Sugars are 116-120, max of 130, at various times of the day, though admits she only checks her sugar about once a week.  Denies polydipsia, polyuria, hypoglycemia. She remains on chronic prednisone at 10mg  daily. Shepreviouslywas prescribedFarxiga, stopped due to vaginitis and UTI.Also previously prescribed Tradjenta, stopped due to diarrhea. Previously prescribed Trulicity (unclear why that was stopped, per pt, subsequently stated not liking injections).  She sees podiatrist, went recently (having issues related to 2nd toe being "too long".) Last eye exam was about a month ago (no report received).  Hyperlipidemia: She reports compliance with atorvastatin, denies side effects. [facility]were [facility] and were at goal, LDL 59. She had cardiac cath in 07/2018 after hospitalized with chest pain. She had normal coronary arteries.   Vitamin D deficiency:She is compliant with taking D3 daily.Last level was:37 in 06/2020 at [facility].  Tracheomalacia, relapsing polychondritis, chronic cough and OSA--under the care of Dr. Early Osmond, last seen in 12/2019, scheduled for next month. She continues on Prednisone and methotrexate.  OSA: She hasn't been using  CPAP (for >5 years).  Her pulmonologist ordered a home sleep study.  At the time of her physical, she hadn't received a report.  I have no access to report through [place].  She has f/u with pulmonologist next month. She was told after her last sleep study that it was fine.  Plans on opening on a daycare.   Needs Quantiferon  PMH, PSH, SH reviewed  Outpatient Encounter Medications as of 11/24/2020  Medication Sig Note  . ACCU-CHEK GUIDE test strip    . amLODipine (NORVASC) 5 MG tablet TAKE 1 TABLET BY MOUTH EVERY DAY   . atorvastatin (LIPITOR) 20 MG tablet TAKE 1 TABLET BY MOUTH EVERY DAY   . Cholecalciferol 25 MCG (1000 UT) capsule cholecalciferol (vitamin D3) 25 mcg (1,000 unit) capsule  TAKE 1 CAPSULE BY MOUTH EVERY DAY   . folic acid (FOLVITE) 1 MG tablet Take by mouth.   . metFORMIN (GLUCOPHAGE-XR) 500 MG 24 hr tablet Take 4 tablets in AM once a day for diabetes 05/07/2020: Still taking 1000mg  twice daily (states she didn't get the 500mg  ER rx filled)  . methotrexate (RHEUMATREX) 2.5 MG tablet Take by mouth.   . olmesartan-hydrochlorothiazide (BENICAR HCT) 20-12.5 MG tablet TAKE 1 TABLET BY MOUTH EVERY DAY   . predniSONE (DELTASONE) 5 MG tablet Take 5 mg by mouth 2 (two) times daily. 04/14/2020: Takes 10mg  qam  . [DISCONTINUED] CVS D3 50 MCG (2000 UT) CAPS Take 2,000 Units by mouth daily.   Marland Kitchen FARXIGA 10 MG TABS tablet Take 10 mg by mouth daily. (Patient not taking: Reported on 11/24/2020)   . [DISCONTINUED] dapagliflozin propanediol (FARXIGA) 10 MG TABS tablet Take 1 tablet before breakfast once a day for diabetes (Patient not taking: Reported on 11/24/2020)   . [DISCONTINUED] KLOR-CON M20 20 MEQ tablet TAKE 1 TABLET BY MOUTH EVERY DAY (Patient not taking: No sig reported)   . [DISCONTINUED] scopolamine (TRANSDERM-SCOP) 1 MG/3DAYS Transderm-Scop 1 mg over 3 days transdermal patch (Patient not taking: Reported on 11/24/2020)    No facility-administered encounter medications on file as of 11/24/2020.   Allergies reviewed.  ROS: no fever, chills, URI symptoms. No urinary complaints. Breathing stable.  No bleeding. No exertional chest pain or shortness of breath. Back pain, abdominal pain resolved. Chest wall pain resolved L knee pain persists. Also having some pain in feet, for which she  recently saw podiatrist.   PHYSICAL EXAM:  BP 100/60   Pulse 72   Ht 5\' 4"  (1.626 m)   Wt 170 lb 12.8 oz (77.5 kg)   LMP 10/09/2015   BMI 29.32 kg/m   Wt Readings from Last 3 Encounters:  11/24/20 170 lb 12.8 oz (77.5 kg)  10/28/20 167 lb 9.6 oz (76 kg)  10/01/20 171 lb 9.6 oz (77.8 kg)   Pleasant, well-appearing female in good spirits, in no distress HEENT: conjunctiva and sclera are clear, EOMI, wearing mask Neck: no lymphadenopathy or mass, no bruit Heart: regular rate and rhythm, no murmur Lungs: clear bilaterally Abdomen: soft, nontender, no mass Back: no spinal or CVA tenderness Extremities: no edema, normal pulses. Wearing brace at L knee Psych: normal mood, affect, hygiene and grooming Neuro: alert and oriented, normal gait, gross strength    ASSESSMENT/PLAN:  Type 2 diabetes mellitus with stage 2 chronic kidney disease, without long-term current use of insulin (HCC) - last A1c above goal, no med changes made (pt declined restarting either of 2 she didn't tolerate). Suggested Rybelsus, to d/w endo  Vitamin D deficiency -  cont daily supplement  Relapsing polychondritis - stable, requiring chronic prednisone  OSA (obstructive sleep apnea) - she had repeat sleep study, report n/a to me.  Pt to verify normal result (vs need to restart CPAP)  Pure hypercholesterolemia - at goal per 06/2020 labs, cont statin - Plan: atorvastatin (LIPITOR) 20 MG tablet  Essential hypertension, benign - BP controlled on current regimen - Plan: amLODipine (NORVASC) 5 MG tablet  Screening-pulmonary TB - Plan: QuantiFERON-TB Gold Plus   Will get DM eye exam from Dr. Nicki Reaper. Recommended 2nd COVID booster, will return for this.  No labs needed--all done by other providers.   F/u as scheduled for CPE in 05/2021   Double check with your pulmonary doctors whether or not you are supposed to be using CPAP for treatment of sleep apnea. I do not have any results from the sleep study, so I  can't offer recommendations just want to verify that there isn't a misunderstanding.   Sounds like perhaps the second sleep study was normal, and there wasn't an indication to start treatment again, but I don't have these results to confirm (just based on what you told me).  I think you should discuss your diabetes medications with Dr. Elyse Hsu.  If your A1c remains above 7 (it was 7.9% on last check in December), then consider Rybelsus, which is an ORAL medication, in the same class as Trulicity (without having to give yourself injections). We also discussed that if you restarted Jardiance, to have Diflucan on hand as needed, and reassured you that the yeast infections likely will not be longterm, usually just a few early on, if any. Please monitor your sugars more frequently and bring your list of sugars to your visit with Dr. Elyse Hsu.

## 2020-11-24 ENCOUNTER — Ambulatory Visit: Payer: BC Managed Care – PPO | Admitting: Family Medicine

## 2020-11-24 ENCOUNTER — Ambulatory Visit: Payer: BC Managed Care – PPO

## 2020-11-24 ENCOUNTER — Other Ambulatory Visit: Payer: Self-pay

## 2020-11-24 ENCOUNTER — Encounter: Payer: Self-pay | Admitting: Family Medicine

## 2020-11-24 VITALS — BP 100/60 | HR 72 | Ht 64.0 in | Wt 170.8 lb

## 2020-11-24 DIAGNOSIS — G4733 Obstructive sleep apnea (adult) (pediatric): Secondary | ICD-10-CM

## 2020-11-24 DIAGNOSIS — M941 Relapsing polychondritis: Secondary | ICD-10-CM

## 2020-11-24 DIAGNOSIS — E7800 Pure hypercholesterolemia, unspecified: Secondary | ICD-10-CM

## 2020-11-24 DIAGNOSIS — Z111 Encounter for screening for respiratory tuberculosis: Secondary | ICD-10-CM | POA: Diagnosis not present

## 2020-11-24 DIAGNOSIS — E559 Vitamin D deficiency, unspecified: Secondary | ICD-10-CM | POA: Diagnosis not present

## 2020-11-24 DIAGNOSIS — N182 Chronic kidney disease, stage 2 (mild): Secondary | ICD-10-CM

## 2020-11-24 DIAGNOSIS — I10 Essential (primary) hypertension: Secondary | ICD-10-CM

## 2020-11-24 DIAGNOSIS — E1122 Type 2 diabetes mellitus with diabetic chronic kidney disease: Secondary | ICD-10-CM | POA: Diagnosis not present

## 2020-11-24 MED ORDER — AMLODIPINE BESYLATE 5 MG PO TABS: 1.0000 | ORAL_TABLET | Freq: Every day | ORAL | 1 refills | Status: DC

## 2020-11-24 MED ORDER — ATORVASTATIN CALCIUM 20 MG PO TABS: 1.0000 | ORAL_TABLET | Freq: Every day | ORAL | 1 refills | Status: DC

## 2020-11-24 NOTE — Patient Instructions (Signed)
Double check with your pulmonary doctors whether or not you are supposed to be using CPAP for treatment of sleep apnea. I do not have any results from the sleep study, so I can't offer recommendations just want to verify that there isn't a misunderstanding.   Sounds like perhaps the second sleep study was normal, and there wasn't an indication to start treatment again, but I don't have these results to confirm (just based on what you told me).  I think you should discuss your diabetes medications with Dr. Elyse Hsu.  If your A1c remains above 7 (it was 7.9% on last check in December), then consider Rybelsus, which is an ORAL medication, in the same class as Trulicity (without having to give yourself injections). We also discussed that if you restarted Jardiance, to have Diflucan on hand as needed, and reassured you that the yeast infections likely will not be longterm, usually just a few early on, if any. Please monitor your sugars more frequently and bring your list of sugars to your visit with Dr. Elyse Hsu.   Insomnia Insomnia is a sleep disorder that makes it difficult to fall asleep or stay asleep. Insomnia can cause fatigue, low energy, difficulty concentrating, mood swings, and poor performance at work or school. There are three different ways to classify insomnia:  Difficulty falling asleep.  Difficulty staying asleep.  Waking up too early in the morning. Any type of insomnia can be long-term (chronic) or short-term (acute). Both are common. Short-term insomnia usually lasts for three months or less. Chronic insomnia occurs at least three times a week for longer than three months. What are the causes? Insomnia may be caused by another condition, situation, or substance, such as:  Anxiety.  Certain medicines.  Gastroesophageal reflux disease (GERD) or other gastrointestinal conditions.  Asthma or other breathing conditions.  Restless legs syndrome, sleep apnea, or other sleep  disorders.  Chronic pain.  Menopause.  Stroke.  Abuse of alcohol, tobacco, or illegal drugs.  Mental health conditions, such as depression.  Caffeine.  Neurological disorders, such as Alzheimer's disease.  An overactive thyroid (hyperthyroidism). Sometimes, the cause of insomnia may not be known. What increases the risk? Risk factors for insomnia include:  Gender. Women are affected more often than men.  Age. Insomnia is more common as you get older.  Stress.  Lack of exercise.  Irregular work schedule or working night shifts.  Traveling between different time zones.  Certain medical and mental health conditions. What are the signs or symptoms? If you have insomnia, the main symptom is having trouble falling asleep or having trouble staying asleep. This may lead to other symptoms, such as:  Feeling fatigued or having low energy.  Feeling nervous about going to sleep.  Not feeling rested in the morning.  Having trouble concentrating.  Feeling irritable, anxious, or depressed. How is this diagnosed? This condition may be diagnosed based on:  Your symptoms and medical history. Your health care provider may ask about: ? Your sleep habits. ? Any medical conditions you have. ? Your mental health.  A physical exam. How is this treated? Treatment for insomnia depends on the cause. Treatment may focus on treating an underlying condition that is causing insomnia. Treatment may also include:  Medicines to help you sleep.  Counseling or therapy.  Lifestyle adjustments to help you sleep better. Follow these instructions at home: Eating and drinking  Limit or avoid alcohol, caffeinated beverages, and cigarettes, especially close to bedtime. These can disrupt your sleep.  Do  not eat a large meal or eat spicy foods right before bedtime. This can lead to digestive discomfort that can make it hard for you to sleep.   Sleep habits  Keep a sleep diary to help you  and your health care provider figure out what could be causing your insomnia. Write down: ? When you sleep. ? When you wake up during the night. ? How well you sleep. ? How rested you feel the next day. ? Any side effects of medicines you are taking. ? What you eat and drink.  Make your bedroom a dark, comfortable place where it is easy to fall asleep. ? Put up shades or blackout curtains to block light from outside. ? Use a white noise machine to block noise. ? Keep the temperature cool.  Limit screen use before bedtime. This includes: ? Watching TV. ? Using your smartphone, tablet, or computer.  Stick to a routine that includes going to bed and waking up at the same times every day and night. This can help you fall asleep faster. Consider making a quiet activity, such as reading, part of your nighttime routine.  Try to avoid taking naps during the day so that you sleep better at night.  Get out of bed if you are still awake after 15 minutes of trying to sleep. Keep the lights down, but try reading or doing a quiet activity. When you feel sleepy, go back to bed.   General instructions  Take over-the-counter and prescription medicines only as told by your health care provider.  Exercise regularly, as told by your health care provider. Avoid exercise starting several hours before bedtime.  Use relaxation techniques to manage stress. Ask your health care provider to suggest some techniques that may work well for you. These may include: ? Breathing exercises. ? Routines to release muscle tension. ? Visualizing peaceful scenes.  Make sure that you drive carefully. Avoid driving if you feel very sleepy.  Keep all follow-up visits as told by your health care provider. This is important. Contact a health care provider if:  You are tired throughout the day.  You have trouble in your daily routine due to sleepiness.  You continue to have sleep problems, or your sleep problems get  worse. Get help right away if:  You have serious thoughts about hurting yourself or someone else. If you ever feel like you may hurt yourself or others, or have thoughts about taking your own life, get help right away. You can go to your nearest emergency department or call:  Your local emergency services (911 in the [place]  A suicide crisis helpline, such as the [place] [facility] at 671-496-9284. This is open 24 hours a day. Summary  Insomnia is a sleep disorder that makes it difficult to fall asleep or stay asleep.  Insomnia can be long-term (chronic) or short-term (acute).  Treatment for insomnia depends on the cause. Treatment may focus on treating an underlying condition that is causing insomnia.  Keep a sleep diary to help you and your health care provider figure out what could be causing your insomnia. This information is not intended to replace advice given to you by your health care provider. Make sure you discuss any questions you have with your health care provider. Document Revised: 05/01/2020 Document Reviewed: 05/01/2020 Elsevier Patient Education  2021 [employer].

## 2020-11-26 ENCOUNTER — Encounter: Payer: Self-pay | Admitting: Family Medicine

## 2020-11-26 ENCOUNTER — Encounter: Payer: Self-pay | Admitting: *Deleted

## 2020-11-26 DIAGNOSIS — E113293 Type 2 diabetes mellitus with mild nonproliferative diabetic retinopathy without macular edema, bilateral: Secondary | ICD-10-CM | POA: Insufficient documentation

## 2020-11-27 LAB — QUANTIFERON-TB GOLD PLUS
QuantiFERON Mitogen Value: >10 IU/mL
QuantiFERON Nil Value: 0.01 IU/mL
QuantiFERON TB1 Ag Value: 0 IU/mL
QuantiFERON TB2 Ag Value: 0.02 IU/mL
QuantiFERON-TB Gold Plus: NEGATIVE

## 2020-12-10 ENCOUNTER — Other Ambulatory Visit: Payer: Self-pay | Admitting: Family Medicine

## 2020-12-10 DIAGNOSIS — E559 Vitamin D deficiency, unspecified: Secondary | ICD-10-CM

## 2020-12-10 NOTE — Telephone Encounter (Signed)
Are you okay with this being sent to the pharmacy?

## 2020-12-10 NOTE — Telephone Encounter (Signed)
Called patient to ask which strength she was on, got disconnected and tried to call her back-no answer. Will try again.

## 2020-12-10 NOTE — Telephone Encounter (Signed)
Spoke with patient and she will call me tomorrow to confirm what she takes-she was not at home.

## 2020-12-10 NOTE — Telephone Encounter (Signed)
I have that her last level was normal at 37 in 06/2020 through [facility]. We have that she is taking 81mcg (1000 IU daily). Please contact pt, as this is a different dose than what she has been taking (?)

## 2020-12-11 NOTE — Telephone Encounter (Signed)
Spoke with patient and she is taking 2,000iu-she read it off the bottle to me-said this is what she has always been taking. Looks like in her med history we have called this in before. Is this okay to send?

## 2020-12-22 DIAGNOSIS — E1165 Type 2 diabetes mellitus with hyperglycemia: Secondary | ICD-10-CM | POA: Diagnosis not present

## 2020-12-22 DIAGNOSIS — Z79899 Other long term (current) drug therapy: Secondary | ICD-10-CM | POA: Diagnosis not present

## 2020-12-22 DIAGNOSIS — E7800 Pure hypercholesterolemia, unspecified: Secondary | ICD-10-CM | POA: Diagnosis not present

## 2020-12-23 DIAGNOSIS — M1711 Unilateral primary osteoarthritis, right knee: Secondary | ICD-10-CM | POA: Diagnosis not present

## 2020-12-23 DIAGNOSIS — J988 Other specified respiratory disorders: Secondary | ICD-10-CM | POA: Diagnosis not present

## 2020-12-23 DIAGNOSIS — M941 Relapsing polychondritis: Secondary | ICD-10-CM | POA: Diagnosis not present

## 2020-12-23 DIAGNOSIS — R768 Other specified abnormal immunological findings in serum: Secondary | ICD-10-CM | POA: Diagnosis not present

## 2020-12-23 DIAGNOSIS — Z79899 Other long term (current) drug therapy: Secondary | ICD-10-CM | POA: Diagnosis not present

## 2020-12-27 DIAGNOSIS — M25561 Pain in right knee: Secondary | ICD-10-CM | POA: Diagnosis not present

## 2020-12-29 ENCOUNTER — Other Ambulatory Visit: Payer: Self-pay

## 2020-12-29 ENCOUNTER — Other Ambulatory Visit (HOSPITAL_COMMUNITY): Payer: Self-pay | Admitting: Sports Medicine

## 2020-12-29 ENCOUNTER — Ambulatory Visit (HOSPITAL_COMMUNITY)
Admission: RE | Admit: 2020-12-29 | Discharge: 2020-12-29 | Disposition: A | Discharge status: Home / Self Care | Payer: BC Managed Care – PPO | Source: Ambulatory Visit | Attending: Cardiology | Admitting: Cardiology

## 2020-12-29 DIAGNOSIS — M79604 Pain in right leg: Secondary | ICD-10-CM

## 2020-12-29 DIAGNOSIS — M25561 Pain in right knee: Secondary | ICD-10-CM | POA: Diagnosis not present

## 2020-12-30 DIAGNOSIS — M25561 Pain in right knee: Secondary | ICD-10-CM | POA: Diagnosis not present

## 2021-01-03 ENCOUNTER — Ambulatory Visit (HOSPITAL_COMMUNITY)
Admission: EM | Admit: 2021-01-03 | Discharge: 2021-01-03 | Disposition: A | Discharge status: Home / Self Care | Payer: BC Managed Care – PPO

## 2021-01-03 ENCOUNTER — Other Ambulatory Visit: Payer: Self-pay

## 2021-01-06 ENCOUNTER — Other Ambulatory Visit: Payer: Self-pay | Admitting: Family Medicine

## 2021-01-08 DIAGNOSIS — M25561 Pain in right knee: Secondary | ICD-10-CM | POA: Diagnosis not present

## 2021-01-22 DIAGNOSIS — M1711 Unilateral primary osteoarthritis, right knee: Secondary | ICD-10-CM | POA: Diagnosis not present

## 2021-02-26 ENCOUNTER — Encounter (HOSPITAL_BASED_OUTPATIENT_CLINIC_OR_DEPARTMENT_OTHER): Payer: Self-pay

## 2021-02-26 ENCOUNTER — Ambulatory Visit (HOSPITAL_BASED_OUTPATIENT_CLINIC_OR_DEPARTMENT_OTHER): Admit: 2021-02-26 | Payer: BC Managed Care – PPO | Admitting: Orthopaedic Surgery

## 2021-02-26 DIAGNOSIS — M1711 Unilateral primary osteoarthritis, right knee: Secondary | ICD-10-CM | POA: Diagnosis not present

## 2021-02-26 SURGERY — ARTHROSCOPY, KNEE, WITH LATERAL MENISCECTOMY
Anesthesia: Choice | Site: Knee | Laterality: Right

## 2021-03-13 DIAGNOSIS — M1711 Unilateral primary osteoarthritis, right knee: Secondary | ICD-10-CM | POA: Diagnosis not present

## 2021-03-16 DIAGNOSIS — E559 Vitamin D deficiency, unspecified: Secondary | ICD-10-CM | POA: Diagnosis not present

## 2021-03-16 DIAGNOSIS — N182 Chronic kidney disease, stage 2 (mild): Secondary | ICD-10-CM | POA: Diagnosis not present

## 2021-03-18 DIAGNOSIS — D649 Anemia, unspecified: Secondary | ICD-10-CM | POA: Diagnosis not present

## 2021-03-18 DIAGNOSIS — I10 Essential (primary) hypertension: Secondary | ICD-10-CM | POA: Diagnosis not present

## 2021-03-18 DIAGNOSIS — N182 Chronic kidney disease, stage 2 (mild): Secondary | ICD-10-CM | POA: Diagnosis not present

## 2021-03-18 DIAGNOSIS — E559 Vitamin D deficiency, unspecified: Secondary | ICD-10-CM | POA: Diagnosis not present

## 2021-04-06 ENCOUNTER — Telehealth: Payer: Self-pay | Admitting: *Deleted

## 2021-04-06 NOTE — Telephone Encounter (Signed)
Patient advised and I will get notes and labs.

## 2021-04-06 NOTE — Telephone Encounter (Signed)
I'm assuming that Dr. Posey Pronto did bloodwork, or saw results where her potassium was on the low side.  I looked back at the last few potassiums, and it was all normal, never high.  I trust Dr. Posey Pronto to do the right thing--have them send [place] notes and any labs done.

## 2021-04-06 NOTE — Telephone Encounter (Signed)
Patient called, saw Dr. Posey Pronto (nephro) today and she was put back on potassium-given KlorCon EF 25 meq(effervescent). She wanted to make sure there wasn't any reason she should not take potassium since you took her off of it 05/2020.

## 2021-04-15 DIAGNOSIS — R399 Unspecified symptoms and signs involving the genitourinary system: Secondary | ICD-10-CM | POA: Diagnosis not present

## 2021-04-15 DIAGNOSIS — N898 Other specified noninflammatory disorders of vagina: Secondary | ICD-10-CM | POA: Diagnosis not present

## 2021-04-23 ENCOUNTER — Telehealth: Payer: Self-pay | Admitting: Family Medicine

## 2021-04-23 ENCOUNTER — Encounter: Payer: Self-pay | Admitting: Family Medicine

## 2021-04-23 ENCOUNTER — Ambulatory Visit: Payer: BC Managed Care – PPO | Admitting: Family Medicine

## 2021-04-23 ENCOUNTER — Other Ambulatory Visit: Payer: Self-pay

## 2021-04-23 VITALS — BP 110/62 | HR 76 | Temp 99.0°F | Ht 63.5 in | Wt 169.0 lb

## 2021-04-23 DIAGNOSIS — M545 Low back pain, unspecified: Secondary | ICD-10-CM

## 2021-04-23 DIAGNOSIS — N12 Tubulo-interstitial nephritis, not specified as acute or chronic: Secondary | ICD-10-CM

## 2021-04-23 LAB — POCT URINALYSIS DIP (PROADVANTAGE DEVICE)
Bilirubin, UA: NEGATIVE
Glucose, UA: NEGATIVE mg/dL
Ketones, POC UA: NEGATIVE mg/dL
Nitrite, UA: NEGATIVE
Protein Ur, POC: NEGATIVE mg/dL
Specific Gravity, Urine: 1.025
Urobilinogen, Ur: NEGATIVE
pH, UA: 6 (ref 5.0–8.0)

## 2021-04-23 MED ORDER — CIPROFLOXACIN HCL 500 MG PO TABS
500.0000 mg | ORAL_TABLET | Freq: Two times a day (BID) | ORAL | 0 refills | Status: AC
Start: 2021-04-23 — End: 2021-04-28

## 2021-04-23 NOTE — Progress Notes (Signed)
Chief Complaint  Patient presents with   Back Pain    Low back pain and she feels like when she urinates it "just comes out forceful." LBP just started yesterday. Urinary complaint started around 04/15/21. Does not want covid booster or flu shot today has funeral to go to this weekend.     Last week she noted the urine seeming more forceful, but not large amounts.  No associated burning or pain with urination.  It improved a little bit, but yesterday she started having L sided LBP. Heat last night didn't really help.  +urinary urgency and frequency, no dysuria.  No f/c/n/v/d Sugars have been good, not any higher than usual  Denies any vaginal bleeding, spotting or abnormal vaginal discharge  No h/o kidney stones.  PMH, PSH, SH reviewed Her father recently passed away  Outpatient Encounter Medications as of 04/23/2021  Medication Sig Note   ACCU-CHEK GUIDE test strip     amLODipine (NORVASC) 5 MG tablet Take 1 tablet (5 mg total) by mouth daily.    atorvastatin (LIPITOR) 20 MG tablet Take 1 tablet (20 mg total) by mouth daily.    Cholecalciferol (CVS D3) 50 MCG (2000 UT) CAPS TAKE 1 CAPSULE BY MOUTH EVERY DAY    ciprofloxacin (CIPRO) 500 MG tablet Take 1 tablet (500 mg total) by mouth 2 (two) times daily for 5 days.    folic acid (FOLVITE) 1 MG tablet Take by mouth.    metFORMIN (GLUCOPHAGE-XR) 500 MG 24 hr tablet Take 4 tablets in AM once a day for diabetes 05/07/2020: Still taking 1000mg  twice daily (states she didn't get the 500mg  ER rx filled)   methotrexate (RHEUMATREX) 2.5 MG tablet Take by mouth.    olmesartan-hydrochlorothiazide (BENICAR HCT) 20-12.5 MG tablet TAKE 1 TABLET BY MOUTH EVERY DAY    predniSONE (DELTASONE) 5 MG tablet Take 5 mg by mouth 2 (two) times daily. 04/14/2020: Takes 10mg  qam   [DISCONTINUED] FARXIGA 10 MG TABS tablet Take 10 mg by mouth daily. (Patient not taking: Reported on 11/24/2020)    No facility-administered encounter medications on file as of  04/23/2021.   NOT taking cipro prior to today's visit  Allergies  Allergen Reactions   Ampicillin Shortness Of Breath   Penicillins Shortness Of Breath and Swelling       Cephalosporins Itching   Elemental Sulfur Other (See Comments)    Feeling of burning on the inside  Persist ant cough   Nitrofuran Derivatives Itching    Throat, mouth, hands itch   Sulfamethoxazole-Trimethoprim Other (See Comments)    "body on fire"   Mobic [Meloxicam] Swelling and Rash    Pt states lips also swelled.   ROS: no fever, chills, URI symptoms, n/v/d, vaginal discharge, bleeding. No chest pain, shortness of breath, rash or other concerns, just LBP and urinary complaints per HPI.   PHYSICAL EXAM:  BP 110/62   Pulse 76   Temp 99 F (37.2 C) (Tympanic)   Ht 5' 3.5" (1.613 m)   Wt 169 lb (76.7 kg)   LMP 10/09/2015   BMI 29.47 kg/m   Well appearing ,pleasant female in no distress HEENT: conjunctiva and sclera are clear, wearing mask Neck: no lymphadenopathy Back: Tender at L flank, nontender over muscles or spine Heart: regular rate and rhythm Lungs: clear bilatearlly Abdomen: Soft.  Mildly tender along the left side of the abdomen. Nontender in suprapubic region. +mild epigastric tenderness. No rebound or guarding Extremities: no edema Psych: normal mood, affect, hygiene and grooming Neuro:  alert and oriented, normal strength, gait  Urine dip: 3+ blood, trace leuk   ASSESSMENT/PLAN:  Pyelonephritis of left kidney - suspected based on LG fever, L flank and abdominal pain. Treat with cipro, risks reviewed. f/u if persistent/worsening sx - Plan: ciprofloxacin (CIPRO) 500 MG tablet, Urine Culture  Low back pain, unspecified back pain laterality, unspecified chronicity, unspecified whether sciatica present - Plan: POCT Urinalysis DIP (Proadvantage Device)  Poss pyelonephritis in a patient with many drug allergies Treat with cipro, risks reviewed.  Father recently passed away. Grief  counseling was encouraged   Drink plenty of water. Take the antibiotic twice daily for 5 days. Contact us if you're getting worse--higher fever, vomiting, worsening pain. We are sending your urine for culture and will be in touch with the results--if no infection is found, we will tell you to stop the antibiotics. If the antibiotic is not working (resistance is seen on the culture), we will call you to change it.  If you have worsening pain in the back, if there is no improvement in your symptoms, we may need to further evaluate (by rechecking the urine, and possibly imaging the kidney).

## 2021-04-23 NOTE — Telephone Encounter (Signed)
Sympathy card sent for loss of father

## 2021-04-23 NOTE — Patient Instructions (Signed)
  Drink plenty of water. Take the antibiotic twice daily for 5 days. Contact us if you're getting worse--higher fever, vomiting, worsening pain. We are sending your urine for culture and will be in touch with the results--if no infection is found, we will tell you to stop the antibiotics. If the antibiotic is not working (resistance is seen on the culture), we will call you to change it.  If you have worsening pain in the back, if there is no improvement in your symptoms, we may need to further evaluate (by rechecking the urine, and possibly imaging the kidney).

## 2021-04-26 LAB — URINE CULTURE: Organism ID, Bacteria: NO GROWTH

## 2021-04-29 NOTE — Progress Notes (Signed)
She never told me that she saw her GYN or that she took antibiotics for her symptoms. It is very important that she communicate this information to [place] at her visits. At this point, if her symptoms have completely resolved, she can hold off on starting the Cipro.  If she has any recurrent symptoms, she should NOT take any antibiotics prior to [place] collecting a urine culture.

## 2021-05-11 DIAGNOSIS — Z6831 Body mass index (BMI) 31.0-31.9, adult: Secondary | ICD-10-CM | POA: Diagnosis not present

## 2021-05-11 DIAGNOSIS — Z124 Encounter for screening for malignant neoplasm of cervix: Secondary | ICD-10-CM | POA: Diagnosis not present

## 2021-05-11 DIAGNOSIS — Z01419 Encounter for gynecological examination (general) (routine) without abnormal findings: Secondary | ICD-10-CM | POA: Diagnosis not present

## 2021-05-11 LAB — RESULTS CONSOLE HPV: CHL HPV: NEGATIVE

## 2021-05-11 LAB — HM PAP SMEAR: HM Pap smear: NEGATIVE

## 2021-05-13 ENCOUNTER — Telehealth: Payer: Self-pay

## 2021-05-13 ENCOUNTER — Telehealth: Payer: Self-pay | Admitting: *Deleted

## 2021-05-13 DIAGNOSIS — Z800 Family history of malignant neoplasm of digestive organs: Secondary | ICD-10-CM | POA: Diagnosis not present

## 2021-05-13 DIAGNOSIS — Z809 Family history of malignant neoplasm, unspecified: Secondary | ICD-10-CM | POA: Diagnosis not present

## 2021-05-13 DIAGNOSIS — Z803 Family history of malignant neoplasm of breast: Secondary | ICD-10-CM | POA: Diagnosis not present

## 2021-05-13 DIAGNOSIS — Z808 Family history of malignant neoplasm of other organs or systems: Secondary | ICD-10-CM | POA: Diagnosis not present

## 2021-05-13 DIAGNOSIS — Z8042 Family history of malignant neoplasm of prostate: Secondary | ICD-10-CM | POA: Diagnosis not present

## 2021-05-13 DIAGNOSIS — Z801 Family history of malignant neoplasm of trachea, bronchus and lung: Secondary | ICD-10-CM | POA: Diagnosis not present

## 2021-05-13 DIAGNOSIS — Z1371 Encounter for nonprocreative screening for genetic disease carrier status: Secondary | ICD-10-CM | POA: Diagnosis not present

## 2021-05-13 NOTE — Progress Notes (Signed)
Chief Complaint  Patient presents with   Back Pain    Right sided low back pain in her kidney area, this morning the left side started hurting as well. She does mention some frequency but no other urinary symptoms.    Patient presents with complaint of recurrent flank pain.  She reports she has had ongoing R flank pain since last visit. She now also notes some pain on the left, started this morning.  "Something is going on with my breathing" It is harder for her to breathe, feels heavy, with any activity, even just moving around the exam room. She notes this for about a week.  She is accompanied by a young child today, frequently crying (her friend's). She denies doing any bending, lifting, twisting, she doesn't usually carry/care for this child. She was noted to have low grade fever in the office.  She is not having any known fever, chills, though she mentioned that last week when her aunt was over, she reported she felt warm.  She denies any URI symptoms, just the slight change in her breathing. She has occasional cough--causes more pain in her back when she coughs. She has some urinary frequency, no dysuria, no odor or cloudiness to the urine.  She was last seen 10/20 with complaints of urinary frequency and urgency, no dysuria, along with L flank pain and low grade fever. At that time, urine dip had 3+ blood, trace leuk.  She was prescribed cipro to treat a possible pyelonephritis. She hadn't told me that she was just recently treated with ABX for UTI from her GYN. Once we called her with negative culture results, we learned of this, and that the culture wasn't necessarily accurate.  She had never started the Cipro, and her complaints had reportedly resolved.  She last saw nephro in September. She is past due to f/u with endo on her DM, last seen in 07/2020 Fasting sugars are 150's, 200's after eating Lab Results  Component Value Date   HGBA1C 7.9 07/02/2020   PMH, PSH, SH  reviewed  Outpatient Encounter Medications as of 05/14/2021  Medication Sig Note   ACCU-CHEK GUIDE test strip     amLODipine (NORVASC) 5 MG tablet Take 1 tablet (5 mg total) by mouth daily.    atorvastatin (LIPITOR) 20 MG tablet Take 1 tablet (20 mg total) by mouth daily.    Cholecalciferol ([facility]S D3) 50 MCG (2000 UT) CAPS TAKE 1 CAPSULE BY MOUTH EVERY DAY    folic acid (FOLVITE) 1 MG tablet Take by mouth.    metFORMIN (GLUCOPHAGE-XR) 500 MG 24 hr tablet Take 4 tablets in AM once a day for diabetes 05/07/2020: Still taking 1000mg  twice daily (states she didn't get the 500mg  ER rx filled)   methotrexate (RHEUMATREX) 2.5 MG tablet Take by mouth.    olmesartan-hydrochlorothiazide (BENICAR HCT) 20-12.5 MG tablet TAKE 1 TABLET BY MOUTH EVERY DAY    potassium bicarbonate (K-LYTE) 25 MEQ disintegrating tablet Klor-Con/EF 25 mEq effervescent tablet  TAKE 1 TABLET BY MOUTH EVERY DAY    predniSONE (DELTASONE) 5 MG tablet Take 5 mg by mouth 2 (two) times daily. 04/14/2020: Takes 10mg  qam   No facility-administered encounter medications on file as of 05/14/2021.   Allergies reviewed.   ROS:  LG fever, no chills. Back pain and change in breathing, slight cough, per HPI.  No nausea, vomiting, heartburn. She does have a lot of burping, some constipation. Previously took pepcid, stopped by Dr Collene Mares (no longer needed). Denies dysuria, visible  hematuria, vaginal discharge or bleeding. See HPI   PHYSICAL EXAM:  BP 110/70   Pulse 80   Temp 99.6 F (37.6 C) (Tympanic)   Ht 5' 3.5" (1.613 m)   Wt 175 lb (79.4 kg)   LMP 10/09/2015   HC 1" (2.5 cm)   BMI 30.51 kg/m   Wt Readings from Last 3 Encounters:  05/14/21 175 lb (79.4 kg)  04/23/21 169 lb (76.7 kg)  11/24/20 170 lb 12.8 oz (77.5 kg)   Well appearing female, with young child who is frequently coughing and crying during the visit. HEENT: conjunctiva and sclera are clear, EOMI, wearing mask Neck: no lymphadenopathy or mass Heart: regular  rate and rhythm Lungs: clear bilaterally. No wheezes, rales, ronchi Back: Tender at lower thoracic spine (below bra strap) Tender at L rhomboid muscle, and lower lateral ribs. Tender at bilateral paraspinous muscles in lumbar area Tender at lateral lower ribs on R, nontender at rhomboid on R. +CVA tenderness, but is tender diffusely, and very superficially. Abd: tender in epigastrium, and fairly diffusely at RUQ and LUQ. No organomegaly or mass. No rebound tenderness or guarding Extremities: no edema Neuro: alert and oriented, normal strength, gait Psych: normal mood, affect, hygiene and grooming  Urine dip: SG 1.020, mod blood (unchanged from prior), no proten, leuks, nitrite.  Lab Results  Component Value Date   HGBA1C 7.6 (A) 05/14/2021    ASSESSMENT/PLAN:  Low back pain, unspecified back pain laterality, unspecified chronicity, unspecified whether sciatica present - Exam c/w MSK discomfort.  Heat, massage, stretches. Avoid NSAID due to prednisone. Do not suspect infection or kidney issue - Plan: POCT Urinalysis DIP (Proadvantage Device), DG Chest 2 View  Shortness of breath - lungs clear. Check CXR.  Given epigastric tenderness and chronic NSAIDs, rec pepcid x 2 weeks or longer. GERD can affect breathing - Plan: DG Chest 2 View  Type 2 diabetes mellitus with stage 2 chronic kidney disease, without long-term current use of insulin (HCC) - not at goal, under care of endo but past due.  Reminded to schedule, and reviewed sugar goals - Plan: HgB A1c  Fever, unspecified fever cause - unclear etiology, had at last visit also. Given slight cough and change in breathing, check CXR to r/o pna - Plan: DG Chest 2 View  Relapsing polychondritis - pt has this diagnosis, and question if some of her pain is related. She is on chronic prednisone  Microscopic hematuria - This is chronic. Recently had nephro visit. I do not suspect UTI, no longer has leuks  CKD (chronic kidney disease) stage 2,  GFR 60-89 ml/min - recent nephro visit, stable. Avoid NSAIDS (also due to her being on chronic prednisone)  Epigastric pain - in pt on chronic prednisone. Rec restarting famotidine   At the very end of the visit, as the door was open for me to leave, she mentioned having a problem with her right hand.  She is aware that her visit was over, and she stated she would schedule another appointment to discuss this.  Past due for endo, last seen in 07/2020. Reminded to schedule.

## 2021-05-13 NOTE — Telephone Encounter (Signed)
At the time of her last visit, I hadn't been aware that she was receiving treatment for UTI from her GYN.  Her culture at that time was negative (but possibly influenced by those antibiotics).  When you spoke to her, I thought she was feeling better, so nothing further was done.  If she is having recurrent flank pain or urinary symptoms, she needs to have this evaluated, with a culture (without having taken antibiotics). I know we don't have openings today--see what her symptoms are currently (urinary symptoms, just flank pain, fever, etc) to see if I need to work in, vs just have her come for urine.  Imaging is not the appropriate step at this time, given the f/u info I received after her last visit.

## 2021-05-13 NOTE — Telephone Encounter (Signed)
A message was given to me that patient called yesterday that patient was like to do the imaging of her kidneys. Would you like her to come in tomorrow for re-eval?

## 2021-05-13 NOTE — Telephone Encounter (Signed)
Error

## 2021-05-13 NOTE — Telephone Encounter (Signed)
Patient called requesting to speak with Michelle Mack about CT scan,   Also scheduled appointment with [facility] tomorrow for back pain

## 2021-05-14 ENCOUNTER — Encounter: Payer: Self-pay | Admitting: Family Medicine

## 2021-05-14 ENCOUNTER — Ambulatory Visit
Admission: RE | Admit: 2021-05-14 | Discharge: 2021-05-14 | Disposition: A | Discharge status: Home / Self Care | Payer: BC Managed Care – PPO | Source: Ambulatory Visit | Attending: Family Medicine | Admitting: Family Medicine

## 2021-05-14 ENCOUNTER — Ambulatory Visit: Payer: BC Managed Care – PPO | Admitting: Family Medicine

## 2021-05-14 ENCOUNTER — Other Ambulatory Visit: Payer: Self-pay

## 2021-05-14 VITALS — BP 110/70 | HR 80 | Temp 99.6°F | Ht 63.5 in | Wt 175.0 lb

## 2021-05-14 DIAGNOSIS — M545 Low back pain, unspecified: Secondary | ICD-10-CM

## 2021-05-14 DIAGNOSIS — E1122 Type 2 diabetes mellitus with diabetic chronic kidney disease: Secondary | ICD-10-CM

## 2021-05-14 DIAGNOSIS — R1013 Epigastric pain: Secondary | ICD-10-CM

## 2021-05-14 DIAGNOSIS — R3129 Other microscopic hematuria: Secondary | ICD-10-CM

## 2021-05-14 DIAGNOSIS — R509 Fever, unspecified: Secondary | ICD-10-CM

## 2021-05-14 DIAGNOSIS — R0602 Shortness of breath: Secondary | ICD-10-CM

## 2021-05-14 DIAGNOSIS — N182 Chronic kidney disease, stage 2 (mild): Secondary | ICD-10-CM

## 2021-05-14 DIAGNOSIS — M941 Relapsing polychondritis: Secondary | ICD-10-CM

## 2021-05-14 LAB — POCT URINALYSIS DIP (PROADVANTAGE DEVICE)
Bilirubin, UA: NEGATIVE
Glucose, UA: NEGATIVE mg/dL
Ketones, POC UA: NEGATIVE mg/dL
Leukocytes, UA: NEGATIVE
Nitrite, UA: NEGATIVE
Protein Ur, POC: NEGATIVE mg/dL
Specific Gravity, Urine: 1.02
Urobilinogen, Ur: NEGATIVE
pH, UA: 6 (ref 5.0–8.0)

## 2021-05-14 LAB — POCT GLYCOSYLATED HEMOGLOBIN (HGB A1C): Hemoglobin A1C: 7.6 % — AB (ref 4.0–5.6)

## 2021-05-14 NOTE — Patient Instructions (Addendum)
Please go to [facility] at your convenience to get the chest x-ray.  [address] or [place] for [facility].  Much of your discomfort seems very superficial, overlying muscles and ribs.   Try heating pad, warm compresses and/or massage. You can also try topical medications such as Biofreeze or Salonpas  Please schedule a visit with your endocrinologist.  You are past due and your sugars are above goal.  A1c was 7.6% (goal is under 7%). Please look at your instructions for your metformin.  I believe you are supposed to be taking a total daily dose of 2000mg --unsure if you have the 500mg  extended release that you are supposed to take 4 all at once, or if you have 1000mg  tablets that you are supposed to take 1 twice daily.  Look at your bottles and follow the directions.  Your urine is normal--no sign of infection.  I recommend restarting the famotidine (pepcid) once daily, since you were tender in your stomach area (upper abdomen).  This may help with the burping, and the discomfort.  Being on chronic prednisone can increase your risk for stomach inflammation.

## 2021-05-25 ENCOUNTER — Encounter: Payer: Self-pay | Admitting: Family Medicine

## 2021-05-25 ENCOUNTER — Telehealth: Payer: BC Managed Care – PPO | Admitting: Family Medicine

## 2021-05-25 ENCOUNTER — Other Ambulatory Visit: Payer: Self-pay

## 2021-05-25 VITALS — Temp 98.0°F | Ht 63.5 in | Wt 175.0 lb

## 2021-05-25 DIAGNOSIS — I10 Essential (primary) hypertension: Secondary | ICD-10-CM | POA: Diagnosis not present

## 2021-05-25 DIAGNOSIS — Z20828 Contact with and (suspected) exposure to other viral communicable diseases: Secondary | ICD-10-CM

## 2021-05-25 DIAGNOSIS — R0789 Other chest pain: Secondary | ICD-10-CM

## 2021-05-25 DIAGNOSIS — J069 Acute upper respiratory infection, unspecified: Secondary | ICD-10-CM

## 2021-05-25 MED ORDER — ALBUTEROL SULFATE HFA 108 (90 BASE) MCG/ACT IN AERS
2.0000 | INHALATION_SPRAY | Freq: Four times a day (QID) | RESPIRATORY_TRACT | 0 refills | Status: DC | PRN
Start: 2021-05-25 — End: 2021-10-26

## 2021-05-25 MED ORDER — AEROCHAMBER MV MISC: 0 refills | Status: DC

## 2021-05-25 MED ORDER — ALBUTEROL SULFATE (2.5 MG/3ML) 0.083% IN NEBU: 2.5000 mg | INHALATION_SOLUTION | Freq: Four times a day (QID) | RESPIRATORY_TRACT | 1 refills | Status: DC | PRN

## 2021-05-25 NOTE — Progress Notes (Signed)
Start time: 11:59 End time: 12:25  Virtual Visit via Video Note  I connected with Michelle Mack on 05/25/21 by a video enabled telemedicine application and verified that I am speaking with the correct person using two identifiers.  Location: Patient: home Provider: office   I discussed the limitations of evaluation and management by telemedicine and the availability of in person appointments. The patient expressed understanding and agreed to proceed.  History of Present Illness:  Chief Complaint  Patient presents with   Cough    VIRTUAL coughing and sinus congestion. ST. Wheezing. Discolored (green) mucus. Symptoms started last Wed, child at home positive for RSV. Another child at home has flu. Did home covid test that was negative on Wednesday.    Patient has been around 2 children that have tested positive for RSV.  She was also exposed to a child with influenza, while they were sick.  11/16 she had slight cough. 11/17 she started with runny nose, sore throat. No fever or chills. No body aches.  LBP is about the same as it was at her visit 11/10 (not worse). Nasal congestion/pain only, no sinus pain.  Nasal drainage is yellow-green. +PND, same color phlegm. +coughing.  Home COVID test was negative on Wednesay 11/16  She has been taking Dayquil and nyquil, and doing salt water gargles. Pt has hypertension.  BP's have been 644 systolic when checked (not today). She is feeling wheezy, throat tightness, asking for refill of nebulizer (has machine, no mediation), and also or inhaler. She denies significant DOE, just a tightness (and more in throat than chest, has tracheomalacia).   PMH, PSH, SH reviewed  Outpatient Encounter Medications as of 05/25/2021  Medication Sig Note   albuterol (PROVENTIL) (2.5 MG/3ML) 0.083% nebulizer solution Take 3 mLs (2.5 mg total) by nebulization every 6 (six) hours as needed for wheezing or shortness of breath.    albuterol (VENTOLIN HFA)  108 (90 Base) MCG/ACT inhaler Inhale 2 puffs into the lungs every 6 (six) hours as needed for wheezing or shortness of breath.    Pseudoeph-Doxylamine-DM-APAP (NYQUIL PO) Take 30 mLs by mouth at bedtime as needed. 05/25/2021: Took last night    Pseudoephedrine-APAP-DM (DAYQUIL PO) Take 2 each by mouth as needed. 05/25/2021: Last dose yesterday afternoon   Spacer/Aero-Holding Chambers (AEROCHAMBER MV) inhaler Use as instructed    ACCU-CHEK GUIDE test strip  (Patient not taking: Reported on 05/25/2021)    amLODipine (NORVASC) 5 MG tablet Take 1 tablet (5 mg total) by mouth daily. (Patient not taking: Reported on 05/25/2021) 05/25/2021: Did not take today   atorvastatin (LIPITOR) 20 MG tablet Take 1 tablet (20 mg total) by mouth daily. (Patient not taking: Reported on 05/25/2021) 05/25/2021: Did not take today   Cholecalciferol (CVS D3) 50 MCG (2000 UT) CAPS TAKE 1 CAPSULE BY MOUTH EVERY DAY (Patient not taking: Reported on 05/25/2021) 05/25/2021: Did not take today   folic acid (FOLVITE) 1 MG tablet Take by mouth. (Patient not taking: Reported on 05/25/2021) 05/25/2021: Did not take today   metFORMIN (GLUCOPHAGE-XR) 500 MG 24 hr tablet Take 4 tablets in AM once a day for diabetes (Patient not taking: Reported on 05/25/2021) 05/25/2021: Did not take today   methotrexate (RHEUMATREX) 2.5 MG tablet Take by mouth. (Patient not taking: Reported on 05/25/2021) 05/25/2021: Did not take today   olmesartan-hydrochlorothiazide (BENICAR HCT) 20-12.5 MG tablet TAKE 1 TABLET BY MOUTH EVERY DAY (Patient not taking: Reported on 05/25/2021) 05/25/2021: Did not take today   potassium bicarbonate (K-LYTE)  25 MEQ disintegrating tablet Klor-Con/EF 25 mEq effervescent tablet  TAKE 1 TABLET BY MOUTH EVERY DAY (Patient not taking: Reported on 05/25/2021) 05/25/2021: Did not take today   predniSONE (DELTASONE) 5 MG tablet Take 5 mg by mouth 2 (two) times daily. (Patient not taking: Reported on 05/25/2021) 05/25/2021: Did not  take today   No facility-administered encounter medications on file as of 05/25/2021.  Allergies reviewed.  ROS: no fever or chills.  +URI symptoms per HPI.  No n/v/d.  No rash.  LBP unchanged.  See HPI     Observations/Objective:  Temp 98 F (36.7 C) (Temporal)   Ht 5' 3.5" (1.613 m)   Wt 175 lb (79.4 kg)   LMP 10/09/2015   BMI 30.51 kg/m   Well-appearing, pleasant female.  Rare cough during visit. She is alert and oriented, cranial nerves are grossly intact. She is speaking comfortably, in no distress. Exam is limited due to virtual nature of the visit.   Assessment and Plan:  Viral upper respiratory illness - suspect RSV. COVID test done too early, rec repeat and c/b if +. Supportive measures. avoid decongestants  Chest tightness - pt with tracheomalacia; previously benefited from nebs when felt similarly. Some sx related to PND in throat, rec mucinex. SE and albuterol technique reviewed - Plan: albuterol (VENTOLIN HFA) 108 (90 Base) MCG/ACT inhaler, albuterol (PROVENTIL) (2.5 MG/3ML) 0.083% nebulizer solution, Spacer/Aero-Holding Chambers (AEROCHAMBER MV) inhaler  Exposure to respiratory syncytial virus (RSV)  Essential hypertension, benign - She didn't check BP today, but has been very high.  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 F/u Monday if not better or worse.  For grocery store--can return, wearing mask   Follow Up Instructions:    I discussed the assessment and treatment plan with the patient. The patient was provided an opportunity to ask questions and all were answered. The patient agreed with the plan and demonstrated an understanding of the instructions.   The patient was advised to call back or seek an in-person evaluation if the symptoms worsen or if the condition fails to improve as anticipated.  I spent 27  minutes dedicated to the care of  this patient, including pre-visit review of records, face to face time, post-visit ordering of testing and documentation.    Vikki Ports, MD

## 2021-05-25 NOTE — Patient Instructions (Signed)
Stop using Dayquil and Nyquil because these are raising your blood pressure. Continue to monitor your blood pressure and ensure that it comes down. I recommend using Mucinex DM 12 hour twice daily (containing guaifenesin and dextromethorphan) Stay well hydrated. Coricidin HBP is okay to take with high blood pressure, but may overlap the ingredients of the guaifenesin or dextrometorpan--need to read the label and be sure not to overlap ingredients.  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  Please contact us if your symptoms change or worsen.

## 2021-05-26 ENCOUNTER — Telehealth: Payer: Self-pay | Admitting: Family Medicine

## 2021-05-26 NOTE — Telephone Encounter (Signed)
Lvm for pt to call back  If she had  questions from the message. [place]

## 2021-05-26 NOTE — Telephone Encounter (Signed)
She had pain in the nose yesterday, and had reported very high blood pressure.  I still think this is viral, too soon to say for sure this is bacterial. If having headaches, need to know what her BP is running. We had discussed stopping all OTC meds with decongestants, which could raise BP.  Remind her that info was put on her AVS with what we discussed.  We discussed use of Mucinex DM 12 hour twice daily, monitoring BP, stopping decongestants.  I would also like for her to start sinus rinses (either sinus rinse kit or Neti-pot) once or twice daily, which will help with her nose/sinus pain. We had discussed reaching back out later in the weekend or Monday if not improving.  She had exposures to multiple viruses; antibiotics do not treat viral infections, just create more potential risks/side effects and resistance.  If she is getting worse rather than improving over the next 3-5 days, I'll send in an antibiotic.

## 2021-05-26 NOTE — Telephone Encounter (Signed)
Pt called and states that she now has a headache and the facial pressure around the nose and the eyes and wants to know if you will send her something in, Pt uses [id number] - [place], [place] - [place]

## 2021-05-27 DIAGNOSIS — J45909 Unspecified asthma, uncomplicated: Secondary | ICD-10-CM | POA: Diagnosis not present

## 2021-05-27 DIAGNOSIS — J019 Acute sinusitis, unspecified: Secondary | ICD-10-CM | POA: Diagnosis not present

## 2021-06-02 ENCOUNTER — Ambulatory Visit: Payer: BC Managed Care – PPO | Admitting: Medical

## 2021-06-02 ENCOUNTER — Other Ambulatory Visit: Payer: Self-pay

## 2021-06-02 VITALS — BP 122/80 | HR 99 | Temp 98.4°F | Wt 177.4 lb

## 2021-06-02 DIAGNOSIS — M778 Other enthesopathies, not elsewhere classified: Secondary | ICD-10-CM

## 2021-06-02 NOTE — Progress Notes (Signed)
Subjective:  Michelle Mack is a 55 y.o. female who presents for Chief Complaint  Patient presents with   right thumb pain    Right thumb pain. Been going on for a couple months. Sometimes has trouble grabbing things      Here for right thumb pain.  She is right-handed.  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  She does use her hands quite regularly.  She also text on the phone regularly.  She has pain with right thumb motion, but no swelling, no numbness or tingling.  No finger or hand weakness.  No injury trauma or fall.  No other finger or hand or wrist concerns.  Otherwise normal state of health.  She is on prednisone daily per pulmonary specialist.  She is concerned about carpal tunnel syndrome.  No wrist pain.  No hand numbness tingling or weakness.  No other aggravating or relieving factors.    No other c/o.  The following portions of the patient's history were reviewed and updated as appropriate: allergies, current medications, past family history, past medical history, past social history, past surgical history and problem list.  Past Medical History:  Diagnosis Date   Anemia    on iron   Back pain    tx with ibuprofen 800mg    Bronchitis    treated recently by abx   Cough    non-productive   DUB (dysfunctional uterine bleeding)    Fibroid uterus    GYN--Dr. Rivard   GERD (gastroesophageal reflux disease)    Hypertension    Infertility, female    OA (osteoarthritis) of knee 04/02/2020   Mod osteoarthritis, medial compartment of L knee, per x-rays at [facility].  Cortisone injection by Dr. Layne Benton 03/05/20   Polychondritis    Recurrent upper respiratory infection (URI)    in October - tx with abx   Type II or unspecified type diabetes mellitus without mention of complication, not stated as uncontrolled    diagnosed by Dr. Berdine Addison    Current Outpatient Medications on File Prior to Visit  Medication Sig Dispense Refill   ACCU-CHEK GUIDE test strip       albuterol (PROVENTIL) (2.5 MG/3ML) 0.083% nebulizer solution Take 3 mLs (2.5 mg total) by nebulization every 6 (six) hours as needed for wheezing or shortness of breath. 150 mL 1   albuterol (VENTOLIN HFA) 108 (90 Base) MCG/ACT inhaler Inhale 2 puffs into the lungs every 6 (six) hours as needed for wheezing or shortness of breath. 8 g 0   amLODipine (NORVASC) 5 MG tablet Take 1 tablet (5 mg total) by mouth daily. 90 tablet 1   atorvastatin (LIPITOR) 20 MG tablet Take 1 tablet (20 mg total) by mouth daily. 90 tablet 1   Cholecalciferol ([facility]S D3) 50 MCG (2000 UT) CAPS TAKE 1 CAPSULE BY MOUTH EVERY DAY 947 capsule 3   folic acid (FOLVITE) 1 MG tablet Take by mouth.     metFORMIN (GLUCOPHAGE-XR) 500 MG 24 hr tablet      olmesartan-hydrochlorothiazide (BENICAR HCT) 20-12.5 MG tablet TAKE 1 TABLET BY MOUTH EVERY DAY 90 tablet 0   potassium bicarbonate (K-LYTE) 25 MEQ disintegrating tablet      predniSONE (DELTASONE) 5 MG tablet Take 5 mg by mouth 2 (two) times daily.     Pseudoeph-Doxylamine-DM-APAP (NYQUIL PO) Take 30 mLs by mouth at bedtime as needed.     Pseudoephedrine-APAP-DM (DAYQUIL PO) Take 2 each by mouth as needed.     Spacer/Aero-Holding Chambers (AEROCHAMBER  MV) inhaler Use as instructed 1 each 0   methotrexate (RHEUMATREX) 2.5 MG tablet Take by mouth. (Patient not taking: Reported on 06/02/2021)     No current facility-administered medications on file prior to visit.       ROS Otherwise as in subjective above  Objective: BP 122/80   Pulse 99   Temp 98.4 F (36.9 C)   Wt 177 lb 6.4 oz (80.5 kg)   LMP 10/09/2015   SpO2 97%   BMI 30.93 kg/m   General appearance: alert, no distress, well developed, well nourished She is tender over the right thumb at the MCP and PIP, she is also tender over the anterior and posterior lateral forearm, quite tender with resisted extension of the thumb both in the thumb and the forearm, less tender with flexion.  No obvious swelling.  Otherwise  fingers and hand nontender, thumb and wrist and hand range of motion normal. Arms and hands and fingers neurovascularly intact    Assessment: Encounter Diagnosis  Name Primary?   Thumb tendonitis Yes     Plan: Symptoms and exam suggest thumb tendinitis.  I advised cool therapy or ice therapy 20 minutes at a time peripherally more than once a day for the next week, she is already on prednisone for inflammation through pulmonology, can use Tylenol for worse pain.  Advise she begin thumb spica splint.  We discussed the need for rest of the thumb.  Since she sees [facility] orthopedics already I will send a copy of my note orthopedics and asked them to get her in for a visit soon for the same as they might can do some other therapy to help  Michelle Mack was seen today for right thumb pain.  Diagnoses and all orders for this visit:  Thumb tendonitis    Follow up: prn

## 2021-06-11 ENCOUNTER — Other Ambulatory Visit: Payer: Self-pay | Admitting: Family Medicine

## 2021-06-11 ENCOUNTER — Other Ambulatory Visit: Payer: Self-pay

## 2021-06-11 ENCOUNTER — Ambulatory Visit: Payer: BC Managed Care – PPO | Admitting: Family Medicine

## 2021-06-11 ENCOUNTER — Encounter: Payer: Self-pay | Admitting: Family Medicine

## 2021-06-11 VITALS — BP 110/68 | HR 80 | Temp 98.7°F | Ht 63.5 in | Wt 169.0 lb

## 2021-06-11 DIAGNOSIS — E1165 Type 2 diabetes mellitus with hyperglycemia: Secondary | ICD-10-CM

## 2021-06-11 DIAGNOSIS — R0789 Other chest pain: Secondary | ICD-10-CM

## 2021-06-11 DIAGNOSIS — R309 Painful micturition, unspecified: Secondary | ICD-10-CM

## 2021-06-11 LAB — POCT URINALYSIS DIP (PROADVANTAGE DEVICE)
Bilirubin, UA: NEGATIVE
Glucose, UA: NEGATIVE mg/dL
Ketones, POC UA: NEGATIVE mg/dL
Leukocytes, UA: NEGATIVE
Nitrite, UA: NEGATIVE
Protein Ur, POC: NEGATIVE mg/dL
Specific Gravity, Urine: 1.025
Urobilinogen, Ur: NEGATIVE
pH, UA: 6 (ref 5.0–8.0)

## 2021-06-11 NOTE — Patient Instructions (Signed)
  Your urine was very concentrated.  This can look darker and have more of an odor.  Goal is to have very pale/clear urine. You need to drink more water (not juice--if you truly want the cranberry, you can take an extract tablet, rather than the juice) There was blood (which is chronic), but nothing to suggest an infection currently.  Your blood sugars are running in the 200's, which is too high. You currently are only taking 1000mg  of metformin daily, and no other medications. I encourage you to increase your metformin to 3 pills daily (dose of 1500mg  daily), and to schedule a follow-up with the [facility] endocrinologists (Dr. Altheimer's replacement).  Fasting sugars should be running <130 Please cut back on sweet tea, pasta, juices, bread (buns, pizza) in order to keep your sugars down and not need even more medication to get your sugars controlled.

## 2021-06-11 NOTE — Progress Notes (Signed)
Chief Complaint  Patient presents with   Urinary Frequency    Having urinary frequency and when she coughs she has some incontinence. Has some burning towards the end of her stream. Symptoms have been for about 3 weeks. Started drinking cranberry juice last week and has helped only a little bit.    She notices an odor to her urine.  Has some discomfort at the very end of voiding, in vaginal area (external).  She denies any abdominal pain.  She started drinking cranberry juice, which has helped some. Pain is less, still has odor to the urine.  Denies any increase in sugars, states they are running the same. 219 this morning, fasting. Reports this is typical for her.  Diet includes pizza, pasta, sweet tea. She is taking 1000mg  of metformin daily. DM has been managed by endo; above goal on last check. She is past due to see endo.  Dr. Elyse Hsu retired, hasn't seen his replacement. Lab Results  Component Value Date   HGBA1C 7.6 (A) 05/14/2021    She had UTI treated with nitrofurantoin by GYN in October. She was seen here shortly after with complaints of UTI (and we weren't made aware of recent GYN visit or ABX)--Urine culture 04/23/2021 negative (but had been on ABX from GYN) No other UTI's  Recently seen with URI symptoms. She reports she is still coughing; nebulizer helps temporarily.   Still has some cough--mucus is now clear.  She did not offer the information that she was subsequently seen at [facility] (given injection and breathing treatment per phone call to pulm), but seen on review of chart after patient left the office.   PMH, PSH, SH reviewed  Outpatient Encounter Medications as of 06/11/2021  Medication Sig Note   ACCU-CHEK GUIDE test strip     albuterol (PROVENTIL) (2.5 MG/3ML) 0.083% nebulizer solution Take 3 mLs (2.5 mg total) by nebulization every 6 (six) hours as needed for wheezing or shortness of breath. 06/11/2021: Used last night   albuterol (VENTOLIN HFA) 108 (90 Base)  MCG/ACT inhaler Inhale 2 puffs into the lungs every 6 (six) hours as needed for wheezing or shortness of breath. 06/11/2021: Used yesterday   amLODipine (NORVASC) 5 MG tablet Take 1 tablet (5 mg total) by mouth daily.    atorvastatin (LIPITOR) 20 MG tablet Take 1 tablet (20 mg total) by mouth daily.    Cholecalciferol (CVS D3) 50 MCG (2000 UT) CAPS TAKE 1 CAPSULE BY MOUTH EVERY DAY    Dextromethorphan-guaiFENesin (MUCINEX FAST-MAX DM MAX) 5-100 MG/5ML LIQD Take 20 mLs by mouth as needed. 06/11/2021: Last took yesterday   folic acid (FOLVITE) 1 MG tablet Take by mouth.    metFORMIN (GLUCOPHAGE-XR) 500 MG 24 hr tablet 1,000 mg daily with breakfast.    olmesartan-hydrochlorothiazide (BENICAR HCT) 20-12.5 MG tablet TAKE 1 TABLET BY MOUTH EVERY DAY    predniSONE (DELTASONE) 5 MG tablet Take 10 mg by mouth daily with breakfast.    Spacer/Aero-Holding Chambers (AEROCHAMBER MV) inhaler Use as instructed    methotrexate (RHEUMATREX) 2.5 MG tablet Take by mouth. (Patient not taking: Reported on 06/02/2021)    potassium bicarbonate (K-LYTE) 25 MEQ disintegrating tablet  (Patient not taking: Reported on 06/11/2021)    [DISCONTINUED] Pseudoeph-Doxylamine-DM-APAP (NYQUIL PO) Take 30 mLs by mouth at bedtime as needed. 05/25/2021: Took last night    [DISCONTINUED] Pseudoephedrine-APAP-DM (DAYQUIL PO) Take 2 each by mouth as needed. 05/25/2021: Last dose yesterday afternoon   No facility-administered encounter medications on file as of 06/11/2021.  Allergies reviewed in detail with pt.  ROS: No f/c. No n/v/d. Urinary symptoms per HPI. No vaginal discharge. Cough improving per HPI.  No rashes or other concerns.  PHYSICAL EXAM:  BP 110/68   Pulse 80   Temp 98.7 F (37.1 C) (Tympanic)   Ht 5' 3.5" (1.613 m)   Wt 169 lb (76.7 kg)   LMP 10/09/2015   BMI 29.47 kg/m   Well-appearing female, in good spirits. No coughing during visit. Speaking comfortably, in no distress HEENT: conjunctiva and sclera are  clear, EOMI, wearing mask Heart: regular rate and rhythm Lungs: clear, no wheezing Back: no spinal or CVA tenderness Abdomen: soft, nontender GU: normal external genitalia, no lesions, no erythema. She is sensitive in the peri-urethral area, but no redness or abnl noted.  No abnormal vaginal discharge.  Mucosa appears normal, no significant atrophic changes noted. Extremities: no edema  Urine: SG 1.025, large blood, neg nitrite, leuks   ASSESSMENT/PLAN:  Pain with urination - Plan: POCT Urinalysis DIP (Proadvantage Device)  Type 2 diabetes mellitus with hyperglycemia, without long-term current use of insulin (HCC) - DM above goal, poor diet. Increase metformin to 1500mg  daily. Advised to schedule f/u endo appt  Pt had questions regarding her allergies--all reviewed with pt.  Reassured no e/o UTI today.  Ddx of what could be causing symptoms. Suspect that her increased fluid intake (rather than the cranberry juice) is what helped her symptoms.    Your urine was very concentrated.  This can look darker and have more of an odor.  Goal is to have very pale/clear urine. You need to drink more water (not juice--if you truly want the cranberry, you can take an extract tablet, rather than the juice) There was blood (which is chronic), but nothing to suggest an infection currently.  Your blood sugars are running in the 200's, which is too high. You currently are only taking 1000mg  of metformin daily, and no other medications. I encourage you to increase your metformin to 3 pills daily (dose of 1500mg  daily), and to schedule a follow-up with the [facility] endocrinologists (Dr. Altheimer's replacement).  Fasting sugars should be running <130 Please cut back on sweet tea, pasta, juices, bread (buns, pizza) in order to keep your sugars down and not need even more medication to get your sugars controlled.

## 2021-06-16 ENCOUNTER — Telehealth: Payer: Self-pay

## 2021-06-16 ENCOUNTER — Other Ambulatory Visit: Payer: Self-pay | Admitting: Family Medicine

## 2021-06-16 ENCOUNTER — Other Ambulatory Visit: Payer: Self-pay | Admitting: Internal Medicine

## 2021-06-16 MED ORDER — POTASSIUM CHLORIDE CRYS ER 20 MEQ PO TBCR: 20.0000 meq | EXTENDED_RELEASE_TABLET | Freq: Every day | ORAL | 1 refills | Status: DC

## 2021-06-16 NOTE — Telephone Encounter (Signed)
Pt wants tablets. I have discontinued the disintegrating tablet from her med list and refilled the 20mg  tablet per pt request for 6 months

## 2021-06-16 NOTE — Telephone Encounter (Signed)
Pt. Called stating that she needs a refill on her [place] 20 meq last apt was 06/11/21.

## 2021-06-16 NOTE — Telephone Encounter (Signed)
We never spoke about her K+--it was likely [place] when she went through her med list.  And we cannot tell if there is a refill on a rx sent by another provider outside of epic. The real question is does she want to stay on the disintegrating tablet, or go back to what I HAD been prescribing for her, which was the 20 mEq tablet.  If she is fine with going back to the tablet, okay to refill x 6 mos.  It is always easiest if they request refills through the pharmacy, that way the prescription request gets sent to the prescribing physician.  As I said in earlier message, I don't know who or why it was changed.

## 2021-06-16 NOTE — Telephone Encounter (Signed)
Chart reviewed 20 mEq dose was last rx'd in 03/2020, stopped in 11/2021. Current Rx is for the 25 mEq disintegrating tablet, which wasn't prescribed by [place]. (Not sure why it was changed, wasn't a discussion we had)  I'm not sure who prescribed it, and I'm not sure if she is intentionally requesting to be switched back to the other medication. Please check with pt, and perhaps she should request the refill from the pharmacy so that the prescribing doc gets the request.

## 2021-06-17 ENCOUNTER — Other Ambulatory Visit: Payer: Self-pay

## 2021-06-17 ENCOUNTER — Ambulatory Visit: Payer: BC Managed Care – PPO | Admitting: Plastic Surgery

## 2021-06-17 ENCOUNTER — Institutional Professional Consult (permissible substitution): Payer: BC Managed Care – PPO | Admitting: Plastic Surgery

## 2021-06-17 VITALS — BP 116/76 | HR 84 | Ht 64.0 in | Wt 170.0 lb

## 2021-06-17 DIAGNOSIS — N62 Hypertrophy of breast: Secondary | ICD-10-CM | POA: Diagnosis not present

## 2021-06-17 DIAGNOSIS — Z411 Encounter for cosmetic surgery: Secondary | ICD-10-CM | POA: Diagnosis not present

## 2021-06-17 NOTE — Progress Notes (Signed)
Referring Provider [name], MD [address] [place],  [place] [address]   CC: No chief complaint on file.     Michelle Mack is an 55 y.o. female.  HPI: Patient presents to discuss breast reduction.  She said years of back pain, neck pain and shoulder grooving related to her large breasts.  She is tried over-the-counter medications, warm packs, cold packs and supportive bras with little relief.  She also gets rashes beneath her breast that been refractory to over-the-counter treatments.  She has had no previous breast biopsies or procedures.  She has been to physical therapy for back pain with little relief.  She does not smoke but is diabetic.  Recent hemoglobin A1c 7.6.  She is currently a double D cup and wants to be a C cup.  She is overdue for mammogram.  She is also bothered by her abdominal contour.  She has not had any previous abdominal procedures.  She is interested in a flatter more contoured abdomen.    Outpatient Encounter Medications as of 06/17/2021  Medication Sig Note   ACCU-CHEK GUIDE test strip     albuterol (PROVENTIL) (2.5 MG/3ML) 0.083% nebulizer solution Take 3 mLs (2.5 mg total) by nebulization every 6 (six) hours as needed for wheezing or shortness of breath. 06/11/2021: Used last night   albuterol (VENTOLIN HFA) 108 (90 Base) MCG/ACT inhaler Inhale 2 puffs into the lungs every 6 (six) hours as needed for wheezing or shortness of breath. 06/11/2021: Used yesterday   amLODipine (NORVASC) 5 MG tablet Take 1 tablet (5 mg total) by mouth daily.    atorvastatin (LIPITOR) 20 MG tablet Take 1 tablet (20 mg total) by mouth daily.    Cholecalciferol (CVS D3) 50 MCG (2000 UT) CAPS TAKE 1 CAPSULE BY MOUTH EVERY DAY    Dextromethorphan-guaiFENesin (MUCINEX FAST-MAX DM MAX) 5-100 MG/5ML LIQD Take 20 mLs by mouth as needed. 06/11/2021: Last took yesterday   folic acid (FOLVITE) 1 MG tablet Take by mouth.    metFORMIN (GLUCOPHAGE-XR) 500 MG 24 hr tablet 1,000 mg  daily with breakfast.    methotrexate (RHEUMATREX) 2.5 MG tablet Take by mouth.    olmesartan-hydrochlorothiazide (BENICAR HCT) 20-12.5 MG tablet TAKE 1 TABLET BY MOUTH EVERY DAY    potassium chloride SA (KLOR-CON M20) 20 MEQ tablet Take 1 tablet (20 mEq total) by mouth daily.    predniSONE (DELTASONE) 5 MG tablet Take 10 mg by mouth daily with breakfast.    Spacer/Aero-Holding Chambers (AEROCHAMBER MV) inhaler Use as instructed    No facility-administered encounter medications on file as of 06/17/2021.     Past Medical History:  Diagnosis Date   Anemia    on iron   Back pain    tx with ibuprofen 800mg    Bronchitis    treated recently by abx   Cough    non-productive   DUB (dysfunctional uterine bleeding)    Fibroid uterus    GYN--Dr. Rivard   GERD (gastroesophageal reflux disease)    Hypertension    Infertility, female    OA (osteoarthritis) of knee 04/02/2020   Mod osteoarthritis, medial compartment of L knee, per x-rays at [facility].  Cortisone injection by Dr. Layne Benton 03/05/20   Polychondritis    Recurrent upper respiratory infection (URI)    in October - tx with abx   Type II or unspecified type diabetes mellitus without mention of complication, not stated as uncontrolled    diagnosed by Dr. Berdine Addison    Past Surgical History:  Procedure Laterality Date   BRONCHOSCOPY  01/2014   at [facility] expiratory collapse of trachea and focal narrowing in L mainstem bronchus and LUL   ENDOMETRIAL VAPORIZATION W/ VERSAPOINT     LEFT HEART CATH AND CORONARY ANGIOGRAPHY N/A 07/19/2018   Procedure: LEFT HEART CATH AND CORONARY ANGIOGRAPHY;  Surgeon: Nigel Mormon, MD;  Location: [place] [facility];  Service: Cardiovascular;  Laterality: N/A;   MYOMECTOMY  2002    Family History  Problem Relation Age of Onset   Hypertension Mother    Cancer Mother        pancreatic cancer   Diabetes Mother    Hypertension Father    Diabetes Father    Cancer Father        prostate  cancer--metastasized 2021   Hypertension Sister    Thyroid nodules Sister    Diabetes Maternal Grandmother    Breast cancer Maternal Grandmother    Heart disease Maternal Grandmother    Heart disease Paternal Grandmother     Social History   Social History Narrative   Lives with husband. No pets. +passive tobacco exposure (husband smokes outside)     Review of Systems General: Denies fevers, chills, weight loss CV: Denies chest pain, shortness of breath, palpitations  Physical Exam Vitals with BMI 06/17/2021 06/11/2021 06/02/2021  Height 5\' 4"  5' 3.5" -  Weight 170 lbs 169 lbs 177 lbs 6 oz  BMI 29.17 10.25 85.27  Systolic 782 423 536  Diastolic 76 68 80  Pulse 84 80 99    General:  No acute distress,  Alert and oriented, Non-Toxic, Normal speech and affect Breast: She has grade 2 ptosis.  Sternal notch to nipple is 28 cm on the right and 29 cm on the left.  Nipple to fold is 16 cm in the right and 15 cm on the left.  No obvious scars or masses. Abdomen: Abdomen is soft nontender.  No obvious hernias.  No obvious scars.  She has mild to moderate excess skin and adipose tissue in the infra and supraumbilical areas.  Assessment/Plan The patient has bilateral symptomatic macromastia.  She is a good candidate for a breast reduction.  She is interested in pursuing surgical treatment.  She has tried supportive garments and fitted bras with no relief.  The details of breast reduction surgery were discussed.  I explained the procedure in detail along the with the expected scars.  The risks were discussed in detail and include bleeding, infection, damage to surrounding structures, need for additional procedures, nipple loss, change in nipple sensation, persistent pain, contour irregularities and asymmetries.  I explained that breast feeding is often not possible after breast reduction surgery.  We discussed the expected postoperative course with an overall recovery period of about 1 month.  She  demonstrated full understanding of all risks.  We discussed her personal risk factors that include her diabetes.  We discussed improving her blood sugar control to reduce her chances of having a wound problem.  She is also on prednisone and methotrexate for a cartilage inflammatory condition.  These may also affect her ability to heal but will check with her prescribing physician on whether or not she has to continue these in the perioperative period.  The patient is interested in pursuing surgical treatment.  I anticipate approximately 500g of tissue removed from each side.  Regarding her abdomen I think she is a good candidate for abdominoplasty.  We discussed risks include bleeding, infection, damage to surrounding structures need for  additional procedures.  We discussed the location and orientation of the scars.  We discussed the expected postoperative recovery..  We discussed the need for drains.  We discussed that intra-abdominal fat cannot be removed during this procedure but external structures could be modified to give her a better contour.   Cindra Presume 06/17/2021, 4:31 PM

## 2021-06-17 NOTE — Addendum Note (Signed)
Addended by: Cindra Presume on: 06/17/2021 04:54 PM   Modules accepted: Orders

## 2021-06-22 ENCOUNTER — Telehealth: Payer: Self-pay | Admitting: Plastic Surgery

## 2021-06-22 NOTE — Telephone Encounter (Signed)
Called patient to see if she had ever discussed her neck, back, shoulder pain with another provider or had any visits with a physical therapist or chiro. Patient stated she has spoken to her primary doctor about it and she saw a chiropractor last year on [address]. Advised patient to call the chiropractor office and request records for the surgery authorization. I did advise patient that we only have the notes of Dr. Claudia Desanctis which by itself will not be enough for her insurance to approve a surgery for reduction. Patient stated understanding and will have any chiro notes faxed to our office.

## 2021-07-20 DIAGNOSIS — M79644 Pain in right finger(s): Secondary | ICD-10-CM | POA: Diagnosis not present

## 2021-07-28 ENCOUNTER — Ambulatory Visit
Admission: RE | Admit: 2021-07-28 | Discharge: 2021-07-28 | Disposition: A | Discharge status: Home / Self Care | Payer: BC Managed Care – PPO | Source: Ambulatory Visit | Attending: Plastic Surgery | Admitting: Plastic Surgery

## 2021-07-28 DIAGNOSIS — N62 Hypertrophy of breast: Secondary | ICD-10-CM

## 2021-07-28 DIAGNOSIS — Z1231 Encounter for screening mammogram for malignant neoplasm of breast: Secondary | ICD-10-CM | POA: Diagnosis not present

## 2021-07-29 ENCOUNTER — Other Ambulatory Visit: Payer: Self-pay | Admitting: Plastic Surgery

## 2021-07-29 ENCOUNTER — Encounter: Payer: Self-pay | Admitting: *Deleted

## 2021-07-29 DIAGNOSIS — R928 Other abnormal and inconclusive findings on diagnostic imaging of breast: Secondary | ICD-10-CM

## 2021-08-03 ENCOUNTER — Other Ambulatory Visit: Payer: Self-pay | Admitting: Family Medicine

## 2021-08-03 ENCOUNTER — Other Ambulatory Visit: Payer: Self-pay

## 2021-08-03 ENCOUNTER — Telehealth: Payer: BC Managed Care – PPO | Admitting: Family Medicine

## 2021-08-03 ENCOUNTER — Other Ambulatory Visit (INDEPENDENT_AMBULATORY_CARE_PROVIDER_SITE_OTHER): Payer: BC Managed Care – PPO

## 2021-08-03 ENCOUNTER — Encounter: Payer: Self-pay | Admitting: Family Medicine

## 2021-08-03 ENCOUNTER — Other Ambulatory Visit: Payer: Self-pay | Admitting: Plastic Surgery

## 2021-08-03 VITALS — Temp 97.7°F | Ht 64.0 in | Wt 170.0 lb

## 2021-08-03 DIAGNOSIS — Z20822 Contact with and (suspected) exposure to covid-19: Secondary | ICD-10-CM | POA: Diagnosis not present

## 2021-08-03 DIAGNOSIS — R059 Cough, unspecified: Secondary | ICD-10-CM | POA: Diagnosis not present

## 2021-08-03 DIAGNOSIS — R0989 Other specified symptoms and signs involving the circulatory and respiratory systems: Secondary | ICD-10-CM

## 2021-08-03 DIAGNOSIS — J029 Acute pharyngitis, unspecified: Secondary | ICD-10-CM | POA: Diagnosis not present

## 2021-08-03 DIAGNOSIS — R928 Other abnormal and inconclusive findings on diagnostic imaging of breast: Secondary | ICD-10-CM

## 2021-08-03 LAB — POC COVID19 BINAXNOW: SARS Coronavirus 2 Ag: NEGATIVE

## 2021-08-03 LAB — POCT INFLUENZA A/B
Influenza A, POC: NEGATIVE
Influenza B, POC: NEGATIVE

## 2021-08-03 NOTE — Patient Instructions (Addendum)
Drink plenty of water. Take Mucinex DM (12 hour twice daily, or shorter-acting version, according to directions on box).  You tested negative for COVID and flu.  We sent off COVID for PCR test, as sometimes the rapid test can be falsely negative in the first couple of days of symptoms. If your COVID test is positive, we will sent in anti-viral medication to your pharmacy, and give you more details regarding isolation, return to work, etc. Best to stay out until you have test results.  Let us know date of last COVID booster so we can see if you're due for another (if you haven't had the bivalent).

## 2021-08-03 NOTE — Progress Notes (Signed)
Start time: 1:26 End time: 1:42  Virtual Visit via Video Note  I connected with Michelle Mack on 08/03/21 by a video enabled telemedicine application and verified that I am speaking with the correct person using two identifiers.  Location: Patient: home Provider: office   I discussed the limitations of evaluation and management by telemedicine and the availability of in person appointments. The patient expressed understanding and agreed to proceed.  History of Present Illness:  Chief Complaint  Patient presents with   Cough    VIRTUAL patient was exposed to covid on Sat and now is coughing and has a scratchy throat. Has a slight HA. No fever, chills or body aches. Her symptoms started yesterday.    She is complaining of a scratchy throat, and a cough different from her usual dry cough. "It sounds different". Cough is nonproductive.  Only started yesterday, not bad yet. Denies runny nose.  L eye periodically crusts  Was exposed to her hairdresser on Friday, unaware she had COVID at the time. Both she and the hairdresser were wearing masks (though patient's feel off a few times while shampooing).  Denies needing albuterol or feeling short of breath.  No sick contacts through children at work.  Has Dayquil/Nyquil at home, but hasn't been taking, knows she shouldn't due to HTN. Hasn't taken any OTC meds for her symptoms.  COVID vaccines, reports boosters x 2, didn't have card with her, unsure when last one was (states got all here, but only 3 documented in her chart currently).   PMH, PSH, SH reviewed  Outpatient Encounter Medications as of 08/03/2021  Medication Sig Note   ACCU-CHEK GUIDE test strip     amLODipine (NORVASC) 5 MG tablet Take 1 tablet (5 mg total) by mouth daily.    atorvastatin (LIPITOR) 20 MG tablet Take 1 tablet (20 mg total) by mouth daily.    Cholecalciferol (CVS D3) 50 MCG (2000 UT) CAPS TAKE 1 CAPSULE BY MOUTH EVERY DAY    folic acid (FOLVITE) 1  MG tablet Take by mouth.    metFORMIN (GLUCOPHAGE-XR) 500 MG 24 hr tablet 1,000 mg daily with breakfast.    methotrexate (RHEUMATREX) 2.5 MG tablet Take by mouth.    olmesartan-hydrochlorothiazide (BENICAR HCT) 20-12.5 MG tablet TAKE 1 TABLET BY MOUTH EVERY DAY    potassium chloride SA (KLOR-CON M20) 20 MEQ tablet Take 1 tablet (20 mEq total) by mouth daily.    predniSONE (DELTASONE) 5 MG tablet Take 10 mg by mouth daily with breakfast.    albuterol (PROVENTIL) (2.5 MG/3ML) 0.083% nebulizer solution Take 3 mLs (2.5 mg total) by nebulization every 6 (six) hours as needed for wheezing or shortness of breath. (Patient not taking: Reported on 08/03/2021) 08/03/2021: Uses as needed   albuterol (VENTOLIN HFA) 108 (90 Base) MCG/ACT inhaler Inhale 2 puffs into the lungs every 6 (six) hours as needed for wheezing or shortness of breath. (Patient not taking: Reported on 08/03/2021) 08/03/2021: Uses as needed   Spacer/Aero-Holding Chambers (AEROCHAMBER MV) inhaler Use as instructed (Patient not taking: Reported on 08/03/2021)    [DISCONTINUED] Dextromethorphan-guaiFENesin ([place] FAST-MAX DM MAX) 5-100 MG/5ML LIQD Take 20 mLs by mouth as needed. 06/11/2021: Last took yesterday   No facility-administered encounter medications on file as of 08/03/2021.   Allergies reviewed.   ROS:  No f/c/n/v/d, no chest pain, shortness of breath. No rashes, urinary problems. Denies myalgias. No of smell or taste. Mild URI symptoms per HPI.    Observations/Objective:  Temp 97.7 F (36.5 C) (  Temporal)    Ht 5\' 4"  (1.626 m)    Wt 170 lb (77.1 kg)    LMP 10/09/2015    BMI 29.18 kg/m   Pleasant, well-appearing female in no distress. She is alert and oriented. Normal speech. She doesn't sound congested, no sniffling, throat-clearing or coughing during the visit. L lateral eye--small focal area of conjuctival injection.  Otherwise clear. No swelling of eyes, just focal area of redness noted. Exam is limited due to virtual  nature of the visit.   Influenza A&B negative Rapid COVID negative   Assessment and Plan:  Exposure to COVID-19 virus - minimal exposure, both wearing masks.  Rapid test negative, await PCR.  supportive measures reviewed  Upper respiratory symptom  Pt requested flu testing in addition to [place].  We discussed treating with Molnupiravir if + COVID.  Yesterday was first day of sx, none Saturday. (Day 0 1/29).     Follow Up Instructions:    I discussed the assessment and treatment plan with the patient. The patient was provided an opportunity to ask questions and all were answered. The patient agreed with the plan and demonstrated an understanding of the instructions.   The patient was advised to call back or seek an in-person evaluation if the symptoms worsen or if the condition fails to improve as anticipated.  I spent 20 minutes dedicated to the care of this patient, including pre-visit review of records, face to face time, post-visit ordering of testing and documentation.    Vikki Ports, MD

## 2021-08-04 ENCOUNTER — Other Ambulatory Visit: Payer: Self-pay

## 2021-08-04 LAB — NOVEL CORONAVIRUS, NAA: SARS-CoV-2, NAA: DETECTED — AB

## 2021-08-04 LAB — SARS-COV-2, NAA 2 DAY TAT

## 2021-08-04 MED ORDER — MOLNUPIRAVIR EUA 200MG CAPSULE: 4.0000 | ORAL_CAPSULE | Freq: Two times a day (BID) | ORAL | 0 refills | Status: AC

## 2021-08-05 ENCOUNTER — Other Ambulatory Visit: Payer: Self-pay | Admitting: Family Medicine

## 2021-08-05 DIAGNOSIS — E7800 Pure hypercholesterolemia, unspecified: Secondary | ICD-10-CM

## 2021-08-05 DIAGNOSIS — I10 Essential (primary) hypertension: Secondary | ICD-10-CM

## 2021-08-10 ENCOUNTER — Other Ambulatory Visit: Payer: Self-pay | Admitting: Family Medicine

## 2021-08-10 DIAGNOSIS — M941 Relapsing polychondritis: Secondary | ICD-10-CM

## 2021-08-17 DIAGNOSIS — N3946 Mixed incontinence: Secondary | ICD-10-CM | POA: Diagnosis not present

## 2021-08-18 ENCOUNTER — Encounter: Payer: Self-pay | Admitting: Family Medicine

## 2021-08-18 ENCOUNTER — Ambulatory Visit: Payer: BC Managed Care – PPO | Admitting: Family Medicine

## 2021-08-18 VITALS — BP 112/68 | HR 83 | Temp 98.4°F | Wt 174.6 lb

## 2021-08-18 DIAGNOSIS — M25559 Pain in unspecified hip: Secondary | ICD-10-CM | POA: Diagnosis not present

## 2021-08-18 DIAGNOSIS — M941 Relapsing polychondritis: Secondary | ICD-10-CM | POA: Diagnosis not present

## 2021-08-18 DIAGNOSIS — R399 Unspecified symptoms and signs involving the genitourinary system: Secondary | ICD-10-CM

## 2021-08-18 DIAGNOSIS — N3091 Cystitis, unspecified with hematuria: Secondary | ICD-10-CM | POA: Diagnosis not present

## 2021-08-18 LAB — POCT URINALYSIS DIP (PROADVANTAGE DEVICE)
Bilirubin, UA: NEGATIVE
Glucose, UA: NEGATIVE mg/dL
Ketones, POC UA: NEGATIVE mg/dL
Nitrite, UA: POSITIVE — AB
Protein Ur, POC: NEGATIVE mg/dL
Specific Gravity, Urine: 1.02
Urobilinogen, Ur: 0.2
pH, UA: 6 (ref 5.0–8.0)

## 2021-08-18 MED ORDER — DOXYCYCLINE HYCLATE 100 MG PO TABS: 100.0000 mg | ORAL_TABLET | Freq: Two times a day (BID) | ORAL | 0 refills | Status: DC

## 2021-08-18 NOTE — Progress Notes (Addendum)
° °  Subjective:    Patient ID: Michelle Mack, female    DOB: [birth year], 56 y.o.   MRN: [id number]  HPI She states that she noted the 1 day history of having pain in the left hip, pelvic area and down her left leg.  Motion tends to make this worse.  No history of injury or overuse.  No numbness or tingling.  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  Review of Systems     Objective:   Physical Exam Alert and in no distress.  Normal motion of her back with some vague lower lumbar tenderness to palpation but nothing over a specific area.  Normal lumbar curve and motion.  Negative straight leg raising with normal DTRs.  Normal hip motion.  Normal sensation. 3.  [facility]      Assessment & Plan:  Hip pain  Relapsing polychondritis I explained that at this time there is no specific cause for her problem and conservative care would be appropriate with the use of 2 extra strength Tylenol 3 times per day and to Aleve twice per day for the next week or 2.  She is to keep track if any other symptoms that change and make an appointment if no improvement.   The urine was not seen until after she left.  Microscopic was positive for red cells as well as dipstick positive for nitrites.  I will place her on an antibiotic and she will need to return here in 2 weeks for follow-up. Hopefully this will take care of her symptoms although they did not sound urologic.

## 2021-08-18 NOTE — Patient Instructions (Signed)
2 Tylenol extra strength 3 times per day and you can also take 2 Aleve twice per day.  Do this for the next week or so and if it goes away fine if not then reschedule

## 2021-08-18 NOTE — Addendum Note (Signed)
Addended by: Denita Lung on: 08/18/2021 04:28 PM   Modules accepted: Orders

## 2021-08-18 NOTE — Addendum Note (Signed)
Addended by: Elyse Jarvis on: 08/18/2021 04:20 PM   Modules accepted: Orders

## 2021-08-19 ENCOUNTER — Other Ambulatory Visit: Payer: Self-pay | Admitting: Family Medicine

## 2021-08-19 ENCOUNTER — Ambulatory Visit
Admission: RE | Admit: 2021-08-19 | Discharge: 2021-08-19 | Disposition: A | Discharge status: Home / Self Care | Payer: BC Managed Care – PPO | Source: Ambulatory Visit | Attending: Family Medicine | Admitting: Family Medicine

## 2021-08-19 DIAGNOSIS — R928 Other abnormal and inconclusive findings on diagnostic imaging of breast: Secondary | ICD-10-CM

## 2021-08-19 DIAGNOSIS — R921 Mammographic calcification found on diagnostic imaging of breast: Secondary | ICD-10-CM | POA: Diagnosis not present

## 2021-08-19 DIAGNOSIS — R922 Inconclusive mammogram: Secondary | ICD-10-CM | POA: Diagnosis not present

## 2021-09-02 ENCOUNTER — Ambulatory Visit
Admission: RE | Admit: 2021-09-02 | Discharge: 2021-09-02 | Disposition: A | Discharge status: Home / Self Care | Payer: BC Managed Care – PPO | Source: Ambulatory Visit | Attending: Family Medicine | Admitting: Family Medicine

## 2021-09-02 DIAGNOSIS — R928 Other abnormal and inconclusive findings on diagnostic imaging of breast: Secondary | ICD-10-CM

## 2021-09-02 DIAGNOSIS — N6012 Diffuse cystic mastopathy of left breast: Secondary | ICD-10-CM | POA: Diagnosis not present

## 2021-09-02 DIAGNOSIS — R921 Mammographic calcification found on diagnostic imaging of breast: Secondary | ICD-10-CM | POA: Diagnosis not present

## 2021-09-08 DIAGNOSIS — Z7984 Long term (current) use of oral hypoglycemic drugs: Secondary | ICD-10-CM | POA: Diagnosis not present

## 2021-09-08 DIAGNOSIS — E1165 Type 2 diabetes mellitus with hyperglycemia: Secondary | ICD-10-CM | POA: Diagnosis not present

## 2021-09-08 DIAGNOSIS — Z7952 Long term (current) use of systemic steroids: Secondary | ICD-10-CM | POA: Diagnosis not present

## 2021-09-08 DIAGNOSIS — E785 Hyperlipidemia, unspecified: Secondary | ICD-10-CM | POA: Diagnosis not present

## 2021-09-08 LAB — HEMOGLOBIN A1C: Hemoglobin A1C: 8.4

## 2021-09-14 ENCOUNTER — Encounter: Payer: Self-pay | Admitting: Family Medicine

## 2021-09-28 ENCOUNTER — Ambulatory Visit: Payer: BC Managed Care – PPO | Admitting: Family Medicine

## 2021-09-28 ENCOUNTER — Encounter: Payer: Self-pay | Admitting: Family Medicine

## 2021-09-28 VITALS — BP 112/70 | HR 64 | Temp 98.1°F | Ht 64.0 in | Wt 174.0 lb

## 2021-09-28 DIAGNOSIS — R350 Frequency of micturition: Secondary | ICD-10-CM

## 2021-09-28 DIAGNOSIS — R8290 Unspecified abnormal findings in urine: Secondary | ICD-10-CM | POA: Diagnosis not present

## 2021-09-28 LAB — POCT URINALYSIS DIP (PROADVANTAGE DEVICE)
Bilirubin, UA: NEGATIVE
Glucose, UA: NEGATIVE mg/dL
Ketones, POC UA: NEGATIVE mg/dL
Leukocytes, UA: NEGATIVE
Nitrite, UA: NEGATIVE
Protein Ur, POC: NEGATIVE mg/dL
Specific Gravity, Urine: 1.02
Urobilinogen, Ur: NEGATIVE
pH, UA: 6 (ref 5.0–8.0)

## 2021-09-28 NOTE — Patient Instructions (Signed)
We are going to send your urine for culture to confirm whether or not there is infection. ?Odor doesn't necessarily mean infection. ?You will notice stronger odor when the urine is more concentrated. ?Please try and drink more water (such that your urine is very pale yellow or clear). ?Sometimes you can notice odor due to things that are excreted in the urine, such as vitamins, or even with certain foods (asparagus is the most common food to affect the scent of the urine). ? ?I couldn't tell from what you showed me if your referral was for physical therapy, or to see an MD at urogynecology.  You mentioned wanting to get seen sooner by [facility].  You should ask your GYN for referral (as I'm unsure if you need referral to the urologist vs their PT). They may be able to see you sooner than May. ?

## 2021-09-28 NOTE — Progress Notes (Signed)
Chief Complaint  ?Patient presents with  ? possible UTI  ?  Possible UTI. Back in February she was put on antibiotic for urinary issues. Odor, frequency, no burning, no itching  ? ? ?She saw Dr. Redmond School 2/14 with L hip pain. Urine was checked, +nit, blood and leuks. She was treated with doxycycline.  She wasn't having any odor or urgency prior to taking the antibiotics. ?Patient states that she noticed urinary urgency started a week after completing the last antibiotic. (1 month ago). Denies any change or worsening. ?She has also noted a more pungent, strong odor starting at the same time. ?Denies any vaginal discharge or itching. ?She also reports large volume incontinence with sneezing or coughing. ?She reports she has discussed this with her GYN, and she was referred to urogynecology at [facility].  Her appt isn't until May.  We did not get any notes from her GYN--unsure if being referred for pelvic floor PT, or to see a doctor. ?She is hoping to get seen sooner, as she is quite frustrated with the leakage. She called [facility] herself, but needed a referral. ? ?She denies any caffeine intake, only rare alcohol. ?She tries to do Kegel's daily. ? ?PMH, PSH, SH reviewed ? ?Outpatient Encounter Medications as of 09/28/2021  ?Medication Sig Note  ? ACCU-CHEK GUIDE test strip    ? albuterol (PROVENTIL) (2.5 MG/3ML) 0.083% nebulizer solution Take 3 mLs (2.5 mg total) by nebulization every 6 (six) hours as needed for wheezing or shortness of breath. 08/03/2021: Uses as needed  ? albuterol (VENTOLIN HFA) 108 (90 Base) MCG/ACT inhaler Inhale 2 puffs into the lungs every 6 (six) hours as needed for wheezing or shortness of breath. 08/03/2021: Uses as needed  ? amLODipine (NORVASC) 5 MG tablet TAKE 1 TABLET (5 MG TOTAL) BY MOUTH DAILY.   ? atorvastatin (LIPITOR) 20 MG tablet TAKE 1 TABLET BY MOUTH EVERY DAY   ? Cholecalciferol (CVS D3) 50 MCG (2000 UT) CAPS TAKE 1 CAPSULE BY MOUTH EVERY DAY   ? metFORMIN (GLUCOPHAGE-XR) 500 MG 24 hr  tablet 1,000 mg daily with breakfast.   ? methotrexate (RHEUMATREX) 2.5 MG tablet Take by mouth.   ? olmesartan-hydrochlorothiazide (BENICAR HCT) 20-12.5 MG tablet TAKE 1 TABLET BY MOUTH EVERY DAY   ? predniSONE (DELTASONE) 5 MG tablet Take 10 mg by mouth daily with breakfast.   ? [DISCONTINUED] potassium chloride SA (KLOR-CON M20) 20 MEQ tablet Take 1 tablet (20 mEq total) by mouth daily.   ? Spacer/Aero-Holding Chambers (AEROCHAMBER MV) inhaler Use as instructed   ? [DISCONTINUED] cyclobenzaprine (FLEXERIL) 5 MG tablet Take 1 mg by mouth 2 (two) times daily as needed. (Patient not taking: Reported on 08/18/2021)   ? [DISCONTINUED] doxycycline (VIBRA-TABS) 100 MG tablet Take 1 tablet (100 mg total) by mouth 2 (two) times daily.   ? [DISCONTINUED] folic acid (FOLVITE) 1 MG tablet Take by mouth.   ? ?No facility-administered encounter medications on file as of 09/28/2021.  ? ?Allergies reviewed ? ? ?ROS: no fever, chills, dysuria, abdominal pain, hematuria, vaginal discharge or vaginal itching.  +frequency and odor per HPI.  No flank pain. ?No n/v/d or other complaints, except as noted in HPI. ? ? ?PHYSICAL EXAM: ? ?BP 112/70   Pulse 64   Temp 98.1 ?F (36.7 ?C) (Tympanic)   Ht '5\' 4"'$  (1.626 m)   Wt 174 lb (78.9 kg)   LMP 10/09/2015   BMI 29.87 kg/m?  ? ?Wt Readings from Last 3 Encounters:  ?09/28/21 174 lb (78.9  kg)  ?08/18/21 174 lb 9.6 oz (79.2 kg)  ?08/03/21 170 lb (77.1 kg)  ? ?Pleasant female, in no distress ?HEENT: conjunctiva and sclera are clear, EOMI, wearing mask ?Heart: regular rate and rhythm ?Lungs: clear bilaterally ?Back: no CVA tenderness ?Abdomen: no organomegaly or mass, no suprapubic tenderness. ?Extremities: no edema ? ?Urine: large blood, negative protein, negative nitrite, negative leuks, large blood (chronic). ?SG 1.020 ? ?ASSESSMENT/PLAN: ? ?Urinary frequency - This is chronic, not progressive. She has stress incontinence and prob weakened pelvic floor. f/u as planned with referral from GYN   - Plan: POCT Urinalysis DIP (Proadvantage Device), Urine Culture ? ?Abnormal urine odor - Discussed Ddx. Urine was concentrated. Encouraged increased fluid intake. Send for culture to r/o infection - Plan: Urine Culture ? ?Discussed causes of odor in the urine. ?Discussed typical sx of UTI.  ?She is having symptoms which have been chronic, nonprogressive, and not typical for infection.  Will check culture. ?Kegels recommended and discussed. ? ?GYN notes not received. Unsure if plan is for pelvic floor PT vs urogyn consult. Pt wants to be seen sooner--she should ask GYN for this for [facility]. ? ? ?We are going to send your urine for culture to confirm whether or not there is infection. ?Odor doesn't necessarily mean infection. ?You will notice stronger odor when the urine is more concentrated. ?Please try and drink more water (such that your urine is very pale yellow or clear). ?Sometimes you can notice odor due to things that are excreted in the urine, such as vitamins, or even with certain foods (asparagus is the most common food to affect the scent of the urine). ? ?I couldn't tell from what you showed me if your referral was for physical therapy, or to see an MD at urogynecology.  You mentioned wanting to get seen sooner by [facility].  You should ask your GYN for referral (as I'm unsure if you need referral to the urologist vs their PT). They may be able to see you sooner than May. ? ?I spent 32 minutes dedicated to the care of this patient, including pre-visit review of records, face to face time, post-visit ordering of testing and documentation. ? ?

## 2021-09-30 LAB — URINE CULTURE

## 2021-10-01 ENCOUNTER — Encounter: Payer: Self-pay | Admitting: Family Medicine

## 2021-10-01 ENCOUNTER — Ambulatory Visit: Payer: BC Managed Care – PPO | Admitting: Family Medicine

## 2021-10-01 VITALS — BP 112/76 | HR 72 | Ht 64.0 in | Wt 177.6 lb

## 2021-10-01 DIAGNOSIS — R319 Hematuria, unspecified: Secondary | ICD-10-CM

## 2021-10-01 DIAGNOSIS — N939 Abnormal uterine and vaginal bleeding, unspecified: Secondary | ICD-10-CM

## 2021-10-01 LAB — POCT URINALYSIS DIP (PROADVANTAGE DEVICE)
Bilirubin, UA: NEGATIVE
Glucose, UA: NEGATIVE mg/dL
Ketones, POC UA: NEGATIVE mg/dL
Leukocytes, UA: NEGATIVE
Nitrite, UA: NEGATIVE
Protein Ur, POC: NEGATIVE mg/dL
Specific Gravity, Urine: 1.025
Urobilinogen, Ur: NEGATIVE
pH, UA: 5 (ref 5.0–8.0)

## 2021-10-01 NOTE — Patient Instructions (Signed)
The source of your bleeding is external to your vagina and urethra. ?On the right side of the external genitalia there were 3 different areas that were red and looked like they had recently bled.  ?There was no active bleeding. ? ?Try and leave this area alone (not rubbing, scrubbing, scratching), just pat dry when wiping. ?Give this some time to heal up. ?If you have any ongoing redness, bleeding or irritation, you should see your gynecologist in follow-up to discuss next steps.  ? ?The urine was the same as last time, nothing to suggest infection (just the microscopic blood). ? ? ?

## 2021-10-01 NOTE — Progress Notes (Signed)
Chief Complaint  ?Patient presents with  ? Hematuria  ?  Blood in urine and also when she wipes after urinating. Was there this am but not this time when she gave [place] the sample. No pain or discomfort.   ? ?Today she noted red blood in the toilet, streaks mixed in the water (water was not solid red). ?She noticed BRB on the toilet paper. ?She reports the bleeding was from vaginal area, not rectal area. ?Denies any irritation externally. ?Denies vaginal discharge or itching. ?Denies recent sexual activity, or any other penetration or potential trauma to the vaginal area. ? ?Urinary urgency and frequency remains the same (not worse, has been this way >1 month).  Odor remains as well. ? ?She had contacted her GYN's office to inquire about sooner referral to [facility] (for her urogyn issues), and is waiting on a call back. ? ? ?PMH, PSH, SH reviewed ? ?Outpatient Encounter Medications as of 10/01/2021  ?Medication Sig Note  ? ACCU-CHEK GUIDE test strip    ? amLODipine (NORVASC) 5 MG tablet TAKE 1 TABLET (5 MG TOTAL) BY MOUTH DAILY.   ? atorvastatin (LIPITOR) 20 MG tablet TAKE 1 TABLET BY MOUTH EVERY DAY   ? Cholecalciferol (CVS D3) 50 MCG (2000 UT) CAPS TAKE 1 CAPSULE BY MOUTH EVERY DAY   ? metFORMIN (GLUCOPHAGE-XR) 500 MG 24 hr tablet 1,000 mg daily with breakfast. 10/01/2021: Taking 1000 qam and 1000 qpm  ? olmesartan-hydrochlorothiazide (BENICAR HCT) 20-12.5 MG tablet TAKE 1 TABLET BY MOUTH EVERY DAY   ? predniSONE (DELTASONE) 5 MG tablet Take 10 mg by mouth daily with breakfast.   ? Spacer/Aero-Holding Chambers (AEROCHAMBER MV) inhaler Use as instructed   ? [DISCONTINUED] methotrexate (RHEUMATREX) 2.5 MG tablet Take by mouth.   ? albuterol (PROVENTIL) (2.5 MG/3ML) 0.083% nebulizer solution Take 3 mLs (2.5 mg total) by nebulization every 6 (six) hours as needed for wheezing or shortness of breath. (Patient not taking: Reported on 10/01/2021) 08/03/2021: Uses as needed  ? albuterol (VENTOLIN HFA) 108 (90 Base) MCG/ACT  inhaler Inhale 2 puffs into the lungs every 6 (six) hours as needed for wheezing or shortness of breath. (Patient not taking: Reported on 10/01/2021) 08/03/2021: Uses as needed  ? ?No facility-administered encounter medications on file as of 10/01/2021.  ? ?Allergies reviewed ? ?ROS:  no fever, chills, URI symptoms. Urinary symptoms are unchanged from earlier in the week, but blood noted in toilet and with wiping today, per HPI ? ? ?PHYSICAL EXAM: ? ?BP 112/76   Pulse 72   Ht '5\' 4"'$  (1.626 m)   Wt 177 lb 9.6 oz (80.6 kg)   LMP 10/09/2015   BMI 30.48 kg/m?  ? ?Well-appearing, pleasant female in no distress ?External genitalia--no external lesions. ?On the right side, along labia minora there are three different areas that are erythematous and appear to possible be oozing or recently bled.  There was no active bleeding, no oozing when dabbed with gauze.  The more anterior location looks like it could have been a mild fissure/cut that is healing (no active bleeding)--patient was more sensitive over that area, not sensitive over the other 2 areas of erythema. ?At the vaginal introitus, things appeared normal, no discharge or lesions. ? ? ?Urine dip: SG 1.025, large blood, negative leuks, nit, protein ? ? ?ASSESSMENT/PLAN: ? ?Hematuria, unspecified type - chronic microscopic hematuria. Blood noted today was from vaginal area, no from urinary tract. Pt reassured. Urine unchanged from earlier in the week - Plan: POCT Urinalysis DIP (Proadvantage  Device) ? ?Vaginal bleeding - There appears to be some superficial trauma/irritation on the R side as source of BRB. No active bleeding now. Pt denies trauma. f/u with GYN if persists ? ?There doesn't appear to be any ulceration or suspicion for cancer. ?Suspect atrophic vaginitis contributing. ?Unsure why it would spontaneously bleed, denies trauma. ?Advised to be very gentle--pat dry only. ?Already no longer bleeding, except to heal quickly. ?F/u with GYN if persists/worsens or  for re-eval in 1-2 weeks (vs letting urology eval when she sees them, waiting on referral). ? ?

## 2021-10-02 ENCOUNTER — Other Ambulatory Visit: Payer: BC Managed Care – PPO

## 2021-10-02 ENCOUNTER — Encounter: Payer: Self-pay | Admitting: Physician Assistant

## 2021-10-02 ENCOUNTER — Telehealth: Payer: BC Managed Care – PPO | Admitting: Physician Assistant

## 2021-10-02 VITALS — BP 112/76 | Wt 177.0 lb

## 2021-10-02 DIAGNOSIS — Z20822 Contact with and (suspected) exposure to covid-19: Secondary | ICD-10-CM

## 2021-10-02 NOTE — Patient Instructions (Signed)
Rest, increase clear fluids, OTC Tylenol (acetamenophen) for body aches, headaches, fever, chills as needed.  ? ?

## 2021-10-02 NOTE — Progress Notes (Signed)
Start time: 10:17 am ?End time: 10:35 am ? ?Virtual Visit via Video Note ? ? Patient ID: Michelle Mack, female    DOB: [birth year], 56 y.o.   MRN: [id number] ? ?I connected with above patient on 10/02/21 by a video enabled telemedicine application and verified that I am speaking with the correct person using two identifiers. ? ?Location: ?Patient: home ?Provider: office ?  ?I discussed the limitations of evaluation and management by telemedicine and the availability of in person appointments. The patient expressed understanding and agreed to proceed. ? ?History of Present Illness: ? ?Chief Complaint  ?Patient presents with  ? Acute Visit  ?  Virtual- Covid exposure pt said she just found out yesterday evening. She does have a cough and Diarrhea. Wants to have a Pcr done. Home covid test taken yesterday and was negative.  ? ?Reports that she was exposed to students and coworker with COVID all this week; had a negative home COVID test last night; has a 1 day history of a cough, non-productive; had 1 episode of diarrhea this morning; no fever or headache; works in a home daycare; requests to have a PCR COVID test done.  ? ?  ?Observations/Objective: ? ?BP 112/76   Wt 177 lb (80.3 kg)   LMP 10/09/2015   BMI 30.38 kg/m?  ? ? ?Assessment: ?Encounter Diagnosis  ?Name Primary?  ? Exposure to COVID-19 virus Yes  ? ? ? ?Plan: ?Come to the office for a PCR COVID test.  ?Rest, increase clear fluids, OTC Tylenol (acetamenophen) for body aches, headaches, fever, chills as needed.   ? ?[name] was seen today for acute visit. ? ?Diagnoses and all orders for this visit: ? ?Exposure to COVID-19 virus ?-     Novel Coronavirus, NAA ([facility]); Future ? ? ? ?Follow up: as already scheduled with Dr. Tomi Bamberger. ? ? ?I discussed the assessment and treatment plan with the patient. The patient was provided an opportunity to ask questions and all were answered. The patient agreed with the plan and demonstrated an understanding of the  instructions. ?  ?The patient was advised to call back or seek an in-person evaluation if the symptoms worsen or if the condition fails to improve as anticipated. For emergencies go to Urgent Care or the Emergency Department for immediate evaluation.  ? ?I spent 15 minutes dedicated to the care of this patient, including pre-visit review of records, face to face time, post-visit ordering of testing and documentation. ? ? ? ?Irene Pap, PA-C ?

## 2021-10-03 LAB — NOVEL CORONAVIRUS, NAA: SARS-CoV-2, NAA: NOT DETECTED

## 2021-10-15 ENCOUNTER — Other Ambulatory Visit (HOSPITAL_COMMUNITY): Payer: Self-pay | Admitting: Gastroenterology

## 2021-10-15 ENCOUNTER — Ambulatory Visit: Payer: BC Managed Care – PPO | Admitting: Family Medicine

## 2021-10-15 DIAGNOSIS — R1011 Right upper quadrant pain: Secondary | ICD-10-CM

## 2021-10-15 DIAGNOSIS — K5904 Chronic idiopathic constipation: Secondary | ICD-10-CM | POA: Diagnosis not present

## 2021-10-15 DIAGNOSIS — R140 Abdominal distension (gaseous): Secondary | ICD-10-CM | POA: Diagnosis not present

## 2021-10-15 DIAGNOSIS — R110 Nausea: Secondary | ICD-10-CM | POA: Diagnosis not present

## 2021-10-22 ENCOUNTER — Encounter (HOSPITAL_COMMUNITY): Payer: Self-pay

## 2021-10-22 ENCOUNTER — Ambulatory Visit (HOSPITAL_COMMUNITY): Payer: BC Managed Care – PPO

## 2021-10-26 ENCOUNTER — Ambulatory Visit: Payer: BC Managed Care – PPO | Admitting: Internal Medicine

## 2021-10-26 ENCOUNTER — Telehealth: Payer: Self-pay | Admitting: *Deleted

## 2021-10-26 ENCOUNTER — Encounter: Payer: Self-pay | Admitting: Internal Medicine

## 2021-10-26 VITALS — BP 122/68 | HR 73 | Ht 64.0 in | Wt 175.4 lb

## 2021-10-26 DIAGNOSIS — M941 Relapsing polychondritis: Secondary | ICD-10-CM

## 2021-10-26 DIAGNOSIS — R0789 Other chest pain: Secondary | ICD-10-CM | POA: Diagnosis not present

## 2021-10-26 DIAGNOSIS — R0602 Shortness of breath: Secondary | ICD-10-CM | POA: Diagnosis not present

## 2021-10-26 MED ORDER — PREDNISONE 10 MG PO TABS: 10.0000 mg | ORAL_TABLET | Freq: Every day | ORAL | 3 refills | Status: DC

## 2021-10-26 MED ORDER — METHOTREXATE SODIUM 2.5 MG PO TABS: 12.5000 mg | ORAL_TABLET | ORAL | 3 refills | Status: DC

## 2021-10-26 MED ORDER — ALBUTEROL SULFATE HFA 108 (90 BASE) MCG/ACT IN AERS: 2.0000 | INHALATION_SPRAY | Freq: Four times a day (QID) | RESPIRATORY_TRACT | 3 refills | Status: DC | PRN

## 2021-10-26 MED ORDER — ALBUTEROL SULFATE (2.5 MG/3ML) 0.083% IN NEBU: 2.5000 mg | INHALATION_SOLUTION | Freq: Four times a day (QID) | RESPIRATORY_TRACT | 3 refills | Status: DC | PRN

## 2021-10-26 NOTE — Telephone Encounter (Signed)
I read note--she was seen today, listed as "self-referred". ?Pt hadn't contacted [place] regarding symptoms or need for referral.  Not sure if it was [place] wanting to not have a self-referral, or if it is related to insurance.  Since they clearly saw her, we should only need to do a referral if her INSURANCE requires it. ?

## 2021-10-26 NOTE — Telephone Encounter (Signed)
Patient is at [facility]. She used to see Dr. Chase Caller there. She scheduled an appt for toay for wheezing with another provider and she did not know she would be a new patient. She called here asking for you to please send a referral so they can attach to the visit.  ?

## 2021-10-26 NOTE — Patient Instructions (Addendum)
Please schedule follow up scheduled with myself in 1 months.  If my schedule is not open yet, we will contact you with a reminder closer to that time. Please call [phone] if you haven't heard from [place] a month before.  ? ?Before your next visit I would like you to have: ? ?Full set of PFTs - 1 hour ? ?Start taking albuterol rescue inhaler as needed. I have sent the inhaler and nebulizer to your pharmac.  ? ?

## 2021-10-26 NOTE — Progress Notes (Signed)
? ?      ?Michelle Mack    [id number]    18-Jul-1965 ? ?Primary Care Physician:Knapp, Eve, MD ?Date of Appointment: 10/26/2021 ?New Patient Evaluation, Self Referred ? ?Chief complaint:   ?Chief Complaint  ?Patient presents with  ? Consult  ?  Self referral for increased wheezing and SOB with exertion. States she does cough but has not noticed a difference in the coughing.    ? ? ? ?HPI: ? ?Michelle Mack is a 56 y.o. woman who presents for new patient evaluation of shortness of breath. She was seen in our practice over three years ago and now presents as a new patient. She has a past medical history of relapsing polychndritis and is on prednisone and methotrexate. 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 Patient was initially biopsied at [facility] demonstrating areas of necrosis. Currently on mtx 12.5 mg weekly with folic acid and prendisone 10 mg daily. Has been on prednisone 10 mg for about ten years since her initial diagnosis. Now having worsening shortness of breath, chest tightness, wheezing. Off all inhaler therapy.  ? 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  ?Symptoms have been going on for about a week.  ?No fevers or chills.  ?She is coughing a little but nothing out of ordinary, no mucus production.  ? ?No childhood respiratory disease.  ?Never smoker.  ? ?Social History: ? 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  ?Exposures: lives at home with husband. No pets. 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  ?Smoking history: never smoker.  ? ?Social History  ? ?Occupational History  ? Occupation: Therapist, art at [employer]  ?  Employer: [employer]  ?Tobacco Use  ? Smoking status: Never  ? Smokeless tobacco: Never  ?Vaping Use  ? Vaping Use: Never used  ?Substance and Sexual Activity  ? Alcohol use: Not Currently  ?   Comment: 0-1 drink/week  ? Drug use: No  ? Sexual activity: Not Currently  ?  Birth control/protection: None  ?  Comment: not in the last year  ? ? ?Relevant family history: ? ?Family History  ?Problem Relation Age of Onset  ? Hypertension Mother   ? Cancer Mother   ?     pancreatic cancer  ? Diabetes Mother   ? Hypertension Father   ? Diabetes Father   ? Cancer Father   ?     prostate cancer--metastasized 2021  ? Hypertension Sister   ? Thyroid nodules Sister   ? Diabetes Maternal Grandmother   ? Breast cancer Maternal Grandmother   ? Heart disease Maternal Grandmother   ? Heart disease Paternal Grandmother   ? ? ?Past Medical History:  ?Diagnosis Date  ? Anemia   ? on iron  ? Back pain   ? tx with ibuprofen 886m  ? Bronchitis   ? treated recently by abx  ? Cough   ? non-productive  ? DUB (dysfunctional uterine bleeding)   ? Fibroid uterus   ? GYN--Dr. RCletis Media ? GERD (gastroesophageal reflux disease)   ? Hypertension   ? Infertility, female   ? OA (osteoarthritis) of knee 04/02/2020  ? Mod osteoarthritis, [facility] L [facility]  Cortisone injection by Dr. BLayne Benton9/1/21  ? Polychondritis   ? Recurrent upper respiratory infection (  URI)   ? in October - tx with abx  ? Type II or unspecified type diabetes mellitus without mention of complication, not stated as uncontrolled   ? diagnosed by Dr. Berdine Addison  ? ? ?Past Surgical History:  ?Procedure Laterality Date  ? BRONCHOSCOPY  01/2014  ? at [facility] expiratory collapse of trachea and focal narrowing in L mainstem bronchus and LUL  ? ENDOMETRIAL VAPORIZATION W/ VERSAPOINT    ? LEFT HEART CATH AND CORONARY ANGIOGRAPHY N/A 07/19/2018  ? Procedure: LEFT HEART CATH AND CORONARY ANGIOGRAPHY;  Surgeon: Nigel Mormon, MD;  Location: [place] [facility];  Service: Cardiovascular;  Laterality: N/A;  ? MYOMECTOMY  2002  ? ? ?Physical Exam: ?Blood pressure 122/68, pulse 73, height _0  (1.626 m), weight 175 lb 6.4 oz (79.6 kg), last  menstrual period 10/09/2015, SpO2 98 %. ?Gen:      No acute distress ?ENT:  no nasal polyps, mucus membranes moist ?Lungs:    No increased respiratory effort, symmetric chest wall excursion, clear to auscultation bilaterally, no wheezes or crackles ?CV:         Regular rate and rhythm; no murmurs, rubs, or gallops.  No pedal edema ?MSK: no acute synovitis of DIP or PIP joints, no mechanics hands.  ?Skin:      Warm and dry; no rashes ?Neuro: normal speech, no focal facial asymmetry ?Psych: alert and oriented x3, normal mood and affect ? ?Data Reviewed/Medical Decision Making: ? ?Independent interpretation of tests: ?Imaging: ? Review of patient's CT Chest in 2019 images revealed no acute process, mild air trapping. The patient's images have been independently reviewed by me.   ? ?CT Scan Chest 2018 ?1.  Similar thickening and calcification of the anterior and lateral walls of the trachea and portions of the mainstem bronchi consistent with known diagnosis of relapsing polychondritis.  ?2.  No significant stenosis of the trachea. Similar mild narrowing of the bilateral mainstem bronchi.  ?3.  No pulmonary air trapping or collapse. ? ?PFTs: ?I have personally reviewed the patient's PFTs and in Jan 2021 showed moderate airflow limitation FEV1 68% of predicted.  ? ?   ? View : No data to display.  ?  ?  ?  ? ? ?Labs:  ?Lab Results  ?Component Value Date  ? WBC 14.4 (H) 01/18/2020  ? HGB 12.5 01/18/2020  ? HCT 39.6 01/18/2020  ? MCV 88.8 01/18/2020  ? PLT 312 01/18/2020  ? ?Lab Results  ?Component Value Date  ? NA 142 09/03/2020  ? K 3.7 09/03/2020  ? CL 102 09/03/2020  ? CO2 24 09/03/2020  ? ? ? ?Immunization status:  ?Immunization History  ?Administered Date(s) Administered  ? Hepatitis A 12/27/2007, 01/24/2008, 07/17/2008  ? Hepatitis B 12/27/2007, 01/24/2008, 07/17/2008  ? Hepatitis B, ped/adol 12/27/2007, 01/24/2008, 07/17/2008  ? IPV 12/27/2007  ? MMR 12/27/2007  ? Meningococcal Polysaccharide 12/27/2007  ?  PFIZER(Purple Top)SARS-COV-2 Vaccination 09/18/2019, 10/09/2019, 06/17/2020  ? PPD Test 04/14/2011, 06/08/2013, 01/01/2015, 03/08/2017  ? Td 12/28/1995  ? Tdap 12/27/2007, 04/11/2019  ? Typhoid Inactivated 12/27/2007  ? Varicella 12/27/2007  ? Yellow Fever 01/24/2008  ? ? ? I reviewed prior external note(s) from [facility] PCCM, family medicine ? I reviewed the result(s) of the labs and imaging as noted above.  ? I have ordered pfts, albuterol ? ? ?Assessment:  ?Relapsing Polychondritis with Respiratory Involvement ?Shortness of breath, wheezing ? ?Plan/Recommendations: ? ?Concern for progression of RP.  ?Will obtain a full set of pfts.  ?May need  additional CT scan and even airway exam to assess for disease progression. She had mild airflow limitation on her PFTs back in 2021 done at [facility].  ? ?Start albuterol inhaler prn with nebulizer and MDI ? ?We discussed disease management and progression at length today.  ? ?I spent 45 minutes in the care of this patient today including pre-charting, chart review, review of results, face-to-face care, coordination of care and communication with consultants etc.). ? ?Return in about 4 weeks (around 11/23/2021). ? ? ?Lenice Llamas, MD ?Pulmonary and Critical Care Medicine ?[place] ?Office:[phone] ? ?

## 2021-10-27 ENCOUNTER — Ambulatory Visit: Payer: BC Managed Care – PPO | Admitting: Family Medicine

## 2021-10-27 ENCOUNTER — Encounter: Payer: Self-pay | Admitting: Family Medicine

## 2021-10-27 VITALS — BP 112/66 | HR 72 | Ht 64.0 in | Wt 181.0 lb

## 2021-10-27 DIAGNOSIS — M941 Relapsing polychondritis: Secondary | ICD-10-CM

## 2021-10-27 DIAGNOSIS — M79644 Pain in right finger(s): Secondary | ICD-10-CM | POA: Diagnosis not present

## 2021-10-27 DIAGNOSIS — M25531 Pain in right wrist: Secondary | ICD-10-CM | POA: Diagnosis not present

## 2021-10-27 NOTE — Patient Instructions (Signed)
?  Right wrist and thumb pain-- ?Exam suggests DeQuervain's tenosynovitis and possible carpal tunnel syndrome. ?I encourage you to wear the brace (with thumb spica) from [facility] as often as possible. ?We are referring you to Dr. Tempie Donning (hoping to get in sooner than 5/15 appt with the [place]). ?He will do his own assessment, and see if you may be a candidate for any injections to treat your pain. ? ? ?

## 2021-10-27 NOTE — Telephone Encounter (Signed)
Patient said the they wanted her to get a referral just in case the insurance would not pay so sounds like for insurance reasons.  ?

## 2021-10-27 NOTE — Progress Notes (Signed)
Chief Complaint  ?Patient presents with  ? Hand Pain  ?  Right hand pain, from thumb down her wrist. Went to ortho [facility]) and they did and Xray and said it's probably her autoimmune disease.   ? ?Started as pain in the R thumb about 3 weeks ago . Saw ortho at [facility] and was given a brace (which has thumb spica, wrist/hand coverage, and per pt extends up to the elbow). She left it in the car. ? ?No medications or injections given.  She states she was told to see an autoimmune doctor. No records have been received. ? ?She reports she wears the brace, but has to take it off for driving, doesn't wear it sleeping.  She only wears it at the daycare. She can't cook, drive, or do other activities while wearing it, so takes it off frequently. ? ?Now she has pain at the base of the wrist (both palmar and dorsal aspects of the wrist). ?Hurts to open things, hold things. Doesn't hurt with simple movement (flexion/extension). ?She thought it looked a little swollen 2 days ago. ? ?She has tried both ice and heat, which both help some. ?She is chronically on prednisone, hasn't increased dose. ? ?Fingers fall asleep only at night, in certain positions, no other times, never during the day. ?She can't open or lift things, or even screw/unscrew bottles--states due to weakness, not due to pain. ? ?She called the [place] on [facility], but was not able to get seen until 5/15. ? ?She saw pulmonary yesterday.  She hasn't picked up meds yet from pharmacy. ?Breathing today is fine; feels wheezy with walking or laying down. ? ?She stated they are waiting on study results (ordered by pulmonary yesterday) before referring back to rheumatologist.  She previously saw rheum at [facility] (Dr. Ang, who had left, never saw replacement), dx relapsing polychondritis. ? ? ? ?PMH, PSH, SH reviewed ? ?Outpatient Encounter Medications as of 10/27/2021  ?Medication Sig Note  ? amLODipine (NORVASC) 5 MG tablet TAKE 1 TABLET (5 MG TOTAL) BY MOUTH  DAILY.   ? atorvastatin (LIPITOR) 20 MG tablet TAKE 1 TABLET BY MOUTH EVERY DAY   ? Cholecalciferol ([employer] D3) 50 MCG (2000 UT) CAPS TAKE 1 CAPSULE BY MOUTH EVERY DAY   ? metFORMIN (GLUCOPHAGE-XR) 500 MG 24 hr tablet 1,000 mg daily with breakfast. 10/01/2021: Taking 1000 qam and 1000 qpm  ? olmesartan-hydrochlorothiazide (BENICAR HCT) 20-12.5 MG tablet TAKE 1 TABLET BY MOUTH EVERY DAY   ? predniSONE (DELTASONE) 10 MG tablet Take 1 tablet (10 mg total) by mouth daily with breakfast.   ? albuterol (PROVENTIL) (2.5 MG/3ML) 0.083% nebulizer solution Take 3 mLs (2.5 mg total) by nebulization every 6 (six) hours as needed for wheezing or shortness of breath. (Patient not taking: Reported on 10/27/2021) 10/27/2021: prn  ? albuterol (VENTOLIN HFA) 108 (90 Base) MCG/ACT inhaler Inhale 2 puffs into the lungs every 6 (six) hours as needed for wheezing or shortness of breath. (Patient not taking: Reported on 10/27/2021) 10/27/2021: prn  ? methotrexate 2.5 MG tablet Take 5 tablets (12.5 mg total) by mouth once a week. Caution:Chemotherapy. Protect from light. (Patient not taking: Reported on 10/27/2021)   ? Spacer/Aero-Holding Chambers (AEROCHAMBER MV) inhaler Use as instructed (Patient not taking: Reported on 10/27/2021)   ? ?No facility-administered encounter medications on file as of 10/27/2021.  ? ?Allergies reviewed ? ? ?ROS: on fever, chills, URI symptoms.  Wrist pain, weakness, tingling at night per HPI.  Wheezing with  walking or laying down; denies any dyspnea currently.  Denies edema. ?See HPI ? ? ?PHYSICAL EXAM: ? ?BP 112/66   Pulse 72   Ht '5\' 4"'$  (1.626 m)   Wt 181 lb (82.1 kg)   LMP 10/09/2015   BMI 31.07 kg/m?  ? ?Well-appearing female, in good spirits. She is holding her R wrist somewhat stiffly. ? ?RUE: Mild STS noted over the proximal dorsum of the hand.  No significant wrist swelling noted.  No warmth. ?+Finklestein test, and also has pain in same area with extension of thumb. ?Pain with extension against resistance  of R 2nd finger (with some giving way, but full initial strength) ?Tender along the dorsum of R 2nd metacarpal and 1st metcarpal. ?Nontender at thenar eminence. ?Normal flexion against resistance with all fingers, no pain. ?Pain with grip testing at thumb  ? ?Tender to palpation at anterior wrist. ?+Tinel, causes shooting pain up to the elbow, nothing into the hand. ? ? ?ASSESSMENT/PLAN: ? ?Right wrist pain - suspect at least a component of CTS. May have extensor tendonitis also. Failed splint trial (not as complaint as hoped); refer for treatment (?injection). - Plan: AMB referral to orthopedics ? ?Pain of right thumb - suspect component of DeQuervain's.  Awaiting records from [facility] (did x-ray). Refer to hand doc for poss injection. Cont splint use - Plan: AMB referral to orthopedics ? ?Relapsing polychondritis - pt on chronic prednisone. No longer f/b rheumatologist.  Will need to get re-established (waiting on pulm tests, then they plan to refer, per pt) ? ?Pt to pick up neb/inhaler as precribed by pulm yesterday, and f/u as planned for studies. ? ? ?R wrist and thumb pain-- ?Exam suggests DeQuervain's tenosynovitis and possible carpal tunnel syndrome. Possible extensor tendonitis.  Unsure if related to polychondritis or not.  Discussed these diagnoses. ?All questions answered. ?Reportedly had normal x-rays recently. ?Unable to wear wrist/thumb spica as often as she should, so hasn't improved.  Encouraged her to try and wear it more regularly. ? ?Refer to Dr. Tempie Donning (hoping to get in sooner than 5/15 appt she made herself with hand center).  To consider injection, if appropriate, vs other treatments. ? ?Requested records from [facility], not yet received. ? ? ?

## 2021-10-27 NOTE — Telephone Encounter (Signed)
Patient came in for visit here today and it was not for insurance reasons when we spoke-not placing referral. ?

## 2021-11-02 ENCOUNTER — Ambulatory Visit: Payer: BC Managed Care – PPO | Admitting: Orthopedic Surgery

## 2021-11-02 ENCOUNTER — Ambulatory Visit (INDEPENDENT_AMBULATORY_CARE_PROVIDER_SITE_OTHER): Payer: BC Managed Care – PPO

## 2021-11-02 ENCOUNTER — Encounter: Payer: Self-pay | Admitting: Orthopedic Surgery

## 2021-11-02 VITALS — BP 109/76 | HR 76 | Ht 64.0 in | Wt 176.0 lb

## 2021-11-02 DIAGNOSIS — M79641 Pain in right hand: Secondary | ICD-10-CM

## 2021-11-02 NOTE — Progress Notes (Signed)
? ?Office Visit Note ?  ?Patient: Michelle Mack           ?Date of Birth: 1965/10/29           ?MRN: [id number] ?Visit Date: 11/02/2021 ?             ?Requested by: [name], MD ?[address] ?[place],  [place] [address] ?PCP: [name], MD ? ? ?Assessment & Plan: ?Visit Diagnoses:  ?1. Pain of right hand   ? ? ?Plan: I reviewed today's x-rays with patient which show well-maintained joint spaces at both the thumb CMC, radiocarpal, midcarpal, and interphalangeal joints.  We discussed treating this conservatively with consistent immobilization with a brace.  She has a brace that is uncomfortable and she does not wear.  We also discussed trying an oral anti-inflammatory to supplement her chronic prednisone.  She does have a history of autoimmune disorder with her polychondritis and a positive antinuclear antibody test.  She is planning on making appointment with rheumatology and I would defer to them to manage her chronic corticosteroid use. ? ?Follow-Up Instructions: No follow-ups on file.  ? ?Orders:  ?Orders Placed This Encounter  ?Procedures  ? XR Hand Complete Right  ? ?No orders of the defined types were placed in this encounter. ? ? ? ? Procedures: ?No procedures performed ? ? ?Clinical Data: ?No additional findings. ? ? ?Subjective: ?Chief Complaint  ?Patient presents with  ? Right Hand - Pain  ?  RIGHT handed,   ? ? ?Is a 56 year old right-hand-dominant female with a history of ANA positive relapsing polychondritis who presents with atraumatic right wrist pain for the last month or two.  She denies any injury or other inciting event one or two months ago.  She denies any cuts, scrapes, or other breaks in the skin.  Pain is diffuse across the dorsal aspect of her wrist into her index and middle fingers.  She has pain at the base of her thumb.  She has pain both at rest but is worse with activity.  She has difficulty with certain tasks such as opening a bottle with things that involve a tight grasp or  twisting motion.  She is on chronic prednisone for her autoimmune polychondritis.  She is seen rheumatology in the past but is not seen 1 recently.  She was given a brace by another orthopedic group which she has not worn as it is uncomfortable and she has difficulty doing her daily tasks in the brace. ? ? ?Review of Systems ? ? ?Objective: ?Vital Signs: BP 109/76 (BP Location: Left Arm, Patient Position: Sitting)   Pulse 76   Ht '5\' 4"'$  (1.626 m)   Wt 176 lb (79.8 kg)   LMP 10/09/2015   BMI 30.21 kg/m?  ? ?Physical Exam ?Constitutional:   ?   Appearance: Normal appearance.  ?Cardiovascular:  ?   Rate and Rhythm: Normal rate.  ?   Pulses: Normal pulses.  ?Pulmonary:  ?   Effort: Pulmonary effort is normal.  ?Skin: ?   General: Skin is warm and dry.  ?   Capillary Refill: Capillary refill takes less than 2 seconds.  ?Neurological:  ?   Mental Status: She is alert.  ? ? ?Right Hand Exam  ? ?Tenderness  ?Right hand tenderness location: Mild to moderate swelling of dorsal wrist and hand.  TTP across dorsum of wrist and at [facility] joint. TTP at radial styloid and ulnar fovea. ? ?Other  ?Erythema: absent ?Sensation: normal ?Pulse: present ? ?  Comments:  Pain w/ CMC grind test w/out crepitus.  No pain w/ ROM of thumb MP joint.  Pain on terminal active extension of wrist but no pain w/ flextion. Pain w/ active radial and ulnar deviation.  ? ? ? ? ?Specialty Comments:  ?No specialty comments available. ? ?Imaging: ?No results found. ? ? ?PMFS History: ?Patient Active Problem List  ? Diagnosis Date Noted  ? Nonproliferative diabetic retinopathy of both eyes without macular edema ([place]) 11/26/2020  ? Atrophic vaginitis 10/31/2020  ? Bronchitis 10/31/2020  ? Colon cancer screening 10/31/2020  ? Constipation 10/31/2020  ? External hemorrhoids 10/31/2020  ? Female stress incontinence 10/31/2020  ? Flatulence, eructation and gas pain 10/31/2020  ? Hemorrhage of anus and rectum 10/31/2020  ? Iron deficiency anemia 10/31/2020  ?  Irritable bowel syndrome 10/31/2020  ? Epigastric pain 10/31/2020  ? Sacral back pain 10/31/2020  ? OA (osteoarthritis) of knee 04/02/2020  ? Unstable angina ([place]) 07/19/2018  ? Chest wall pain 07/18/2018  ? Type 2 diabetes mellitus with hyperglycemia ([place]) 07/18/2018  ? Hypercholesteremia 07/18/2018  ? On prednisone therapy 05/02/2017  ? 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 Fibroid, uterine 11/13/2015  ? Mediastinal adenopathy 10/23/2015  ? Collapse of left lung 10/23/2015  ? Relapsing polychondritis 10/23/2015  ? High risk medication use 07/14/2015  ? Cellulitis of back except buttock 06/13/2015  ? Type 2 diabetes mellitus, controlled, with renal complications ([place]) 26/83/4196  ? CKD (chronic kidney disease) stage 2, GFR 60-89 ml/min 11/06/2014  ? Essential hypertension 05/22/2014  ? Chronic polychondritis 05/02/2014  ? Dysfunctional uterine bleeding 04/29/2014  ? OSA (obstructive sleep apnea) 04/29/2014  ? Other specified respiratory disorders 04/29/2014  ? PND (paroxysmal nocturnal dyspnea) 04/29/2014  ? Microscopic hematuria 04/12/2014  ? 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/26/2014  ? Positive ANA (antinuclear antibody) 02/26/2014  ? Wheezing 09/14/2013  ? Vitamin D deficiency 07/31/2013  ? Obesity (BMI 30-39.9)   ? Impaired fasting glucose 05/24/2012  ? Fibroid uterus 05/24/2012  ? Infertility, female   ? DUB (dysfunctional uterine bleeding)   ? Chronic cough 05/25/2011  ? GERD (gastroesophageal reflux disease) 05/25/2011  ? Anemia 05/25/2011  ? Essential hypertension, benign 04/05/2011  ? ?Past Medical History:  ?Diagnosis Date  ? Anemia   ? on iron  ? Back pain   ? tx with ibuprofen '800mg'$   ? Bronchitis   ? treated recently by abx  ? Cough   ? non-productive  ? DUB (dysfunctional uterine bleeding)   ? Fibroid uterus   ? GYN--Dr. Cletis Media  ? GERD (gastroesophageal reflux disease)   ? Hypertension   ? Infertility, female   ? OA (osteoarthritis) of knee 04/02/2020  ? Mod osteoarthritis, medial compartment of L knee, per x-rays at [facility].  Cortisone injection by Dr. Layne Benton 03/05/20  ? Polychondritis   ? Recurrent upper respiratory infection (URI)   ? in October - tx with abx  ? Type II or unspecified type diabetes mellitus without mention of complication, not stated as uncontrolled   ? diagnosed by Dr. Berdine Addison  ?  ?Family History  ?Problem Relation Age of Onset  ? Hypertension Mother   ? Cancer Mother   ?     pancreatic cancer  ? Diabetes Mother   ? Hypertension Father   ? Diabetes Father   ? Cancer Father   ?     prostate cancer--metastasized 2021  ? Hypertension Sister   ? Thyroid nodules Sister   ? Diabetes Maternal Grandmother   ? Breast cancer Maternal Grandmother   ?  Heart disease Maternal Grandmother   ? Heart disease Paternal Grandmother   ?  ?Past Surgical History:  ?Procedure Laterality Date  ? BRONCHOSCOPY  01/2014  ? at [facility] expiratory collapse of trachea and focal narrowing in L mainstem bronchus and LUL  ? ENDOMETRIAL VAPORIZATION W/ VERSAPOINT    ? LEFT HEART CATH AND CORONARY ANGIOGRAPHY N/A 07/19/2018  ? Procedure: LEFT HEART CATH AND CORONARY ANGIOGRAPHY;  Surgeon: Nigel Mormon, MD;  Location: [facility];  Service: Cardiovascular;  Laterality: N/A;  ? MYOMECTOMY  2002  ? ?Social History  ? ?Occupational History  ? Occupation: Therapist, art at [employer]  ?  Employer: [employer]  ?Tobacco Use  ? Smoking status: Never  ? Smokeless tobacco: Never  ?Vaping Use  ? Vaping Use: Never used  ?Substance and Sexual Activity  ? Alcohol use: Not Currently  ?  Comment: 0-1 drink/week  ? Drug use: No  ? Sexual activity: Not Currently  ?  Birth control/protection: None  ?  Comment: not in the last year  ? ? ? ? ? ? ?

## 2021-11-11 ENCOUNTER — Encounter: Payer: Self-pay | Admitting: *Deleted

## 2021-11-11 NOTE — Patient Instructions (Addendum)
?HEALTH MAINTENANCE RECOMMENDATIONS: ? ?It is recommended that you get at least 30 minutes of aerobic exercise at least 5 days/week (for weight loss, you may need as much as 60-90 minutes). This can be any activity that gets your heart rate up. This can be divided in 10-15 minute intervals if needed, but try and build up your endurance at least once a week.  Weight bearing exercise is also recommended twice weekly. ? ?Eat a healthy diet with lots of vegetables, fruits and fiber.  "Colorful" foods have a lot of vitamins (ie green vegetables, tomatoes, red peppers, etc).  Limit sweet tea, regular sodas and alcoholic beverages, all of which has a lot of calories and sugar.  Up to 1 alcoholic drink daily may be beneficial for women (unless trying to lose weight, watch sugars).  Drink a lot of water. ? ?Calcium recommendations are 1200-1500 mg daily (1500 mg for postmenopausal women or women without ovaries), and vitamin D 1000 IU daily.  This should be obtained from diet and/or supplements (vitamins), and calcium should not be taken all at once, but in divided doses. ? ?Monthly self breast exams and yearly mammograms for women over the age of 60 is recommended. ? ?Sunscreen of at least SPF 30 should be used on all sun-exposed parts of the skin when outside between the hours of 10 am and 4 pm (not just when at beach or pool, but even with exercise, golf, tennis, and yard work!)  Use a sunscreen that says "broad spectrum" so it covers both UVA and UVB rays, and make sure to reapply every 1-2 hours. ? ?Remember to change the batteries in your smoke detectors when changing your clock times in the spring and fall. Carbon monoxide detectors are recommended for your home. ? ?Use your seat belt every time you are in a car, and please drive safely and not be distracted with cell phones and texting while driving. ? ?If your right hand pain isn't continuing to improve, please reach out to Dr. Madelynn Done office regarding  immobilization with a brace (can they provide one, give you referral/recommendations, etc?) ? ?Your sugars were above goal when on the 3 mg of Rybelsus, and higher since you are not taking this any longer.  Since you do not want to take injections (ozempic), you need to get back on the Rybelsus. ?You should contact Dr. Freddie Apley office for a refill--and report your blood sugars, and ask if your dose should be increased to the '7mg'$  tablet (since your sugars were 140's-150's when on the '3mg'$ ). ?The pill will be more effective if you take it with less than 4 ounces of liquid, and 30 minutes prior to your first meal and all other medications. ? ?Please schedule your diabetic eye exam. ?I STRONGLY encourage you to get a pneumonia vaccine.  We discussed this at length today, but you declined.  Feel free to also discuss with Dr. Shearon Stalls.  Due to your diabetes, prednisone and lung/breathing issues, it truly is something that you should get. ? ?I recommend getting the new shingles vaccine (Shingrix). You may want to check with your insurance to verify what your out of pocket cost may be (usually covered as preventative, but better to verify to avoid any surprises, as this vaccine is expensive), and then schedule a nurse visit at our office when convenient (based on the possible side effects as discussed).   ?This is a series of 2 injections, spaced 2 months apart.  It doesn't have to be exactly  2 months apart (but can't be sooner), if that isn't feasible for your schedule, but try and get them close to 2 months (and definitely within 6 months of each other, or else the efficacy of the vaccine drops off). ?This should be separated from other vaccines by at least 2 weeks.  ? ?Yearly flu shots are recommended in the Fall. ? ?

## 2021-11-11 NOTE — Progress Notes (Signed)
?Chief Complaint  ?Patient presents with  ? Annual Exam  ?  Nonfasting annual, no pap. Past due to see Dr. Macarthur Critchley for eye exam (last 08/05/20) she is aware and will call to schedule. Thinks she may have a vaginal bacterial infection. Has odor and off and discharge. She is not sure about pneumo or shingrix-she will think it. Does not want covid booster today. Has question about Ozempic.   ? ?Michelle Mack is a 56 y.o. female who presents for a complete physical.   ?She has the following concerns: ?  ?Recently seen with R hand pain.  Saw Dr. Tempie Donning, was recommended to use a different brace. She was never given a brace. Pain is doing a little better. ? ?Of note, she stopped methotrexate on her own about 2 months ago--she states that since it was started to help her try and get off prednisone, but she wasn't able to get off prednisone, she didn't need it anymore.  ?  ?Hypertension:  She is currently taking 44m amlodipine, and Benicar HCT 20/12.5   ?BP's are not checked elsewhere, just other doctors.  ?She denies chest pain, palpitations, edema, headaches., muscle cramps   ?She notes some more shortness of breath with stairs--she relates this to her weight she has regained. ? ?BP Readings from Last 3 Encounters:  ?11/12/21 100/60  ?11/02/21 109/76  ?10/27/21 112/66  ? ? ?Diabetes:  She is under the care of Dr. HBurney Gauze([facility], Dr. AElyse Hsuretired). She reports compliance with 10016mof Metformin ER BID.  Last seen by endo in 09/2021, at which time A1c was 8.4%. She was started on Rybelsus 27m86mShe didn't have any side effects. She saw commercial for Ozempic, asked to be switched, it was called in, but there was no discussion/counseling. She wasn't aware this was an injection, and prefers oral medications. They didn't have a sooner appointment for further discussion. ?She reports that she had been taking the rybelsus with all of her other medications.  She didn't receive instructions on specifically of how to  take it.  She took it for a month, had no side effects.  Didn't see a huge improvement in her sugars, still 140's-150's fasting. ?She is not currently taking Rybelsus. ? ?She previously was prescribed Farxiga, stopped due to vaginitis and UTI. Also previously prescribed Tradjenta, stopped due to diarrhea.  Previously prescribed Trulicity (admits she never started this injection, didn't want shot). ?She remains on chronic prednisone.   ?She sees podiatrist regularly, denies any foot concerns. ?Last eye exam was 08/2020, showing retinopathy. ? ?Hyperlipidemia:  She reports compliance with atorvastatin, denies side effects. She had cardiac cath in 07/2018 after hospitalized with chest pain.  She had normal coronary arteries.   ?Last lipids done 09/2021, and were at goal: ?LDL Direct <100 mg/dL 51   ?Comment: NCEP-ATP III Guidelines  ? ? LDLc                     Risk Classification                      ? ?  <100 mg/dL                   Optimal  ?  100-129 mg/dL                Desirable  ?  130-159 mg/dL                Borderline  High  ?  160-189 mg/dL                High  ?  >190 mg/dL                   Very High  ?Total Cholesterol <200 MG/DL 132   ?Comment: NCEP III Guidelines  ? ?Total Cholesterol             Risk Classification                      ? ?  <200 mg/dL                      Desirable  ?  200-239 mg/dL                   Borderline High  ?  >240 mg/dL                      High  ?Triglycerides <150 MG/DL 110   ?Comment: NCEP-ATP III Guidelines  ? ? Triglyceride Value           Risk Classification                      ? ?  <150 mg/dL                   Normal  ?  150-199 mg/dL                Borderline High  ?  200-499 mg/dL                High  ?  >=500 mg/dL                  Very High  ?HDL Cholesterol >=60 MG/DL 68   ?Comment: NCEP III Guidelines  ? ? HDLc                     Risk Classification                      ? ?  >=60 mg/dL                  Optimal  ?  >=40 mg/dL                  Desirable  ?  <40  mg/dL                   Low  ?Total Chol / HDL Cholesterol <4.5 1.9   ?Non-HDL Cholesterol MG/DL 64   ?Comment:  TARGET: <(LDL-C TARGET + 30)MG/DL  ? ?  ?Vitamin D deficiency: She is compliant with taking D3 daily. Last level was: 50 in 09/2021 at [facility]. ?  ?Tracheomalacia, relapsing polychondritis, chronic cough and OSA. Recently established with Dr. Shearon Stalls.  PFT's were ordered/scheduled. She does not some slight increased DOE (with stairs).  ?Previously under the care of Dr. Early Osmond, last seen in 12/2020, who was presribing methotrexate and chronic prednisone use. As reported above, she recently stopped the methotrexate on her own. ? ?OSA:  She states  she had a sleep study via Dr. Burt Knack office, and was told she no longer needed to use CPAP.   This was in 2021, no records available.  She mentions that new pulmonary doctor may repeat this. ? ?Patient prefers to keep care  local/[place].  She states she hasn't seen Dr. Trudie Reed in about 6 years. She would prefer to go back and see her rather than to [facility].  She has an appt scheduled with [facility] rheum this summer. ? ?  ?Other doctors caring for patient: ?Endo: Dr. Burney Gauze ?GYN: Dr. Earnstine Regal ?Rheum: Dr. Veneta Penton ([facility], for relapsing polychondritis)--he left, scheduled to see replacement.  Dr. Verlee Rossetti seen in 6 years, would like to go back. ?Pulm:  Dr. Early Osmond ([facility]) -not seen since 12/2020; Dr. Shearon Stalls [facility]) ?Podiatrist: Dr. Cannon Kettle ([place])--recently retired. ?GI: Dr. Collene Mares ?Dentist: Dr. Graciella Freer ?Ophtho: Dr. Nicki Reaper ?Cardiology: Dr. Einar Gip (normal cath 07/2018) ?Nephro: Dr. Posey Pronto at [facility]  ?Ortho-hand: Dr. Tempie Donning ?Plastic surgery: Dr. Claudia Desanctis (had breast reduction and abdominoplasty consult 06/2021)--had mammo, needs to get blood sugars down. ? ?  ?Immunization History  ?Administered Date(s) Administered  ? Hepatitis A 12/27/2007, 01/24/2008, 07/17/2008  ? Hepatitis B 12/27/2007, 01/24/2008, 07/17/2008  ? Hepatitis B, ped/adol 12/27/2007, 01/24/2008, 07/17/2008  ? IPV 12/27/2007  ? MMR  12/27/2007  ? Meningococcal Polysaccharide 12/27/2007  ? PFIZER(Purple Top)SARS-COV-2 Vaccination 09/18/2019, 10/09/2019, 06/17/2020  ? PPD Test 04/14/2011, 06/08/2013, 01/01/2015, 03/08/2017  ? Td 12/28/1995  ? Tdap 12/27/2007, 04/11/2019  ? Typhoid Inactivated 12/27/2007  ? Varicella 12/27/2007  ? Yellow Fever 01/24/2008  ? ?She refuses flu shots ?Last Pap smear: 05/2021, normal, no high risk HPV present ?Last mammogram: 07/2021.  Had L biopsy 09/2021--fibrocystic change with adenosis and calcification ?Last colonoscopy: 12/2016 with Dr. Collene Mares. Internal hemorrhoids, repeat 10 years ?Last DEXA: 07/2019 normal ?Dentist: goes yearly ?Ophtho: 08/2020, due now. ?Exercise: She walks 2 miles daily.  Occasionally goes to the gym.  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 ? ?Vitamin D 50 in 09/2021 ?  ?PMH, PSH, SH and FH were reviewed and updated ?  ?Outpatient Encounter Medications as of 11/12/2021  ?Medication Sig Note  ? amLODipine (NORVASC) 5 MG tablet TAKE 1 TABLET (5 MG TOTAL) BY MOUTH DAILY.   ? Cholecalciferol ([employer] D3) 50 MCG (2000 UT) CAPS TAKE 1 CAPSULE BY MOUTH EVERY DAY   ? metFORMIN (GLUCOPHAGE-XR) 500 MG 24 hr tablet 1,000 mg daily with breakfast. 10/01/2021: Taking 1000 qam and 1000 qpm  ? predniSONE (DELTASONE) 10 MG tablet Take 1 tablet (10 mg total) by mouth daily with breakfast.   ? [DISCONTINUED] atorvastatin (LIPITOR) 20 MG tablet TAKE 1 TABLET BY MOUTH EVERY DAY   ? [DISCONTINUED] olmesartan-hydrochlorothiazide (BENICAR HCT) 20-12.5 MG tablet TAKE 1 TABLET BY MOUTH EVERY DAY   ? albuterol (PROVENTIL) (2.5 MG/3ML) 0.083% nebulizer solution Take 3 mLs (2.5 mg total) by nebulization every 6 (six) hours as needed for wheezing or shortness of breath. (Patient not taking: Reported on 10/27/2021) 10/27/2021: prn  ? albuterol (VENTOLIN HFA) 108 (90 Base) MCG/ACT inhaler Inhale 2 puffs into the lungs every 6 (six) hours as needed for wheezing or shortness of breath. (Patient not taking: Reported on  10/27/2021) 10/27/2021: prn  ? methotrexate 2.5 MG tablet Take 5 tablets (12.5 mg total) by mouth once a week. Caution:Chemotherapy. Protect from light. (Patient not taking: Reported on 10/27/2021) 11/12/2021: Hasn't tak

## 2021-11-12 ENCOUNTER — Encounter: Payer: Self-pay | Admitting: *Deleted

## 2021-11-12 ENCOUNTER — Ambulatory Visit: Payer: BC Managed Care – PPO | Admitting: Family Medicine

## 2021-11-12 ENCOUNTER — Encounter: Payer: Self-pay | Admitting: Family Medicine

## 2021-11-12 ENCOUNTER — Other Ambulatory Visit: Payer: Self-pay | Admitting: Family Medicine

## 2021-11-12 VITALS — BP 100/60 | HR 92 | Ht 64.0 in | Wt 177.0 lb

## 2021-11-12 DIAGNOSIS — Z0000 Encounter for general adult medical examination without abnormal findings: Secondary | ICD-10-CM | POA: Diagnosis not present

## 2021-11-12 DIAGNOSIS — I10 Essential (primary) hypertension: Secondary | ICD-10-CM

## 2021-11-12 DIAGNOSIS — E6609 Other obesity due to excess calories: Secondary | ICD-10-CM

## 2021-11-12 DIAGNOSIS — N182 Chronic kidney disease, stage 2 (mild): Secondary | ICD-10-CM

## 2021-11-12 DIAGNOSIS — E7800 Pure hypercholesterolemia, unspecified: Secondary | ICD-10-CM

## 2021-11-12 DIAGNOSIS — G4733 Obstructive sleep apnea (adult) (pediatric): Secondary | ICD-10-CM

## 2021-11-12 DIAGNOSIS — R140 Abdominal distension (gaseous): Secondary | ICD-10-CM | POA: Diagnosis not present

## 2021-11-12 DIAGNOSIS — Z7185 Encounter for immunization safety counseling: Secondary | ICD-10-CM

## 2021-11-12 DIAGNOSIS — Z6830 Body mass index (BMI) 30.0-30.9, adult: Secondary | ICD-10-CM

## 2021-11-12 DIAGNOSIS — M941 Relapsing polychondritis: Secondary | ICD-10-CM | POA: Diagnosis not present

## 2021-11-12 DIAGNOSIS — E669 Obesity, unspecified: Secondary | ICD-10-CM | POA: Diagnosis not present

## 2021-11-12 DIAGNOSIS — M25531 Pain in right wrist: Secondary | ICD-10-CM

## 2021-11-12 DIAGNOSIS — E1122 Type 2 diabetes mellitus with diabetic chronic kidney disease: Secondary | ICD-10-CM | POA: Diagnosis not present

## 2021-11-12 DIAGNOSIS — K5904 Chronic idiopathic constipation: Secondary | ICD-10-CM | POA: Diagnosis not present

## 2021-11-12 DIAGNOSIS — J398 Other specified diseases of upper respiratory tract: Secondary | ICD-10-CM

## 2021-11-12 LAB — POCT URINALYSIS DIP (PROADVANTAGE DEVICE)
Bilirubin, UA: NEGATIVE
Glucose, UA: NEGATIVE mg/dL
Ketones, POC UA: NEGATIVE mg/dL
Leukocytes, UA: NEGATIVE
Nitrite, UA: NEGATIVE
Protein Ur, POC: NEGATIVE mg/dL
Specific Gravity, Urine: 1.02
Urobilinogen, Ur: NEGATIVE
pH, UA: 6 (ref 5.0–8.0)

## 2021-11-13 ENCOUNTER — Other Ambulatory Visit: Payer: Self-pay | Admitting: Family Medicine

## 2021-11-13 DIAGNOSIS — E7800 Pure hypercholesterolemia, unspecified: Secondary | ICD-10-CM

## 2021-11-17 ENCOUNTER — Other Ambulatory Visit: Payer: Self-pay | Admitting: Internal Medicine

## 2021-11-17 DIAGNOSIS — M941 Relapsing polychondritis: Secondary | ICD-10-CM

## 2021-11-18 ENCOUNTER — Other Ambulatory Visit: Payer: BC Managed Care – PPO

## 2021-11-18 ENCOUNTER — Encounter: Payer: Self-pay | Admitting: *Deleted

## 2021-11-25 ENCOUNTER — Encounter: Payer: Self-pay | Admitting: Obstetrics and Gynecology

## 2021-11-25 ENCOUNTER — Ambulatory Visit: Payer: BC Managed Care – PPO | Admitting: Internal Medicine

## 2021-11-25 ENCOUNTER — Ambulatory Visit: Payer: BC Managed Care – PPO | Admitting: Obstetrics and Gynecology

## 2021-11-25 ENCOUNTER — Encounter: Payer: Self-pay | Admitting: Family Medicine

## 2021-11-25 ENCOUNTER — Ambulatory Visit: Payer: BC Managed Care – PPO | Admitting: Family Medicine

## 2021-11-25 VITALS — BP 101/63 | HR 73 | Ht 61.0 in | Wt 176.0 lb

## 2021-11-25 VITALS — BP 110/60 | HR 80 | Temp 98.3°F | Ht 61.0 in | Wt 175.0 lb

## 2021-11-25 DIAGNOSIS — J029 Acute pharyngitis, unspecified: Secondary | ICD-10-CM

## 2021-11-25 DIAGNOSIS — J302 Other seasonal allergic rhinitis: Secondary | ICD-10-CM | POA: Diagnosis not present

## 2021-11-25 DIAGNOSIS — R350 Frequency of micturition: Secondary | ICD-10-CM | POA: Diagnosis not present

## 2021-11-25 DIAGNOSIS — N393 Stress incontinence (female) (male): Secondary | ICD-10-CM

## 2021-11-25 LAB — POCT URINALYSIS DIPSTICK
Bilirubin, UA: NEGATIVE
Glucose, UA: NEGATIVE
Ketones, UA: NEGATIVE
Leukocytes, UA: NEGATIVE
Nitrite, UA: NEGATIVE
Protein, UA: NEGATIVE
Spec Grav, UA: >=1.03 — AB (ref 1.010–1.025)
Urobilinogen, UA: 0.2 E.U./dL
pH, UA: 5.5 (ref 5.0–8.0)

## 2021-11-25 LAB — POCT RAPID STREP A (OFFICE): Rapid Strep A Screen: NEGATIVE

## 2021-11-25 NOTE — Progress Notes (Signed)
Chief Complaint  Patient presents with   Sore Throat    ST that started Monday, no other symptoms. Grandchild positive for strep last Wed. Has been gargling with salt water.    2 days ago she started with sore throat. It hasn't gotten any worse, but persists.  Seems to be only on the left side. She does have some sniffling, sneezing, and PND.  She is using Flonase, which seems to be helping. Granddaughter was diagnosed last week with strep throat, lives with her.  PMH, PSH, SH reviewed  Outpatient Encounter Medications as of 11/25/2021  Medication Sig Note   amLODipine (NORVASC) 5 MG tablet TAKE 1 TABLET (5 MG TOTAL) BY MOUTH DAILY.    atorvastatin (LIPITOR) 20 MG tablet TAKE 1 TABLET BY MOUTH EVERY DAY    Cholecalciferol (CVS D3) 50 MCG (2000 UT) CAPS TAKE 1 CAPSULE BY MOUTH EVERY DAY    metFORMIN (GLUCOPHAGE-XR) 500 MG 24 hr tablet 1,000 mg daily with breakfast. 10/01/2021: Taking 1000 qam and 1000 qpm   methotrexate (RHEUMATREX) 2.5 MG tablet TAKE 5 TABLETS (12.5 MG TOTAL) BY MOUTH ONCE A WEEK. CAUTION:CHEMOTHERAPY. PROTECT FROM LIGHT.    olmesartan-hydrochlorothiazide (BENICAR HCT) 20-12.5 MG tablet TAKE 1 TABLET BY MOUTH EVERY DAY    OZEMPIC, 0.25 OR 0.5 MG/DOSE, 2 MG/3ML SOPN  11/25/2021: .'25mg'$    predniSONE (DELTASONE) 10 MG tablet Take 1 tablet (10 mg total) by mouth daily with breakfast.    albuterol (PROVENTIL) (2.5 MG/3ML) 0.083% nebulizer solution Take 3 mLs (2.5 mg total) by nebulization every 6 (six) hours as needed for wheezing or shortness of breath. (Patient not taking: Reported on 11/25/2021) 10/27/2021: prn   albuterol (VENTOLIN HFA) 108 (90 Base) MCG/ACT inhaler Inhale 2 puffs into the lungs every 6 (six) hours as needed for wheezing or shortness of breath. (Patient not taking: Reported on 11/25/2021) 10/27/2021: prn   Spacer/Aero-Holding Josiah Lobo (AEROCHAMBER MV) inhaler Use as instructed    No facility-administered encounter medications on file as of 11/25/2021.   Allergies  reviewed  ROS:  no fever, chills, swollen glands, n/v/d, rash.  +sore throat and allergy symptoms per HPI. No ear pain or sinus pain. See HPI    PHYSICAL EXAM:  BP 110/60   Pulse 80   Temp 98.3 F (36.8 C) (Tympanic)   Ht '5\' 1"'$  (1.549 m)   Wt 175 lb (79.4 kg)   LMP 10/09/2015   BMI 33.07 kg/m   Wt Readings from Last 3 Encounters:  11/25/21 175 lb (79.4 kg)  11/25/21 176 lb (79.8 kg)  11/12/21 177 lb (80.3 kg)   Pleasant, well-appearing female in no distress Frequent throat-clearing noted during the visit. HEENT: conjunctiva and sclera are clear, EOMI. Nasal mucosa is moderately edematous with clear mucus and crusting noted on the left. Sinuses nontender. TMs and EAC's normal. OP: some erythema and cobblestoning bilaterally.  No enlargement or erythema of the tonsils Neck: no lymphadenopathy Heart: regular rate and rhythm Lungs: clear bilaterally Psych: normal mood, affect, hygiene and grooming Neuro: alert and oriented, cranial nerves grossly intact.  Normal gait  Rapid strep negative   ASSESSMENT/PLAN:  Sore throat - negative strep test, normal tonsils.  Suspect related to PND and allergies - Plan: Rapid Strep A  Seasonal allergies - cont flonase, antihistamine also recommended.  s/sx of infection reviewed, to f/u if sx persist/worsen

## 2021-11-25 NOTE — Patient Instructions (Signed)
There is no evidence of strep throat on your exam, and the test was negative.  I suspect that allergies and postnasal drainage is the cause of your sore throat. Continue the antihistamines and Flonase.  Contact us if you develop fever, worsening sore throat or swollen glands.  Send [place] a picture of your throat as well, so that we can determine if you need an antibiotic.

## 2021-11-25 NOTE — Progress Notes (Signed)
[facility] New Patient Evaluation and Consultation  Referring Provider: Delsa Bern, MD PCP: [name], MD Date of Service: 11/25/2021  SUBJECTIVE Chief Complaint: New Patient (Initial Visit) Michelle Mack is a 56 y.o. female here for consult on prolapse./)  History of Present Illness: Michelle Mack is a 56 y.o. Black or African-American female seen in consultation at the request of Dr. Cletis Media for evaluation of incontinence.    Review of records from Dr Cletis Media significant for: Has incontinence. Leakage can happen without movement. Has been worsening.   Urinary Symptoms: Leaks urine with cough/ sneeze, laughing, exercise, lifting, and with movement to the bathroom. SUI > UUI. Not as much urgency.  Leaks a few times per day Pad use: 5 liners/ mini-pads per day.   Michelle Mack is bothered by her UI symptoms.  Day time voids 5.  Nocturia: 3 times per night to void. Voiding dysfunction: Michelle Mack empties her bladder well.  does not use a catheter to empty bladder.  When urinating, Michelle Mack feels a weak stream and the need to urinate multiple times in a row  UTIs:  0  UTI's in the last year.   Reports history of blood in urine- had workup with urology  Pelvic Organ Prolapse Symptoms:                  Michelle Mack Denies a feeling of a bulge the vaginal area.   Bowel Symptom: Bowel movements: 4 time(s) per week Stool consistency: hard Straining: yes.  Splinting: yes.  Incomplete evacuation: no.  Michelle Mack Denies accidental bowel leakage / fecal incontinence Bowel regimen: just started a new medication prescribed   Sexual Function Sexually active: no.   Pelvic Pain Denies pelvic pain    Past Medical History:  Past Medical History:  Diagnosis Date   Anemia    on iron   Back pain    tx with ibuprofen '800mg'$    Bronchitis    treated recently by abx   Cough    non-productive   DUB (dysfunctional uterine bleeding)    Fibroid uterus    GYN--Dr. Rivard   GERD (gastroesophageal  reflux disease)    Hypertension    Infertility, female    OA (osteoarthritis) of knee 04/02/2020   Mod osteoarthritis, medial compartment of L knee, per x-rays at [facility].  Cortisone injection by Dr. Layne Benton 03/05/20   Polychondritis    Recurrent upper respiratory infection (URI)    in October - tx with abx   Type II or unspecified type diabetes mellitus without mention of complication, not stated as uncontrolled    diagnosed by Dr. Berdine Addison     Past Surgical History:   Past Surgical History:  Procedure Laterality Date   BRONCHOSCOPY  01/02/2014   at [facility] expiratory collapse of trachea and focal narrowing in L mainstem bronchus and LUL   ENDOMETRIAL VAPORIZATION W/ VERSAPOINT     LEFT HEART CATH AND CORONARY ANGIOGRAPHY N/A 07/19/2018   Procedure: LEFT HEART CATH AND CORONARY ANGIOGRAPHY;  Surgeon: Nigel Mormon, MD;  Location: [facility];  Service: Cardiovascular;  Laterality: N/A;   MYOMECTOMY  07/05/2000   SUPRACERVICAL ABDOMINAL HYSTERECTOMY     still has ovaries     Past OB/GYN History: OB History  Gravida Para Term Preterm AB Living  '3 1 1   2 1  '$ SAB IAB Ectopic Multiple Live Births  1 0     1    # Outcome Date GA Lbr Len/2nd Weight Sex Delivery  Anes PTL Lv  3 Term      Vag-Spont     2 AB           1 SAB            S/p supracervical hysterectomy   Medications: Michelle Mack has a current medication list which includes the following prescription(s): albuterol, albuterol, amlodipine, atorvastatin, cvs d3, metformin, methotrexate, olmesartan-hydrochlorothiazide, ozempic (0.25 or 0.5 mg/dose), prednisone, and aerochamber mv.   Allergies: Patient is allergic to ampicillin, penicillins, cephalosporins, elemental sulfur, nitrofuran derivatives, sulfamethoxazole-trimethoprim, and mobic [meloxicam].   Social History:  Social History   Tobacco Use   Smoking status: Never   Smokeless tobacco: Never  Vaping Use   Vaping Use: Never used  Substance Use  Topics   Alcohol use: Yes    Comment: 0-1 drink/week   Drug use: No    Relationship status: married Michelle Mack lives with family.   Michelle Mack is employed. Regular exercise: Yes: walk History of abuse: No  Family History:   Family History  Problem Relation Age of Onset   Hypertension Mother    Diabetes Mother    Hypertension Father    Diabetes Father    Cancer Father        prostate cancer--metastasized 2021   Hypertension Sister    Thyroid nodules Sister    Diabetes Maternal Grandmother    Breast cancer Maternal Grandmother    Heart disease Maternal Grandmother    Heart disease Paternal Grandmother      Review of Systems: Review of Systems  Constitutional:  Positive for malaise/fatigue. Negative for fever and weight loss.  Respiratory:  Negative for cough, shortness of breath and wheezing.   Cardiovascular:  Negative for chest pain, palpitations and leg swelling.  Gastrointestinal:  Positive for blood in stool. Negative for abdominal pain.  Genitourinary:  Negative for dysuria.  Musculoskeletal:  Positive for myalgias.  Skin:  Negative for rash.  Neurological:  Negative for dizziness and headaches.  Endo/Heme/Allergies:  Does not bruise/bleed easily.       + hot flashes  Psychiatric/Behavioral:  Negative for depression. The patient is not nervous/anxious.     OBJECTIVE Physical Exam: Vitals:   11/25/21 0941  BP: 101/63  Pulse: 73  Weight: 176 lb (79.8 kg)  Height: '5\' 1"'$  (1.549 m)    Physical Exam Constitutional:      General: Michelle Mack is not in acute distress. Pulmonary:     Effort: Pulmonary effort is normal.  Abdominal:     General: There is no distension.     Palpations: Abdomen is soft.     Tenderness: There is no abdominal tenderness. There is no rebound.  Musculoskeletal:        General: No swelling. Normal range of motion.  Skin:    General: Skin is warm and dry.     Findings: No rash.  Neurological:     Mental Status: Michelle Mack is alert and oriented to person,  place, and time.  Psychiatric:        Mood and Affect: Mood normal.        Behavior: Behavior normal.     GU / Detailed Urogynecologic Evaluation:  Pelvic Exam: Normal external female genitalia; Bartholin's and Skene's glands normal in appearance; urethral meatus normal in appearance, no urethral masses or discharge.   CST: positive  Speculum exam reveals normal vaginal mucosa with atrophy. Cervix normal appearance. Uterus absent. Adnexa no mass, fullness, tenderness.    Pelvic floor strength III/V  Pelvic floor musculature: Right levator  non-tender, Right obturator non-tender, Left levator non-tender, Left obturator non-tender  POP-Q:   POP-Q  -3                                            Aa   -3                                           Ba  -6.5                                              C   2.5                                            Gh  4                                            Pb  8                                            tvl   -2                                            Ap  -2                                            Bp  -7                                              D     Rectal Exam:  Normal external rectum  Post-Void Residual (PVR) by Bladder Scan: In order to evaluate bladder emptying, we discussed obtaining a postvoid residual and Michelle Mack agreed to this procedure.  Procedure: The ultrasound unit was placed on the patient's abdomen in the suprapubic region after the patient had voided. A PVR of 6 ml was obtained by bladder scan.  Laboratory Results: POC urine: negative   ASSESSMENT AND PLAN Michelle Mack is a 56 y.o. with:  1. SUI (stress urinary incontinence, female)   2. Urinary frequency     - For treatment of stress urinary incontinence,  non-surgical options include expectant management, weight loss, physical therapy, as well as a pessary.  Surgical options include a midurethral sling,  and transurethral injection of a bulking  agent. - Michelle Mack is potentially interested in a procedure but her last Hgb A1c on 09/08/21 was 8.4%. We reviewed that her blood sugars would need to be better  controlled, with the A1c being less than 8 in order to proceed with surgery.  - In the meantime, Michelle Mack would like to proceed with a pessary. Michelle Mack will return for a fitting.   Jaquita Folds, MD   Medical Decision Making:  - Reviewed/ ordered a clinical laboratory test - Review and summation of prior records

## 2021-11-25 NOTE — Patient Instructions (Signed)
For treatment of stress urinary incontinence, which is leakage with physical activity/movement/strainging/coughing, we discussed expectant management versus nonsurgical options versus surgery. Nonsurgical options include weight loss, physical therapy, as well as a pessary.  Surgical options include a midurethral sling, which is a synthetic mesh sling that acts like a hammock under the urethra to prevent leakage of urine, and transurethral injection of a bulking agent.  

## 2021-12-04 ENCOUNTER — Ambulatory Visit (INDEPENDENT_AMBULATORY_CARE_PROVIDER_SITE_OTHER): Payer: BC Managed Care – PPO | Admitting: Internal Medicine

## 2021-12-04 ENCOUNTER — Encounter: Payer: Self-pay | Admitting: Internal Medicine

## 2021-12-04 VITALS — BP 110/72 | HR 77 | Ht 64.0 in | Wt 173.4 lb

## 2021-12-04 DIAGNOSIS — J398 Other specified diseases of upper respiratory tract: Secondary | ICD-10-CM | POA: Diagnosis not present

## 2021-12-04 DIAGNOSIS — M941 Relapsing polychondritis: Secondary | ICD-10-CM

## 2021-12-04 DIAGNOSIS — G4733 Obstructive sleep apnea (adult) (pediatric): Secondary | ICD-10-CM

## 2021-12-04 DIAGNOSIS — R0602 Shortness of breath: Secondary | ICD-10-CM | POA: Diagnosis not present

## 2021-12-04 LAB — PULMONARY FUNCTION TEST
DL/VA % pred: 107 %
DL/VA: 4.61 ml/min/mmHg/L
DLCO cor % pred: 85 %
DLCO cor: 17.86 ml/min/mmHg
DLCO unc % pred: 85 %
DLCO unc: 17.86 ml/min/mmHg
FEF 25-75 Post: 0.94 L/sec
FEF 25-75 Pre: 0.78 L/sec
FEF2575-%Change-Post: 19 %
FEF2575-%Pred-Post: 40 %
FEF2575-%Pred-Pre: 33 %
FEV1-%Change-Post: 0 %
FEV1-%Pred-Post: 64 %
FEV1-%Pred-Pre: 64 %
FEV1-Post: 1.46 L
FEV1-Pre: 1.45 L
FEV1FVC-%Change-Post: 4 %
FEV1FVC-%Pred-Pre: 66 %
FEV6-%Change-Post: -2 %
FEV6-%Pred-Post: 93 %
FEV6-%Pred-Pre: 95 %
FEV6-Post: 2.56 L
FEV6-Pre: 2.63 L
FEV6FVC-%Change-Post: 1 %
FEV6FVC-%Pred-Post: 101 %
FEV6FVC-%Pred-Pre: 99 %
FVC-%Change-Post: -3 %
FVC-%Pred-Post: 92 %
FVC-%Pred-Pre: 95 %
FVC-Post: 2.61 L
FVC-Pre: 2.71 L
Post FEV1/FVC ratio: 56 %
Post FEV6/FVC ratio: 98 %
Pre FEV1/FVC ratio: 54 %
Pre FEV6/FVC Ratio: 97 %
RV % pred: 105 %
RV: 1.94 L
TLC % pred: 92 %
TLC: 4.69 L

## 2021-12-04 MED ORDER — FOLIC ACID 1 MG PO TABS
1.0000 mg | ORAL_TABLET | Freq: Every day | ORAL | 5 refills | Status: DC
Start: 2021-12-04 — End: 2022-04-26

## 2021-12-04 MED ORDER — METHOTREXATE 2.5 MG PO TABS: 12.5000 mg | ORAL_TABLET | ORAL | 5 refills | Status: DC

## 2021-12-04 MED ORDER — PREDNISONE 5 MG PO TABS: 10.0000 mg | ORAL_TABLET | Freq: Every day | ORAL | 5 refills | Status: DC

## 2021-12-04 NOTE — Progress Notes (Signed)
Michelle Mack    [id number]    06-04-1966  [facility], Tera Helper, MD Date of Appointment: 12/04/2021 Established Patient Visit  Chief complaint:   Chief Complaint  Patient presents with   Follow-up    She feels that she is doing some better since last OV. She feels albuterol can make her shortness of breath worse.     HPI: Michelle Mack is a 56 y.o. woman with relapsing polychondritis with pulmonary involvement.   Interval Updates: Here for follow up after PFTS which show moderate airflow limitation. Stopped albuterol because it wasn't helpful.   On methotrexate 2.5 mg weekly and prednisone 10 mg daily.   She does have a cpap but does not wear it and hasn't worn it for many years.  Notes when she tried to come of prednisone previously, she had aches and pains that prohibited her from coming off it completely.   No cough, fevers, chills, shortness of breath.    I have reviewed the patient's family social and past medical history and updated as appropriate.   Past Medical History:  Diagnosis Date   Anemia    on iron   Back pain    tx with ibuprofen 821m   Bronchitis    treated recently by abx   Cough    non-productive   DUB (dysfunctional uterine bleeding)    Fibroid uterus    GYN--Dr. Rivard   GERD (gastroesophageal reflux disease)    Hypertension    Infertility, female    OA (osteoarthritis) of knee 04/02/2020   Mod oste[facility]  Cortisone injection by Dr. BLayne Benton9/1/21   Polychondritis    Recurrent upper respiratory infection (URI)    in October - tx with abx   Type II or unspecified type diabetes mellitus without mention of complication, not stated as uncontrolled    diagnosed by Dr. HBerdine Addison   Past Surgical History:  Procedure Laterality Date   [facility] bronchus and  LUL   ENDOMETRIAL VAPORIZATION W/ VERSAPOINT     LEFT HEART CATH AND CORONARY ANGIOGRAPHY N/A 07/19/2018   Procedure: LEFT HEART CATH AND CORONARY ANGIOGRAPHY;  Surgeon: PNigel Mormon MD;  [facility];  Service: Cardiovascular;  Laterality: N/A;   MYOMECTOMY  07/05/2000   SUPRACERVICAL ABDOMINAL HYSTERECTOMY     still has ovaries    Family History  Problem Relation Age of Onset   Hypertension Mother    Diabetes Mother    Hypertension Father    Diabetes Father    Cancer Father        prostate cancer--metastasized 2021   Hypertension Sister    Thyroid nodules Sister    Diabetes Maternal Grandmother    Breast cancer Maternal Grandmother    Heart disease Maternal Grandmother    Heart disease Paternal Grandmother     Social History   Occupational History   Occupation: CTherapist, artat [employer]  Tobacco Use   Smoking status: Never   Smokeless tobacco: Never  Vaping Use   Vaping Use: Never used  Substance and Sexual Activity   Alcohol use: Yes    Comment: 0-1 drink/week   Drug use: No   Sexual activity: Not Currently    Birth control/protection: None     Physical Exam: Blood  pressure 110/72, pulse 77, height _0  (1.626 m), weight 173 lb 6.4 oz (78.7 kg), last menstrual period 10/09/2015, SpO2 97 %.  Gen:      No acute distress ENT:  no nasal polyps, mucus membranes moist Lungs:    No increased respiratory effort, symmetric chest wall excursion, clear to auscultation bilaterally, no wheezes or crackles CV:         Regular rate and rhythm; no murmurs, rubs, or gallops.  No pedal edema   Data Reviewed: Imaging: I have personally reviewed the chest xray Nov 2022 - no acute process Previously reviewed CT Chest reports in care everywhere document tracheomalacia.   PFTs:     Latest Ref Rng & Units 12/04/2021   12:05 PM  PFT Results  FVC-Pre L 2.71  P  FVC-Predicted Pre % 95  P  FVC-Post L 2.61  P  FVC-Predicted  Post % 92  P  Pre FEV1/FVC % % 54  P  Post FEV1/FCV % % 56  P  FEV1-Pre L 1.45  P  FEV1-Predicted Pre % 64  P  FEV1-Post L 1.46  P  DLCO uncorrected ml/min/mmHg 17.86  P  DLCO UNC% % 85  P  DLCO corrected ml/min/mmHg 17.86  P  DLCO COR %Predicted % 85  P  DLVA Predicted % 107  P  TLC L 4.69  P  TLC % Predicted % 92  P  RV % Predicted % 105  P    P Preliminary result   I have personally reviewed the patient's PFTs today which shows moderate airflow limitation FEV1 64% of predicted. No bronchodilator response, normal lung volumes and diffusion capacity.  I have personally reviewed the patient's PFTs and in Jan 2021 showed moderate airflow limitation FEV1 68% of predicted.    Labs: Lab Results  Component Value Date   WBC 14.4 (H) 01/18/2020   HGB 12.5 01/18/2020   HCT 39.6 01/18/2020   MCV 88.8 01/18/2020   PLT 312 01/18/2020   Lab Results  Component Value Date   NA 142 09/03/2020   K 3.7 09/03/2020   CL 102 09/03/2020   CO2 24 09/03/2020     Immunization status: Immunization History  Administered Date(s) Administered   Hepatitis A 12/27/2007, 01/24/2008, 07/17/2008   Hepatitis B 12/27/2007, 01/24/2008, 07/17/2008   Hepatitis B, ped/adol 12/27/2007, 01/24/2008, 07/17/2008   IPV 12/27/2007   MMR 12/27/2007   Meningococcal Polysaccharide 12/27/2007   PFIZER(Purple Top)SARS-COV-2 Vaccination 09/18/2019, 10/09/2019, 06/17/2020   PPD Test 04/14/2011, 06/08/2013, 01/01/2015, 03/08/2017   Td 12/28/1995   Tdap 12/27/2007, 04/11/2019   Typhoid Inactivated 12/27/2007   Varicella 12/27/2007   Yellow Fever 01/24/2008    External Records Personally Reviewed: PCP  Assessment:  Relapsing Polychondritis Tracheomalacia OSA not on CPAP  Plan/Recommendations: Uptitrate methotrexate to 12.5 mg weekly. Resume folic acid.  Will refer to rheumatology for co-management. Will obtain split night study to resume Pap therapy.  Come down to 5 mg prednisone daily once at goal for  mtx.    Return to Care: Return in about 3 months (around 03/06/2022).   Lenice Llamas, MD Pulmonary and [place]

## 2021-12-04 NOTE — Patient Instructions (Signed)
Full PFT performed today. °

## 2021-12-04 NOTE — Patient Instructions (Addendum)
Please schedule follow up scheduled with myself in 3 months.  If my schedule is not open yet, we will contact you with a reminder closer to that time. Please call [phone] if you haven't heard from [place] a month before.   Follow up with rheumatology - they will call you to schedule. Let me know if they don't call you in the next week or two.   Start taking one additional pill for methotrexate each week until you get to 5 pills a week (total dose 12.5 mg weekly.) Start taking methotrexate again.   This should take a total of one month.   Once you get to 5 pills/week, go down to '5mg'$  on your prednisone until you see me again.   You can stop albuterol.   We ordered a sleep study - they will call you to schedule.

## 2021-12-04 NOTE — Progress Notes (Signed)
Full PFT performed today. °

## 2021-12-06 ENCOUNTER — Other Ambulatory Visit: Payer: Self-pay

## 2021-12-06 ENCOUNTER — Emergency Department (HOSPITAL_BASED_OUTPATIENT_CLINIC_OR_DEPARTMENT_OTHER): Payer: BC Managed Care – PPO | Admitting: Radiology

## 2021-12-06 ENCOUNTER — Emergency Department (HOSPITAL_BASED_OUTPATIENT_CLINIC_OR_DEPARTMENT_OTHER)
Admission: EM | Admit: 2021-12-06 | Discharge: 2021-12-06 | Disposition: A | Discharge status: Home / Self Care | Payer: BC Managed Care – PPO | Attending: Emergency Medicine | Admitting: Emergency Medicine

## 2021-12-06 DIAGNOSIS — R059 Cough, unspecified: Secondary | ICD-10-CM | POA: Diagnosis not present

## 2021-12-06 DIAGNOSIS — L299 Pruritus, unspecified: Secondary | ICD-10-CM | POA: Insufficient documentation

## 2021-12-06 DIAGNOSIS — Z7984 Long term (current) use of oral hypoglycemic drugs: Secondary | ICD-10-CM | POA: Diagnosis not present

## 2021-12-06 DIAGNOSIS — R0602 Shortness of breath: Secondary | ICD-10-CM | POA: Diagnosis not present

## 2021-12-06 DIAGNOSIS — R072 Precordial pain: Secondary | ICD-10-CM | POA: Insufficient documentation

## 2021-12-06 DIAGNOSIS — R079 Chest pain, unspecified: Secondary | ICD-10-CM

## 2021-12-06 DIAGNOSIS — Z79899 Other long term (current) drug therapy: Secondary | ICD-10-CM | POA: Diagnosis not present

## 2021-12-06 DIAGNOSIS — R0789 Other chest pain: Secondary | ICD-10-CM | POA: Diagnosis not present

## 2021-12-06 LAB — TROPONIN I (HIGH SENSITIVITY)
Troponin I (High Sensitivity): 8 ng/L (ref <18)
Troponin I (High Sensitivity): 8 ng/L (ref <18)

## 2021-12-06 LAB — CBC WITH DIFFERENTIAL/PLATELET
Abs Immature Granulocytes: 0.05 10*3/uL (ref 0.00–0.07)
Basophils Absolute: 0 10*3/uL (ref 0.0–0.1)
Basophils Relative: 0 %
Eosinophils Absolute: 0 10*3/uL (ref 0.0–0.5)
Eosinophils Relative: 0 %
HCT: 36.7 % (ref 36.0–46.0)
Hemoglobin: 11.7 g/dL — ABNORMAL LOW (ref 12.0–15.0)
Immature Granulocytes: 1 %
Lymphocytes Relative: 15 %
Lymphs Abs: 1.5 10*3/uL (ref 0.7–4.0)
MCH: 28.5 pg (ref 26.0–34.0)
MCHC: 31.9 g/dL (ref 30.0–36.0)
MCV: 89.5 fL (ref 80.0–100.0)
Monocytes Absolute: 0.7 10*3/uL (ref 0.1–1.0)
Monocytes Relative: 7 %
Neutro Abs: 7.9 10*3/uL — ABNORMAL HIGH (ref 1.7–7.7)
Neutrophils Relative %: 77 %
Platelets: 328 10*3/uL (ref 150–400)
RBC: 4.1 MIL/uL (ref 3.87–5.11)
RDW: 12.8 % (ref 11.5–15.5)
WBC: 10.2 10*3/uL (ref 4.0–10.5)
nRBC: 0 % (ref 0.0–0.2)

## 2021-12-06 LAB — BASIC METABOLIC PANEL
Anion gap: 11 (ref 5–15)
BUN: 19 mg/dL (ref 6–20)
CO2: 26 mmol/L (ref 22–32)
Calcium: 9.6 mg/dL (ref 8.9–10.3)
Chloride: 104 mmol/L (ref 98–111)
Creatinine, Ser: 1.06 mg/dL — ABNORMAL HIGH (ref 0.44–1.00)
GFR, Estimated: >60 mL/min (ref >60)
Glucose, Bld: 128 mg/dL — ABNORMAL HIGH (ref 70–99)
Potassium: 3.8 mmol/L (ref 3.5–5.1)
Sodium: 141 mmol/L (ref 135–145)

## 2021-12-06 NOTE — ED Provider Notes (Signed)
Care was taken over from Dr. Karle Starch.  Patient presents with some midsternal chest pain.  No exertional symptoms.  No ischemic changes on EKG.  She has had 2 negative troponins.  No ongoing symptoms.  She does not have an elevated heart score.  Is appropriate for discharge and close follow-up with her PCP.   Malvin Johns, MD 12/06/21 [phone]

## 2021-12-06 NOTE — Discharge Instructions (Signed)
Follow-up with your primary care doctor for recheck.  Return to the emergency room if you have any worsening symptoms.

## 2021-12-06 NOTE — ED Notes (Signed)
Dc instructions reviewed with patient. Patient voiced understanding. Dc with belongings.  °

## 2021-12-06 NOTE — ED Provider Notes (Signed)
[facility] EMERGENCY DEPT  Provider Note  CSN: [id number] Arrival date & time: 12/06/21 0258  History Chief Complaint  Patient presents with   Chest Pain    Michelle Mack is a 56 y.o. female with history of polychondritis with lung involvement report she had some facial swelling/itching yesterday, took some benadryl with improvement.She woke up during the night with mid sternal chest pressure, different from prior symptoms. Some cough and SOB recently she attributes to PFTs with albuterol she had done 2 days ago. No fever. No known CAD. Currently on methotrexate (2.'5mg'$  daily) and prednisone ('10mg'$  daily) without recent change although pulmonology has indicated they will plan to increase MTX pending PFT results.   Home Medications Prior to Admission medications   Medication Sig Start Date End Date Taking? Authorizing Provider  amLODipine (NORVASC) 5 MG tablet TAKE 1 TABLET (5 MG TOTAL) BY MOUTH DAILY. 08/06/21   [name], MD  atorvastatin (LIPITOR) 20 MG tablet TAKE 1 TABLET BY MOUTH EVERY DAY 11/13/21   [name], MD  Cholecalciferol ([employer]S D3) 50 MCG (2000 UT) CAPS TAKE 1 CAPSULE BY MOUTH EVERY DAY 12/11/20   [name], MD  folic acid (FOLVITE) 1 MG tablet Take 1 tablet (1 mg total) by mouth daily. 12/04/21   Spero Geralds, MD  metFORMIN (GLUCOPHAGE-XR) 500 MG 24 hr tablet 1,000 mg daily with breakfast. 10/18/19   [provider]  methotrexate (RHEUMATREX) 2.5 MG tablet Take 5 tablets (12.5 mg total) by mouth once a week. Caution:Chemotherapy. Protect from light. 12/04/21   Spero Geralds, MD  olmesartan-hydrochlorothiazide (BENICAR HCT) 20-12.5 MG tablet TAKE 1 TABLET BY MOUTH EVERY DAY 11/13/21   [name], MD  OZEMPIC, 0.25 OR 0.5 MG/DOSE, 2 MG/3ML [facility]  11/02/21   [provider]  predniSONE (DELTASONE) 5 MG tablet Take 2 tablets (10 mg total) by mouth daily with breakfast. 12/04/21   Spero Geralds, MD  Spacer/Aero-Holding Josiah Lobo (AEROCHAMBER MV) inhaler  Use as instructed 05/25/21   [name], MD     Allergies    Ampicillin, Penicillins, Sulfamethoxazole-trimethoprim, Cephalosporins, Elemental sulfur, Mobic [meloxicam], and Nitrofuran derivatives   Review of Systems   Review of Systems Please see HPI for pertinent positives and negatives  Physical Exam BP 123/77   Pulse 78   Temp 98.1 F (36.7 C) (Oral)   Resp 18   Ht '5\' 4"'$  (1.626 m)   Wt 78.5 kg   LMP 10/09/2015   SpO2 98%   BMI 29.70 kg/m   Physical Exam Vitals and nursing note reviewed.  Constitutional:      Appearance: Normal appearance.  HENT:     Head: Normocephalic and atraumatic.     Nose: Nose normal.     Mouth/Throat:     Mouth: Mucous membranes are moist.     Comments: No facial swelling or rash Eyes:     Extraocular Movements: Extraocular movements intact.     Conjunctiva/sclera: Conjunctivae normal.  Cardiovascular:     Rate and Rhythm: Normal rate.  Pulmonary:     Effort: Pulmonary effort is normal.     Breath sounds: Normal breath sounds.  Abdominal:     General: Abdomen is flat.     Palpations: Abdomen is soft.     Tenderness: There is no abdominal tenderness.  Musculoskeletal:        General: No swelling. Normal range of motion.     Cervical back: Neck supple.     Right lower leg: No edema.  Left lower leg: No edema.  Skin:    General: Skin is warm and dry.  Neurological:     General: No focal deficit present.     Mental Status: She is alert and oriented to person, place, and time.     Cranial Nerves: No cranial nerve deficit.     Sensory: No sensory deficit.     Motor: No weakness.  Psychiatric:        Mood and Affect: Mood normal.    ED Results / Procedures / Treatments   EKG EKG Interpretation  Date/Time:  Sunday December 06 2021 06:21:47 EDT Ventricular Rate:  71 PR Interval:  137 QRS Duration: 96 QT Interval:  386 QTC Calculation: 420 R Axis:   11 Text Interpretation: Sinus rhythm Normal ECG No significant change since  last tracing Confirmed by Calvert Cantor [phone]) on 12/06/2021 6:46:41 AM  Procedures Procedures  Medications Ordered in the ED Medications - No data to display  Initial Impression and Plan  Patient with history of polychondritis here with chest pressure. She had some facial swelling yesterday, none apparent today. Will check labs, EKG and CXR to evaluate her chest pain. Currently NSR on monitor.   ED Course   Clinical Course as of 12/06/21 0706  Sun Dec 06, 2021  0649 CBC is unremarkable.  [CS]  [phone] Care of the patient will be signed out to the day shift at 7am.  [CS]    Clinical Course User Index [CS] Truddie Hidden, MD     MDM Rules/Calculators/A&P Medical Decision Making Problems Addressed: Chest pain, unspecified type: acute illness or injury  Amount and/or Complexity of Data Reviewed Labs: ordered. Decision-making details documented in ED Course. Radiology: ordered. ECG/medicine tests: ordered and independent interpretation performed. Decision-making details documented in ED Course.    Final Clinical Impression(s) / ED Diagnoses Final diagnoses:  Chest pain, unspecified type    Rx / DC Orders ED Discharge Orders     None        Truddie Hidden, MD 12/06/21 [phone]

## 2021-12-06 NOTE — ED Triage Notes (Signed)
POV, pt sts that she woke up approx 0550 with midsternal chest pressure that does not radiate, endorses nausea but no other symptoms. pt also sts that right side of face began swelling yesterday took benadryl but cont to have facial swelling and is concerned about this. NAD, amb, a&O x 4

## 2021-12-08 ENCOUNTER — Other Ambulatory Visit (HOSPITAL_COMMUNITY)
Admission: RE | Admit: 2021-12-08 | Discharge: 2021-12-08 | Disposition: A | Discharge status: Home / Self Care | Payer: BC Managed Care – PPO | Source: Ambulatory Visit | Attending: Obstetrics and Gynecology | Admitting: Obstetrics and Gynecology

## 2021-12-08 ENCOUNTER — Inpatient Hospital Stay: Payer: BC Managed Care – PPO | Admitting: Physician Assistant

## 2021-12-08 ENCOUNTER — Ambulatory Visit: Payer: BC Managed Care – PPO | Admitting: Obstetrics and Gynecology

## 2021-12-08 ENCOUNTER — Encounter: Payer: Self-pay | Admitting: Obstetrics and Gynecology

## 2021-12-08 VITALS — BP 100/63 | HR 90

## 2021-12-08 DIAGNOSIS — N898 Other specified noninflammatory disorders of vagina: Secondary | ICD-10-CM

## 2021-12-08 DIAGNOSIS — N393 Stress incontinence (female) (male): Secondary | ICD-10-CM

## 2021-12-08 NOTE — Progress Notes (Deleted)
Acute Office Visit  Subjective:    Patient ID: Michelle Mack, female    DOB: Aug 21, 1965, 56 y.o.   MRN: [id number]  No chief complaint on file.   HPI Patient is in today for ***  Outpatient Medications Prior to Visit  Medication Sig Dispense Refill   amLODipine (NORVASC) 5 MG tablet TAKE 1 TABLET (5 MG TOTAL) BY MOUTH DAILY. 90 tablet 0   atorvastatin (LIPITOR) 20 MG tablet TAKE 1 TABLET BY MOUTH EVERY DAY 90 tablet 0   Cholecalciferol (CVS D3) 50 MCG (2000 UT) CAPS TAKE 1 CAPSULE BY MOUTH EVERY DAY 703 capsule 3   folic acid (FOLVITE) 1 MG tablet Take 1 tablet (1 mg total) by mouth daily. 30 tablet 5   metFORMIN (GLUCOPHAGE-XR) 500 MG 24 hr tablet 1,000 mg daily with breakfast.     methotrexate (RHEUMATREX) 2.5 MG tablet Take 5 tablets (12.5 mg total) by mouth once a week. Caution:Chemotherapy. Protect from light. 20 tablet 5   olmesartan-hydrochlorothiazide (BENICAR HCT) 20-12.5 MG tablet TAKE 1 TABLET BY MOUTH EVERY DAY 90 tablet 0   OZEMPIC, 0.25 OR 0.5 MG/DOSE, 2 MG/3ML SOPN      predniSONE (DELTASONE) 5 MG tablet Take 2 tablets (10 mg total) by mouth daily with breakfast. 60 tablet 5   Spacer/Aero-Holding Chambers (AEROCHAMBER MV) inhaler Use as instructed 1 each 0   No facility-administered medications prior to visit.    Allergies  Allergen Reactions   Ampicillin Shortness Of Breath   Penicillins Shortness Of Breath and Swelling    Has patient had a PCN reaction causing immediate rash, facial/tongue/throat swelling, SOB or lightheadedness with hypotension: {YES} Has patient had a PCN reaction causing severe rash involving mucus membranes or skin necrosis: {NO} Has patient had a PCN reaction that required hospitalization {YES} Has patient had a PCN reaction occurring within the last 10 years: {NO} If all of the above answers are "NO", then may proceed with Cephalosporin use.    Sulfamethoxazole-Trimethoprim Other (See Comments)    "body on fire"   Cephalosporins  Itching   Elemental Sulfur Other (See Comments)    Feeling of burning on the inside  Persist ant cough   Mobic [Meloxicam] Swelling and Rash    Pt states lips also swelled.   Nitrofuran Derivatives Itching    Throat, mouth, hands itch    Review of Systems     Objective:    Physical Exam  LMP 10/09/2015   Wt Readings from Last 3 Encounters:  12/06/21 173 lb (78.5 kg)  12/04/21 173 lb 6.4 oz (78.7 kg)  11/25/21 175 lb (79.4 kg)    Results for orders placed or performed during the hospital encounter of 50/09/38  Basic metabolic panel  Result Value Ref Range   Sodium 141 135 - 145 mmol/L   Potassium 3.8 3.5 - 5.1 mmol/L   Chloride 104 98 - 111 mmol/L   CO2 26 22 - 32 mmol/L   Glucose, Bld 128 (H) 70 - 99 mg/dL   BUN 19 6 - 20 mg/dL   Creatinine, Ser 1.06 (H) 0.44 - 1.00 mg/dL   Calcium 9.6 8.9 - 10.3 mg/dL   GFR, Estimated >60 >60 mL/min   Anion gap 11 5 - 15  CBC with Differential  Result Value Ref Range   WBC 10.2 4.0 - 10.5 K/uL   RBC 4.10 3.87 - 5.11 MIL/uL   Hemoglobin 11.7 (L) 12.0 - 15.0 g/dL   HCT 36.7 36.0 - 46.0 %   MCV  89.5 80.0 - 100.0 fL   MCH 28.5 26.0 - 34.0 pg   MCHC 31.9 30.0 - 36.0 g/dL   RDW 12.8 11.5 - 15.5 %   Platelets 328 150 - 400 K/uL   nRBC 0.0 0.0 - 0.2 %   Neutrophils Relative % 77 %   Neutro Abs 7.9 (H) 1.7 - 7.7 K/uL   Lymphocytes Relative 15 %   Lymphs Abs 1.5 0.7 - 4.0 K/uL   Monocytes Relative 7 %   Monocytes Absolute 0.7 0.1 - 1.0 K/uL   Eosinophils Relative 0 %   Eosinophils Absolute 0.0 0.0 - 0.5 K/uL   Basophils Relative 0 %   Basophils Absolute 0.0 0.0 - 0.1 K/uL   Immature Granulocytes 1 %   Abs Immature Granulocytes 0.05 0.00 - 0.07 K/uL  Troponin I (High Sensitivity)  Result Value Ref Range   Troponin I (High Sensitivity) 8 <18 ng/L  Troponin I (High Sensitivity)  Result Value Ref Range   Troponin I (High Sensitivity) 8 <18 ng/L       Assessment & Plan:  There are no diagnoses linked to this  encounter.   No orders of the defined types were placed in this encounter.   No follow-ups on file.  Irene Pap, PA-C

## 2021-12-08 NOTE — Progress Notes (Signed)
[facility]   Subjective:     Chief Complaint: Pessary Check  History of Present Illness: Michelle Mack is a 56 y.o. female with stress incontinence who presents today for a pessary fitting.   She has a feeling of vaginal discharge and odor- she thinks she maybe has a yeast infection.   Past Medical History: Patient  has a past medical history of Anemia, Back pain, Bronchitis, Cough, DUB (dysfunctional uterine bleeding), Fibroid uterus, GERD (gastroesophageal reflux disease), Hypertension, Infertility, female, OA (osteoarthritis) of knee (04/02/2020), Polychondritis, Recurrent upper respiratory infection (URI), and Type II or unspecified type diabetes mellitus without mention of complication, not stated as uncontrolled.   Past Surgical History: She  has a past surgical history that includes Myomectomy (07/05/2000); Endometrial vaporization w/ versapoint; Bronchoscopy (01/02/2014); LEFT HEART CATH AND CORONARY ANGIOGRAPHY (N/A, 07/19/2018); and Supracervical abdominal hysterectomy.   Medications: She has a current medication list which includes the following prescription(s): amlodipine, atorvastatin, cvs d3, folic acid, metformin, methotrexate, olmesartan-hydrochlorothiazide, ozempic (0.25 or 0.5 mg/dose), prednisone, and aerochamber mv.   Allergies: Patient is allergic to ampicillin, penicillins, sulfamethoxazole-trimethoprim, cephalosporins, elemental sulfur, mobic [meloxicam], and nitrofuran derivatives.   Social History: Patient  reports that she has never smoked. She has never used smokeless tobacco. She reports current alcohol use. She reports that she does not use drugs.      Objective:    BP 100/63   Pulse 90   LMP 10/09/2015  Gen: No apparent distress, A&O x 3. Pelvic Exam: Normal external female genitalia; Bartholin's and Skene's glands normal in appearance; urethral meatus normal in appearance, no urethral masses or discharge.   Speculum exam was  performed. Minimal discharge noted, aptima swab obtained. A size #2 incontinence dish was placed but was too large. This was removed and replaced with a #1 incontinence ring pessary. It was comfortable, stayed in place with valsalva and was an appropriate size on examination, with one finger fitting between the pessary and the vaginal walls. The patient demonstrated proper removal and replacement. Lot #[id number], Exp 09/05/26  POP-Q (11/25/21):    POP-Q   -3                                            Aa   -3                                           Ba   -6.5                                              C    2.5                                            Gh   4                                            Pb   8  tvl    -2                                            Ap   -2                                            Bp   -7                                              D        Assessment/Plan:    Assessment: Michelle Mack is a 56 y.o. with stress incontinence who presents for a pessary fitting.  Plan: - Aptima swab obtained for vaginal discharge.  - She was fitted with a #1 incontinence ring pessary.  - Remove at least weekly and clean with soap and water.   Follow-up in 3-4 weeks for a pessary check or sooner as needed.  All questions were answered.    Jaquita Folds, MD  Time spent: I spent 20 minutes dedicated to the care of this patient on the date of this encounter to include pre-visit review of records, face-to-face time with the patient and post visit documentation and ordering medication/ testing. Additional time was spent on the procedure.

## 2021-12-08 NOTE — Patient Instructions (Signed)
You were fitted with a incontinence ring pessary.  Come back in a few weeks to see how it is working.  You can take it out every week, or sooner if you desire. Clean with liquid soap and water in between uses and leave out over night on the night that you remove it. Call for any problems.

## 2021-12-09 ENCOUNTER — Ambulatory Visit: Payer: BC Managed Care – PPO | Admitting: Physician Assistant

## 2021-12-09 ENCOUNTER — Encounter: Payer: Self-pay | Admitting: Physician Assistant

## 2021-12-09 VITALS — BP 100/60 | HR 69 | Ht 64.0 in | Wt 173.6 lb

## 2021-12-09 DIAGNOSIS — E1122 Type 2 diabetes mellitus with diabetic chronic kidney disease: Secondary | ICD-10-CM | POA: Diagnosis not present

## 2021-12-09 DIAGNOSIS — M941 Relapsing polychondritis: Secondary | ICD-10-CM

## 2021-12-09 DIAGNOSIS — N182 Chronic kidney disease, stage 2 (mild): Secondary | ICD-10-CM

## 2021-12-09 MED ORDER — RYBELSUS 3 MG PO TABS: 3.0000 mg | ORAL_TABLET | Freq: Every day | ORAL | 2 refills | Status: DC

## 2021-12-09 NOTE — Progress Notes (Deleted)
Established Patient Office Visit  Subjective:  Patient ID: Michelle Mack, female    DOB: 19-Dec-1965  Age: 56 y.o. MRN: [id number]  CC:  Chief Complaint  Patient presents with   Follow-up    Er follow up on chest pain. Wants to discuss a vaccine that she's doing.    HPI Michelle Mack presents for follow up from her evaluation at [facility] Emergency Dept for chest pain:   Care was taken over from Dr. Karle Starch.  Patient presents with some midsternal chest pain.  No exertional symptoms.  No ischemic changes on EKG.  She has had 2 negative troponins.  No ongoing symptoms.  She does not have an elevated heart score.  Is appropriate for discharge and close follow-up with her PCP  Reports that she had a repeat pulmonary function test on 12/04/2021 with Dr. Shearon Stalls for shortness of breath and relapsing polychondritis; injected herself with Ozempic (Third dose) 12/02/2021 and afterwards had itchy hands and swelling on the right side of her face; denies recent travel, denies new exposures; blood sugars in am 117; states she is wondering if the ozempic or the bronchodilator from the PFT caused her chest pain symptoms. Today denies chest pain or shortness of breath.   Outpatient Medications Prior to Visit  Medication Sig Dispense Refill   amLODipine (NORVASC) 5 MG tablet TAKE 1 TABLET (5 MG TOTAL) BY MOUTH DAILY. 90 tablet 0   atorvastatin (LIPITOR) 20 MG tablet TAKE 1 TABLET BY MOUTH EVERY DAY 90 tablet 0   Cholecalciferol ([employer] D3) 50 MCG (2000 UT) CAPS TAKE 1 CAPSULE BY MOUTH EVERY DAY 993 capsule 3   folic acid (FOLVITE) 1 MG tablet Take 1 tablet (1 mg total) by mouth daily. 30 tablet 5   metFORMIN (GLUCOPHAGE-XR) 500 MG 24 hr tablet 1,000 mg daily with breakfast.     methotrexate (RHEUMATREX) 2.5 MG tablet Take 5 tablets (12.5 mg total) by mouth once a week. Caution:Chemotherapy. Protect from light. 20 tablet 5   olmesartan-hydrochlorothiazide (BENICAR HCT) 20-12.5 MG tablet TAKE 1 TABLET BY  MOUTH EVERY DAY 90 tablet 0   predniSONE (DELTASONE) 5 MG tablet Take 2 tablets (10 mg total) by mouth daily with breakfast. 60 tablet 5   Spacer/Aero-Holding Chambers (AEROCHAMBER MV) inhaler Use as instructed 1 each 0   OZEMPIC, 0.25 OR 0.5 MG/DOSE, 2 MG/3ML SOPN      No facility-administered medications prior to visit.    Allergies  Allergen Reactions   Ampicillin Shortness Of Breath   Penicillins Shortness Of Breath and Swelling    Has patient had a PCN reaction causing immediate rash, facial/tongue/throat swelling, SOB or lightheadedness with hypotension: {YES} Has patient had a PCN reaction causing severe rash involving mucus membranes or skin necrosis: {NO} Has patient had a PCN reaction that required hospitalization {YES} Has patient had a PCN reaction occurring within the last 10 years: {NO} If all of the above answers are "NO", then may proceed with Cephalosporin use.    Sulfamethoxazole-Trimethoprim Other (See Comments)    "body on fire"   Cephalosporins Itching   Elemental Sulfur Other (See Comments)    Feeling of burning on the inside  Persist ant cough   Mobic [Meloxicam] Swelling and Rash    Pt states lips also swelled.   Nitrofuran Derivatives Itching    Throat, mouth, hands itch    Patient Care Team: [name], MD as PCP - General (Family Medicine) Juanita Craver, MD as Attending Physician (Gastroenterology) Earnstine Regal, PA-C as  Physician Assistant (Obstetrics and Gynecology) Spero Geralds, MD as Consulting Physician (Pulmonary Disease) Landis Martins, DPM as Consulting Physician (Podiatry) Adrian Prows, MD as Consulting Physician (Cardiology) Elmarie Shiley, MD as Consulting Physician (Nephrology) Sherilyn Cooter, MD as Consulting Physician (Orthopedic Surgery) Cindra Presume, MD as Consulting Physician (Plastic Surgery) Macarthur Critchley, OD as Referring Physician (Optometry)  ROS Review of Systems  Constitutional:  Negative for activity change and chills.   HENT:  Negative for congestion and voice change.   Eyes:  Negative for pain and redness.  Respiratory:  Negative for cough and wheezing.   Cardiovascular:  Negative for chest pain.  Gastrointestinal:  Negative for constipation, diarrhea, nausea and vomiting.  Endocrine: Negative for polyuria.  Genitourinary:  Negative for frequency.  Skin:  Negative for color change and rash.  Allergic/Immunologic: Negative for immunocompromised state.  Neurological:  Negative for dizziness.  Psychiatric/Behavioral:  Negative for agitation.      Objective:    Physical Exam Vitals and nursing note reviewed.  Constitutional:      General: She is not in acute distress.    Appearance: She is normal weight. She is not ill-appearing.  HENT:     Head: Normocephalic and atraumatic.     Right Ear: External ear normal.     Left Ear: External ear normal.  Eyes:     Extraocular Movements: Extraocular movements intact.     Conjunctiva/sclera: Conjunctivae normal.     Pupils: Pupils are equal, round, and reactive to light.  Cardiovascular:     Rate and Rhythm: Normal rate and regular rhythm.     Pulses: Normal pulses.     Heart sounds: Normal heart sounds.  Pulmonary:     Effort: Pulmonary effort is normal.     Breath sounds: Normal breath sounds. No wheezing.  Abdominal:     General: Bowel sounds are normal.     Palpations: Abdomen is soft.  Musculoskeletal:        General: Normal range of motion.     Cervical back: Normal range of motion.  Skin:    General: Skin is warm and dry.  Neurological:     Mental Status: She is alert and oriented to person, place, and time.  Psychiatric:        Mood and Affect: Mood normal.    BP 100/60   Pulse 69   Ht '5\' 4"'$  (1.626 m)   Wt 173 lb 9.6 oz (78.7 kg)   LMP 10/09/2015   SpO2 100%   BMI 29.80 kg/m   Wt Readings from Last 3 Encounters:  12/09/21 173 lb 9.6 oz (78.7 kg)  12/06/21 173 lb (78.5 kg)  12/04/21 173 lb 6.4 oz (78.7 kg)    Results for  orders placed or performed during the hospital encounter of 63/84/66  Basic metabolic panel  Result Value Ref Range   Sodium 141 135 - 145 mmol/L   Potassium 3.8 3.5 - 5.1 mmol/L   Chloride 104 98 - 111 mmol/L   CO2 26 22 - 32 mmol/L   Glucose, Bld 128 (H) 70 - 99 mg/dL   BUN 19 6 - 20 mg/dL   Creatinine, Ser 1.06 (H) 0.44 - 1.00 mg/dL   Calcium 9.6 8.9 - 10.3 mg/dL   GFR, Estimated >60 >60 mL/min   Anion gap 11 5 - 15  CBC with Differential  Result Value Ref Range   WBC 10.2 4.0 - 10.5 K/uL   RBC 4.10 3.87 - 5.11 MIL/uL   Hemoglobin 11.7 (  L) 12.0 - 15.0 g/dL   HCT 36.7 36.0 - 46.0 %   MCV 89.5 80.0 - 100.0 fL   MCH 28.5 26.0 - 34.0 pg   MCHC 31.9 30.0 - 36.0 g/dL   RDW 12.8 11.5 - 15.5 %   Platelets 328 150 - 400 K/uL   nRBC 0.0 0.0 - 0.2 %   Neutrophils Relative % 77 %   Neutro Abs 7.9 (H) 1.7 - 7.7 K/uL   Lymphocytes Relative 15 %   Lymphs Abs 1.5 0.7 - 4.0 K/uL   Monocytes Relative 7 %   Monocytes Absolute 0.7 0.1 - 1.0 K/uL   Eosinophils Relative 0 %   Eosinophils Absolute 0.0 0.0 - 0.5 K/uL   Basophils Relative 0 %   Basophils Absolute 0.0 0.0 - 0.1 K/uL   Immature Granulocytes 1 %   Abs Immature Granulocytes 0.05 0.00 - 0.07 K/uL  Troponin I (High Sensitivity)  Result Value Ref Range   Troponin I (High Sensitivity) 8 <18 ng/L  Troponin I (High Sensitivity)  Result Value Ref Range   Troponin I (High Sensitivity) 8 <18 ng/L     Last CBC Lab Results  Component Value Date   WBC 10.2 12/06/2021   HGB 11.7 (L) 12/06/2021   HCT 36.7 12/06/2021   MCV 89.5 12/06/2021   MCH 28.5 12/06/2021   RDW 12.8 12/06/2021   PLT 328 42/70/6237   Last metabolic panel Lab Results  Component Value Date   GLUCOSE 128 (H) 12/06/2021   NA 141 12/06/2021   K 3.8 12/06/2021   CL 104 12/06/2021   CO2 26 12/06/2021   BUN 19 12/06/2021   CREATININE 1.06 (H) 12/06/2021   GFRNONAA >60 12/06/2021   CALCIUM 9.6 12/06/2021   PROT 7.5 01/18/2020   ALBUMIN 3.9 01/18/2020    LABGLOB 2.7 10/04/2017   AGRATIO 1.7 10/04/2017   BILITOT 1.5 (H) 01/18/2020   ALKPHOS 86 01/18/2020   AST 19 01/18/2020   ALT 24 01/18/2020   ANIONGAP 11 12/06/2021   Last hemoglobin A1c Lab Results  Component Value Date   HGBA1C 8.4 09/08/2021      The 10-year ASCVD risk score (Arnett DK, et al., 2019) is: 2.2%    Assessment & Plan:   Problem List Items Addressed This Visit       Endocrine   Type 2 diabetes mellitus, controlled, with renal complications ([place]) - Primary    Stop ozempic, start rybelsus 3 mg qd, follow up with Endocrinology as already scheduled; If symptoms get worse, please call the office for a follow up appointment if available, otherwise go to Urgent Care, call 911 / EMS, or go to the Emergency Department for further evaluation.        Relevant Medications   Semaglutide (RYBELSUS) 3 MG TABS     Musculoskeletal and Integument   Relapsing polychondritis    Follow up with Pulmonology        Meds ordered this encounter  Medications   Semaglutide (RYBELSUS) 3 MG TABS    Sig: Take 3 mg by mouth daily.    Dispense:  30 tablet    Refill:  2    Order Specific Question:   Supervising Provider    Answer:   Denita Lung [[id number]]    Follow-up: Return for Return as Already Scheduled.    Irene Pap, PA-C

## 2021-12-09 NOTE — Assessment & Plan Note (Signed)
Follow up with Pulmonology.

## 2021-12-09 NOTE — Progress Notes (Deleted)
Acute Office Visit  Subjective:    Patient ID: Michelle Mack, female    DOB: 08-04-1965, 56 y.o.   MRN: [id number]  Chief Complaint  Patient presents with   Follow-up    Er follow up on chest pain. Wants to discuss a vaccine that she's doing.    HPI Patient is in today for follow up from her evaluation at [facility] Emergency Dept for chest pain:   Care was taken over from Dr. Karle Starch.  Patient presents with some midsternal chest pain.  No exertional symptoms.  No ischemic changes on EKG.  She has had 2 negative troponins.  No ongoing symptoms.  She does not have an elevated heart score.  Is appropriate for discharge and close follow-up with her PCP  Reports that she had a repeat pulmonary function test on 12/04/2021 with Dr. Shearon Stalls for shortness of breath and relapsing polychondritis; injected herself with Ozempic (Third dose) 12/02/2021 and afterwards had itchy hands and swelling on the right side of her face; denies recent travel, denies new exposures; blood sugars in am 117; states she is wondering if the ozempic or the bronchodilator from the PFT caused her chest pain symptoms. Today denies chest pain or shortness of breath.  Outpatient Medications Prior to Visit  Medication Sig Dispense Refill   amLODipine (NORVASC) 5 MG tablet TAKE 1 TABLET (5 MG TOTAL) BY MOUTH DAILY. 90 tablet 0   atorvastatin (LIPITOR) 20 MG tablet TAKE 1 TABLET BY MOUTH EVERY DAY 90 tablet 0   Cholecalciferol ([employer]S D3) 50 MCG (2000 UT) CAPS TAKE 1 CAPSULE BY MOUTH EVERY DAY 073 capsule 3   folic acid (FOLVITE) 1 MG tablet Take 1 tablet (1 mg total) by mouth daily. 30 tablet 5   metFORMIN (GLUCOPHAGE-XR) 500 MG 24 hr tablet 1,000 mg daily with breakfast.     methotrexate (RHEUMATREX) 2.5 MG tablet Take 5 tablets (12.5 mg total) by mouth once a week. Caution:Chemotherapy. Protect from light. 20 tablet 5   olmesartan-hydrochlorothiazide (BENICAR HCT) 20-12.5 MG tablet TAKE 1 TABLET BY MOUTH EVERY DAY 90 tablet 0    predniSONE (DELTASONE) 5 MG tablet Take 2 tablets (10 mg total) by mouth daily with breakfast. 60 tablet 5   Spacer/Aero-Holding Chambers (AEROCHAMBER MV) inhaler Use as instructed 1 each 0   OZEMPIC, 0.25 OR 0.5 MG/DOSE, 2 MG/3ML SOPN      No facility-administered medications prior to visit.    Allergies  Allergen Reactions   Ampicillin Shortness Of Breath   Penicillins Shortness Of Breath and Swelling    Has patient had a PCN reaction causing immediate rash, facial/tongue/throat swelling, SOB or lightheadedness with hypotension: {YES} Has patient had a PCN reaction causing severe rash involving mucus membranes or skin necrosis: {NO} Has patient had a PCN reaction that required hospitalization {YES} Has patient had a PCN reaction occurring within the last 10 years: {NO} If all of the above answers are "NO", then may proceed with Cephalosporin use.    Sulfamethoxazole-Trimethoprim Other (See Comments)    "body on fire"   Cephalosporins Itching   Elemental Sulfur Other (See Comments)    Feeling of burning on the inside  Persist ant cough   Mobic [Meloxicam] Swelling and Rash    Pt states lips also swelled.   Nitrofuran Derivatives Itching    Throat, mouth, hands itch    Review of Systems  Constitutional:  Negative for activity change and chills.  HENT:  Negative for congestion and voice change.   Eyes:  Negative for pain and redness.  Respiratory:  Negative for cough and wheezing.   Cardiovascular:  Negative for chest pain.  Gastrointestinal:  Negative for constipation, diarrhea, nausea and vomiting.  Endocrine: Negative for polyuria.  Genitourinary:  Negative for frequency.  Skin:  Negative for color change and rash.  Allergic/Immunologic: Negative for immunocompromised state.  Neurological:  Negative for dizziness.  Psychiatric/Behavioral:  Negative for agitation.       Objective:    Physical Exam Vitals and nursing note reviewed.  Constitutional:      General:  She is not in acute distress.    Appearance: Normal appearance. She is not ill-appearing.  HENT:     Head: Normocephalic and atraumatic.     Right Ear: External ear normal.     Left Ear: External ear normal.     Nose: No congestion.  Eyes:     Extraocular Movements: Extraocular movements intact.     Conjunctiva/sclera: Conjunctivae normal.     Pupils: Pupils are equal, round, and reactive to light.  Cardiovascular:     Rate and Rhythm: Normal rate and regular rhythm.     Pulses: Normal pulses.     Heart sounds: Normal heart sounds.  Pulmonary:     Effort: Pulmonary effort is normal.     Breath sounds: Normal breath sounds. No wheezing.  Abdominal:     General: Bowel sounds are normal.     Palpations: Abdomen is soft.  Musculoskeletal:        General: Normal range of motion.     Cervical back: Normal range of motion and neck supple.     Right lower leg: No edema.     Left lower leg: No edema.  Skin:    General: Skin is warm and dry.     Findings: No bruising.  Neurological:     General: No focal deficit present.     Mental Status: She is alert and oriented to person, place, and time.  Psychiatric:        Mood and Affect: Mood normal.        Behavior: Behavior normal.        Thought Content: Thought content normal.    BP 100/60   Pulse 69   Ht '5\' 4"'$  (1.626 m)   Wt 173 lb 9.6 oz (78.7 kg)   LMP 10/09/2015   SpO2 100%   BMI 29.80 kg/m   Wt Readings from Last 3 Encounters:  12/09/21 173 lb 9.6 oz (78.7 kg)  12/06/21 173 lb (78.5 kg)  12/04/21 173 lb 6.4 oz (78.7 kg)    Results for orders placed or performed during the hospital encounter of 29/51/88  Basic metabolic panel  Result Value Ref Range   Sodium 141 135 - 145 mmol/L   Potassium 3.8 3.5 - 5.1 mmol/L   Chloride 104 98 - 111 mmol/L   CO2 26 22 - 32 mmol/L   Glucose, Bld 128 (H) 70 - 99 mg/dL   BUN 19 6 - 20 mg/dL   Creatinine, Ser 1.06 (H) 0.44 - 1.00 mg/dL   Calcium 9.6 8.9 - 10.3 mg/dL   GFR,  Estimated >60 >60 mL/min   Anion gap 11 5 - 15  CBC with Differential  Result Value Ref Range   WBC 10.2 4.0 - 10.5 K/uL   RBC 4.10 3.87 - 5.11 MIL/uL   Hemoglobin 11.7 (L) 12.0 - 15.0 g/dL   HCT 36.7 36.0 - 46.0 %   MCV 89.5 80.0 - 100.0 fL  MCH 28.5 26.0 - 34.0 pg   MCHC 31.9 30.0 - 36.0 g/dL   RDW 12.8 11.5 - 15.5 %   Platelets 328 150 - 400 K/uL   nRBC 0.0 0.0 - 0.2 %   Neutrophils Relative % 77 %   Neutro Abs 7.9 (H) 1.7 - 7.7 K/uL   Lymphocytes Relative 15 %   Lymphs Abs 1.5 0.7 - 4.0 K/uL   Monocytes Relative 7 %   Monocytes Absolute 0.7 0.1 - 1.0 K/uL   Eosinophils Relative 0 %   Eosinophils Absolute 0.0 0.0 - 0.5 K/uL   Basophils Relative 0 %   Basophils Absolute 0.0 0.0 - 0.1 K/uL   Immature Granulocytes 1 %   Abs Immature Granulocytes 0.05 0.00 - 0.07 K/uL  Troponin I (High Sensitivity)  Result Value Ref Range   Troponin I (High Sensitivity) 8 <18 ng/L  Troponin I (High Sensitivity)  Result Value Ref Range   Troponin I (High Sensitivity) 8 <18 ng/L       Assessment & Plan:  1. Controlled type 2 diabetes mellitus with stage 2 chronic kidney disease, without long-term current use of insulin (HCC)  2. Relapsing polychondritis     Endocrine   Type 2 diabetes mellitus, controlled, with renal complications ([place]) - Primary    Stop ozempic, start rybelsus 3 mg qd, follow up with Endocrinology as already scheduled; If symptoms get worse, please call the office for a follow up appointment if available, otherwise go to Urgent Care, call 911 / EMS, or go to the Emergency Department for further evaluation.        Relevant Medications   Semaglutide (RYBELSUS) 3 MG TABS     Musculoskeletal and Integument   Relapsing polychondritis    Follow up with Pulmonology         Meds ordered this encounter  Medications   Semaglutide (RYBELSUS) 3 MG TABS    Sig: Take 3 mg by mouth daily.    Dispense:  30 tablet    Refill:  2    Order Specific Question:   Supervising  Provider    Answer:   Denita Lung [[id number]]    Return for Return as Already Scheduled.  Irene Pap, PA-C

## 2021-12-09 NOTE — Assessment & Plan Note (Signed)
Stop ozempic, start rybelsus 3 mg qd, follow up with Endocrinology as already scheduled; If symptoms get worse, please call the office for a follow up appointment if available, otherwise go to Urgent Care, call 911 / EMS, or go to the Emergency Department for further evaluation.

## 2021-12-09 NOTE — Progress Notes (Deleted)
Established Patient Office Visit  Subjective:  Patient ID: Michelle Mack, female    DOB: Jan 09, 1966  Age: 56 y.o. MRN: [id number]  CC:  Chief Complaint  Patient presents with   Follow-up    Er follow up on chest pain. Wants to discuss a vaccine that she's doing.    HPI Michelle Mack presents for follow up from her evaluation at [facility] Emergency Dept for chest pain. From 12/06/2021 ED note patient presents with some midsternal chest pain. No exertional symptoms. No ischemic changes on EKG. She has had 2 negative troponins. No ongoing symptoms. She does not have an elevated heart score. Is appropriate for discharge and close follow-up with her PCP.  Reports that she had a repeat pulmonary function test on 12/04/2021 with Dr. Shearon Stalls for shortness of breath and relapsing polychondritis; injected herself with Ozempic (Third dose) 12/02/2021 and afterwards had itchy hands and swelling on the right side of her face; denies recent travel, denies new exposures; blood sugars in am 117; states she is wondering if the ozempic or the bronchodilator from the PFT caused her chest pain symptoms. Today denies chest pain or shortness of breath.  Outpatient Medications Prior to Visit  Medication Sig Dispense Refill   amLODipine (NORVASC) 5 MG tablet TAKE 1 TABLET (5 MG TOTAL) BY MOUTH DAILY. 90 tablet 0   atorvastatin (LIPITOR) 20 MG tablet TAKE 1 TABLET BY MOUTH EVERY DAY 90 tablet 0   Cholecalciferol (CVS D3) 50 MCG (2000 UT) CAPS TAKE 1 CAPSULE BY MOUTH EVERY DAY 967 capsule 3   folic acid (FOLVITE) 1 MG tablet Take 1 tablet (1 mg total) by mouth daily. 30 tablet 5   metFORMIN (GLUCOPHAGE-XR) 500 MG 24 hr tablet 1,000 mg daily with breakfast.     methotrexate (RHEUMATREX) 2.5 MG tablet Take 5 tablets (12.5 mg total) by mouth once a week. Caution:Chemotherapy. Protect from light. 20 tablet 5   olmesartan-hydrochlorothiazide (BENICAR HCT) 20-12.5 MG tablet TAKE 1 TABLET BY MOUTH EVERY DAY 90 tablet 0    predniSONE (DELTASONE) 5 MG tablet Take 2 tablets (10 mg total) by mouth daily with breakfast. 60 tablet 5   Spacer/Aero-Holding Chambers (AEROCHAMBER MV) inhaler Use as instructed 1 each 0   OZEMPIC, 0.25 OR 0.5 MG/DOSE, 2 MG/3ML SOPN      No facility-administered medications prior to visit.    Allergies  Allergen Reactions   Ampicillin Shortness Of Breath   Penicillins Shortness Of Breath and Swelling    Has patient had a PCN reaction causing immediate rash, facial/tongue/throat swelling, SOB or lightheadedness with hypotension: {YES} Has patient had a PCN reaction causing severe rash involving mucus membranes or skin necrosis: {NO} Has patient had a PCN reaction that required hospitalization {YES} Has patient had a PCN reaction occurring within the last 10 years: {NO} If all of the above answers are "NO", then may proceed with Cephalosporin use.    Sulfamethoxazole-Trimethoprim Other (See Comments)    "body on fire"   Cephalosporins Itching   Elemental Sulfur Other (See Comments)    Feeling of burning on the inside  Persist ant cough   Mobic [Meloxicam] Swelling and Rash    Pt states lips also swelled.   Nitrofuran Derivatives Itching    Throat, mouth, hands itch    Patient Care Team: [name], MD as PCP - General (Family Medicine) Juanita Craver, MD as Attending Physician (Gastroenterology) Earnstine Regal, PA-C as Physician Assistant (Obstetrics and Gynecology) Spero Geralds, MD as Consulting Physician (Pulmonary  Disease) Landis Martins, DPM as Consulting Physician (Podiatry) Adrian Prows, MD as Consulting Physician (Cardiology) Elmarie Shiley, MD as Consulting Physician (Nephrology) Sherilyn Cooter, MD as Consulting Physician (Orthopedic Surgery) Cindra Presume, MD as Consulting Physician (Plastic Surgery) Macarthur Critchley, OD as Referring Physician (Optometry)  ROS Review of Systems  Constitutional:  Negative for activity change and chills.  HENT:  Negative for  congestion and voice change.   Eyes:  Negative for pain and redness.  Respiratory:  Negative for cough and wheezing.   Cardiovascular:  Negative for chest pain.  Gastrointestinal:  Negative for constipation, diarrhea, nausea and vomiting.  Endocrine: Negative for polyuria.  Genitourinary:  Negative for frequency.  Skin:  Negative for color change and rash.  Allergic/Immunologic: Negative for immunocompromised state.  Neurological:  Negative for dizziness.  Psychiatric/Behavioral:  Negative for agitation.       Objective:    Physical Exam Vitals and nursing note reviewed.  Constitutional:      General: She is not in acute distress.    Appearance: She is normal weight. She is not ill-appearing.  HENT:     Head: Normocephalic and atraumatic.     Right Ear: External ear normal.     Left Ear: External ear normal.  Eyes:     Extraocular Movements: Extraocular movements intact.     Conjunctiva/sclera: Conjunctivae normal.     Pupils: Pupils are equal, round, and reactive to light.  Cardiovascular:     Rate and Rhythm: Normal rate and regular rhythm.     Pulses: Normal pulses.     Heart sounds: Normal heart sounds.  Pulmonary:     Effort: Pulmonary effort is normal.     Breath sounds: Normal breath sounds. No wheezing.  Abdominal:     General: Bowel sounds are normal.     Palpations: Abdomen is soft.  Musculoskeletal:        General: Normal range of motion.     Cervical back: Normal range of motion.  Skin:    General: Skin is warm and dry.  Neurological:     Mental Status: She is alert and oriented to person, place, and time.  Psychiatric:        Mood and Affect: Mood normal.     BP 100/60   Pulse 69   Ht '5\' 4"'$  (1.626 m)   Wt 173 lb 9.6 oz (78.7 kg)   LMP 10/09/2015   SpO2 100%   BMI 29.80 kg/m   Wt Readings from Last 3 Encounters:  12/09/21 173 lb 9.6 oz (78.7 kg)  12/06/21 173 lb (78.5 kg)  12/04/21 173 lb 6.4 oz (78.7 kg)    Results for orders placed or  performed during the hospital encounter of 16/10/96  Basic metabolic panel  Result Value Ref Range   Sodium 141 135 - 145 mmol/L   Potassium 3.8 3.5 - 5.1 mmol/L   Chloride 104 98 - 111 mmol/L   CO2 26 22 - 32 mmol/L   Glucose, Bld 128 (H) 70 - 99 mg/dL   BUN 19 6 - 20 mg/dL   Creatinine, Ser 1.06 (H) 0.44 - 1.00 mg/dL   Calcium 9.6 8.9 - 10.3 mg/dL   GFR, Estimated >60 >60 mL/min   Anion gap 11 5 - 15  CBC with Differential  Result Value Ref Range   WBC 10.2 4.0 - 10.5 K/uL   RBC 4.10 3.87 - 5.11 MIL/uL   Hemoglobin 11.7 (L) 12.0 - 15.0 g/dL   HCT 36.7 36.0 -  46.0 %   MCV 89.5 80.0 - 100.0 fL   MCH 28.5 26.0 - 34.0 pg   MCHC 31.9 30.0 - 36.0 g/dL   RDW 12.8 11.5 - 15.5 %   Platelets 328 150 - 400 K/uL   nRBC 0.0 0.0 - 0.2 %   Neutrophils Relative % 77 %   Neutro Abs 7.9 (H) 1.7 - 7.7 K/uL   Lymphocytes Relative 15 %   Lymphs Abs 1.5 0.7 - 4.0 K/uL   Monocytes Relative 7 %   Monocytes Absolute 0.7 0.1 - 1.0 K/uL   Eosinophils Relative 0 %   Eosinophils Absolute 0.0 0.0 - 0.5 K/uL   Basophils Relative 0 %   Basophils Absolute 0.0 0.0 - 0.1 K/uL   Immature Granulocytes 1 %   Abs Immature Granulocytes 0.05 0.00 - 0.07 K/uL  Troponin I (High Sensitivity)  Result Value Ref Range   Troponin I (High Sensitivity) 8 <18 ng/L  Troponin I (High Sensitivity)  Result Value Ref Range   Troponin I (High Sensitivity) 8 <18 ng/L     {Labs (Optional):23779}  The 10-year ASCVD risk score (Arnett DK, et al., 2019) is: 2.2%    Assessment & Plan:   Problem List Items Addressed This Visit       Endocrine   Type 2 diabetes mellitus, controlled, with renal complications ([place]) - Primary    Stop ozempic, start rybelsus 3 mg qd, follow up with Endocrinology as already scheduled; If symptoms get worse, please call the office for a follow up appointment if available, otherwise go to Urgent Care, call 911 / EMS, or go to the Emergency Department for further evaluation.       Relevant  Medications   Semaglutide (RYBELSUS) 3 MG TABS     Musculoskeletal and Integument   Relapsing polychondritis    Follow up with Pulmonology       Meds ordered this encounter  Medications   Semaglutide (RYBELSUS) 3 MG TABS    Sig: Take 3 mg by mouth daily.    Dispense:  30 tablet    Refill:  2    Order Specific Question:   Supervising Provider    Answer:   Denita Lung [[id number]]    Follow-up: Return for Return as Already Scheduled.    Irene Pap, PA-C

## 2021-12-10 LAB — CERVICOVAGINAL ANCILLARY ONLY
Bacterial Vaginitis (gardnerella): NEGATIVE
Candida Glabrata: NEGATIVE
Candida Vaginitis: NEGATIVE
Chlamydia: NEGATIVE
Comment: NEGATIVE
Comment: NEGATIVE
Comment: NEGATIVE
Comment: NEGATIVE
Comment: NEGATIVE
Comment: NORMAL
Neisseria Gonorrhea: NEGATIVE
Trichomonas: NEGATIVE

## 2021-12-10 NOTE — Progress Notes (Signed)
Established Patient Office Visit  Subjective:  Patient ID: Michelle Mack, female    DOB: 04-20-1966  Age: 56 y.o. MRN: [id number]  CC:  Chief Complaint  Patient presents with   Follow-up    Er follow up on chest pain. Wants to discuss a vaccine that she's doing.    HPI Michelle Mack presents for follow up from her evaluation at [facility] Emergency Dept for chest pain (12/06/2021 ED note patient presents with some midsternal chest pain. No exertional symptoms. No ischemic changes on EKG. She has had 2 negative troponins. No ongoing symptoms. She does not have an elevated heart score. Is appropriate for discharge and close follow-up with her PCP.)  Reports that she had a repeat pulmonary function test on 12/04/2021 with Dr. Shearon Stalls for shortness of breath and relapsing polychondritis; injected herself with Ozempic (Third dose) 12/02/2021 and afterwards had itchy hands and swelling on the right side of her face; denies recent travel, denies new exposures; blood sugars in am 117; states she is wondering if the ozempic or the bronchodilator from the PFT caused her chest pain symptoms. Today denies chest pain or shortness of breath.  Outpatient Medications Prior to Visit  Medication Sig Dispense Refill   amLODipine (NORVASC) 5 MG tablet TAKE 1 TABLET (5 MG TOTAL) BY MOUTH DAILY. 90 tablet 0   atorvastatin (LIPITOR) 20 MG tablet TAKE 1 TABLET BY MOUTH EVERY DAY 90 tablet 0   Cholecalciferol (CVS D3) 50 MCG (2000 UT) CAPS TAKE 1 CAPSULE BY MOUTH EVERY DAY 400 capsule 3   folic acid (FOLVITE) 1 MG tablet Take 1 tablet (1 mg total) by mouth daily. 30 tablet 5   metFORMIN (GLUCOPHAGE-XR) 500 MG 24 hr tablet 1,000 mg daily with breakfast.     methotrexate (RHEUMATREX) 2.5 MG tablet Take 5 tablets (12.5 mg total) by mouth once a week. Caution:Chemotherapy. Protect from light. 20 tablet 5   olmesartan-hydrochlorothiazide (BENICAR HCT) 20-12.5 MG tablet TAKE 1 TABLET BY MOUTH EVERY DAY 90 tablet 0    predniSONE (DELTASONE) 5 MG tablet Take 2 tablets (10 mg total) by mouth daily with breakfast. 60 tablet 5   Spacer/Aero-Holding Chambers (AEROCHAMBER MV) inhaler Use as instructed 1 each 0   OZEMPIC, 0.25 OR 0.5 MG/DOSE, 2 MG/3ML SOPN      No facility-administered medications prior to visit.     Patient Care Team: [name], MD as PCP - General (Family Medicine) Juanita Craver, MD as Attending Physician (Gastroenterology) Earnstine Regal, PA-C as Physician Assistant (Obstetrics and Gynecology) Spero Geralds, MD as Consulting Physician (Pulmonary Disease) Landis Martins, DPM as Consulting Physician (Podiatry) Adrian Prows, MD as Consulting Physician (Cardiology) Elmarie Shiley, MD as Consulting Physician (Nephrology) Sherilyn Cooter, MD as Consulting Physician (Orthopedic Surgery) Cindra Presume, MD as Consulting Physician (Plastic Surgery) Macarthur Critchley, OD as Referring Physician (Optometry)  ROS Review of Systems  Constitutional:  Negative for activity change and chills.  HENT:  Negative for congestion and voice change.   Eyes:  Negative for pain and redness.  Respiratory:  Negative for cough and wheezing.   Cardiovascular:  Negative for chest pain.  Gastrointestinal:  Negative for constipation, diarrhea, nausea and vomiting.  Endocrine: Negative for polyuria.  Genitourinary:  Negative for frequency.  Skin:  Negative for color change and rash.  Allergic/Immunologic: Negative for immunocompromised state.  Neurological:  Negative for dizziness.  Psychiatric/Behavioral:  Negative for agitation.       Objective:    Physical Exam Vitals and nursing note  reviewed.  Constitutional:      General: She is not in acute distress.    Appearance: She is normal weight. She is not ill-appearing.  HENT:     Head: Normocephalic and atraumatic.     Right Ear: External ear normal.     Left Ear: External ear normal.  Eyes:     Extraocular Movements: Extraocular movements intact.      Conjunctiva/sclera: Conjunctivae normal.     Pupils: Pupils are equal, round, and reactive to light.  Cardiovascular:     Rate and Rhythm: Normal rate and regular rhythm.     Pulses: Normal pulses.     Heart sounds: Normal heart sounds.  Pulmonary:     Effort: Pulmonary effort is normal.     Breath sounds: Normal breath sounds. No wheezing.  Abdominal:     General: Bowel sounds are normal.     Palpations: Abdomen is soft.  Musculoskeletal:        General: Normal range of motion.     Cervical back: Normal range of motion.  Skin:    General: Skin is warm and dry.  Neurological:     Mental Status: She is alert and oriented to person, place, and time.  Psychiatric:        Mood and Affect: Mood normal.     BP 100/60   Pulse 69   Ht '5\' 4"'$  (1.626 m)   Wt 173 lb 9.6 oz (78.7 kg)   LMP 10/09/2015   SpO2 100%   BMI 29.80 kg/m   Wt Readings from Last 3 Encounters:  12/09/21 173 lb 9.6 oz (78.7 kg)  12/06/21 173 lb (78.5 kg)  12/04/21 173 lb 6.4 oz (78.7 kg)    Results for orders placed or performed in visit on 12/08/21  Cervicovaginal ancillary only  Result Value Ref Range   Bacterial Vaginitis (gardnerella) Negative    Candida Vaginitis Negative    Candida Glabrata Negative    Trichomonas Negative    Chlamydia Negative    Neisseria Gonorrhea Negative    Comment Normal Reference Range Candida Species - Negative    Comment      Normal Reference Range Bacterial Vaginosis - Negative   Comment Normal Reference Range Candida Galbrata - Negative    Comment Normal Reference Range Trichomonas - Negative    Comment Normal Reference Ranger Chlamydia - Negative    Comment      Normal Reference Range Neisseria Gonorrhea - Negative     Last CBC Lab Results  Component Value Date   WBC 10.2 12/06/2021   HGB 11.7 (L) 12/06/2021   HCT 36.7 12/06/2021   MCV 89.5 12/06/2021   MCH 28.5 12/06/2021   RDW 12.8 12/06/2021   PLT 328 68/06/7516   Last metabolic panel Lab Results   Component Value Date   GLUCOSE 128 (H) 12/06/2021   NA 141 12/06/2021   K 3.8 12/06/2021   CL 104 12/06/2021   CO2 26 12/06/2021   BUN 19 12/06/2021   CREATININE 1.06 (H) 12/06/2021   GFRNONAA >60 12/06/2021   CALCIUM 9.6 12/06/2021   PROT 7.5 01/18/2020   ALBUMIN 3.9 01/18/2020   LABGLOB 2.7 10/04/2017   AGRATIO 1.7 10/04/2017   BILITOT 1.5 (H) 01/18/2020   ALKPHOS 86 01/18/2020   AST 19 01/18/2020   ALT 24 01/18/2020   ANIONGAP 11 12/06/2021   Last lipids Lab Results  Component Value Date   CHOL 135 07/27/2018   HDL 54 07/27/2018   LDLCALC 59  07/02/2020   TRIG 108 07/27/2018   CHOLHDL 2.2 10/28/2017      The 10-year ASCVD risk score (Arnett DK, et al., 2019) is: 2.2%    Assessment & Plan:   Problem List Items Addressed This Visit       Endocrine   Type 2 diabetes mellitus, controlled, with renal complications ([place]) - Primary    Stop ozempic, start rybelsus 3 mg qd, follow up with Endocrinology as already scheduled; If symptoms get worse, please call the office for a follow up appointment if available, otherwise go to Urgent Care, call 911 / EMS, or go to the Emergency Department for further evaluation.       Relevant Medications   Semaglutide (RYBELSUS) 3 MG TABS     Musculoskeletal and Integument   Relapsing polychondritis    Follow up with Pulmonology       Meds ordered this encounter  Medications   Semaglutide (RYBELSUS) 3 MG TABS    Sig: Take 3 mg by mouth daily.    Dispense:  30 tablet    Refill:  2    Order Specific Question:   Supervising Provider    Answer:   Denita Lung [[id number]]    Follow-up: Return for Return as Already Scheduled.    Irene Pap, PA-C

## 2021-12-11 ENCOUNTER — Telehealth: Payer: Self-pay | Admitting: Internal Medicine

## 2021-12-11 ENCOUNTER — Telehealth: Payer: Self-pay | Admitting: Obstetrics and Gynecology

## 2021-12-11 NOTE — Telephone Encounter (Signed)
Pt was called back. Pt wanted to know if the pessary is suppose to come out. Pt was advised to wash the pessary then she can just put it back or bring it back to her next appt.

## 2021-12-11 NOTE — Telephone Encounter (Signed)
ATC Dr. Trudie Reed office (where referral was sent to) to find out what exact records they needs since in the referral note it states that Michelle Mack has already faxed records. No answer as office is closed today. Will route to [facility] pool as well to see if they know what offices might typically ask for in terms of records.

## 2021-12-15 NOTE — Telephone Encounter (Signed)
I called pt and made her aware I sent Dr Mauricio Po records to [facility] on 6/6.  I'm not supposed to send someone else's records.  She states she talked to them today and they had her to go on-line and complete a release so this should take care of it.  Nothing further needed.

## 2021-12-23 DIAGNOSIS — M359 Systemic involvement of connective tissue, unspecified: Secondary | ICD-10-CM | POA: Diagnosis not present

## 2021-12-23 DIAGNOSIS — S63521A Sprain of radiocarpal joint of right wrist, initial encounter: Secondary | ICD-10-CM | POA: Diagnosis not present

## 2021-12-23 DIAGNOSIS — M130 Polyarthritis, unspecified: Secondary | ICD-10-CM | POA: Diagnosis not present

## 2021-12-24 ENCOUNTER — Encounter: Payer: Self-pay | Admitting: Family Medicine

## 2021-12-24 ENCOUNTER — Ambulatory Visit (INDEPENDENT_AMBULATORY_CARE_PROVIDER_SITE_OTHER): Payer: BC Managed Care – PPO | Admitting: Family Medicine

## 2021-12-24 ENCOUNTER — Ambulatory Visit: Payer: BC Managed Care – PPO | Admitting: Obstetrics and Gynecology

## 2021-12-24 VITALS — BP 120/72 | HR 68 | Temp 98.8°F | Ht 64.0 in | Wt 172.0 lb

## 2021-12-24 DIAGNOSIS — J029 Acute pharyngitis, unspecified: Secondary | ICD-10-CM | POA: Diagnosis not present

## 2021-12-24 LAB — POCT RAPID STREP A (OFFICE): Rapid Strep A Screen: NEGATIVE

## 2021-12-24 NOTE — Progress Notes (Signed)
Chief Complaint  Patient presents with   Sore Throat    Sore throat and right ear pain that started at 3am. Home covid test neg this am. No other symptoms. Not taking any OTC rx's just using cough drops.   R ear and R side of the throat started hurting at 3 am.  Denies any runny nose, congestion, sneezing, coughing.  Denies fever, swollen glands. Ear feels better now, still hurts on the right side only with swallowing. She thinks the throat looked red "the whole thing".  Grandbaby had strep throat 2 weeks ago.  She wonders why she keeps getting recurrent sore throats.  PMH, PSH, SH reviewed  Outpatient Encounter Medications as of 12/24/2021  Medication Sig Note   amLODipine (NORVASC) 5 MG tablet TAKE 1 TABLET (5 MG TOTAL) BY MOUTH DAILY.    atorvastatin (LIPITOR) 20 MG tablet TAKE 1 TABLET BY MOUTH EVERY DAY    Cholecalciferol (CVS D3) 50 MCG (2000 UT) CAPS TAKE 1 CAPSULE BY MOUTH EVERY DAY    folic acid (FOLVITE) 1 MG tablet Take 1 tablet (1 mg total) by mouth daily.    metFORMIN (GLUCOPHAGE-XR) 500 MG 24 hr tablet 1,000 mg daily with breakfast. 10/01/2021: Taking 1000 qam and 1000 qpm   methotrexate (RHEUMATREX) 2.5 MG tablet Take 5 tablets (12.5 mg total) by mouth once a week. Caution:Chemotherapy. Protect from light.    olmesartan-hydrochlorothiazide (BENICAR HCT) 20-12.5 MG tablet TAKE 1 TABLET BY MOUTH EVERY DAY    predniSONE (DELTASONE) 5 MG tablet Take 2 tablets (10 mg total) by mouth daily with breakfast.    Semaglutide (RYBELSUS) 3 MG TABS Take 3 mg by mouth daily.    Spacer/Aero-Holding Chambers (AEROCHAMBER MV) inhaler Use as instructed    No facility-administered encounter medications on file as of 12/24/2021.   Allergies reviewed   ROS: no fever, chills, headaches, dizziness, chest pain, sinus pain, cough. No n/v/d or other concerns. See HPI   PHYSICAL EXAM:  BP 120/72   Pulse 68   Temp 98.8 F (37.1 C) (Tympanic)   Ht '5\' 4"'$  (1.626 m)   Wt 172 lb (78 kg)   LMP  10/09/2015   BMI 29.52 kg/m   Pleasant female in no distress HEENT: conjunctiva and sclera are clear, EOMI Nasal mucosa--mod edema, pale with clear mucus R>L.  No sinus tenderness OP--mild cobblestoning.  Tonsils are not enlarged (not visible). Neck: no lymphadenopathy or mass  Rapid strep negative   ASSESSMENT/PLAN:  Sore throat - related to PND, either due to allergies vs early cold sx (and expect congestion to worsen). Zyrtec samples and ES Tylenol samples given - Plan: Rapid Strep A  Suspect either related to allergies (based on exam), vs early URI (in which case will develop worsening congestion in the next day or so). Given HTN, wouldn't use decongestants. Can use Coricidin, or can try the Zyrtec.   Zyrtc samples #20 given And extra strength tylenol 4 packets given  All questions answered.  Reassured no e/o strep throat

## 2021-12-24 NOTE — Patient Instructions (Signed)
Your throat and nasal exam are more consistent with allergies (vs a cold that is about to start). There is no evidence of strep or sores/ulcers.  You were given zyrtec samples.  These are to be take once daily (take it at bedtime if it makes you sleepy).  You can use it for the next few days, but no necessarily need to use it regularly, if your symptoms resolve. You can use it just when you notice any sore throat, nasal congestion, runny nose, etc.  If this makes you sleepy, switch to claritin or allegra.  These are available without a prescription and the generics are fine.

## 2022-01-08 ENCOUNTER — Encounter: Payer: Self-pay | Admitting: Physician Assistant

## 2022-01-08 ENCOUNTER — Ambulatory Visit: Payer: BC Managed Care – PPO | Admitting: Internal Medicine

## 2022-01-08 ENCOUNTER — Ambulatory Visit: Payer: BC Managed Care – PPO | Admitting: Physician Assistant

## 2022-01-08 VITALS — BP 100/60 | Temp 94.0°F | Ht 64.0 in | Wt 172.6 lb

## 2022-01-08 DIAGNOSIS — M79661 Pain in right lower leg: Secondary | ICD-10-CM | POA: Diagnosis not present

## 2022-01-08 DIAGNOSIS — I10 Essential (primary) hypertension: Secondary | ICD-10-CM

## 2022-01-08 DIAGNOSIS — R2241 Localized swelling, mass and lump, right lower limb: Secondary | ICD-10-CM | POA: Diagnosis not present

## 2022-01-08 NOTE — Progress Notes (Deleted)
Acute Office Visit  Subjective:    Patient ID: Michelle Mack, female    DOB: 1966-01-18, 56 y.o.   MRN: [id number]  Chief Complaint  Patient presents with   Acute Visit    Knot on right leg wants to make sure its not a blood clot.    HPI Patient is in today for a know on the anterior, medial side of her right lower leg x 1 week; denies taking otc tylenol or ibuprofen for pain; denies any injury, falls, or trauma; denies pain or pain with walking; states knot is actually getting better;  denies fever/chills, denies nausea/vomiting, denies diarrhea/constipation    Outpatient Medications Prior to Visit  Medication Sig Dispense Refill   amLODipine (NORVASC) 5 MG tablet TAKE 1 TABLET (5 MG TOTAL) BY MOUTH DAILY. 90 tablet 0   atorvastatin (LIPITOR) 20 MG tablet TAKE 1 TABLET BY MOUTH EVERY DAY 90 tablet 0   Cholecalciferol ([employer]S D3) 50 MCG (2000 UT) CAPS TAKE 1 CAPSULE BY MOUTH EVERY DAY 024 capsule 3   folic acid (FOLVITE) 1 MG tablet Take 1 tablet (1 mg total) by mouth daily. 30 tablet 5   metFORMIN (GLUCOPHAGE-XR) 500 MG 24 hr tablet 1,000 mg daily with breakfast.     methotrexate (RHEUMATREX) 2.5 MG tablet Take 5 tablets (12.5 mg total) by mouth once a week. Caution:Chemotherapy. Protect from light. 20 tablet 5   olmesartan-hydrochlorothiazide (BENICAR HCT) 20-12.5 MG tablet TAKE 1 TABLET BY MOUTH EVERY DAY 90 tablet 0   OZEMPIC, 0.25 OR 0.5 MG/DOSE, 2 MG/3ML SOPN Inject into the skin.     predniSONE (DELTASONE) 5 MG tablet Take 2 tablets (10 mg total) by mouth daily with breakfast. 60 tablet 5   Spacer/Aero-Holding Chambers (AEROCHAMBER MV) inhaler Use as instructed 1 each 0   Semaglutide (RYBELSUS) 3 MG TABS Take 3 mg by mouth daily. (Patient not taking: Reported on 01/08/2022) 30 tablet 2   No facility-administered medications prior to visit.    Allergies  Allergen Reactions   Ampicillin Shortness Of Breath   Penicillins Shortness Of Breath and Swelling    Has patient had a  PCN reaction causing immediate rash, facial/tongue/throat swelling, SOB or lightheadedness with hypotension: {YES} Has patient had a PCN reaction causing severe rash involving mucus membranes or skin necrosis: {NO} Has patient had a PCN reaction that required hospitalization {YES} Has patient had a PCN reaction occurring within the last 10 years: {NO} If all of the above answers are "NO", then may proceed with Cephalosporin use.    Sulfamethoxazole-Trimethoprim Other (See Comments)    "body on fire"   Cephalosporins Itching   Elemental Sulfur Other (See Comments)    Feeling of burning on the inside  Persist ant cough   Mobic [Meloxicam] Swelling and Rash    Pt states lips also swelled.   Nitrofuran Derivatives Itching    Throat, mouth, hands itch    Review of Systems  Constitutional:  Negative for activity change and chills.  HENT:  Negative for congestion and voice change.   Eyes:  Negative for pain and redness.  Respiratory:  Negative for cough and wheezing.   Cardiovascular:  Negative for chest pain.  Gastrointestinal:  Negative for constipation, diarrhea, nausea and vomiting.  Endocrine: Negative for polyuria.  Genitourinary:  Negative for frequency.  Skin:  Negative for color change, pallor, rash and wound.  Allergic/Immunologic: Negative for immunocompromised state.  Neurological:  Negative for dizziness.  Psychiatric/Behavioral:  Negative for agitation.  Objective:    Physical Exam Vitals and nursing note reviewed.  Constitutional:      General: She is not in acute distress.    Appearance: Normal appearance. She is not ill-appearing.  HENT:     Head: Normocephalic and atraumatic.     Right Ear: External ear normal.     Left Ear: External ear normal.     Nose: No congestion.  Eyes:     Extraocular Movements: Extraocular movements intact.     Conjunctiva/sclera: Conjunctivae normal.     Pupils: Pupils are equal, round, and reactive to light.   Cardiovascular:     Rate and Rhythm: Normal rate and regular rhythm.     Pulses: Normal pulses.          Dorsalis pedis pulses are 2+ on the right side and 2+ on the left side.       Posterior tibial pulses are 2+ on the right side and 2+ on the left side.     Heart sounds: Normal heart sounds.  Pulmonary:     Effort: Pulmonary effort is normal.     Breath sounds: Normal breath sounds. No wheezing.  Abdominal:     General: Bowel sounds are normal.     Palpations: Abdomen is soft.  Musculoskeletal:        General: Normal range of motion.     Cervical back: Normal range of motion and neck supple.     Right lower leg: No edema.     Left lower leg: No edema.  Skin:    General: Skin is warm and dry.     Findings: Lesion present. No abrasion, abscess, bruising, ecchymosis, erythema, signs of injury, rash or wound.          Comments: Negative Homan's sign  Neurological:     General: No focal deficit present.     Mental Status: She is alert and oriented to person, place, and time.  Psychiatric:        Mood and Affect: Mood normal.        Behavior: Behavior normal.        Thought Content: Thought content normal.     BP 100/60   Temp (!) 94 F (34.4 C)   Ht '5\' 4"'$  (1.626 m)   Wt 172 lb 9.6 oz (78.3 kg)   LMP 10/09/2015   SpO2 96%   BMI 29.63 kg/m   Wt Readings from Last 3 Encounters:  01/08/22 172 lb 9.6 oz (78.3 kg)  12/24/21 172 lb (78 kg)  12/09/21 173 lb 9.6 oz (78.7 kg)    Results for orders placed or performed in visit on 12/24/21  Rapid Strep A  Result Value Ref Range   Rapid Strep A Screen Negative Negative       Assessment & Plan:  1. Mass of right lower leg - US Venous Img Lower Unilateral Right; Future  2. Pain in right lower leg - US Venous Img Lower Unilateral Right; Future  3. Essential hypertension, benign  -otc Tylenol or ibuprofen with food as needed; ultrasound if no improvement  No orders of the defined types were placed in this  encounter.   Return for Return as Already Scheduled.  Irene Pap, PA-C

## 2022-01-08 NOTE — Progress Notes (Deleted)
Acute Office Visit  Subjective:    Patient ID: Michelle Mack, female    DOB: [birth year], 56 y.o.   MRN: [id number]  Chief Complaint  Patient presents with   Acute Visit    Knot on right leg wants to make sure its not a blood clot.    HPI Patient is in today for a "knot" on the side of her lower right leg x 1 week; denies taking otc tylenol or ibuprofen for pain; denies any injury, falls, or trauma; denies pain or pain with walking; states "knot" is actually getting smaller; denies fever/chills, denies nausea/vomiting, denies diarrhea/constipation   Outpatient Medications Prior to Visit  Medication Sig Dispense Refill   amLODipine (NORVASC) 5 MG tablet TAKE 1 TABLET (5 MG TOTAL) BY MOUTH DAILY. 90 tablet 0   atorvastatin (LIPITOR) 20 MG tablet TAKE 1 TABLET BY MOUTH EVERY DAY 90 tablet 0   Cholecalciferol (CVS D3) 50 MCG (2000 UT) CAPS TAKE 1 CAPSULE BY MOUTH EVERY DAY 034 capsule 3   folic acid (FOLVITE) 1 MG tablet Take 1 tablet (1 mg total) by mouth daily. 30 tablet 5   metFORMIN (GLUCOPHAGE-XR) 500 MG 24 hr tablet 1,000 mg daily with breakfast.     methotrexate (RHEUMATREX) 2.5 MG tablet Take 5 tablets (12.5 mg total) by mouth once a week. Caution:Chemotherapy. Protect from light. 20 tablet 5   olmesartan-hydrochlorothiazide (BENICAR HCT) 20-12.5 MG tablet TAKE 1 TABLET BY MOUTH EVERY DAY 90 tablet 0   OZEMPIC, 0.25 OR 0.5 MG/DOSE, 2 MG/3ML SOPN Inject into the skin.     predniSONE (DELTASONE) 5 MG tablet Take 2 tablets (10 mg total) by mouth daily with breakfast. 60 tablet 5   Spacer/Aero-Holding Chambers (AEROCHAMBER MV) inhaler Use as instructed 1 each 0   Semaglutide (RYBELSUS) 3 MG TABS Take 3 mg by mouth daily. (Patient not taking: Reported on 01/08/2022) 30 tablet 2   No facility-administered medications prior to visit.    Allergies  Allergen Reactions   Ampicillin Shortness Of Breath   Penicillins Shortness Of Breath and Swelling    Has patient had a PCN reaction  causing immediate rash, facial/tongue/throat swelling, SOB or lightheadedness with hypotension: {YES} Has patient had a PCN reaction causing severe rash involving mucus membranes or skin necrosis: {NO} Has patient had a PCN reaction that required hospitalization {YES} Has patient had a PCN reaction occurring within the last 10 years: {NO} If all of the above answers are "NO", then may proceed with Cephalosporin use.    Sulfamethoxazole-Trimethoprim Other (See Comments)    "body on fire"   Cephalosporins Itching   Elemental Sulfur Other (See Comments)    Feeling of burning on the inside  Persist ant cough   Mobic [Meloxicam] Swelling and Rash    Pt states lips also swelled.   Nitrofuran Derivatives Itching    Throat, mouth, hands itch    Review of Systems  Constitutional:  Negative for activity change and chills.  HENT:  Negative for congestion and voice change.   Eyes:  Negative for pain and redness.  Respiratory:  Negative for cough and wheezing.   Cardiovascular:  Negative for chest pain.  Gastrointestinal:  Negative for constipation, diarrhea, nausea and vomiting.  Endocrine: Negative for polyuria.  Genitourinary:  Negative for frequency.  Musculoskeletal:  Positive for myalgias.  Skin:  Negative for color change, rash and wound.  Allergic/Immunologic: Negative for immunocompromised state.  Neurological:  Negative for dizziness.  Psychiatric/Behavioral:  Negative for agitation.  Objective:    Physical Exam Vitals and nursing note reviewed.  Constitutional:      General: She is not in acute distress.    Appearance: Normal appearance. She is not ill-appearing.  HENT:     Head: Normocephalic and atraumatic.     Right Ear: External ear normal.     Left Ear: External ear normal.     Nose: No congestion.  Eyes:     Extraocular Movements: Extraocular movements intact.     Conjunctiva/sclera: Conjunctivae normal.     Pupils: Pupils are equal, round, and reactive to  light.  Cardiovascular:     Rate and Rhythm: Normal rate and regular rhythm.     Pulses: Normal pulses.     Heart sounds: Normal heart sounds.  Pulmonary:     Effort: Pulmonary effort is normal.     Breath sounds: Normal breath sounds. No wheezing.  Abdominal:     General: Bowel sounds are normal.     Palpations: Abdomen is soft.  Musculoskeletal:        General: Normal range of motion.     Cervical back: Normal range of motion and neck supple.     Right lower leg: No edema.     Left lower leg: No edema.  Skin:    General: Skin is warm and dry.     Findings: Lesion present. No bruising, erythema, laceration, rash or wound.          Comments: Minimally raised oval mass on right anterior lower leg; tender to palpation; no erythema; no edema; no open wound; 2+ DP & PT pulses  Neurological:     General: No focal deficit present.     Mental Status: She is alert and oriented to person, place, and time.  Psychiatric:        Mood and Affect: Mood normal.        Behavior: Behavior normal.        Thought Content: Thought content normal.     BP 100/60   Temp (!) 94 F (34.4 C)   Ht '5\' 4"'$  (1.626 m)   Wt 172 lb 9.6 oz (78.3 kg)   LMP 10/09/2015   SpO2 96%   BMI 29.63 kg/m   Wt Readings from Last 3 Encounters:  01/08/22 172 lb 9.6 oz (78.3 kg)  12/24/21 172 lb (78 kg)  12/09/21 173 lb 9.6 oz (78.7 kg)    Results for orders placed or performed in visit on 12/24/21  Rapid Strep A  Result Value Ref Range   Rapid Strep A Screen Negative Negative       Assessment & Plan:  1. Mass of right lower leg - US Venous Img Lower Unilateral Right; Future - improving, if worse will get ultrasound 2. Pain in right lower leg - US Venous Img Lower Unilateral Right; Future - OTC tylenol or ibuprofen as needed 3. Essential hypertension, benign -stable   No orders of the defined types were placed in this encounter.   Return for Return as Already Scheduled.  Irene Pap, PA-C

## 2022-01-08 NOTE — Progress Notes (Deleted)
Acute Office Visit  Subjective:    Patient ID: Michelle Mack, female    DOB: 1965/09/18, 56 y.o.   MRN: [id number]  Chief Complaint  Patient presents with   Acute Visit    Knot on right leg wants to make sure its not a blood clot.    HPI Patient is in today for a "knot" on the side of her lower right leg x 1 week; denies taking otc tylenol or ibuprofen for pain; denies any injury, falls, or trauma; denies pain or pain with walking; states "knot" is actually getting smaller; denies fever/chills, denies nausea/vomiting, denies diarrhea/constipation   Outpatient Medications Prior to Visit  Medication Sig Dispense Refill   amLODipine (NORVASC) 5 MG tablet TAKE 1 TABLET (5 MG TOTAL) BY MOUTH DAILY. 90 tablet 0   atorvastatin (LIPITOR) 20 MG tablet TAKE 1 TABLET BY MOUTH EVERY DAY 90 tablet 0   Cholecalciferol (CVS D3) 50 MCG (2000 UT) CAPS TAKE 1 CAPSULE BY MOUTH EVERY DAY 696 capsule 3   folic acid (FOLVITE) 1 MG tablet Take 1 tablet (1 mg total) by mouth daily. 30 tablet 5   metFORMIN (GLUCOPHAGE-XR) 500 MG 24 hr tablet 1,000 mg daily with breakfast.     methotrexate (RHEUMATREX) 2.5 MG tablet Take 5 tablets (12.5 mg total) by mouth once a week. Caution:Chemotherapy. Protect from light. 20 tablet 5   olmesartan-hydrochlorothiazide (BENICAR HCT) 20-12.5 MG tablet TAKE 1 TABLET BY MOUTH EVERY DAY 90 tablet 0   OZEMPIC, 0.25 OR 0.5 MG/DOSE, 2 MG/3ML SOPN Inject into the skin.     predniSONE (DELTASONE) 5 MG tablet Take 2 tablets (10 mg total) by mouth daily with breakfast. 60 tablet 5   Spacer/Aero-Holding Chambers (AEROCHAMBER MV) inhaler Use as instructed 1 each 0   Semaglutide (RYBELSUS) 3 MG TABS Take 3 mg by mouth daily. (Patient not taking: Reported on 01/08/2022) 30 tablet 2   No facility-administered medications prior to visit.    Allergies  Allergen Reactions   Ampicillin Shortness Of Breath   Penicillins Shortness Of Breath and Swelling    Has patient had a PCN reaction  causing immediate rash, facial/tongue/throat swelling, SOB or lightheadedness with hypotension: {YES} Has patient had a PCN reaction causing severe rash involving mucus membranes or skin necrosis: {NO} Has patient had a PCN reaction that required hospitalization {YES} Has patient had a PCN reaction occurring within the last 10 years: {NO} If all of the above answers are "NO", then may proceed with Cephalosporin use.    Sulfamethoxazole-Trimethoprim Other (See Comments)    "body on fire"   Cephalosporins Itching   Elemental Sulfur Other (See Comments)    Feeling of burning on the inside  Persist ant cough   Mobic [Meloxicam] Swelling and Rash    Pt states lips also swelled.   Nitrofuran Derivatives Itching    Throat, mouth, hands itch    Review of Systems  Constitutional:  Negative for activity change and chills.  HENT:  Negative for congestion and voice change.   Eyes:  Negative for pain and redness.  Respiratory:  Negative for cough and wheezing.   Cardiovascular:  Negative for chest pain.  Gastrointestinal:  Negative for constipation, diarrhea, nausea and vomiting.  Endocrine: Negative for polyuria.  Genitourinary:  Negative for frequency.  Musculoskeletal:  Positive for myalgias.  Skin:  Negative for color change, rash and wound.  Allergic/Immunologic: Negative for immunocompromised state.  Neurological:  Negative for dizziness.  Psychiatric/Behavioral:  Negative for agitation.  Objective:    Physical Exam Vitals and nursing note reviewed.  Constitutional:      General: She is not in acute distress.    Appearance: Normal appearance. She is not ill-appearing.  HENT:     Head: Normocephalic and atraumatic.     Right Ear: External ear normal.     Left Ear: External ear normal.     Nose: No congestion.  Eyes:     Extraocular Movements: Extraocular movements intact.     Conjunctiva/sclera: Conjunctivae normal.     Pupils: Pupils are equal, round, and reactive to  light.  Cardiovascular:     Rate and Rhythm: Normal rate and regular rhythm.     Pulses: Normal pulses.     Heart sounds: Normal heart sounds.  Pulmonary:     Effort: Pulmonary effort is normal.     Breath sounds: Normal breath sounds. No wheezing.  Abdominal:     General: Bowel sounds are normal.     Palpations: Abdomen is soft.  Musculoskeletal:        General: Normal range of motion.     Cervical back: Normal range of motion and neck supple.     Right lower leg: No edema.     Left lower leg: No edema.  Skin:    General: Skin is warm and dry.     Findings: Lesion present. No bruising, erythema, laceration, rash or wound.          Comments: Minimally raised oval mass on right anterior lower leg; tender to palpation; no erythema; no edema; no open wound; 2+ DP & PT pulses  Neurological:     General: No focal deficit present.     Mental Status: She is alert and oriented to person, place, and time.  Psychiatric:        Mood and Affect: Mood normal.        Behavior: Behavior normal.        Thought Content: Thought content normal.     BP 100/60   Temp (!) 94 F (34.4 C)   Ht '5\' 4"'$  (1.626 m)   Wt 172 lb 9.6 oz (78.3 kg)   LMP 10/09/2015   SpO2 96%   BMI 29.63 kg/m   Wt Readings from Last 3 Encounters:  01/08/22 172 lb 9.6 oz (78.3 kg)  12/24/21 172 lb (78 kg)  12/09/21 173 lb 9.6 oz (78.7 kg)    Results for orders placed or performed in visit on 12/24/21  Rapid Strep A  Result Value Ref Range   Rapid Strep A Screen Negative Negative       Assessment & Plan:  1. Mass of right lower leg - US Venous Img Lower Unilateral Right; Future - improving, if worse will get ultrasound 2. Pain in right lower leg - US Venous Img Lower Unilateral Right; Future - OTC tylenol or ibuprofen as needed 3. Essential hypertension, benign -stable   No orders of the defined types were placed in this encounter.   Return for Return as Already Scheduled.  Irene Pap, PA-C

## 2022-01-08 NOTE — Progress Notes (Addendum)
Acute Office Visit  Subjective:    Patient ID: Michelle Mack, female    DOB: 02-28-1966, 56 y.o.   MRN: [id number]  Chief Complaint  Patient presents with   Acute Visit    Knot on right leg wants to make sure its not a blood clot.    HPI Patient is in today for a knot on the anterior, medial side of her right lower leg x 1 week; denies taking otc tylenol or ibuprofen for pain; denies any injury, falls, or trauma; denies pain or pain with walking; states knot is actually getting better;  denies fever/chills, denies nausea/vomiting, denies diarrhea/constipation  Outpatient Medications Prior to Visit  Medication Sig Dispense Refill   amLODipine (NORVASC) 5 MG tablet TAKE 1 TABLET (5 MG TOTAL) BY MOUTH DAILY. 90 tablet 0   atorvastatin (LIPITOR) 20 MG tablet TAKE 1 TABLET BY MOUTH EVERY DAY 90 tablet 0   Cholecalciferol (CVS D3) 50 MCG (2000 UT) CAPS TAKE 1 CAPSULE BY MOUTH EVERY DAY 357 capsule 3   folic acid (FOLVITE) 1 MG tablet Take 1 tablet (1 mg total) by mouth daily. 30 tablet 5   metFORMIN (GLUCOPHAGE-XR) 500 MG 24 hr tablet 1,000 mg daily with breakfast.     methotrexate (RHEUMATREX) 2.5 MG tablet Take 5 tablets (12.5 mg total) by mouth once a week. Caution:Chemotherapy. Protect from light. 20 tablet 5   olmesartan-hydrochlorothiazide (BENICAR HCT) 20-12.5 MG tablet TAKE 1 TABLET BY MOUTH EVERY DAY 90 tablet 0   OZEMPIC, 0.25 OR 0.5 MG/DOSE, 2 MG/3ML SOPN Inject into the skin.     predniSONE (DELTASONE) 5 MG tablet Take 2 tablets (10 mg total) by mouth daily with breakfast. 60 tablet 5   Spacer/Aero-Holding Chambers (AEROCHAMBER MV) inhaler Use as instructed 1 each 0   Semaglutide (RYBELSUS) 3 MG TABS Take 3 mg by mouth daily. (Patient not taking: Reported on 01/08/2022) 30 tablet 2   No facility-administered medications prior to visit.      Review of Systems  Constitutional:  Negative for activity change and chills.  HENT:  Negative for congestion and voice change.    Eyes:  Negative for pain and redness.  Respiratory:  Negative for cough and wheezing.   Cardiovascular:  Negative for chest pain.  Gastrointestinal:  Negative for constipation, diarrhea, nausea and vomiting.  Endocrine: Negative for polyuria.  Genitourinary:  Negative for frequency.  Skin:  Negative for color change, pallor, rash and wound.  Allergic/Immunologic: Negative for immunocompromised state.  Neurological:  Negative for dizziness.  Psychiatric/Behavioral:  Negative for agitation.        Objective:    Physical Exam Vitals and nursing note reviewed.  Constitutional:      General: She is not in acute distress.    Appearance: Normal appearance. She is not ill-appearing.  HENT:     Head: Normocephalic and atraumatic.     Right Ear: External ear normal.     Left Ear: External ear normal.     Nose: No congestion.  Eyes:     Extraocular Movements: Extraocular movements intact.     Conjunctiva/sclera: Conjunctivae normal.     Pupils: Pupils are equal, round, and reactive to light.  Cardiovascular:     Rate and Rhythm: Normal rate and regular rhythm.     Pulses: Normal pulses.          Dorsalis pedis pulses are 2+ on the right side and 2+ on the left side.       Posterior tibial pulses  are 2+ on the right side and 2+ on the left side.     Heart sounds: Normal heart sounds.  Pulmonary:     Effort: Pulmonary effort is normal.     Breath sounds: Normal breath sounds. No wheezing.  Abdominal:     General: Bowel sounds are normal.     Palpations: Abdomen is soft.  Musculoskeletal:        General: Normal range of motion.     Cervical back: Normal range of motion and neck supple.     Right lower leg: No swelling.     Left lower leg: No swelling.  Skin:    General: Skin is warm and dry.     Findings: No abrasion, abscess, bruising, burn, ecchymosis, erythema, signs of injury, laceration, rash or wound.          Comments: Right lower leg with oval raised lesion,  flesh-colored, no warmth, no discharge, no open wound, no erythema, no edema, negative Homan's sign, minimal tenderness to touch  Neurological:     General: No focal deficit present.     Mental Status: She is alert and oriented to person, place, and time.  Psychiatric:        Mood and Affect: Mood normal.        Behavior: Behavior normal.        Thought Content: Thought content normal.     BP 100/60   Temp (!) 94 F (34.4 C)   Ht '5\' 4"'$  (1.626 m)   Wt 172 lb 9.6 oz (78.3 kg)   LMP 10/09/2015   SpO2 96%   BMI 29.63 kg/m   Wt Readings from Last 3 Encounters:  01/08/22 172 lb 9.6 oz (78.3 kg)  12/24/21 172 lb (78 kg)  12/09/21 173 lb 9.6 oz (78.7 kg)    Results for orders placed or performed in visit on 12/24/21  Rapid Strep A  Result Value Ref Range   Rapid Strep A Screen Negative Negative       Assessment & Plan:  1. Mass of right lower leg - US Venous Img Lower Unilateral Right; Future  2. Pain in right lower leg - US Venous Img Lower Unilateral Right; Future  3. Essential hypertension, benign  - otc tylenol or ibuprofen with food as needed - right lower extremity ultrasound if no improvement   No orders of the defined types were placed in this encounter.   Return for Return as Already Scheduled.  Irene Pap, PA-C

## 2022-01-08 NOTE — Assessment & Plan Note (Signed)
Stable, continue current management 

## 2022-01-08 NOTE — Progress Notes (Deleted)
Acute Office Visit  Subjective:    Patient ID: Michelle Mack, female    DOB: Sep 29, 1965, 56 y.o.   MRN: [id number]  Chief Complaint  Patient presents with   Acute Visit    Knot on right leg wants to make sure its not a blood clot.     HPI Patient is in today for a knot on the side of her lower right leg x 1 week; denies taking otc tylenol or ibuprofen for pain; denies any injury, falls, or trauma; denies pain or pain with walking; states knot is actually getting smaller; denies fever/chills, denies nausea/vomiting, denies diarrhea/constipation   Outpatient Medications Prior to Visit  Medication Sig Dispense Refill   amLODipine (NORVASC) 5 MG tablet TAKE 1 TABLET (5 MG TOTAL) BY MOUTH DAILY. 90 tablet 0   atorvastatin (LIPITOR) 20 MG tablet TAKE 1 TABLET BY MOUTH EVERY DAY 90 tablet 0   Cholecalciferol (CVS D3) 50 MCG (2000 UT) CAPS TAKE 1 CAPSULE BY MOUTH EVERY DAY 102 capsule 3   folic acid (FOLVITE) 1 MG tablet Take 1 tablet (1 mg total) by mouth daily. 30 tablet 5   metFORMIN (GLUCOPHAGE-XR) 500 MG 24 hr tablet 1,000 mg daily with breakfast.     methotrexate (RHEUMATREX) 2.5 MG tablet Take 5 tablets (12.5 mg total) by mouth once a week. Caution:Chemotherapy. Protect from light. 20 tablet 5   olmesartan-hydrochlorothiazide (BENICAR HCT) 20-12.5 MG tablet TAKE 1 TABLET BY MOUTH EVERY DAY 90 tablet 0   OZEMPIC, 0.25 OR 0.5 MG/DOSE, 2 MG/3ML SOPN Inject into the skin.     predniSONE (DELTASONE) 5 MG tablet Take 2 tablets (10 mg total) by mouth daily with breakfast. 60 tablet 5   Spacer/Aero-Holding Chambers (AEROCHAMBER MV) inhaler Use as instructed 1 each 0   Semaglutide (RYBELSUS) 3 MG TABS Take 3 mg by mouth daily. (Patient not taking: Reported on 01/08/2022) 30 tablet 2   No facility-administered medications prior to visit.    Allergies  Allergen Reactions   Ampicillin Shortness Of Breath   Penicillins Shortness Of Breath and Swelling    Has patient had a PCN reaction  causing immediate rash, facial/tongue/throat swelling, SOB or lightheadedness with hypotension: {YES} Has patient had a PCN reaction causing severe rash involving mucus membranes or skin necrosis: {NO} Has patient had a PCN reaction that required hospitalization {YES} Has patient had a PCN reaction occurring within the last 10 years: {NO} If all of the above answers are "NO", then may proceed with Cephalosporin use.    Sulfamethoxazole-Trimethoprim Other (See Comments)    "body on fire"   Cephalosporins Itching   Elemental Sulfur Other (See Comments)    Feeling of burning on the inside  Persist ant cough   Mobic [Meloxicam] Swelling and Rash    Pt states lips also swelled.   Nitrofuran Derivatives Itching    Throat, mouth, hands itch    Review of Systems  Constitutional:  Negative for activity change and chills.  HENT:  Negative for congestion and voice change.   Eyes:  Negative for pain and redness.  Respiratory:  Negative for cough and wheezing.   Cardiovascular:  Negative for chest pain.  Gastrointestinal:  Negative for constipation, diarrhea, nausea and vomiting.  Endocrine: Negative for polyuria.  Genitourinary:  Negative for frequency.  Musculoskeletal:  Positive for myalgias.  Skin:  Negative for color change, pallor, rash and wound.  Allergic/Immunologic: Negative for immunocompromised state.  Neurological:  Negative for dizziness.  Psychiatric/Behavioral:  Negative for agitation.  Objective:    Physical Exam Vitals and nursing note reviewed.  Constitutional:      General: She is not in acute distress.    Appearance: Normal appearance. She is not ill-appearing.  HENT:     Head: Normocephalic and atraumatic.     Right Ear: External ear normal.     Left Ear: External ear normal.     Nose: No congestion.  Eyes:     Extraocular Movements: Extraocular movements intact.     Conjunctiva/sclera: Conjunctivae normal.     Pupils: Pupils are equal, round, and  reactive to light.  Cardiovascular:     Rate and Rhythm: Normal rate and regular rhythm.     Pulses: Normal pulses.     Heart sounds: Normal heart sounds.  Pulmonary:     Effort: Pulmonary effort is normal.     Breath sounds: Normal breath sounds. No wheezing.  Abdominal:     General: Bowel sounds are normal.     Palpations: Abdomen is soft.  Musculoskeletal:        General: Normal range of motion.     Cervical back: Normal range of motion and neck supple.     Right lower leg: No edema.     Left lower leg: No edema.  Skin:    General: Skin is warm and dry.     Findings: Lesion present. No bruising, erythema or rash.          Comments: Right anterior lower leg - Oval superficial raised flesh-colored area, tender to palpation, no erythema, no edema, no contusion, no open wound or discharge, no warmth, negative Homan's sign  Neurological:     General: No focal deficit present.     Mental Status: She is alert and oriented to person, place, and time.  Psychiatric:        Mood and Affect: Mood normal.        Behavior: Behavior normal.        Thought Content: Thought content normal.     BP 100/60   Temp (!) 94 F (34.4 C)   Ht '5\' 4"'$  (1.626 m)   Wt 172 lb 9.6 oz (78.3 kg)   LMP 10/09/2015   SpO2 96%   BMI 29.63 kg/m   Wt Readings from Last 3 Encounters:  01/08/22 172 lb 9.6 oz (78.3 kg)  12/24/21 172 lb (78 kg)  12/09/21 173 lb 9.6 oz (78.7 kg)    Results for orders placed or performed in visit on 12/24/21  Rapid Strep A  Result Value Ref Range   Rapid Strep A Screen Negative Negative       Assessment & Plan:  1. Mass of right lower leg - US Venous Img Lower Unilateral Right; Future  2. Pain in right lower leg - US Venous Img Lower Unilateral Right; Future  3. Essential hypertension, benign  1. Mass of right lower leg - US Venous Img Lower Unilateral Right; Future - improving, if worse will get ultrasound 2. Pain in right lower leg - US Venous Img Lower  Unilateral Right; Future - OTC tylenol or ibuprofen as needed 3. Essential hypertension, benign -stable  No orders of the defined types were placed in this encounter.   Return for Return as Already Scheduled.  Irene Pap, PA-C

## 2022-01-08 NOTE — Patient Instructions (Signed)
You will get a call to schedule an appointment with Radiology for a right leg ultrasound

## 2022-01-11 ENCOUNTER — Ambulatory Visit (HOSPITAL_BASED_OUTPATIENT_CLINIC_OR_DEPARTMENT_OTHER): Payer: BC Managed Care – PPO | Attending: Pulmonary Disease | Admitting: Pulmonary Disease

## 2022-01-11 DIAGNOSIS — G4730 Sleep apnea, unspecified: Secondary | ICD-10-CM | POA: Insufficient documentation

## 2022-01-11 DIAGNOSIS — G4733 Obstructive sleep apnea (adult) (pediatric): Secondary | ICD-10-CM

## 2022-01-11 DIAGNOSIS — R0683 Snoring: Secondary | ICD-10-CM | POA: Diagnosis not present

## 2022-01-12 ENCOUNTER — Ambulatory Visit
Admission: RE | Admit: 2022-01-12 | Discharge: 2022-01-12 | Disposition: A | Discharge status: Home / Self Care | Payer: BC Managed Care – PPO | Source: Ambulatory Visit | Attending: Physician Assistant | Admitting: Physician Assistant

## 2022-01-12 ENCOUNTER — Other Ambulatory Visit: Payer: BC Managed Care – PPO

## 2022-01-12 DIAGNOSIS — M79661 Pain in right lower leg: Secondary | ICD-10-CM | POA: Diagnosis not present

## 2022-01-12 DIAGNOSIS — R2241 Localized swelling, mass and lump, right lower limb: Secondary | ICD-10-CM

## 2022-01-13 DIAGNOSIS — G4733 Obstructive sleep apnea (adult) (pediatric): Secondary | ICD-10-CM | POA: Diagnosis not present

## 2022-01-13 NOTE — Procedures (Signed)
     Patient Name: Michelle Mack, Michelle Mack Date: 01/11/2022 Gender: Female D.O.B: June 25, 1966 Age (years): 64 Referring Provider: Lenice Llamas MD Height (inches): 38 Interpreting Physician: Chesley Mires MD, ABSM Weight (lbs): 176 RPSGT: Zadie Rhine BMI: 30 MRN: [id number] Neck Size: 13.50  CLINICAL INFORMATION Sleep Study Type: NPSG  Indication for sleep study: OSA, Snoring  Epworth Sleepiness Score: 6  SLEEP STUDY TECHNIQUE As per the AASM Manual for the Scoring of Sleep and Associated Events v2.3 (April 2016) with a hypopnea requiring 4% desaturations.  The channels recorded and monitored were frontal, central and occipital EEG, electrooculogram (EOG), submentalis EMG (chin), nasal and oral airflow, thoracic and abdominal wall motion, anterior tibialis EMG, snore microphone, electrocardiogram, and pulse oximetry.  MEDICATIONS Medications self-administered by patient taken the night of the study : N/A  SLEEP ARCHITECTURE The study was initiated at 9:57:36 PM and ended at 4:33:37 AM.  Sleep onset time was 7.7 minutes and the sleep efficiency was 87.1%%. The total sleep time was 345 minutes.  Stage REM latency was 100.0 minutes.  The patient spent 6.4%% of the night in stage N1 sleep, 75.1%% in stage N2 sleep, 1.4%% in stage N3 and 17.1% in REM.  Alpha intrusion was absent.  Supine sleep was 8.55%.  RESPIRATORY PARAMETERS The overall apnea/hypopnea index (AHI) was 3.1 per hour. There were 2 total apneas, including 2 obstructive, 0 central and 0 mixed apneas. There were 16 hypopneas and 10 RERAs.  The AHI during Stage REM sleep was 14.2 per hour.  AHI while supine was 0.0 per hour.  The mean oxygen saturation was 93.3%. The minimum SpO2 during sleep was 84.0%.  She spent 2.7 minutes of test time with an SpO2 < 88%.  Supplemental oxygen was not applied during this study.  Loud snoring was noted during this study.  CARDIAC DATA The 2 lead EKG demonstrated sinus  rhythm. The mean heart rate was 71.3 beats per minute. Other EKG findings include: None.  LEG MOVEMENT DATA The total PLMS were 0 with a resulting PLMS index of 0.0. Associated arousal with leg movement index was 0.0 .  IMPRESSIONS - Her overall AHI was 3.1 with an SpO2 low of 84%.  Based on this she does not qualify for a diagnosis of obstructive sleep apnea. - The patient snored with loud snoring volume.  DIAGNOSIS - Snoring.  RECOMMENDATIONS - Avoid alcohol, sedatives and other CNS depressants that may worsen sleep apnea and disrupt normal sleep architecture. - Sleep hygiene should be reviewed to assess factors that may improve sleep quality. - Weight management and regular exercise should be initiated or continued if appropriate.  [Electronically signed] 01/13/2022 04:16 PM  Chesley Mires MD, ABSM Diplomate, American Board of Sleep Medicine NPI: [id number]  [place] PH: [phone]   FX: [phone] [place]

## 2022-01-20 ENCOUNTER — Telehealth: Payer: Self-pay | Admitting: *Deleted

## 2022-01-20 NOTE — Telephone Encounter (Signed)
Patient advised.

## 2022-01-20 NOTE — Telephone Encounter (Signed)
Patient called in reference to the u/s that [facility] ordered on right leg. She wants to know if the Baker's cyst is something that is treated? She still has palpable mass. Please advise.

## 2022-01-20 NOTE — Telephone Encounter (Signed)
Results showed: IMPRESSION: 1. No evidence of DVT within the right lower extremity. 2. Patient's palpable area of concern involving the medial mid aspect of the right calf correlates with an approximately 2.1 cm subcutaneous lipoma. 3. Approximally 5.1 cm right-sided suspected Baker's cyst.   I didn't examine patient. The palpable concern was a fatty tumor (lipoma) that will always be there. They did mention a possible Baker's cyst.  This is in the back of the knee, and often gets inflamed related to underlying arthritis.   If she is having knee pain, we can send her to ortho.  If no pain in the knee, no need to see any specialist.

## 2022-01-21 ENCOUNTER — Ambulatory Visit: Payer: BC Managed Care – PPO | Admitting: Podiatry

## 2022-01-21 DIAGNOSIS — B351 Tinea unguium: Secondary | ICD-10-CM | POA: Diagnosis not present

## 2022-01-21 DIAGNOSIS — L84 Corns and callosities: Secondary | ICD-10-CM

## 2022-01-21 DIAGNOSIS — M21611 Bunion of right foot: Secondary | ICD-10-CM | POA: Diagnosis not present

## 2022-01-21 DIAGNOSIS — E1165 Type 2 diabetes mellitus with hyperglycemia: Secondary | ICD-10-CM | POA: Diagnosis not present

## 2022-01-21 NOTE — Progress Notes (Signed)
Subjective: Chief Complaint  Patient presents with   Callouses    Callus trim on hallux Bilaterally, pt states she is having pain in the right foot bunion, pt rates the pain a 3 out of 10,  A1c- 8.4 BG- 137 (this morning)    56 year old female presents the office with the above complaints.  She states she has calluses on both of her big toes causing pain as well as bunions.  No open lesions she reports.  She tries to to keep the areas offloaded and she has tried changing shoes.  Objective: AAO x3, NAD DP/PT pulses palpable bilaterally, CRT less than 3 seconds Sensation intact with Semmes Weinstein monofilament Significant bunions present bilaterally resulting in hallux abductus.  There is callus formation on the hallux IPJ bilaterally.  There was some minimal dried blood under the callus.  Upon debridement there is no underlying ulceration drainage or any signs of infection. Left second digit toenails hypertrophic, dystrophic with darkened discoloration.  Likely some dried blood under the toenail.  There is no extension of any hyperpigmentation of the surrounding skin.  No edema, erythema. No pain with calf compression, swelling, warmth, erythema  Assessment: 56 year old female with bilateral bunion deformity resulted in hyperkeratotic lesion; left second digit onychodystrophy  Plan: -All treatment options discussed with the patient including all alternatives, risks, complications.  -We discussed surgery for bunions however she started glucose control and A1c to be under 8 before elective surgery.  However at some point she would like to have this done. -Sharply debrided hyperkeratotic lesions without any complications or bleeding.  Recommend moisturizer and offloading.  Dispensed offloading pads. -Sharply debrided left second nail without any complications or bleeding.  To the nail for culture, pathology to [facility] labs.  -Patient encouraged to call the office with any questions, concerns,  change in symptoms.   *X-ray foot for bunions if she wants to proceed with surgery  Trula Slade DPM

## 2022-01-21 NOTE — Patient Instructions (Signed)

## 2022-01-29 ENCOUNTER — Encounter: Payer: Self-pay | Admitting: Obstetrics and Gynecology

## 2022-01-29 ENCOUNTER — Ambulatory Visit: Payer: BC Managed Care – PPO | Admitting: Medical

## 2022-01-29 ENCOUNTER — Ambulatory Visit: Payer: BC Managed Care – PPO | Admitting: Obstetrics and Gynecology

## 2022-01-29 VITALS — BP 99/63 | HR 79 | Wt 170.0 lb

## 2022-01-29 VITALS — BP 116/78 | HR 84 | Temp 98.6°F | Wt 171.4 lb

## 2022-01-29 DIAGNOSIS — N393 Stress incontinence (female) (male): Secondary | ICD-10-CM

## 2022-01-29 DIAGNOSIS — N3281 Overactive bladder: Secondary | ICD-10-CM | POA: Diagnosis not present

## 2022-01-29 DIAGNOSIS — H5789 Other specified disorders of eye and adnexa: Secondary | ICD-10-CM

## 2022-01-29 MED ORDER — CIPROFLOXACIN HCL 0.3 % OP SOLN: 1.0000 [drp] | OPHTHALMIC | 0 refills | Status: DC

## 2022-01-29 MED ORDER — SOLIFENACIN SUCCINATE 5 MG PO TABS: 5.0000 mg | ORAL_TABLET | Freq: Every day | ORAL | 5 refills | Status: DC

## 2022-01-29 NOTE — Patient Instructions (Addendum)
Currently your eye looks a little irritated but no obvious infection   Recommendations Use cool moist compresses over the weekend throughout the day Shower twice daily to let the shower water flush over your face and eyes Wash hands with soap and water frequently Consider Thera tears for wetting/lubricating the eyes If you start getting itchy worse red eyes that are watery, you can use over the counter Pataday allergy eye drops However, if you get more goopy, crusty, watery, and worse red eye throughout the day or weekend ,then begin the antibiotic drops

## 2022-01-29 NOTE — Progress Notes (Signed)
[facility]   Subjective:     Chief Complaint: Pessary Check Michelle Mack is a 56 y.o. female here for a pessary check. Pt said she has no control when she has to urinate with the pessary in.)  History of Present Illness: Michelle Mack is a 56 y.o. female with stress incontinence and OAB who is following up from her pessary fitting. Last visit she was fit with a #1 incontinence ring.  With the pessary in place, cannot hold her urine on the way to the bathroom when she used to be able to before. But she has less leakage overall with cough or sneeze. When she has a bowel movement, it falls out as well. She has not had issues with removal/ replacement on her own.   She states she is still working on blood sugar control and has been started on Ozempic.   Past Medical History: Patient  has a past medical history of Anemia, Back pain, Bronchitis, Cough, DUB (dysfunctional uterine bleeding), Fibroid uterus, GERD (gastroesophageal reflux disease), Hypertension, Infertility, female, OA (osteoarthritis) of knee (04/02/2020), Polychondritis, Recurrent upper respiratory infection (URI), and Type II or unspecified type diabetes mellitus without mention of complication, not stated as uncontrolled.   Past Surgical History: She  has a past surgical history that includes Myomectomy (07/05/2000); Endometrial vaporization w/ versapoint; Bronchoscopy (01/02/2014); LEFT HEART CATH AND CORONARY ANGIOGRAPHY (N/A, 07/19/2018); and Supracervical abdominal hysterectomy.   Medications: She has a current medication list which includes the following prescription(s): amlodipine, atorvastatin, cvs d3, folic acid, metformin, methotrexate, olmesartan-hydrochlorothiazide, ozempic (0.25 or 0.5 mg/dose), prednisone, rybelsus, and aerochamber mv.   Allergies: Patient is allergic to ampicillin, penicillins, sulfamethoxazole-trimethoprim, cephalosporins, elemental sulfur, mobic [meloxicam], and nitrofuran  derivatives.   Social History: Patient  reports that she has never smoked. She has never been exposed to tobacco smoke. She has never used smokeless tobacco. She reports current alcohol use. She reports that she does not use drugs.      Objective:    BP 99/63   Pulse 79   Wt 170 lb (77.1 kg)   LMP 10/09/2015   BMI 29.18 kg/m  Gen: No apparent distress, A&O x 3. Pelvic Exam: Normal external female genitalia; Bartholin's and Skene's glands normal in appearance; urethral meatus normal in appearance, no urethral masses or discharge.   #1 incontinence ring pessary was removed. A 3in hodge pessary was placed. It was comfortable, stayed in place with valsalva and voiding and was an appropriate size on examination, with one finger fitting between the pessary and the vaginal walls. Lot #[id number], Exp 01/21/28  POP-Q (11/25/21):    POP-Q   -3                                            Aa   -3                                           Ba   -6.5                                              C    2.5  Gh   4                                            Pb   8                                            tvl    -2                                            Ap   -2                                            Bp   -7                                              D        Assessment/Plan:    Assessment: Michelle Mack is a 56 y.o. with stress incontinence and OAB  Plan: - For urgency symptoms, will start medication. Prescription provided for Vesicare '5mg'$  daily.  - She was fitted with a 3in hodge pessary. She will alternate this with the #1 incontinence ring pessary to see which works better for her.  - Remove at least weekly and clean with soap and water.   Return 6 weeks  Jaquita Folds, MD

## 2022-01-29 NOTE — Progress Notes (Signed)
Subjective: Chief Complaint  Patient presents with   other    Rt. Eye red and had goop coming out of it this morning itches around the eye,    Here for eye c/o.  Awoke with symptoms today..  She notes right eye with redness, goop coming out this morning, itchy.  Not particularly water.   Left eye feels fine.  No vision change.  Sees eye doctor yearly but due.  No hx/o glaucoma or significant eye disease.  No other aggravating or relieving factors. No other complaint.   Past Medical History:  Diagnosis Date   Anemia    on iron   Back pain    tx with ibuprofen '800mg'$    Bronchitis    treated recently by abx   Cough    non-productive   DUB (dysfunctional uterine bleeding)    Fibroid uterus    GYN--Dr. Rivard   GERD (gastroesophageal reflux disease)    Hypertension    Infertility, female    OA (osteoarthritis) of knee 04/02/2020   Mod osteoarthritis, medial compartment of L knee, per x-rays at [facility].  Cortisone injection by Dr. Layne Benton 03/05/20   Polychondritis    Recurrent upper respiratory infection (URI)    in October - tx with abx   Type II or unspecified type diabetes mellitus without mention of complication, not stated as uncontrolled    diagnosed by Dr. Berdine Addison   Current Outpatient Medications on File Prior to Visit  Medication Sig Dispense Refill   amLODipine (NORVASC) 5 MG tablet TAKE 1 TABLET (5 MG TOTAL) BY MOUTH DAILY. 90 tablet 0   atorvastatin (LIPITOR) 20 MG tablet TAKE 1 TABLET BY MOUTH EVERY DAY 90 tablet 0   Cholecalciferol (CVS D3) 50 MCG (2000 UT) CAPS TAKE 1 CAPSULE BY MOUTH EVERY DAY 756 capsule 3   folic acid (FOLVITE) 1 MG tablet Take 1 tablet (1 mg total) by mouth daily. 30 tablet 5   metFORMIN (GLUCOPHAGE-XR) 500 MG 24 hr tablet 1,000 mg daily with breakfast.     methotrexate (RHEUMATREX) 2.5 MG tablet Take 5 tablets (12.5 mg total) by mouth once a week. Caution:Chemotherapy. Protect from light. 20 tablet 5   olmesartan-hydrochlorothiazide (BENICAR HCT)  20-12.5 MG tablet TAKE 1 TABLET BY MOUTH EVERY DAY 90 tablet 0   OZEMPIC, 0.25 OR 0.5 MG/DOSE, 2 MG/3ML SOPN Inject into the skin.     predniSONE (DELTASONE) 5 MG tablet Take 2 tablets (10 mg total) by mouth daily with breakfast. 60 tablet 5   solifenacin (VESICARE) 5 MG tablet Take 1 tablet (5 mg total) by mouth daily. 30 tablet 5   Spacer/Aero-Holding Chambers (AEROCHAMBER MV) inhaler Use as instructed 1 each 0   No current facility-administered medications on file prior to visit.     Objective: BP 116/78   Pulse 84   Temp 98.6 F (37 C)   Wt 171 lb 6.4 oz (77.7 kg)   LMP 10/09/2015   BMI 29.42 kg/m   General: Well-developed well-nourished no acute stress Right eye with just slight conjunctival erythema, no other obvious crusting, pus, stye, or foreign body.  PERRLA, EOMI, visual acuity normal, Otherwise eyes appear normal, no abnormality of the pupils, no other irritation or redness     Assessment: Encounter Diagnoses  Name Primary?   Red eye Yes   Eye irritation      Plan: We discussed symptoms and concerns.  Right now it is too early to say if this is going to be an eye infection  versus just some irritation.  She only had symptoms this morning without persistent drainage or goopy or watery throughout the day.  She will use cool moist compresses over the weekend, possibly TheraTears for lubrication.  We also discussed possibly using Pataday drops if there is more redness and irritation without other signs of pinkeye.  She certainly does not have obvious eye infection today, however if she continues or starts getting more consistent goopy drainage or more watery red eye that she can use the antibiotic drops sent.  But as of right now advise she hold off on using that medication as it is not obvious she has an eye infection.   Patient Instructions  Currently your eye looks a little irritated but no obvious infection   Recommendations Use cool moist compresses over the  weekend throughout the day Shower twice daily to let the shower water flush over your face and eyes Wash hands with soap and water frequently Consider Thera tears for wetting/lubricating the eyes If you start getting itchy worse red eyes that are watery, you can use over the counter Pataday allergy eye drops However, if you get more goopy, crusty, watery, and worse red eye throughout the day or weekend ,then begin the antibiotic drops  Harneet was seen today for other.  Diagnoses and all orders for this visit:  Red eye  Eye irritation  Other orders -     ciprofloxacin (CILOXAN) 0.3 % ophthalmic solution; Place 1 drop into the right eye every 2 (two) hours.    F/u prn

## 2022-02-03 DIAGNOSIS — H44821 Luxation of globe, right eye: Secondary | ICD-10-CM | POA: Diagnosis not present

## 2022-02-05 ENCOUNTER — Ambulatory Visit: Payer: BC Managed Care – PPO | Admitting: Obstetrics and Gynecology

## 2022-02-05 NOTE — Progress Notes (Deleted)
[facility]   Subjective:     Chief Complaint: No chief complaint on file.  History of Present Illness: TATUMN CORBRIDGE is a 56 y.o. female with stress incontinence and OAB who is following up from her pessary fitting. Last visit she was fit with a #1 incontinence ring.  With the pessary in place, cannot hold her urine on the way to the bathroom when she used to be able to before. But she has less leakage overall with cough or sneeze. When she has a bowel movement, it falls out as well. She has not had issues with removal/ replacement on her own.   She states she is still working on blood sugar control and has been started on Ozempic.   Past Medical History: Patient  has a past medical history of Anemia, Back pain, Bronchitis, Cough, DUB (dysfunctional uterine bleeding), Fibroid uterus, GERD (gastroesophageal reflux disease), Hypertension, Infertility, female, OA (osteoarthritis) of knee (04/02/2020), Polychondritis, Recurrent upper respiratory infection (URI), and Type II or unspecified type diabetes mellitus without mention of complication, not stated as uncontrolled.   Past Surgical History: She  has a past surgical history that includes Myomectomy (07/05/2000); Endometrial vaporization w/ versapoint; Bronchoscopy (01/02/2014); LEFT HEART CATH AND CORONARY ANGIOGRAPHY (N/A, 07/19/2018); and Supracervical abdominal hysterectomy.   Medications: She has a current medication list which includes the following prescription(s): amlodipine, atorvastatin, cvs d3, ciprofloxacin, folic acid, metformin, methotrexate, olmesartan-hydrochlorothiazide, ozempic (0.25 or 0.5 mg/dose), prednisone, solifenacin, and aerochamber mv.   Allergies: Patient is allergic to ampicillin, penicillins, sulfamethoxazole-trimethoprim, cephalosporins, elemental sulfur, mobic [meloxicam], and nitrofuran derivatives.   Social History: Patient  reports that she has never smoked. She has never been exposed to  tobacco smoke. She has never used smokeless tobacco. She reports current alcohol use. She reports that she does not use drugs.      Objective:    LMP 10/09/2015  Gen: No apparent distress, A&O x 3. Pelvic Exam: Normal external female genitalia; Bartholin's and Skene's glands normal in appearance; urethral meatus normal in appearance, no urethral masses or discharge.   #1 incontinence ring pessary was removed. A 3in hodge pessary was placed. It was comfortable, stayed in place with valsalva and voiding and was an appropriate size on examination, with one finger fitting between the pessary and the vaginal walls. Lot #[id number], Exp 01/21/28  POP-Q (11/25/21):    POP-Q   -3                                            Aa   -3                                           Ba   -6.5                                              C    2.5                                            Gh   4  Pb   8                                            tvl    -2                                            Ap   -2                                            Bp   -7                                              D        Assessment/Plan:    Assessment: Ms. Molter is a 56 y.o. with stress incontinence and OAB  Plan: - For urgency symptoms, will start medication. Prescription provided for Vesicare '5mg'$  daily.  - She was fitted with a 3in hodge pessary. She will alternate this with the #1 incontinence ring pessary to see which works better for her.  - Remove at least weekly and clean with soap and water.   Return 6 weeks  Jaquita Folds, MD

## 2022-02-07 ENCOUNTER — Other Ambulatory Visit: Payer: Self-pay | Admitting: Family Medicine

## 2022-02-07 DIAGNOSIS — E559 Vitamin D deficiency, unspecified: Secondary | ICD-10-CM

## 2022-02-07 DIAGNOSIS — E7800 Pure hypercholesterolemia, unspecified: Secondary | ICD-10-CM

## 2022-02-08 DIAGNOSIS — N898 Other specified noninflammatory disorders of vagina: Secondary | ICD-10-CM | POA: Diagnosis not present

## 2022-02-12 ENCOUNTER — Telehealth: Payer: Self-pay | Admitting: *Deleted

## 2022-02-12 NOTE — Telephone Encounter (Signed)
Patient is calling for culture results, not in epic, contacted [facility] and spoke w/ Nathanel, did not receive, may have to recollect.

## 2022-02-16 DIAGNOSIS — H44821 Luxation of globe, right eye: Secondary | ICD-10-CM | POA: Diagnosis not present

## 2022-02-16 NOTE — Telephone Encounter (Signed)
Left message on voicemail for patient to call back to schedule appointment with nurse to have samples recollected at no charge.

## 2022-02-16 NOTE — Telephone Encounter (Signed)
Patient returned call and said that she has no nails to recollect at this time, will call back to schedule when the nails have regrown.

## 2022-02-17 DIAGNOSIS — H0270 Unspecified degenerative disorders of eyelid and periocular area: Secondary | ICD-10-CM | POA: Diagnosis not present

## 2022-02-17 DIAGNOSIS — H11823 Conjunctivochalasis, bilateral: Secondary | ICD-10-CM | POA: Diagnosis not present

## 2022-02-17 DIAGNOSIS — H44821 Luxation of globe, right eye: Secondary | ICD-10-CM | POA: Diagnosis not present

## 2022-02-17 DIAGNOSIS — H05243 Constant exophthalmos, bilateral: Secondary | ICD-10-CM | POA: Diagnosis not present

## 2022-02-17 DIAGNOSIS — H16211 Exposure keratoconjunctivitis, right eye: Secondary | ICD-10-CM | POA: Diagnosis not present

## 2022-03-10 ENCOUNTER — Encounter: Payer: Self-pay | Admitting: Internal Medicine

## 2022-03-11 ENCOUNTER — Ambulatory Visit: Payer: BC Managed Care – PPO | Admitting: Internal Medicine

## 2022-03-15 DIAGNOSIS — Z7985 Long-term (current) use of injectable non-insulin antidiabetic drugs: Secondary | ICD-10-CM | POA: Diagnosis not present

## 2022-03-15 DIAGNOSIS — Z7984 Long term (current) use of oral hypoglycemic drugs: Secondary | ICD-10-CM | POA: Diagnosis not present

## 2022-03-15 DIAGNOSIS — E1165 Type 2 diabetes mellitus with hyperglycemia: Secondary | ICD-10-CM | POA: Diagnosis not present

## 2022-03-16 ENCOUNTER — Ambulatory Visit: Payer: BC Managed Care – PPO | Admitting: Adult Health

## 2022-03-16 ENCOUNTER — Encounter: Payer: Self-pay | Admitting: Adult Health

## 2022-03-16 VITALS — BP 128/72 | HR 82 | Temp 98.5°F | Ht 64.0 in | Wt 170.4 lb

## 2022-03-16 DIAGNOSIS — R0602 Shortness of breath: Secondary | ICD-10-CM

## 2022-03-16 DIAGNOSIS — G4733 Obstructive sleep apnea (adult) (pediatric): Secondary | ICD-10-CM

## 2022-03-16 DIAGNOSIS — M941 Relapsing polychondritis: Secondary | ICD-10-CM | POA: Diagnosis not present

## 2022-03-16 NOTE — Assessment & Plan Note (Signed)
Repeat sleep study shows no significant sleep apnea or nocturnal hypoxemia.  Patient may remain off of CPAP.  Patient is encouraged on healthy sleep regimen.  Plan  Patient Instructions  May remain off CPAP  Healthy sleep regimen   Try to decrease Prednisone '10mg'$  alternating '5mg'$  daily .  Continue on Methotrexate weekly  Labs today .   Follow up with Dr Shearon Stalls in 3 months and As needed

## 2022-03-16 NOTE — Assessment & Plan Note (Signed)
Patient has relapsing polychondritis.  Has been on steroids and methotrexate for years now.  Recently dose was increased of methotrexate 12.5 mg daily.  Was unable to decrease prednisone as requested.  Patient is advised to try to lower prednisone dose from 10 mg alternating 5 mg daily.  Check labs today  Plan Patient Instructions  May remain off CPAP  Healthy sleep regimen   Try to decrease Prednisone '10mg'$  alternating '5mg'$  daily .  Continue on Methotrexate weekly  Labs today .   Follow up with Dr Shearon Stalls in 3 months and As needed

## 2022-03-16 NOTE — Progress Notes (Signed)
_0  ID: Michelle Mack, female    DOB: 1966/03/20, 56 y.o.   MRN: [id number]  Chief Complaint  Patient presents with   Follow-up    Referring provider: [name], MD  HPI: 56 year old female with relapsing polychondritis with pulmonary involvement Initially diagnosed at [facility] with biopsy demonstrating areas of necrosis.  Has been tried on multiple immunosuppressants and prednisone over the years. Has been managed at [facility] sleep and was on methotrexate 12.5 mg weekly and prednisone 10 mg daily   TEST/EVENTS :  High-resolution CT chest October 18, 2017 is negative for ILD, mild air trapping  PFTs December 04, 2021 showed moderate airflow obstruction with FEV1 at 64%, ratio 56, FVC 92%, no significant bronchodilator response, DLCO 85%.  03/16/2022 Follow up ; relapsing polychondritis with pulmonary involvement, history of sleep apnea Patient returns for 56-monthfollow-up.  Patient is recently been seen in the office to reestablish at our pulmonary clinic.  [facility] Dr. RChase Callerseveral years ago. She has been maintained on methotrexate and prednisone for many years.  Last visit patient was recommended to increase methotrexate 2.5 mg 5 tablets weekly.  She is maintained on folic acid.  She was recommended to decrease prednisone from 10 mg to 5 mg.  Patient says she was unable to do this.  She says once she goes down to a lower dose below 10 mg she starts having chest wall pain.  She denies any flare of cough or wheezing. She says she is tolerating the methotrexate dose.   Patient carries a diagnosis of sleep apnea.  Previously was on CPAP.  Patient was set up for a repeat sleep study.  NPSG was done on January 11, 2022 that showed no significant sleep apnea with a AHI at 3.1/hour and SPO2 low at 84%.  Average O2 saturation was 93% and only 2.7 minutes  of test time was below 88%. We discussed her sleep study results in detail..      Immunization History  Administered Date(s) Administered   Hepatitis A 12/27/2007, 01/24/2008, 07/17/2008   Hepatitis B 12/27/2007, 01/24/2008, 07/17/2008   Hepatitis B, PED/ADOLESCENT 12/27/2007, 01/24/2008, 07/17/2008   IPV 12/27/2007   MMR 12/27/2007   Meningococcal Polysaccharide 12/27/2007   PFIZER(Purple Top)SARS-COV-2 Vaccination 09/18/2019, 10/09/2019, 06/17/2020   PPD Test 04/14/2011, 06/08/2013, 01/01/2015, 03/08/2017   Td 12/28/1995   Tdap 12/27/2007, 04/11/2019   Typhoid Inactivated 12/27/2007   Varicella 12/27/2007   Yellow Fever 01/24/2008    Past Medical History:  Diagnosis Date   Anemia    on iron   Back pain    tx with ibuprofen 8049m  Bronchitis    treated recently by abx   Cough    non-productive   DUB (dysfunctional uterine bleeding)    Fibroid uterus    GYN--Dr. Rivard   GERD (gastroesophageal reflux disease)    Hypertension    Infertility, female    OA (osteoarthritis) of knee 04/02/2020   Mod osteoarthritis, medial compartment of L knee[facility] by Dr. BaLayne Benton/1/21   Polychondritis    Recurrent upper respiratory infection (URI)    in October - tx with abx   Type II or unspecified type diabetes mellitus without mention of complication, not stated as uncontrolled    diagnosed by Dr. HiBerdine Addison  Tobacco History: Social History   Tobacco Use  Smoking Status Never   Passive exposure: Never  Smokeless Tobacco Never   Counseling given: Not Answered   Outpatient Medications Prior to Visit  Medication Sig Dispense Refill   amLODipine (NORVASC) 5 MG tablet TAKE 1 TABLET (5 MG TOTAL) BY MOUTH DAILY. 90 tablet 0   atorvastatin (LIPITOR) 20 MG tablet TAKE 1 TABLET BY MOUTH EVERY DAY 90 tablet 1   Cholecalciferol (VITAMIN D3) 50 MCG (2000 UT) capsule TAKE 1 CAPSULE BY MOUTH EVERY DAY 100 capsule 3   ciprofloxacin (CILOXAN)  0.3 % ophthalmic solution Place 1 drop into the right eye every 2 (two) hours. 5 mL 0   folic acid (FOLVITE) 1 MG tablet Take 1 tablet (1 mg total) by mouth daily. 30 tablet 5   metFORMIN (GLUCOPHAGE-XR) 500 MG 24 hr tablet 1,000 mg daily with breakfast.     methotrexate (RHEUMATREX) 2.5 MG tablet Take 5 tablets (12.5 mg total) by mouth once a week. Caution:Chemotherapy. Protect from light. 20 tablet 5   olmesartan-hydrochlorothiazide (BENICAR HCT) 20-12.5 MG tablet TAKE 1 TABLET BY MOUTH EVERY DAY 90 tablet 1   OZEMPIC, 0.25 OR 0.5 MG/DOSE, 2 MG/3ML SOPN Inject into the skin.     predniSONE (DELTASONE) 5 MG tablet Take 2 tablets (10 mg total) by mouth daily with breakfast. 60 tablet 5   solifenacin (VESICARE) 5 MG tablet Take 1 tablet (5 mg total) by mouth daily. 30 tablet 5   Spacer/Aero-Holding Chambers (AEROCHAMBER MV) inhaler Use as instructed 1 each 0   No facility-administered medications prior to visit.     Review of Systems:   Constitutional:   No  weight loss, night sweats,  Fevers, chills,  +fatigue, or  lassitude.  HEENT:   No headaches,  Difficulty swallowing,  Tooth/dental problems, or  Sore throat,                No sneezing, itching, ear ache, nasal congestion, post nasal drip,   CV:  No chest pain,  Orthopnea, PND, swelling in lower extremities, anasarca, dizziness, palpitations, syncope.   GI  No heartburn, indigestion, abdominal pain, nausea, vomiting, diarrhea, change in bowel habits, loss of appetite, bloody stools.   Resp:  No excess mucus, no productive cough,  No non-productive cough,  No coughing up of blood.  No change in color of mucus.  No wheezing.  No chest wall deformity  Skin: no rash or lesions.  GU: no dysuria, change in color of urine, no urgency or frequency.  No flank pain, no hematuria   MS:  No joint pain or swelling.  No decreased range of motion.  No back pain.    Physical Exam  BP 128/72 (BP Location: Left Arm, Patient Position: Sitting,  Cuff Size: Normal)   Pulse 82   Temp 98.5 F (36.9 C) (Oral)   Ht _0  (1.626 m)   Wt 170 lb 6.4 oz (77.3 kg)   LMP 10/09/2015   SpO2 99%   BMI 29.25 kg/m   GEN: A/Ox3; pleasant , NAD, well nourished    HEENT:  [place]/AT,  NOSE-clear, THROAT-clear, no lesions, no postnasal drip or exudate noted.   NECK:  Supple w/ fair ROM; no JVD; normal carotid impulses w/o bruits; no thyromegaly or nodules palpated; no lymphadenopathy.    RESP  Clear  P & A; w/o, wheezes/ rales/ or rhonchi. no accessory muscle use, no dullness to percussion  CARD:  RRR, no m/r/g, no peripheral edema, pulses intact, no cyanosis or clubbing.  GI:  Soft & nt; nml bowel sounds; no organomegaly or masses detected.   Musco: Warm bil, no deformities or joint swelling noted.   Neuro: alert, no focal deficits noted.    Skin: Warm, no lesions or rashes    Lab Results:      BNP No results found for: "BNP" 6   Imaging: No results found.       Latest Ref Rng & Units 12/04/2021   12:05 PM  PFT Results  FVC-Pre L 2.71   FVC-Predicted Pre % 95   FVC-Post L 2.61   FVC-Predicted Post % 92   Pre FEV1/FVC % % 54   Post FEV1/FCV % % 56   FEV1-Pre L 1.45   FEV1-Predicted Pre % 64   FEV1-Post L 1.46   DLCO uncorrected ml/min/mmHg 17.86   DLCO UNC% % 85   DLCO corrected ml/min/mmHg 17.86   DLCO COR %Predicted % 85   DLVA Predicted % 107   TLC L 4.69   TLC % Predicted % 92   RV % Predicted % 105     Lab Results  Component Value Date   NITRICOXIDE 17 10/06/2017        Assessment & Plan:   OSA (obstructive sleep apnea) Repeat sleep study shows no significant sleep apnea or nocturnal hypoxemia.  Patient may remain off of CPAP.  Patient is encouraged on healthy sleep regimen.  Plan  Patient Instructions  May remain off CPAP  Healthy sleep regimen   Try to decrease Prednisone 82m alternating 562mdaily .  Continue on Methotrexate weekly  Labs today .   Follow up with Dr DeShearon Stallsn 3 months  and As needed      Relapsing polychondritis Patient has relapsing polychondritis.  Has been on steroids and methotrexate for years now.  Recently dose was increased of methotrexate 12.5 mg daily.  Was unable to decrease prednisone as requested.  Patient is advised to try to lower prednisone dose from 10 mg alternating 5 mg daily.  Check labs today  Plan Patient Instructions  May remain off CPAP  Healthy sleep regimen   Try to decrease Prednisone 1042mlternating 5mg75mily .  Continue on Methotrexate weekly  Labs today .   Follow up with Dr DesaShearon Stalls3 months and As needed        TammRexene Edison 03/16/2022

## 2022-03-16 NOTE — Patient Instructions (Addendum)
May remain off CPAP  Healthy sleep regimen   Try to decrease Prednisone '10mg'$  alternating '5mg'$  daily .  Continue on Methotrexate weekly  Labs today .   Follow up with Dr Shearon Stalls in 3 months and As needed

## 2022-03-17 ENCOUNTER — Encounter: Payer: Self-pay | Admitting: Family Medicine

## 2022-03-17 ENCOUNTER — Ambulatory Visit: Payer: BC Managed Care – PPO | Admitting: Obstetrics and Gynecology

## 2022-03-17 ENCOUNTER — Ambulatory Visit: Payer: BC Managed Care – PPO | Admitting: Family Medicine

## 2022-03-17 VITALS — BP 102/68 | HR 72 | Temp 98.6°F | Ht 64.0 in | Wt 170.2 lb

## 2022-03-17 DIAGNOSIS — H9202 Otalgia, left ear: Secondary | ICD-10-CM

## 2022-03-17 DIAGNOSIS — J029 Acute pharyngitis, unspecified: Secondary | ICD-10-CM | POA: Diagnosis not present

## 2022-03-17 LAB — COMPREHENSIVE METABOLIC PANEL
ALT: 18 U/L (ref 0–35)
AST: 17 U/L (ref 0–37)
Albumin: 4.1 g/dL (ref 3.5–5.2)
Alkaline Phosphatase: 73 U/L (ref 39–117)
BUN: 23 mg/dL (ref 6–23)
CO2: 29 mEq/L (ref 19–32)
Calcium: 9.7 mg/dL (ref 8.4–10.5)
Chloride: 101 mEq/L (ref 96–112)
Creatinine, Ser: 1.13 mg/dL (ref 0.40–1.20)
GFR: 55.26 mL/min — ABNORMAL LOW (ref >60.00)
Glucose, Bld: 156 mg/dL — ABNORMAL HIGH (ref 70–99)
Potassium: 4 mEq/L (ref 3.5–5.1)
Sodium: 140 mEq/L (ref 135–145)
Total Bilirubin: 0.5 mg/dL (ref 0.2–1.2)
Total Protein: 7.5 g/dL (ref 6.0–8.3)

## 2022-03-17 LAB — CBC WITH DIFFERENTIAL/PLATELET
Basophils Absolute: 0.1 10*3/uL (ref 0.0–0.1)
Basophils Relative: 0.5 % (ref 0.0–3.0)
Eosinophils Absolute: 0 10*3/uL (ref 0.0–0.7)
Eosinophils Relative: 0.3 % (ref 0.0–5.0)
HCT: 36 % (ref 36.0–46.0)
Hemoglobin: 11.5 g/dL — ABNORMAL LOW (ref 12.0–15.0)
Lymphocytes Relative: 11.4 % — ABNORMAL LOW (ref 12.0–46.0)
Lymphs Abs: 1.3 10*3/uL (ref 0.7–4.0)
MCHC: 32 g/dL (ref 30.0–36.0)
MCV: 91.2 fl (ref 78.0–100.0)
Monocytes Absolute: 0.7 10*3/uL (ref 0.1–1.0)
Monocytes Relative: 5.9 % (ref 3.0–12.0)
Neutro Abs: 9.7 10*3/uL — ABNORMAL HIGH (ref 1.4–7.7)
Neutrophils Relative %: 81.9 % — ABNORMAL HIGH (ref 43.0–77.0)
Platelets: 371 10*3/uL (ref 150.0–400.0)
RBC: 3.95 Mil/uL (ref 3.87–5.11)
RDW: 15 % (ref 11.5–15.5)
WBC: 11.9 10*3/uL — ABNORMAL HIGH (ref 4.0–10.5)

## 2022-03-17 NOTE — Progress Notes (Signed)
Chief Complaint  Patient presents with   Ear Pain    Left ear pain, started Monday when her throat started hurting. Took home covid tests-Mon and today and were both negative. Using saline nasal spray, alka seltzer, TheraFlu, gargling salt water and using cough drops. Symptoms are worse at night.   2 days ago she started with a sore throat.  L ear started hurting yesterday. Denies runny nose, PND, cough. Throat hurts worse at night. Denies sinus pain. Has a headache today, thinks bc she hasn't eaten (across the top of head, diffusely).  Denies plugging/clicking/popping of the ear, hearing is normal, no tinnitus. Denies jaw pain, popping.  Breathing is goood, at baseline.  Denies wheezing, shortness of breath.  No sick contacts at home or work.  She uses flonase daily, and saline spray BID. She uses Theraflu and salt water gargles, some short-lived relief. Hasn't taken anything for pain.  PMH, PSH, SH reviewed  Outpatient Encounter Medications as of 03/17/2022  Medication Sig Note   amLODipine (NORVASC) 5 MG tablet TAKE 1 TABLET (5 MG TOTAL) BY MOUTH DAILY.    atorvastatin (LIPITOR) 20 MG tablet TAKE 1 TABLET BY MOUTH EVERY DAY    Chlorphen-Pseudoephed-APAP (THERAFLU COLD/SORE THROAT PO) Take 1 packet by mouth as needed. 03/17/2022: Last dose last night   Cholecalciferol (VITAMIN D3) 50 MCG (2000 UT) capsule TAKE 1 CAPSULE BY MOUTH EVERY DAY    fluticasone (FLONASE) 50 MCG/ACT nasal spray Place 2 sprays into both nostrils daily.    folic acid (FOLVITE) 1 MG tablet Take 1 tablet (1 mg total) by mouth daily.    metFORMIN (GLUCOPHAGE-XR) 500 MG 24 hr tablet 1,000 mg daily with breakfast. 10/01/2021: Taking 1000 qam and 1000 qpm   methotrexate (RHEUMATREX) 2.5 MG tablet Take 5 tablets (12.5 mg total) by mouth once a week. Caution:Chemotherapy. Protect from light.    olmesartan-hydrochlorothiazide (BENICAR HCT) 20-12.5 MG tablet TAKE 1 TABLET BY MOUTH EVERY DAY    OZEMPIC, 0.25 OR 0.5  MG/DOSE, 2 MG/3ML SOPN Inject into the skin. 03/17/2022: On 0.25, increasing to 0.5 next week.   Phenyleph-Doxylamine-DM-APAP (ALKA SELTZER PLUS PO) Take 2 each by mouth as needed. 03/17/2022: Cold/cough/sore throat-last dose last night     predniSONE (DELTASONE) 5 MG tablet Take 2 tablets (10 mg total) by mouth daily with breakfast.    Sodium Chloride ([facility] SALINE SPRAY IN) Inhale 1 spray into the lungs as needed.    Spacer/Aero-Holding Chambers (AEROCHAMBER MV) inhaler Use as instructed    solifenacin (VESICARE) 5 MG tablet Take 1 tablet (5 mg total) by mouth daily. (Patient not taking: Reported on 03/17/2022)    [DISCONTINUED] ciprofloxacin (CILOXAN) 0.3 % ophthalmic solution Place 1 drop into the right eye every 2 (two) hours.    No facility-administered encounter medications on file as of 03/17/2022.   Allergies reviewed  ROS:  ear pain and sore throat per HPI.  HA per HPI. No sinus pain.   No fever/chills. No n/v/d No rashes No chest pain No cough, shortness of breath.   PHYSICAL EXAM:  BP 102/68   Pulse 72   Temp 98.6 F (37 C) (Tympanic)   Ht '5\' 4"'$  (1.626 m)   Wt 170 lb 3.2 oz (77.2 kg)   LMP 10/09/2015   BMI 29.21 kg/m   Pleasant female in no distress Some throat-clearing during visit. No coughing. HEENT: conjunctiva and sclera are clear, EOMI L TM and EAC are normal. Very slight fluid and central redness on the R TM, no  bulging. EAC is normal. Nontender at [facility] and sinuses Nasal mucosa is mild-mod edematous with some yellow drainage/crusting noted R>L. OP with slight cobblestoning posteriorly, and ?of very small ulceration just to the right of midline of posterior OP Neck: no lymphadenopathy or mass Heart: regular rate and rhythm Lungs: clear bilaterally. Neuro: alert and oriented, cranial nerves grossly intact, normal gait. Psych: normal mood, affect, hygiene and grooming    ASSESSMENT/PLAN:  Acute otalgia, left - normal exam, suspect ETD and early viral  URI.  Reviewed s/sx bacterial infection, f/u if persistent/worsening pain  Sore throat - suspect related to PND, likely viral (+/- allergies). Very small/shallow ulcer noted in posterior OP, none elsewhere in mouth. Supportive measures   Declined flu shot today.  I think your symptoms are related to the start of a viral illness. Continue to use symptomatic management--use tylenol if needed for pain (but don't use along with the Theraflu which also contains acetaminophen) Continue salt water gargles,  Consider chloraseptic spray for sore throat. Consider using mucinex (guaifenesin) since the mucus appears thick. I recommend sinus rinses twice a day  Contact us if you develop fever, sinus pain that doesn't resolve within a week, or other new concerns.

## 2022-03-17 NOTE — Patient Instructions (Signed)
I think your symptoms are related to the start of a viral illness. Continue to use symptomatic management--use tylenol if needed for pain (but don't use along with the Theraflu which also contains acetaminophen) Continue salt water gargles,  Consider chloraseptic spray for sore throat. Consider using mucinex (guaifenesin) since the mucus appears thick. I recommend sinus rinses twice a day  Contact us if you develop fever, sinus pain that doesn't resolve within a week, or other new concerns.  I encourage you to get your flu shot soon, and the updated COVID booster once it is available (later this Fall).

## 2022-03-22 ENCOUNTER — Encounter: Payer: Self-pay | Admitting: Podiatry

## 2022-03-25 ENCOUNTER — Telehealth: Payer: Self-pay | Admitting: Licensed Clinical Social Worker

## 2022-03-25 NOTE — Patient Instructions (Signed)
Visit Information  Thank you for taking time to visit with me today. Please don't hesitate to contact me if I can be of assistance to you.   Following are the goals we discussed today:   Goals Addressed             This Visit's Progress    COMPLETED: Care Coordination Activities-No Follow Up       Care Coordination Interventions: Active listening / Reflection utilized  Emotional Support Provided LCSW informed patient of care coordination services. Pt is not interested at this time and agreed to contact PCP, should needs arise LCSW informed pt of flu shot availability. Pt agreed to contact PCP office, if interested         If you are experiencing a Mental Health or [place] or need someone to talk to, please call the Suicide and Crisis Lifeline: 988 call 911   Patient verbalizes understanding of instructions and care plan provided today and agrees to view in [place]. Active MyChart status and patient understanding of how to access instructions and care plan via MyChart confirmed with patient.     No further follow up required:    Christa See, MSW, [email or web address] Phone [phone] 5:06 PM

## 2022-03-25 NOTE — Patient Outreach (Signed)
Care Coordination   Initial Visit Note   03/25/2022 Name: Michelle Mack MRN: [id number] DOB: 09/05/1965  Michelle Mack is a 56 y.o. year old female who sees Michelle Arrow, MD for primary care. I spoke with  Michelle Mack by phone today.  What matters to the patients health and wellness today?  Care Coordination    Goals Addressed             This Visit's Progress    COMPLETED: Care Coordination Activities-No Follow Up       Care Coordination Interventions: Active listening / Reflection utilized  Emotional Support Provided LCSW informed patient of care coordination services. Pt is not interested at this time and agreed to contact PCP, should needs arise LCSW informed pt of flu shot availability. Pt agreed to contact PCP office, if interested         SDOH assessments and interventions completed:  No     Care Coordination Interventions Activated:  No  Care Coordination Interventions:  No, not indicated   Follow up plan: No further intervention required.   Encounter Outcome:  Pt. Refused   Jenel Lucks, MSW, LCSW [facility] [place]  [facility] [email or web address] Phone [phone] 5:05 PM

## 2022-03-31 DIAGNOSIS — R768 Other specified abnormal immunological findings in serum: Secondary | ICD-10-CM | POA: Diagnosis not present

## 2022-03-31 DIAGNOSIS — N182 Chronic kidney disease, stage 2 (mild): Secondary | ICD-10-CM | POA: Diagnosis not present

## 2022-03-31 DIAGNOSIS — I10 Essential (primary) hypertension: Secondary | ICD-10-CM | POA: Diagnosis not present

## 2022-03-31 DIAGNOSIS — E559 Vitamin D deficiency, unspecified: Secondary | ICD-10-CM | POA: Diagnosis not present

## 2022-03-31 LAB — MICROALBUMIN / CREATININE URINE RATIO: Microalb Creat Ratio: 47

## 2022-03-31 LAB — PROTEIN / CREATININE RATIO, URINE: Creatinine, Urine: 161

## 2022-04-07 ENCOUNTER — Encounter: Payer: Self-pay | Admitting: Podiatry

## 2022-04-07 ENCOUNTER — Encounter: Payer: Self-pay | Admitting: *Deleted

## 2022-04-08 ENCOUNTER — Encounter: Payer: Self-pay | Admitting: Podiatry

## 2022-04-13 ENCOUNTER — Encounter: Payer: Self-pay | Admitting: Internal Medicine

## 2022-04-14 ENCOUNTER — Other Ambulatory Visit: Payer: Self-pay | Admitting: Family Medicine

## 2022-04-14 DIAGNOSIS — I10 Essential (primary) hypertension: Secondary | ICD-10-CM

## 2022-04-14 NOTE — Telephone Encounter (Signed)
I do not see where she needs an appointment, can you verify?

## 2022-04-14 NOTE — Telephone Encounter (Signed)
Pt requesting a refill on her amlodopine. She stated the pharmacy told her she would need an appointment?

## 2022-04-15 ENCOUNTER — Telehealth: Payer: Self-pay | Admitting: Internal Medicine

## 2022-04-15 DIAGNOSIS — Z7952 Long term (current) use of systemic steroids: Secondary | ICD-10-CM | POA: Diagnosis not present

## 2022-04-15 DIAGNOSIS — M941 Relapsing polychondritis: Secondary | ICD-10-CM | POA: Diagnosis not present

## 2022-04-15 DIAGNOSIS — M79642 Pain in left hand: Secondary | ICD-10-CM | POA: Diagnosis not present

## 2022-04-15 DIAGNOSIS — R5383 Other fatigue: Secondary | ICD-10-CM | POA: Diagnosis not present

## 2022-04-15 DIAGNOSIS — M1991 Primary osteoarthritis, unspecified site: Secondary | ICD-10-CM | POA: Diagnosis not present

## 2022-04-15 DIAGNOSIS — M79641 Pain in right hand: Secondary | ICD-10-CM | POA: Diagnosis not present

## 2022-04-15 DIAGNOSIS — M545 Low back pain, unspecified: Secondary | ICD-10-CM | POA: Diagnosis not present

## 2022-04-15 MED ORDER — METHOTREXATE SODIUM 5 MG PO TABS: 25.0000 mg | ORAL_TABLET | ORAL | 5 refills | Status: AC

## 2022-04-15 NOTE — Telephone Encounter (Signed)
Patient is currently on 12.5 methotrexate.  I have reviewed her records from rheumatology and I would like her to increase mtx to 25 mg daily. She can start by taking 3 tabs of 5 mg per week x1 week, then increase to 4 tabs the next week, and finally 5 tabs (25 mg total.) follow up with me in Nov as scheduled.

## 2022-04-16 NOTE — Telephone Encounter (Signed)
See My Chart encounter from 04/16/22.

## 2022-04-16 NOTE — Telephone Encounter (Signed)
Pt requesting Methotrexate  instructions be sent to her via My Chart which was done. Pt would like to know why Methotrexate increased. Dr. Shearon Stalls can you please advise? Thank you

## 2022-04-20 ENCOUNTER — Other Ambulatory Visit: Payer: Self-pay | Admitting: Internal Medicine

## 2022-04-20 ENCOUNTER — Ambulatory Visit: Payer: BC Managed Care – PPO | Admitting: Obstetrics and Gynecology

## 2022-04-20 ENCOUNTER — Encounter: Payer: Self-pay | Admitting: *Deleted

## 2022-04-26 ENCOUNTER — Encounter: Payer: Self-pay | Admitting: *Deleted

## 2022-04-27 DIAGNOSIS — H25813 Combined forms of age-related cataract, bilateral: Secondary | ICD-10-CM | POA: Diagnosis not present

## 2022-04-27 DIAGNOSIS — E119 Type 2 diabetes mellitus without complications: Secondary | ICD-10-CM | POA: Diagnosis not present

## 2022-04-27 DIAGNOSIS — H0520 Unspecified exophthalmos: Secondary | ICD-10-CM | POA: Diagnosis not present

## 2022-04-27 LAB — HM DIABETES EYE EXAM

## 2022-04-27 NOTE — Telephone Encounter (Signed)
Updated pt with Dr. Mauricio Po response via My Chart. Nothing further needed at this time.

## 2022-05-04 ENCOUNTER — Encounter: Payer: Self-pay | Admitting: *Deleted

## 2022-05-06 ENCOUNTER — Ambulatory Visit: Payer: BC Managed Care – PPO | Admitting: Family Medicine

## 2022-05-06 DIAGNOSIS — M79641 Pain in right hand: Secondary | ICD-10-CM | POA: Diagnosis not present

## 2022-05-06 DIAGNOSIS — M79642 Pain in left hand: Secondary | ICD-10-CM | POA: Diagnosis not present

## 2022-05-06 DIAGNOSIS — Z7952 Long term (current) use of systemic steroids: Secondary | ICD-10-CM | POA: Diagnosis not present

## 2022-05-06 DIAGNOSIS — M941 Relapsing polychondritis: Secondary | ICD-10-CM | POA: Diagnosis not present

## 2022-05-24 DIAGNOSIS — Z79899 Other long term (current) drug therapy: Secondary | ICD-10-CM | POA: Diagnosis not present

## 2022-05-24 DIAGNOSIS — M0609 Rheumatoid arthritis without rheumatoid factor, multiple sites: Secondary | ICD-10-CM | POA: Diagnosis not present

## 2022-05-24 DIAGNOSIS — M941 Relapsing polychondritis: Secondary | ICD-10-CM | POA: Diagnosis not present

## 2022-05-25 ENCOUNTER — Ambulatory Visit (INDEPENDENT_AMBULATORY_CARE_PROVIDER_SITE_OTHER): Payer: BC Managed Care – PPO | Admitting: Internal Medicine

## 2022-05-25 ENCOUNTER — Encounter: Payer: Self-pay | Admitting: Internal Medicine

## 2022-05-25 VITALS — BP 110/68 | HR 89 | Temp 98.7°F | Ht 64.0 in | Wt 168.0 lb

## 2022-05-25 DIAGNOSIS — M941 Relapsing polychondritis: Secondary | ICD-10-CM | POA: Diagnosis not present

## 2022-05-25 MED ORDER — FOLIC ACID 1 MG PO TABS: 1.0000 mg | ORAL_TABLET | Freq: Every day | ORAL | 1 refills | Status: DC

## 2022-05-25 NOTE — Patient Instructions (Addendum)
Please schedule follow up scheduled with myself in 6 months.  If my schedule is not open yet, we will contact you with a reminder closer to that time. Please call [phone] if you haven't heard from [place] a month before.   Continue MTX 20 mg weekly with folic acid.  Continue following with Dr. Trudie Reed on [facility] rheumatology.  Continue working with [facility] to come down on your prednisone as tolerated.

## 2022-05-25 NOTE — Progress Notes (Signed)
Michelle Mack    [id number]    [birth year]  Primary Care Physician:Knapp, Eve, MD Date of Appointment: 05/25/2022 Established Patient Visit  Chief complaint:   Chief Complaint  Patient presents with   Follow-up    Chest pains occ      HPI: Michelle Mack is a 56 y.o. woman with relapsing polychondritis with pulmonary involvement.   Interval Updates: Here for follow up.  Denies dyspnea, wheezing.   On methotrexate 20 mg weekly (8 pills) and prednisone 10 mg daily. Tried to go down to 5 mg daily but had MSK chest pain so went back up to 10 mg daily.   Split night study showed no OSA - did not need titration.   Sees Dr. Trudie Reed at [facility]. Just had labs with their office yesterday.   I have reviewed the patient's family social and past medical history and updated as appropriate.   Past Medical History:  Diagnosis Date   Anemia    on iron   Back pain    tx with ibuprofen 858m   Bronchitis    treated recently by abx   Cough    non-productive   DUB (dysfunctional uterine bleeding)    Fibroid uterus    GYN--Dr. Rivard   GERD (gastroesophageal reflux disease)    Hypertension    Infertility, female    OA (osteoarthritis) of knee 04/02/2020   Mod [facility]  Cortisone injection by Dr. BLayne Benton9/1/21   Polychondritis    Recurrent upper respiratory infection (URI)    in October - tx with abx   Type II or unspecified type diabetes mellitus without mention of complication, not stated as uncontrolled    diagnosed by Dr. HBerdine Addison   Past Surgical History:  Procedure Laterality Date   [facility]  [facility] in L mainstem bronchus and LUL   ENDOMETRIAL VAPORIZATION W/ VERSAPOINT     LEFT HEART CATH AND CORONARY ANGIOGRAPHY N/A 07/19/2018   Procedure: LEFT HEART CATH AND CORONARY ANGIOGRAPHY;  Surgeon:  PNigel Mormon MD;  [facility];  Service: Cardiovascular;  Laterality: N/A;   MYOMECTOMY  07/05/2000   SUPRACERVICAL ABDOMINAL HYSTERECTOMY     still has ovaries    Family History  Problem Relation Age of Onset   Hypertension Mother    Diabetes Mother    Hypertension Father    Diabetes Father    Cancer Father        prostate cancer--metastasized 2021   Hypertension Sister    Thyroid nodules Sister    Diabetes Maternal Grandmother    Breast cancer Maternal Grandmother    Heart disease Maternal Grandmother    Heart disease Paternal Grandmother     Social History   Occupational History   Occupation: CTherapist, artat [employer]  Tobacco Use   Smoking status: Never    Passive exposure: Never   Smokeless tobacco: Never  Vaping Use   Vaping Use: Never used  Substance and Sexual Activity   Alcohol use: Yes    Comment: 0-1 drink/week   Drug use: No   Sexual activity: Not Currently    Birth control/protection: None     Physical Exam: Blood pressure 110/68, pulse 89, temperature 98.7 F (37.1 C), temperature source Oral, height _0  (1.626 m), weight 168 lb (76.2  kg), last menstrual period 10/09/2015, SpO2 100 %.  Gen:      No acute distress ENT:  mmm no polyps Lungs:    CTAB no wheezes or crackles CV:         Regular rate and rhythm; no murmurs, rubs, or gallops.  No pedal edema   Data Reviewed: Imaging: I have personally reviewed the chest xray Nov 2022 - no acute process Previously reviewed CT Chest reports in care everywhere document tracheomalacia.   PFTs:     Latest Ref Rng & Units 12/04/2021   12:05 PM  PFT Results  FVC-Pre L 2.71   FVC-Predicted Pre % 95   FVC-Post L 2.61   FVC-Predicted Post % 92   Pre FEV1/FVC % % 54   Post FEV1/FCV % % 56   FEV1-Pre L 1.45   FEV1-Predicted Pre % 64   FEV1-Post L 1.46   DLCO uncorrected ml/min/mmHg 17.86   DLCO UNC% % 85   DLCO corrected ml/min/mmHg 17.86    DLCO COR %Predicted % 85   DLVA Predicted % 107   TLC L 4.69   TLC % Predicted % 92   RV % Predicted % 105    I have personally reviewed the patient's PFTs today which shows moderate airflow limitation FEV1 64% of predicted. No bronchodilator response, normal lung volumes and diffusion capacity.  I have personally reviewed the patient's PFTs and in Jan 2021 showed moderate airflow limitation FEV1 68% of predicted.    Labs: Lab Results  Component Value Date   WBC 11.9 (H) 03/16/2022   HGB 11.5 (L) 03/16/2022   HCT 36.0 03/16/2022   MCV 91.2 03/16/2022   PLT 371.0 03/16/2022   Lab Results  Component Value Date   NA 140 03/16/2022   K 4.0 03/16/2022   CL 101 03/16/2022   CO2 29 03/16/2022     Immunization status: Immunization History  Administered Date(s) Administered   Hepatitis A 12/27/2007, 01/24/2008, 07/17/2008   Hepatitis B 12/27/2007, 01/24/2008, 07/17/2008   Hepatitis B, PED/ADOLESCENT 12/27/2007, 01/24/2008, 07/17/2008   IPV 12/27/2007   MMR 12/27/2007   Meningococcal Polysaccharide 12/27/2007   PFIZER(Purple Top)SARS-COV-2 Vaccination 09/18/2019, 10/09/2019, 06/17/2020   PPD Test 04/14/2011, 06/08/2013, 01/01/2015, 03/08/2017   Td 12/28/1995   Tdap 12/27/2007, 04/11/2019   Typhoid Inactivated 12/27/2007   Varicella 12/27/2007   Yellow Fever 01/24/2008    External Records Personally Reviewed: PCP  Assessment:  Relapsing Polychondritis - stable Tracheomalacia  Plan/Recommendations: Continue MTX 20 mg weekly with folic acid.  Continue following with Dr. Trudie Reed on [facility] rheumatology.  Continue working with [facility].    Return to Care: Return in about 6 months (around 11/23/2022).   Lenice Llamas, MD Pulmonary and [place]

## 2022-06-20 ENCOUNTER — Other Ambulatory Visit: Payer: Self-pay | Admitting: Internal Medicine

## 2022-06-20 DIAGNOSIS — M941 Relapsing polychondritis: Secondary | ICD-10-CM

## 2022-06-22 ENCOUNTER — Telehealth (INDEPENDENT_AMBULATORY_CARE_PROVIDER_SITE_OTHER): Payer: BC Managed Care – PPO | Admitting: Nurse Practitioner

## 2022-06-22 ENCOUNTER — Encounter: Payer: Self-pay | Admitting: Nurse Practitioner

## 2022-06-22 VITALS — Ht 64.0 in | Wt 168.0 lb

## 2022-06-22 DIAGNOSIS — J029 Acute pharyngitis, unspecified: Secondary | ICD-10-CM | POA: Diagnosis not present

## 2022-06-22 MED ORDER — AZITHROMYCIN 250 MG PO TABS: ORAL_TABLET | ORAL | 0 refills | Status: AC

## 2022-06-22 MED ORDER — FLUCONAZOLE 150 MG PO TABS: ORAL_TABLET | ORAL | 2 refills | Status: DC

## 2022-06-22 NOTE — Patient Instructions (Signed)
I recommend alternating Tylenol and Ibuprofen to help with the pain. If you develop a fever this will help keep this under better control, as well. Try warm salt water gargles to help soothe the throat and be sure you are staying good and hydrated to keep the throat moist. Lozenges with benzocaine can be helpful to soothe the sore throat, as well. These are available over the counter at the pharmacy.   You can try the above methods first and see if these are helpful before starting the antibiotic, but I want to make sure you have something available in the event things worsen or don't improve over the Christmas holiday.   Please let me know if you have any questions.

## 2022-06-22 NOTE — Progress Notes (Signed)
Virtual Visit Encounter mychart visit.   I connected with  [name] on 06/22/22 at 10:15 AM EST by secure video and audio telemedicine application. I verified that I am speaking with the correct person using two identifiers.   I introduced myself as a Designer, jewellery with the practice. The limitations of evaluation and management by telemedicine discussed with the patient and the availability of in person appointments. The patient expressed verbal understanding and consent to proceed.  Participating parties in this visit include: Myself and patient  The patient is: Patient Location: Home I am: Provider Location: Office/Clinic Subjective:    CC and HPI: Michelle Mack is a 56 y.o. year old female presenting for new evaluation and treatment of sore throat. Patient reports the following: Cay endorses onset of severe sore throat starting yesterday with itching in her left ear. She tells me that it is very difficult to swallow or eat due to the sore throat. She does not have congestion, cough, chills, body aches, or fatigue at this time. She has not had sick contacts that she is aware of.    Past medical history, Surgical history, Family history not pertinant except as noted below, Social history, Allergies, and medications have been entered into the medical record, reviewed, and corrections made.   Review of Systems:  All review of systems negative except what is listed in the HPI  Objective:    Alert and oriented x 4 Posterior oropharynx erythematous with exudate noted on the tonsils.  Speaking in clear sentences with no shortness of breath. No distress.  Impression and Recommendations:    Problem List Items Addressed This Visit     Acute sore throat - Primary    Erythematous posterior oropharynx with exudate present in the setting of severe sore throat with sudden onset. Suspect presents of strep pharyngitis given the symptoms. Discussed with patient the option to  come by for strep swab, but she is unable to do this at this time. Recommendations for supportive care provided on AVS for sore throat management. Will go ahead and send antibiotic therapy for suspected strep infection with option to delay start to see if symptoms change over the next few days. Given the upcoming holiday weekend, I do not want her to be without treatment option available. Recommend f/u if no improvement of her symptoms o if new symptoms develop.       Relevant Medications   azithromycin (ZITHROMAX) 250 MG tablet   fluconazole (DIFLUCAN) 150 MG tablet    orders and follow up as documented in EMR I discussed the assessment and treatment plan with the patient. The patient was provided an opportunity to ask questions and all were answered. The patient agreed with the plan and demonstrated an understanding of the instructions.   The patient was advised to call back or seek an in-person evaluation if the symptoms worsen or if the condition fails to improve as anticipated.  Follow-Up: prn  I provided 16 minutes of non-face-to-face interaction with this non face-to-face encounter including intake, same-day documentation, and chart review.   Orma Render, NP , DNP, AGNP-c [place] [facility] [phone] (fax)

## 2022-06-22 NOTE — Assessment & Plan Note (Signed)
Erythematous posterior oropharynx with exudate present in the setting of severe sore throat with sudden onset. Suspect presents of strep pharyngitis given the symptoms. Discussed with patient the option to come by for strep swab, but she is unable to do this at this time. Recommendations for supportive care provided on AVS for sore throat management. Will go ahead and send antibiotic therapy for suspected strep infection with option to delay start to see if symptoms change over the next few days. Given the upcoming holiday weekend, I do not want her to be without treatment option available. Recommend f/u if no improvement of her symptoms o if new symptoms develop.

## 2022-07-06 DIAGNOSIS — Z79899 Other long term (current) drug therapy: Secondary | ICD-10-CM | POA: Diagnosis not present

## 2022-07-06 DIAGNOSIS — M941 Relapsing polychondritis: Secondary | ICD-10-CM | POA: Diagnosis not present

## 2022-07-06 DIAGNOSIS — M0609 Rheumatoid arthritis without rheumatoid factor, multiple sites: Secondary | ICD-10-CM | POA: Diagnosis not present

## 2022-07-06 DIAGNOSIS — M1991 Primary osteoarthritis, unspecified site: Secondary | ICD-10-CM | POA: Diagnosis not present

## 2022-07-14 ENCOUNTER — Ambulatory Visit: Payer: BC Managed Care – PPO | Admitting: Family Medicine

## 2022-07-19 ENCOUNTER — Other Ambulatory Visit: Payer: Self-pay | Admitting: Family Medicine

## 2022-07-19 ENCOUNTER — Other Ambulatory Visit: Payer: Self-pay | Admitting: Internal Medicine

## 2022-07-19 DIAGNOSIS — E7800 Pure hypercholesterolemia, unspecified: Secondary | ICD-10-CM

## 2022-07-19 DIAGNOSIS — M941 Relapsing polychondritis: Secondary | ICD-10-CM

## 2022-07-19 NOTE — Telephone Encounter (Signed)
Is this okay to refill? Last lipids were 09/08/21 (outside source) and she has CPE in May.

## 2022-07-20 ENCOUNTER — Encounter: Payer: Self-pay | Admitting: Primary Care

## 2022-07-20 ENCOUNTER — Ambulatory Visit: Payer: BC Managed Care – PPO | Admitting: Primary Care

## 2022-07-20 VITALS — BP 104/60 | HR 94 | Ht 64.0 in | Wt 169.0 lb

## 2022-07-20 DIAGNOSIS — R0602 Shortness of breath: Secondary | ICD-10-CM

## 2022-07-20 DIAGNOSIS — M941 Relapsing polychondritis: Secondary | ICD-10-CM | POA: Diagnosis not present

## 2022-07-20 NOTE — Assessment & Plan Note (Addendum)
Hx polycondritis on MXT. Following with Dr. Trudie Reed with rheumatology. Methorexate was recently increased from 5 tablets ('25mg'$ ) to 10 tablets ('50mg'$ ) in attempts to taper off prednisone. Remains on '10mg'$  prednisone daily. Reports increased chest tightness/pain and dyspnea at time of medication change 3 weeks ago. PFTs in June 2023 showed moderate restriction. CT imaging in 2019 showed no evidence of ILD, air trapping. Ordered repeat HRCT imaging to reassess lung parenchyma for interstitial lung disease relate to medication use. RX albuterol hfa 2 puff q 4-6 hours to use prn sob/chest tightness. Advised she follow up with rheumatology, they recently did labs. I will also reach out to Dr. Shearon Stalls to up date her.

## 2022-07-20 NOTE — Progress Notes (Signed)
$'@Patient'G$  ID: Michelle Mack, female    DOB: 03/08/1966, 57 y.o.   MRN: [id number]  Chief Complaint  Patient presents with   Acute Visit    Coughing  Chest tightness for about 3 weeks     Referring provider: [name], MD  HPI: Michelle Mack is a 57 y.o. woman with relapsing polychondritis with pulmonary involvement.   05/25/22- Dr. Shearon Stalls Here for follow up.  Denies dyspnea, wheezing.   On methotrexate 20 mg weekly (8 pills) and prednisone 10 mg daily. Tried to go down to 5 mg daily but had MSK chest pain so went back up to 10 mg daily.   Split night study showed no OSA - did not need titration.   Sees Dr. Trudie Reed at [place]. Just had labs with their office yesterday.   I have reviewed the patient's family social and past medical history and updated as appropriate.    07/20/2022- interim hx  Patient presents today for acute visit. PMH significant for HTN, bronchitis, left lung collapse, OSA, tracheal stenosis, GERD, polychondritis   She is on methotrexate '20mg'$  weekly and prednisone '10mg'$  daily Tried to taper prednisone but she had muscular skeletal chest pain  Follows with Dr. Trudie Reed with rheumatology  Repeat sleep study showed no significant sleep apnea or nocturnal hypoxemia  Increased methotrexate to '50mg'$  weekly approx 3 weeks ago in attempts to taper off prednisone. She is on '10mg'$  prednisone daily. She is still taking folic acid.   She has a dry intermittent cough. Associated chest tightness/heaviness and wheezing with exertion. Symptoms are worse since increased dose of MXT. She had recent lab work with rheumatology which are not in [place].    Immunization History  Administered Date(s) Administered   Hepatitis A 12/27/2007, 01/24/2008, 07/17/2008   Hepatitis B 12/27/2007, 01/24/2008, 07/17/2008   Hepatitis B, PED/ADOLESCENT 12/27/2007, 01/24/2008, 07/17/2008   IPV 12/27/2007   MMR 12/27/2007   Meningococcal polysaccharide vaccine (MPSV4)  12/27/2007   PFIZER(Purple Top)SARS-COV-2 Vaccination 09/18/2019, 10/09/2019, 06/17/2020   PPD Test 04/14/2011, 06/08/2013, 01/01/2015, 03/08/2017   Td 12/28/1995   Tdap 12/27/2007, 04/11/2019   Typhoid Inactivated 12/27/2007   Varicella 12/27/2007   Yellow Fever 01/24/2008    Past Medical History:  Diagnosis Date   Anemia    on iron   Back pain    tx with ibuprofen '800mg'$    Bronchitis    treated recently by abx   Cough    non-productive   DUB (dysfunctional uterine bleeding)    Fibroid uterus    GYN--Dr. Rivard   GERD (gastroesophageal reflux disease)    Hypertension    Infertility, female    OA (osteoarthritis) of knee 04/02/2020   Mod osteoarthritis, medial compartment of L knee, per x-rays at [facility].  Cortisone injection by Dr. Layne Benton 03/05/20   Polychondritis    Recurrent upper respiratory infection (URI)    in October - tx with abx   Type II or unspecified type diabetes mellitus without mention of complication, not stated as uncontrolled    diagnosed by Dr. Berdine Addison    Tobacco History: Social History   Tobacco Use  Smoking Status Never   Passive exposure: Never  Smokeless Tobacco Never   Counseling given: Not Answered   Outpatient Medications Prior to Visit  Medication Sig Dispense Refill   amLODipine (NORVASC) 5 MG tablet TAKE 1 TABLET (5 MG TOTAL) BY MOUTH DAILY. 90 tablet 2   atorvastatin (LIPITOR) 20 MG tablet TAKE 1 TABLET BY MOUTH EVERY DAY 90  tablet 0   Chlorphen-Pseudoephed-APAP (THERAFLU COLD/SORE THROAT PO) Take 1 packet by mouth as needed.     Cholecalciferol (VITAMIN D3) 50 MCG (2000 UT) capsule TAKE 1 CAPSULE BY MOUTH EVERY DAY 100 capsule 3   fluconazole (DIFLUCAN) 150 MG tablet Take one tablet by mouth at the first sign of symptoms of yeast. If no resolution, repeat dose in 72 hours. 2 tablet 2   fluticasone (FLONASE) 50 MCG/ACT nasal spray Place 2 sprays into Mack nostrils daily.     folic acid (FOLVITE) 1 MG tablet Take 1 tablet (1 mg  total) by mouth daily. 90 tablet 1   metFORMIN (GLUCOPHAGE-XR) 500 MG 24 hr tablet 1,000 mg daily with breakfast.     methotrexate (RHEUMATREX) 5 MG tablet Take 5 tablets (25 mg total) by mouth once a week. Caution:Chemotherapy. Protect from light. 20 tablet 5   olmesartan-hydrochlorothiazide (BENICAR HCT) 20-12.5 MG tablet TAKE 1 TABLET BY MOUTH EVERY DAY 90 tablet 1   OZEMPIC, 0.25 OR 0.5 MG/DOSE, 2 MG/3ML SOPN Inject into the skin.     Phenyleph-Doxylamine-DM-APAP (ALKA SELTZER PLUS PO) Take 2 each by mouth as needed.     predniSONE (DELTASONE) 5 MG tablet TAKE 2 TABLETS BY MOUTH DAILY WITH BREAKFAST. 60 tablet 5   Sodium Chloride ([employer] SALINE SPRAY IN) Inhale 1 spray into the lungs as needed.     solifenacin (VESICARE) 5 MG tablet Take 1 tablet (5 mg total) by mouth daily. 30 tablet 5   Spacer/Aero-Holding Chambers (AEROCHAMBER MV) inhaler Use as instructed 1 each 0   No facility-administered medications prior to visit.   Review of Systems  Review of Systems  Constitutional: Negative.   HENT: Negative.    Respiratory:  Positive for cough, chest tightness, shortness of breath and wheezing.   Musculoskeletal:  Positive for arthralgias.    Physical Exam  BP 104/60 (BP Location: Left Arm, Patient Position: Sitting, Cuff Size: Large)   Pulse 94   Ht '5\' 4"'$  (1.626 m)   Wt 169 lb (76.7 kg)   LMP 10/09/2015   SpO2 99%   BMI 29.01 kg/m  Physical Exam Constitutional:      Appearance: Normal appearance. She is normal weight.  HENT:     Head: Normocephalic and atraumatic.  Cardiovascular:     Rate and Rhythm: Normal rate and regular rhythm.  Pulmonary:     Effort: Pulmonary effort is normal.     Breath sounds: Normal breath sounds. No wheezing, rhonchi or rales.     Comments: ? Fine rales at lung base Musculoskeletal:        General: Normal range of motion.  Skin:    General: Skin is warm and dry.  Neurological:     General: No focal deficit present.     Mental Status: She is  alert and oriented to person, place, and time. Mental status is at baseline.  Psychiatric:        Mood and Affect: Mood normal.        Behavior: Behavior normal.        Thought Content: Thought content normal.        Judgment: Judgment normal.      Lab Results:  CBC    Component Value Date/Time   WBC 11.9 (H) 03/16/2022 1655   RBC 3.95 03/16/2022 1655   HGB 11.5 (L) 03/16/2022 1655   HGB 12.6 10/04/2017 1009   HCT 36.0 03/16/2022 1655   HCT 38.6 10/04/2017 1009   PLT 371.0 03/16/2022 1655  PLT 405 (H) 10/04/2017 1009   MCV 91.2 03/16/2022 1655   MCV 88 10/04/2017 1009   MCH 28.5 12/06/2021 0627   MCHC 32.0 03/16/2022 1655   RDW 15.0 03/16/2022 1655   RDW 14.0 10/04/2017 1009   LYMPHSABS 1.3 03/16/2022 1655   LYMPHSABS 2.6 10/04/2017 1009   MONOABS 0.7 03/16/2022 1655   EOSABS 0.0 03/16/2022 1655   EOSABS 0.1 10/04/2017 1009   BASOSABS 0.1 03/16/2022 1655   BASOSABS 0.0 10/04/2017 1009    BMET    Component Value Date/Time   NA 140 03/16/2022 1655   NA 142 09/03/2020 1219   K 4.0 03/16/2022 1655   CL 101 03/16/2022 1655   CO2 29 03/16/2022 1655   GLUCOSE 156 (H) 03/16/2022 1655   BUN 23 03/16/2022 1655   BUN 22 09/03/2020 1219   CREATININE 1.13 03/16/2022 1655   CREATININE 1.09 06/30/2017 1455   CALCIUM 9.7 03/16/2022 1655   GFRNONAA >60 12/06/2021 0627   GFRAA 76 07/17/2020 0851    BNP No results found for: "BNP"  ProBNP    Component Value Date/Time   PROBNP 5.1 11/18/2013 2315    Imaging: No results found.   Assessment & Plan:   Relapsing polychondritis Hx polycondritis on MXT. Following with Dr. Trudie Reed with rheumatology. Methorexate was recently increased from 5 tablets ('25mg'$ ) to 10 tablets ('50mg'$ ) in attempts to taper off prednisone. Remains on '10mg'$  prednisone daily. Reports increased chest tightness/pain and dyspnea at time of medication change 3 weeks ago. PFTs in June 2023 showed moderate restriction. CT imaging in 2019 showed no evidence of  ILD, air trapping. Ordered repeat HRCT imaging to reassess lung parenchyma for interstitial lung disease relate to medication use. RX albuterol hfa 2 puff q 4-6 hours to use prn sob/chest tightness. Advised she follow up with rheumatology, they recently did labs. I will also reach out to Dr. Shearon Stalls to up date her.    Martyn Ehrich, NP 07/20/2022

## 2022-07-20 NOTE — Patient Instructions (Signed)
We will check high resolution CT d/t shortness of breath/chest pain Reach out to rheumatology to follow-up on labs  Continue MXT as directed Continue prednisone '10mg'$  daily Use Albuterol 2 puffs every 4-6 hours for chest tightness/shortness of breath  Follow-up: Please placed recall for 1-2 months with Dr. Shearon Stalls

## 2022-07-21 DIAGNOSIS — E1165 Type 2 diabetes mellitus with hyperglycemia: Secondary | ICD-10-CM | POA: Diagnosis not present

## 2022-07-21 DIAGNOSIS — Z7984 Long term (current) use of oral hypoglycemic drugs: Secondary | ICD-10-CM | POA: Diagnosis not present

## 2022-07-21 DIAGNOSIS — Z7985 Long-term (current) use of injectable non-insulin antidiabetic drugs: Secondary | ICD-10-CM | POA: Diagnosis not present

## 2022-07-26 NOTE — Telephone Encounter (Signed)
Please let patient know I reviewed with physician. Since rheumatology is managing MXT she should follow up with them regarding chest pain that started after dose was increased. No further changes from our perspective. Well see what CT chest showed which I see is scheduled for 08/03/22

## 2022-07-26 NOTE — Telephone Encounter (Signed)
-----  Message from Freddi Starr, MD sent at 07/25/2022 11:19 AM EST ----- Regarding: RE: polycondritis on MXT If rheumatology is managing the methotrexate, no further changes from our perspective.   Thanks, JD   ----- Message ----- From: Martyn Ehrich, NP Sent: 07/20/2022   3:38 PM EST To: Spero Geralds, MD Subject: polycondritis on MXT                           Patient of yours with polycondritis Following with Dr. Trudie Reed with rheumatology. Recently increased MXT from 25 to '50mg'$  in attempts to taper off prednisone. Remains on '10mg'$  prednisone daily Reports increased chest pain and dyspnea at time of medication change 3 weeks ago. PFTs in June 2023 showed moderate restriction. Ordered HRCT.  RX albuterol hfa to use prn. Advised she follow up with rheumatology, they recently did labs.  Do you want me to give her any directions on MXT? Or anything additional you would recommend?  I told her we would follow-up after CT chest and when I spoke with you may take 1-2 weeks  -Beth

## 2022-07-28 ENCOUNTER — Ambulatory Visit: Payer: BC Managed Care – PPO | Admitting: Medical

## 2022-07-28 VITALS — BP 104/64 | HR 71 | Wt 167.6 lb

## 2022-07-28 DIAGNOSIS — Z79899 Other long term (current) drug therapy: Secondary | ICD-10-CM

## 2022-07-28 DIAGNOSIS — T148XXA Other injury of unspecified body region, initial encounter: Secondary | ICD-10-CM | POA: Diagnosis not present

## 2022-07-28 NOTE — Progress Notes (Signed)
Subjective:  Michelle Mack is a 57 y.o. female who presents for Chief Complaint  Patient presents with   possible blood clot in mouth    Yesterday noticed a red spot in mouth. Today is better but still there     Here for red spot in mouth x 1 day.  Yesterday noticed a small blood blister in both sides of corners of inside of mouth.  No other bleeding or bruising . She is on long term prednisone for years per rheumatology.  Just had labs in 05/2022 with rheumatology, Michelle Mack.  She just recently started a supplement called SynoCell on her own and wondered if this caused the "blood clot" in her mouth.     The SynoCell supplement that contains glucosamine chondroitin, tumeric, quercetin dihydrate, indian frankincense, methylsufolnylmethane, bromelain  No other aggravating or relieving factors.    No other c/o.  Past Medical History:  Diagnosis Date   Anemia    on iron   Back pain    tx with ibuprofen '800mg'$    Bronchitis    treated recently by abx   Cough    non-productive   DUB (dysfunctional uterine bleeding)    Fibroid uterus    GYN--Michelle Mack   GERD (gastroesophageal reflux disease)    Hypertension    Infertility, female    OA (osteoarthritis) of knee 04/02/2020   Mod osteoarthritis, medial compartment of L knee, per x-rays at [facility].  Cortisone injection by Michelle Mack 03/05/20   Polychondritis    Recurrent upper respiratory infection (URI)    in October - tx with abx   Type II or unspecified type diabetes mellitus without mention of complication, not stated as uncontrolled    diagnosed by Michelle Mack   Current Outpatient Medications on File Prior to Visit  Medication Sig Dispense Refill   amLODipine (NORVASC) 5 MG tablet TAKE 1 TABLET (5 MG TOTAL) BY MOUTH DAILY. 90 tablet 2   atorvastatin (LIPITOR) 20 MG tablet TAKE 1 TABLET BY MOUTH EVERY DAY 90 tablet 0   Cholecalciferol (VITAMIN D3) 50 MCG (2000 UT) capsule TAKE 1 CAPSULE BY MOUTH EVERY DAY 100 capsule 3    fluticasone (FLONASE) 50 MCG/ACT nasal spray Place 2 sprays into both nostrils daily.     folic acid (FOLVITE) 1 MG tablet Take 1 tablet (1 mg total) by mouth daily. 90 tablet 1   metFORMIN (GLUCOPHAGE-XR) 500 MG 24 hr tablet 1,000 mg daily with breakfast.     methotrexate (RHEUMATREX) 5 MG tablet Take 5 tablets (25 mg total) by mouth once a week. Caution:Chemotherapy. Protect from light. 20 tablet 5   olmesartan-hydrochlorothiazide (BENICAR HCT) 20-12.5 MG tablet TAKE 1 TABLET BY MOUTH EVERY DAY 90 tablet 1   OZEMPIC, 0.25 OR 0.5 MG/DOSE, 2 MG/3ML SOPN Inject into the skin.     predniSONE (DELTASONE) 5 MG tablet TAKE 2 TABLETS BY MOUTH DAILY WITH BREAKFAST. 60 tablet 5   solifenacin (VESICARE) 5 MG tablet Take 1 tablet (5 mg total) by mouth daily. 30 tablet 5   Spacer/Aero-Holding Chambers (AEROCHAMBER MV) inhaler Use as instructed 1 each 0   No current facility-administered medications on file prior to visit.   The following portions of the patient's history were reviewed and updated as appropriate: allergies, current medications, past family history, past medical history, past social history, past surgical history and problem list.  ROS Otherwise as in subjective above    Objective: BP 104/64   Pulse 71   Wt 167 lb 9.6  oz (76 kg)   LMP 10/09/2015   BMI 28.77 kg/m   General appearance: alert, no distress, well developed, well nourished HEENT: normocephalic, sclerae anicteric, conjunctiva pink and moist,  nares patent, no discharge or erythema, pharynx normal Oral cavity: very faint pinpoint area of erythema on buccal mucosa bilat, but no other lesions, MMM Neck: supple, no lymphadenopathy, no thyromegaly, no masses Skin unremarkable Pulses: 2+ radial pulses, 2+ pedal pulses, normal cap refill Ext: no edema   Assessment: Encounter Diagnoses  Name Primary?   High risk medication use Yes   Blood blister      Plan: Exam mostly unremarkable.  I suspect she probably bit the  inside of her gum in  her sleep but compared to the small area of blood blister in her photo, her exam is mostly normal.   She had recent CBC in 03/2022 and 05/2022.  We will request the 05/2022 labs, but platelets normal in 03/2022.   WBC slightly elevated early 9/23, but resolved in late 03/2022.   She has been chronically mildly anemic.     Reassured on exam today.   I doubt her supplement is an issue, but hold off on supplement 1 week and restart if desired and if her rheumatologist feels this would be good for her.   Advised she consult with rheumatology as well.   There is a small chance of increased risk of bleeding on long term prednisone in light of the ingredients in the arthritis supplement she just stated taking.  We discussed long term risk of prednisone in general.    Continue routine f/u with PCP here and her specialists.   If any new or worse bruising, recheck or call back  Michelle Mack was seen today for possible blood clot in mouth.  Diagnoses and all orders for this visit:  High risk medication use  Blood blister    Follow up: prn

## 2022-08-03 ENCOUNTER — Other Ambulatory Visit: Payer: Self-pay | Admitting: Family Medicine

## 2022-08-03 ENCOUNTER — Ambulatory Visit (HOSPITAL_COMMUNITY): Payer: PRIVATE HEALTH INSURANCE

## 2022-08-03 DIAGNOSIS — E7800 Pure hypercholesterolemia, unspecified: Secondary | ICD-10-CM

## 2022-08-05 ENCOUNTER — Encounter: Payer: Self-pay | Admitting: Family Medicine

## 2022-08-05 ENCOUNTER — Telehealth: Payer: BC Managed Care – PPO | Admitting: Family Medicine

## 2022-08-05 VITALS — Temp 97.7°F | Ht 64.0 in | Wt 167.0 lb

## 2022-08-05 DIAGNOSIS — K219 Gastro-esophageal reflux disease without esophagitis: Secondary | ICD-10-CM | POA: Diagnosis not present

## 2022-08-05 DIAGNOSIS — Z79899 Other long term (current) drug therapy: Secondary | ICD-10-CM

## 2022-08-05 DIAGNOSIS — J019 Acute sinusitis, unspecified: Secondary | ICD-10-CM

## 2022-08-05 DIAGNOSIS — E1122 Type 2 diabetes mellitus with diabetic chronic kidney disease: Secondary | ICD-10-CM | POA: Diagnosis not present

## 2022-08-05 DIAGNOSIS — I10 Essential (primary) hypertension: Secondary | ICD-10-CM

## 2022-08-05 DIAGNOSIS — N182 Chronic kidney disease, stage 2 (mild): Secondary | ICD-10-CM

## 2022-08-05 MED ORDER — DOXYCYCLINE HYCLATE 100 MG PO TABS: 100.0000 mg | ORAL_TABLET | Freq: Two times a day (BID) | ORAL | 0 refills | Status: DC

## 2022-08-05 NOTE — Progress Notes (Signed)
Start time: 12:31 End time: 12:55  Virtual Visit via Video Note  I connected with Michelle Mack on 08/05/22 by a video enabled telemedicine application and verified that I am speaking with the correct person using two identifiers.  Location: Patient: home Provider: office   I discussed the limitations of evaluation and management by telemedicine and the availability of in person appointments. The patient expressed understanding and agreed to proceed.  History of Present Illness:  Chief Complaint  Patient presents with   Cough    VIRTUAL started with chills then went to ST Friday. Now she has cough and ear pain as well sinus congestion. Mucus is green/yellow in color. Home covid test yesterday was negative. Also having a lot of acid reflux.   1/26 she started  with chills and sore throat. Monday (1/29) she developed nasal congestion, discomfort in her eyes, between her eyes, headache. She started coughing, cough is worse when she lays down. Nasal drainage is yellow-green. Cough is productive of the same discolored mucus. Currently has headache across the forehead.  Has some wheezing, after coughing a lot. Hasn't used her albuterol. She is currently on '9mg'$  of prednisone daily. Denies DOE, no SOB at rest.  L ear has been hurting since the sore throat started onFriday. Salt water gargles helps with the ear pain and throat pain. No swollen glands or neck pain.  Denies being exposed to sick kids. Had a negative COVID test yesterday.  Using sinus rinses twice daily. Theraflu helps some--tyl/benadryl, decong (PE)--started yesterday. She has been taking Alka seltzer plus since Friday, last dose last night.--overlaps tylenol, PE, antihistamine Medicine helps some, but she feels bad in the morning again. Getting decongestants from both meds; hasn't checked her BP. BP Readings from Last 3 Encounters:  07/28/22 104/64  07/20/22 104/60  05/25/22 110/68   She feels like she has  been getting worse, not better.    She has also been having acid reflux. No change to diet--eating soups, nothing spicy or citrus/acidic.  Reviewed allergies, and recent ABX prescriptions-- 06/22/22 z-pak Doxy 08/2021  PMH, PSH, SH reviewed  Outpatient Encounter Medications as of 08/05/2022  Medication Sig Note   amLODipine (NORVASC) 5 MG tablet TAKE 1 TABLET (5 MG TOTAL) BY MOUTH DAILY.    atorvastatin (LIPITOR) 20 MG tablet TAKE 1 TABLET BY MOUTH EVERY DAY    Cholecalciferol (VITAMIN D3) 50 MCG (2000 UT) capsule TAKE 1 CAPSULE BY MOUTH EVERY DAY    diphenhydrAMINE-PE-APAP (THERAFLU WARMING RELIEF FLU PO) Take 1 each by mouth as needed. 08/05/2022: Powder in water   fluticasone (FLONASE) 50 MCG/ACT nasal spray Place 2 sprays into both nostrils daily.    folic acid (FOLVITE) 1 MG tablet Take 1 tablet (1 mg total) by mouth daily.    methotrexate (RHEUMATREX) 5 MG tablet Take 5 tablets (25 mg total) by mouth once a week. Caution:Chemotherapy. Protect from light.    olmesartan-hydrochlorothiazide (BENICAR HCT) 20-12.5 MG tablet TAKE 1 TABLET BY MOUTH EVERY DAY    OZEMPIC, 0.25 OR 0.5 MG/DOSE, 2 MG/3ML SOPN Inject into the skin. 08/05/2022: .'5mg'$  dose    Phenyleph-Doxylamine-DM-APAP (ALKA SELTZER PLUS PO) Take 2 tablets by mouth as needed. 08/05/2022: Took 2 last night   predniSONE (DELTASONE) 1 MG tablet Take 4 mg by mouth daily with breakfast.    predniSONE (DELTASONE) 5 MG tablet TAKE 2 TABLETS BY MOUTH DAILY WITH BREAKFAST. (Patient taking differently: Take 5 mg by mouth daily with breakfast.)    solifenacin (VESICARE) 5 MG  tablet Take 1 tablet (5 mg total) by mouth daily.    metFORMIN (GLUCOPHAGE-XR) 500 MG 24 hr tablet 1,000 mg daily with breakfast. (Patient not taking: Reported on 08/05/2022)    Spacer/Aero-Holding Chambers (AEROCHAMBER MV) inhaler Use as instructed (Patient not taking: Reported on 08/05/2022)    No facility-administered encounter medications on file as of 08/05/2022.    '9mg'$   prednisone  Allergies reviewed--PCN, Sulfa, cephalosporins, nitrofurantoin, meloxicam   ROS:  URI symptoms including sore throat, cough, L ear pain per HPI.  Some frontal headaches. No further chills. Never had fever.  No n/v/d. No chest pain, shortness of breath, rash or other concerns. See HPI    Observations/Objective:  Temp 97.7 F (36.5 C) (Temporal)   Ht '5\' 4"'$  (1.626 m)   Wt 167 lb (75.8 kg)   LMP 10/09/2015   BMI 28.67 kg/m   Well-appearing female, in no distress. She is speaking easily.   Wet-sounding cough noted once or twice during the visit. She is alert and oriented, cranial nerves grossly intact. Exam is limited by virtual nature of the visit.    Assessment and Plan:  Acute non-recurrent sinusitis, unspecified location - continue sinus rinses.  recommended mucinex.  Start ABX. f/u if sx persist or worsen - Plan: doxycycline (VIBRA-TABS) 100 MG tablet  Gastroesophageal reflux disease, unspecified whether esophagitis present - recommended Prilosec OTC vs Pepcid (over-the-counter) to use for up to 2 weeks  Type 2 diabetes mellitus with stage 2 chronic kidney disease, without long-term current use of insulin (HCC) - Diabetic, on chronic prednisone, increased risk for bacterial complication of what was likely a viral illness  High risk medication use - on chronic prednisone  Essential hypertension, benign - advised--not supposed to use decongestants with HTN; her BP's have been very good. Should monitor BP and d/c decongestant if elevated  Check your blood pressure periodically, to ensure that is remains <140/90.  If it is higher, you need to stop the Theraflu and all decongestants (phenylephrine, pseudoephedrine are decongestants that can raise your blood pressure).  Continue to try and stay well hydrated.  Stop using the [place] medication. Start taking Mucinex DM 12 hour, twice daily. This has an expectorant to thin out the phlegm, and a cough suppressant  to help with your cough. You can continue to use this along with Theraflu, as long as you double check the ingredients, and verify that it doesn't contain dextromethorphan or guaifenesin.  Take the antibiotics as directed. Follow up if symptoms persist or worsen. I hope you feel better soon!    Follow Up Instructions:    I discussed the assessment and treatment plan with the patient. The patient was provided an opportunity to ask questions and all were answered. The patient agreed with the plan and demonstrated an understanding of the instructions.   The patient was advised to call back or seek an in-person evaluation if the symptoms worsen or if the condition fails to improve as anticipated.  I spent 30 minutes dedicated to the care of this patient, including pre-visit review of records, face to face time, post-visit ordering of testing and documentation.    Vikki Ports, MD

## 2022-08-05 NOTE — Patient Instructions (Signed)
Check your blood pressure periodically, to ensure that is remains <140/90.  If it is higher, you need to stop the Theraflu and all decongestants (phenylephrine, pseudoephedrine are decongestants that can raise your blood pressure).  Continue to try and stay well hydrated.  Stop using the [place] medication. Start taking Mucinex DM 12 hour, twice daily. This has an expectorant to thin out the phlegm, and a cough suppressant to help with your cough. You can continue to use this along with Theraflu, as long as you double check the ingredients, and verify that it doesn't contain dextromethorphan or guaifenesin.  Take the antibiotics as directed. Follow up if symptoms persist or worsen. I hope you feel better soon!

## 2022-08-18 ENCOUNTER — Ambulatory Visit (HOSPITAL_COMMUNITY): Payer: BC Managed Care – PPO

## 2022-08-20 ENCOUNTER — Ambulatory Visit (HOSPITAL_COMMUNITY)
Admission: RE | Admit: 2022-08-20 | Discharge: 2022-08-20 | Disposition: A | Discharge status: Home / Self Care | Payer: BC Managed Care – PPO | Source: Ambulatory Visit | Attending: Primary Care | Admitting: Primary Care

## 2022-08-20 DIAGNOSIS — M941 Relapsing polychondritis: Secondary | ICD-10-CM | POA: Diagnosis not present

## 2022-08-20 DIAGNOSIS — R0602 Shortness of breath: Secondary | ICD-10-CM

## 2022-08-20 DIAGNOSIS — J849 Interstitial pulmonary disease, unspecified: Secondary | ICD-10-CM | POA: Diagnosis not present

## 2022-08-23 ENCOUNTER — Telehealth: Payer: Self-pay | Admitting: Primary Care

## 2022-08-23 NOTE — Telephone Encounter (Signed)
Martyn Ehrich, NP 08/23/2022  2:34 PM EST Back to Top    Please let patient know HRCT showed no evidence of lung disease/fibrosis. Normal imaging    Called and spoke with pt letting her know the results of HRCT and she verbalized understanding. Nothing further needed.

## 2022-08-23 NOTE — Telephone Encounter (Signed)
Pt. Calling to go over CT results looked into just waiting on Np Michelle Mack to review and share her thought with pt. Please advise pt. Through my chart

## 2022-08-23 NOTE — Progress Notes (Signed)
Please let patient know HRCT showed no evidence of lung disease/fibrosis. Normal imaging

## 2022-08-27 ENCOUNTER — Other Ambulatory Visit: Payer: Self-pay | Admitting: Family Medicine

## 2022-08-27 DIAGNOSIS — I10 Essential (primary) hypertension: Secondary | ICD-10-CM

## 2022-08-27 DIAGNOSIS — E7800 Pure hypercholesterolemia, unspecified: Secondary | ICD-10-CM

## 2022-08-27 NOTE — Telephone Encounter (Signed)
Pt should not be out. Pt was given #90 with 2 refills on atorvastatin and given 90 days back in 07/2022

## 2022-09-06 ENCOUNTER — Encounter: Payer: Self-pay | Admitting: Family Medicine

## 2022-09-06 ENCOUNTER — Telehealth: Payer: BC Managed Care – PPO | Admitting: Family Medicine

## 2022-09-06 VITALS — Temp 96.3°F | Ht 64.0 in | Wt 167.0 lb

## 2022-09-06 DIAGNOSIS — J069 Acute upper respiratory infection, unspecified: Secondary | ICD-10-CM

## 2022-09-06 DIAGNOSIS — Z20828 Contact with and (suspected) exposure to other viral communicable diseases: Secondary | ICD-10-CM | POA: Diagnosis not present

## 2022-09-06 NOTE — Progress Notes (Signed)
Start time: 11:57 End time: 12:09  Virtual Visit via Video Note  I connected with Michelle Mack on 09/06/22 by a video enabled telemedicine application and verified that I am speaking with the correct person using two identifiers.  Location: Patient: home, initially, then was a passenger in a car Provider: office   I discussed the limitations of evaluation and management by telemedicine and the availability of in person appointments. The patient expressed understanding and agreed to proceed.  History of Present Illness:  Chief Complaint  Patient presents with   Cough    VIRTUAL cough, fever and body aches that started Saturday. Kids in her house tested positive for RSV on Friday. Not taking any OTC meds, just using cough drops. No home covid tests.    Patient presents with possible RSV.  States she had had +contact with children with RSV. She developed a temp of 102 on Saturday, as well as cough, fever, body aches. Has been taking meds, so unsure how much fever she has been having, recalls having a temp up to 99 when meds wear off.  Today--still has some body aches and cough.  Nonproductive, no change in breathing, at baseline.  She has been taking Tylenol, cough drops Prednisone 9 mg  PMH, PSH, SH reviewed  Outpatient Encounter Medications as of 09/06/2022  Medication Sig Note   acetaminophen (TYLENOL) 500 MG tablet Take 500 mg by mouth every 6 (six) hours as needed. 09/06/2022: Last dose today at 9am   amLODipine (NORVASC) 5 MG tablet TAKE 1 TABLET (5 MG TOTAL) BY MOUTH DAILY.    atorvastatin (LIPITOR) 20 MG tablet TAKE 1 TABLET BY MOUTH EVERY DAY    Cholecalciferol (VITAMIN D3) 50 MCG (2000 UT) capsule TAKE 1 CAPSULE BY MOUTH EVERY DAY    folic acid (FOLVITE) 1 MG tablet Take 1 tablet (1 mg total) by mouth daily.    methotrexate (RHEUMATREX) 5 MG tablet Take 5 tablets (25 mg total) by mouth once a week. Caution:Chemotherapy. Protect from light.     olmesartan-hydrochlorothiazide (BENICAR HCT) 20-12.5 MG tablet TAKE 1 TABLET BY MOUTH EVERY DAY    OZEMPIC, 0.25 OR 0.5 MG/DOSE, 2 MG/3ML SOPN Inject into the skin. 08/05/2022: .'5mg'$  dose    predniSONE (DELTASONE) 1 MG tablet Take 4 mg by mouth daily with breakfast.    predniSONE (DELTASONE) 5 MG tablet TAKE 2 TABLETS BY MOUTH DAILY WITH BREAKFAST. (Patient taking differently: Take 5 mg by mouth daily with breakfast.)    fluticasone (FLONASE) 50 MCG/ACT nasal spray Place 2 sprays into both nostrils daily. (Patient not taking: Reported on 09/06/2022) 09/06/2022: As needed   solifenacin (VESICARE) 5 MG tablet Take 1 tablet (5 mg total) by mouth daily. (Patient not taking: Reported on 09/06/2022)    Spacer/Aero-Holding Chambers (AEROCHAMBER MV) inhaler Use as instructed (Patient not taking: Reported on 08/05/2022)    [DISCONTINUED] diphenhydrAMINE-PE-APAP (THERAFLU WARMING RELIEF FLU PO) Take 1 each by mouth as needed. 08/05/2022: Powder in water   [DISCONTINUED] doxycycline (VIBRA-TABS) 100 MG tablet Take 1 tablet (100 mg total) by mouth 2 (two) times daily.    [DISCONTINUED] metFORMIN (GLUCOPHAGE-XR) 500 MG 24 hr tablet 1,000 mg daily with breakfast. (Patient not taking: Reported on 08/05/2022)    [DISCONTINUED] Phenyleph-Doxylamine-DM-APAP (ALKA SELTZER PLUS PO) Take 2 tablets by mouth as needed. 08/05/2022: Took 2 last night   No facility-administered encounter medications on file as of 09/06/2022.   Alllergies reviewed  ROS:  fever and URI symptoms per HPI.  No significant HA, sinus  pain, chest pain, shortness of breath. No n/v/d, rash or other concerns. See HPI     Observations/Objective:  Temp (!) 96.3 F (35.7 C) (Temporal)   Ht '5\' 4"'$  (1.626 m)   Wt 167 lb (75.8 kg)   LMP 10/09/2015   BMI 28.67 kg/m   Well-appearing, pleasant female, in good spirits. She is speaking easily. No coughing during visit, nor any throat clearing or sniffling. She is not wearing a mask, was in car with someone else Exam  is limited due to the virtual nature of the visit.   Assessment and Plan:  Viral upper respiratory illness - suspect RSV, given known exposure.  Supportive measures reviewed, answered questions re: RSV. No specific treatment, other than directed at symptoms, mild  RSV exposure  Pt at higher risk for complications due to underlying lung disease and chronic prednisone use, but <60, so wasn't candidate for RSV vaccine.  Her symptoms are very mild, breathing is at baseline.   Supportive care reviewed. F/u if sx persist or worsen. Pt asked about testing--advised no need, since it would not affect recommendations/care.   Stay well hydrated. Use Mucinex DM as needed for cough. Continue to use tylenol if needed for any fever or pain. I recommend wearing a mask while coughing and febrile--ideally you should remain home when sick, until you are without a fever for 24 hours. Return for re-evaluation if symptoms persist or worsen.   Follow Up Instructions:    I discussed the assessment and treatment plan with the patient. The patient was provided an opportunity to ask questions and all were answered. The patient agreed with the plan and demonstrated an understanding of the instructions.   The patient was advised to call back or seek an in-person evaluation if the symptoms worsen or if the condition fails to improve as anticipated.  I spent 20 minutes dedicated to the care of this patient, including pre-visit review of records, face to face time, post-visit ordering of testing and documentation.    Vikki Ports, MD

## 2022-09-06 NOTE — Patient Instructions (Signed)
Stay well hydrated. Use Mucinex DM as needed for cough. Continue to use tylenol if needed for any fever or pain. I recommend wearing a mask while coughing and febrile--ideally you should remain home when sick, until you are without a fever for 24 hours. Return for re-evaluation if symptoms persist or worsen.

## 2022-09-08 DIAGNOSIS — Z01419 Encounter for gynecological examination (general) (routine) without abnormal findings: Secondary | ICD-10-CM | POA: Diagnosis not present

## 2022-09-08 DIAGNOSIS — N898 Other specified noninflammatory disorders of vagina: Secondary | ICD-10-CM | POA: Diagnosis not present

## 2022-09-08 DIAGNOSIS — Z124 Encounter for screening for malignant neoplasm of cervix: Secondary | ICD-10-CM | POA: Diagnosis not present

## 2022-09-08 LAB — RESULTS CONSOLE HPV: CHL HPV: NEGATIVE

## 2022-09-08 LAB — HM PAP SMEAR: HM Pap smear: NEGATIVE

## 2022-09-13 ENCOUNTER — Other Ambulatory Visit: Payer: Self-pay | Admitting: Obstetrics and Gynecology

## 2022-09-13 DIAGNOSIS — Z1231 Encounter for screening mammogram for malignant neoplasm of breast: Secondary | ICD-10-CM

## 2022-09-15 DIAGNOSIS — M5136 Other intervertebral disc degeneration, lumbar region: Secondary | ICD-10-CM | POA: Diagnosis not present

## 2022-09-15 DIAGNOSIS — M0609 Rheumatoid arthritis without rheumatoid factor, multiple sites: Secondary | ICD-10-CM | POA: Diagnosis not present

## 2022-09-15 DIAGNOSIS — M1991 Primary osteoarthritis, unspecified site: Secondary | ICD-10-CM | POA: Diagnosis not present

## 2022-09-15 DIAGNOSIS — M941 Relapsing polychondritis: Secondary | ICD-10-CM | POA: Diagnosis not present

## 2022-09-20 ENCOUNTER — Ambulatory Visit
Admission: RE | Admit: 2022-09-20 | Discharge: 2022-09-20 | Disposition: A | Discharge status: Home / Self Care | Payer: BC Managed Care – PPO | Source: Ambulatory Visit | Attending: Obstetrics and Gynecology | Admitting: Obstetrics and Gynecology

## 2022-09-20 ENCOUNTER — Ambulatory Visit: Payer: BC Managed Care – PPO

## 2022-09-20 ENCOUNTER — Ambulatory Visit: Payer: BC Managed Care – PPO | Admitting: Orthopedic Surgery

## 2022-09-20 DIAGNOSIS — Z1231 Encounter for screening mammogram for malignant neoplasm of breast: Secondary | ICD-10-CM

## 2022-09-23 ENCOUNTER — Other Ambulatory Visit: Payer: Self-pay | Admitting: *Deleted

## 2022-09-23 ENCOUNTER — Other Ambulatory Visit: Payer: Self-pay | Admitting: Family Medicine

## 2022-09-23 DIAGNOSIS — E7800 Pure hypercholesterolemia, unspecified: Secondary | ICD-10-CM

## 2022-09-23 DIAGNOSIS — I10 Essential (primary) hypertension: Secondary | ICD-10-CM

## 2022-09-23 MED ORDER — OLMESARTAN MEDOXOMIL-HCTZ 20-12.5 MG PO TABS: 1.0000 | ORAL_TABLET | Freq: Every day | ORAL | 0 refills | Status: DC

## 2022-09-23 MED ORDER — ATORVASTATIN CALCIUM 20 MG PO TABS: 20.0000 mg | ORAL_TABLET | Freq: Every day | ORAL | 0 refills | Status: DC

## 2022-09-27 ENCOUNTER — Telehealth: Payer: Self-pay | Admitting: Orthopedic Surgery

## 2022-09-27 ENCOUNTER — Encounter: Payer: Self-pay | Admitting: Orthopedic Surgery

## 2022-09-27 ENCOUNTER — Other Ambulatory Visit: Payer: Self-pay

## 2022-09-27 ENCOUNTER — Other Ambulatory Visit (INDEPENDENT_AMBULATORY_CARE_PROVIDER_SITE_OTHER): Payer: BC Managed Care – PPO

## 2022-09-27 ENCOUNTER — Ambulatory Visit: Payer: BC Managed Care – PPO | Admitting: Orthopedic Surgery

## 2022-09-27 DIAGNOSIS — M25562 Pain in left knee: Secondary | ICD-10-CM

## 2022-09-27 DIAGNOSIS — M25561 Pain in right knee: Secondary | ICD-10-CM | POA: Diagnosis not present

## 2022-09-27 DIAGNOSIS — G8929 Other chronic pain: Secondary | ICD-10-CM | POA: Diagnosis not present

## 2022-09-27 NOTE — Progress Notes (Signed)
Office Visit Note   Patient: Michelle Mack           Date of Birth: 1966/05/10           MRN: [id number] Visit Date: 09/27/2022              Requested by: [name], MD [address] [place],  [place] [address] PCP: [name], MD  Chief Complaint  Patient presents with   Right Knee - Pain   Left Knee - Pain      HPI: Patient is a 57 year old woman who presents for second opinion regarding her knees.  Patient states she has left knee greater than right knee pain.  Patient states she is only had about a month pain relief with a steroid injection.  Patient has a history of diabetes and a rheumatoid arthritis.  She is currently on prednisone 8 mg a day and weaning off and also on methotrexate.  Assessment & Plan: Visit Diagnoses:  1. Chronic pain of both knees     Plan: Discussed with the rheumatologic medicines that she has an increased risk of complication with a total knee arthroplasty.  Discussed that we could provide a steroid injection for the left knee to buy her some time until she can wean off the prednisone and possibly temporarily hold the methotrexate for surgery.  Patient states she is not interested in a steroid injection at this time.  Discussed that I would also recommend  prerehab to work on strengthening her knee prior to surgical intervention.  Patient was provided a copy of her x-rays and I discussed with her on the phone, after she left, the recommendations that were provided during her visit, and discussed that she could call or follow-up at any time if she had further questions.  Follow-Up Instructions: Return if symptoms worsen or fail to improve.   Ortho Exam  Patient is alert, oriented, no adenopathy, well-dressed, normal affect, normal respiratory effort.  Patient's hemoglobin A1c was 8.4 a year ago she states that it is now in the sixes.  Examination of both knees patient has no effusion no cellulitis.  There is no varus or valgus  malalignment.  Collaterals and cruciates are stable.  Patient is primarily tender to palpation of the medial joint line of the left knee.  Imaging: XR Knee 1-2 Views Right  Result Date: 09/27/2022 2 view radiographs of the right knee shows a congruent joint with no varus or valgus malalignment no bony spurs.  XR Knee 1-2 Views Left  Result Date: 09/27/2022 2 view radiographs of the left knee shows neutral varus valgus alignment.  Patient has joint space narrowing of the medial joint line with very small osteophytic bone spurs.  No images are attached to the encounter.  Labs: Lab Results  Component Value Date   HGBA1C 8.4 09/08/2021   HGBA1C 7.6 (A) 05/14/2021   HGBA1C 7.9 07/02/2020   REPTSTATUS 08/17/2009 FINAL 08/16/2009   CULT NO GROWTH 08/16/2009   LABORGA NO GROWTH 07/27/2016     Lab Results  Component Value Date   ALBUMIN 4.1 03/16/2022   ALBUMIN 3.9 01/18/2020   ALBUMIN 4.2 05/30/2018    Lab Results  Component Value Date   MG 1.9 04/19/2017   Lab Results  Component Value Date   VD25OH 39 08/23/2016   VD25OH 26 (L) 11/26/2015   VD25OH 35 08/27/2015    No results found for: "PREALBUMIN"    Latest Ref Rng & Units 03/16/2022    4:55  PM 12/06/2021    6:27 AM 01/18/2020   10:15 PM  CBC EXTENDED  WBC 4.0 - 10.5 K/uL 11.9  10.2  14.4   RBC 3.87 - 5.11 Mil/uL 3.95  4.10  4.46   Hemoglobin 12.0 - 15.0 g/dL 11.5  11.7  12.5   HCT 36.0 - 46.0 % 36.0  36.7  39.6   Platelets 150.0 - 400.0 K/uL 371.0  328  312   NEUT# 1.4 - 7.7 K/uL 9.7  7.9  10.4   Lymph# 0.7 - 4.0 K/uL 1.3  1.5  2.6      There is no height or weight on file to calculate BMI.  Orders:  Orders Placed This Encounter  Procedures   XR Knee 1-2 Views Right   XR Knee 1-2 Views Left   No orders of the defined types were placed in this encounter.    Procedures: No procedures performed  Clinical Data: No additional findings.  ROS:  All other systems negative, except as noted in the  HPI. Review of Systems  Objective: Vital Signs: LMP 10/09/2015   Specialty Comments:  No specialty comments available.  PMFS History: Patient Active Problem List   Diagnosis Date Noted   Acute sore throat 06/22/2022   Nonproliferative diabetic retinopathy of both eyes without macular edema ([place]) 11/26/2020   Atrophic vaginitis 10/31/2020   Bronchitis 10/31/2020   Colon cancer screening 10/31/2020   Constipation 10/31/2020   External hemorrhoids 10/31/2020   Female stress incontinence 10/31/2020   Flatulence, eructation and gas pain 10/31/2020   Hemorrhage of anus and rectum 10/31/2020   Iron deficiency anemia 10/31/2020   Irritable bowel syndrome 10/31/2020   Epigastric pain 10/31/2020   Sacral back pain 10/31/2020   OA (osteoarthritis) of knee 04/02/2020   Unstable angina ([place]) 07/19/2018   Chest wall pain 07/18/2018   Polychondritis 07/18/2018   Type 2 diabetes mellitus with hyperglycemia ([place]) 07/18/2018   Hypercholesteremia 07/18/2018   On prednisone therapy 05/02/2017   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   Fibroid, uterine 11/13/2015   Mediastinal adenopathy 10/23/2015   Collapse of left lung 10/23/2015   Relapsing polychondritis 10/23/2015   High risk medication use 07/14/2015   Cellulitis of back except buttock 06/13/2015   Type 2 diabetes mellitus, controlled, with renal complications ([place]) 99991111   CKD (chronic kidney disease) stage 2, GFR 60-89 ml/min 11/06/2014   Essential hypertension 05/22/2014   Chronic polychondritis 05/02/2014   Dysfunctional uterine bleeding 04/29/2014   OSA (obstructive sleep apnea) 04/29/2014   Other specified respiratory disorders 04/29/2014   PND (paroxysmal nocturnal dyspnea) 04/29/2014   Microscopic hematuria 04/12/2014   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/26/2014   Positive ANA (antinuclear antibody) 02/26/2014   Wheezing 09/14/2013   Vitamin D deficiency 07/31/2013   Obesity (BMI 30-39.9)    Impaired fasting glucose 05/24/2012   Fibroid  uterus 05/24/2012   Infertility, female    DUB (dysfunctional uterine bleeding)    Chronic cough 05/25/2011   GERD (gastroesophageal reflux disease) 05/25/2011   Anemia 05/25/2011   Essential hypertension, benign 04/05/2011   Past Medical History:  Diagnosis Date   Anemia    on iron   Back pain    tx with ibuprofen 800mg    Bronchitis    treated recently by abx   Cough    non-productive   DUB (dysfunctional uterine bleeding)    Fibroid uterus    GYN--Dr. Rivard   GERD (gastroesophageal reflux disease)    Hypertension    Infertility, female    OA (osteoarthritis) of  knee 04/02/2020   Mod osteoarthritis, medial compartment of L knee, per x-rays at [facility].  Cortisone injection by Dr. Layne Benton 03/05/20   Polychondritis    Recurrent upper respiratory infection (URI)    in October - tx with abx   Type II or unspecified type diabetes mellitus without mention of complication, not stated as uncontrolled    diagnosed by Dr. Berdine Addison    Family History  Problem Relation Age of Onset   Hypertension Mother    Diabetes Mother    Hypertension Father    Diabetes Father    Cancer Father        prostate cancer--metastasized 2021   Hypertension Sister    Thyroid nodules Sister    Diabetes Maternal Grandmother    Breast cancer Maternal Grandmother    Heart disease Maternal Grandmother    Heart disease Paternal Grandmother     Past Surgical History:  Procedure Laterality Date   BRONCHOSCOPY  01/02/2014   at [facility] expiratory collapse of trachea and focal narrowing in L mainstem bronchus and LUL   ENDOMETRIAL VAPORIZATION W/ VERSAPOINT     LEFT HEART CATH AND CORONARY ANGIOGRAPHY N/A 07/19/2018   Procedure: LEFT HEART CATH AND CORONARY ANGIOGRAPHY;  Surgeon: Nigel Mormon, MD;  Location: [facility];  Service: Cardiovascular;  Laterality: N/A;   MYOMECTOMY  07/05/2000   SUPRACERVICAL ABDOMINAL HYSTERECTOMY     still has ovaries   Social History   Occupational  History   Occupation: Therapist, art at [employer]: [employer]  Tobacco Use   Smoking status: Never    Passive exposure: Never   Smokeless tobacco: Never  Vaping Use   Vaping Use: Never used  Substance and Sexual Activity   Alcohol use: Yes    Comment: 0-1 drink/week   Drug use: No   Sexual activity: Not Currently    Birth control/protection: None

## 2022-09-27 NOTE — Telephone Encounter (Signed)
Pt called requesting a call back form Autumn F. Pt did not leave a reason for call back. Pt phone number is [phone]

## 2022-09-27 NOTE — Telephone Encounter (Signed)
Dr. Sharol Given sw pt and discussed that she will need a TKA of the left knee recommended proceeding when she is off of her prednisone. Discussed that we could provide a steroid injection to provide temporary relief until she is ready for TKR. Recommended pre rehab therapy to strengthen knee prior to surgery. Discussed that she is at increased risk for surgical complications due to her rheumatoid arthritis  and RA medications

## 2022-10-06 ENCOUNTER — Ambulatory Visit: Payer: BC Managed Care – PPO | Admitting: Family Medicine

## 2022-10-06 ENCOUNTER — Encounter: Payer: Self-pay | Admitting: Family Medicine

## 2022-10-06 VITALS — BP 108/64 | HR 88 | Ht 64.0 in | Wt 170.4 lb

## 2022-10-06 DIAGNOSIS — E559 Vitamin D deficiency, unspecified: Secondary | ICD-10-CM | POA: Diagnosis not present

## 2022-10-06 DIAGNOSIS — N182 Chronic kidney disease, stage 2 (mild): Secondary | ICD-10-CM | POA: Diagnosis not present

## 2022-10-06 DIAGNOSIS — Z5181 Encounter for therapeutic drug level monitoring: Secondary | ICD-10-CM

## 2022-10-06 DIAGNOSIS — E1122 Type 2 diabetes mellitus with diabetic chronic kidney disease: Secondary | ICD-10-CM | POA: Diagnosis not present

## 2022-10-06 DIAGNOSIS — R5383 Other fatigue: Secondary | ICD-10-CM

## 2022-10-06 DIAGNOSIS — R079 Chest pain, unspecified: Secondary | ICD-10-CM

## 2022-10-06 NOTE — Progress Notes (Signed)
Chief Complaint  Patient presents with   Fatigue    Has been feeling tired and drained. She woke up out her sleep Thursday night with her hand tingling, chest tightness and  SOB. Husband wanted her to go to ER, she declined. Then Sat she slept all day as well as Sunday. No energy.    3/29 she woke up at 3 am (early Friday morning) with trouble breathing and some chest pain.  She had to sit up in order to be able to breathe.  She never used her inhaler--didn't feel like wheezing.  Taking a hot shower helped some, and breathing improved throughout the day. No further chest pain or shortness of breath since then. She also noticed (when she woke up that morning with CP) that the palm of her left hand and 2nd through 5th fingers were tingling.  Tingling lasted for about an hour.  She had a slight headache that day.  No neck pain.   Friday 3/29 she felt tired, but over the weekend she felt more drained.  She slept most of both weekend days. She has been working this week--still feels tired, but not totally drained as she had been. Denies flare of allergies, no body aches, no cold symptoms.  No further tingling in the hand. No significant worsening in her strength of the L hand (has prev reported some weakness). Denies feeling LH or dizzy. Feels like she is well hydrated. Denies urinary complaints.  She hasn't taken any vitamin D since her rx (for OTC 2000 IU) ran out. Thinks she hasn't had any in a year.  A rx was sent (per her request) for #100 with 3RF in 02/2022, but she was never made aware by the pharmacy, never picked it up. She called today to see if she can get it refilled. Last D level was 50 in 09/2021 (done by endo, likely taking supplement at that time).   HTN--BP's have been running 120/68. No headaches (just slight one on Friday), dizziness, edema. BP Readings from Last 3 Encounters:  10/06/22 (!) 92/58  07/28/22 104/64  07/20/22 104/60   Diabetes--Taking ozempic x 6 months.  She  reports her last A1c was 6.6% in January 2024 (had been up to 8.4% 09/2021). Denies nausea, vomiting. Denies hypoglycemia. On prednisone 8mg  daily (decreased from 9mg  a month ago).  PMH, PSH, SH reviewed  Outpatient Encounter Medications as of 10/06/2022  Medication Sig Note   amLODipine (NORVASC) 5 MG tablet TAKE 1 TABLET (5 MG TOTAL) BY MOUTH DAILY.    atorvastatin (LIPITOR) 20 MG tablet Take 1 tablet (20 mg total) by mouth daily.    folic acid (FOLVITE) 1 MG tablet Take 1 tablet (1 mg total) by mouth daily.    methotrexate (RHEUMATREX) 5 MG tablet Take 5 tablets (25 mg total) by mouth once a week. Caution:Chemotherapy. Protect from light. (Patient taking differently: Take 50 mg by mouth once a week. Caution:Chemotherapy. Protect from light.)    olmesartan-hydrochlorothiazide (BENICAR HCT) 20-12.5 MG tablet Take 1 tablet by mouth daily.    OZEMPIC, 0.25 OR 0.5 MG/DOSE, 2 MG/3ML SOPN Inject into the skin. 08/05/2022: .5mg  dose    predniSONE (DELTASONE) 1 MG tablet Take 3 mg by mouth daily with breakfast.    predniSONE (DELTASONE) 5 MG tablet TAKE 2 TABLETS BY MOUTH DAILY WITH BREAKFAST. (Patient taking differently: Take 5 mg by mouth daily with breakfast.)    acetaminophen (TYLENOL) 500 MG tablet Take 500 mg by mouth every 6 (six) hours as needed. (  Patient not taking: Reported on 10/06/2022) 10/06/2022: As needed   Cholecalciferol (VITAMIN D3) 50 MCG (2000 UT) capsule TAKE 1 CAPSULE BY MOUTH EVERY DAY (Patient not taking: Reported on 10/06/2022) 10/06/2022: Hasn't taken in probably a year (never picked up the 02/2022 rx)   fluticasone (FLONASE) 50 MCG/ACT nasal spray Place 2 sprays into both nostrils daily. (Patient not taking: Reported on 09/06/2022) 10/06/2022: As needed   [DISCONTINUED] solifenacin (VESICARE) 5 MG tablet Take 1 tablet (5 mg total) by mouth daily. (Patient not taking: Reported on 09/06/2022)    [DISCONTINUED] Spacer/Aero-Holding Chambers (AEROCHAMBER MV) inhaler Use as instructed (Patient not  taking: Reported on 08/05/2022)    No facility-administered encounter medications on file as of 10/06/2022.   ROS: no fever, chills, URI or allergy symptoms.  No current chest pain or shortness of breath (just episode 5 days ago, none since).  No n/v/d, bleeding, rashes. No recurrent numbness/tingling (just 5 days ago), no change in strength, or other neurologic symptoms. +fatigue, feeling drained. No urinary complaints. See HPI   PHYSICAL EXAM:  BP (!) 92/58   Pulse 88   Ht 5\' 4"  (1.626 m)   Wt 170 lb 6.4 oz (77.3 kg)   LMP 10/09/2015   SpO2 98%   BMI 29.25 kg/m   108/64 on repeat by MD Wt Readings from Last 3 Encounters:  10/06/22 170 lb 6.4 oz (77.3 kg)  09/06/22 167 lb (75.8 kg)  08/05/22 167 lb (75.8 kg)   Pleasant, well-appearing female, in no distress. Doesn't appear particularly fatigued. HEENT: conjunctiva and sclera are clear, EOMI. TM's and EAC's normal. Nose: mild edema on L, no mucus, no sinus tenderness. OP clear.   Neck: no lymphadenopathy or thyromegaly Heart: regular rate and rhythm Chest wall: Tender at upper costochondral junctions, L>R (nontender at lower chest CC) Abdomen: soft, nontender (minimal chronic tenderness in epigastrium), no organomegaly or mass Extremities: no edema. Negative phalen. Normal sensation. Psych: normal mood, affect, hygiene and grooming    ASSESSMENT/PLAN:  Fatigue, unspecified type - unclear etiology, sudden onset after episode of CP/dyspnea 5 days ago; improving. Ddx reviewed. Check labs - Plan: Comprehensive metabolic panel, CBC with Differential/Platelet, VITAMIN D 25 Hydroxy (Vit-D Deficiency, Fractures), TSH  Vitamin D deficiency - hasn't been compliant with daily supplement. Recheck level - Plan: VITAMIN D 25 Hydroxy (Vit-D Deficiency, Fractures)  Type 2 diabetes mellitus with stage 2 chronic kidney disease, without long-term current use of insulin - improved control of DM, on ozempic. Under care of endo - Plan:  Comprehensive metabolic panel, TSH  Medication monitoring encounter - Plan: Comprehensive metabolic panel, CBC with Differential/Platelet, VITAMIN D 25 Hydroxy (Vit-D Deficiency, Fractures)  Chest pain, unspecified type - unclear etiology for acute CP 5d ago. Reports CP resolved; does have some MSK component, with tenderness at CC L>R  Cbc, c-met (nonfasting), TSH, D,   I spent 36 minutes dedicated to the care of this patient, including pre-visit review of records, face to face time, post-visit ordering of testing and documentation.

## 2022-10-07 LAB — CBC WITH DIFFERENTIAL/PLATELET
Basophils Absolute: 0 10*3/uL (ref 0.0–0.2)
Basos: 0 %
EOS (ABSOLUTE): 0 10*3/uL (ref 0.0–0.4)
Eos: 0 %
Hematocrit: 35.9 % (ref 34.0–46.6)
Hemoglobin: 12.1 g/dL (ref 11.1–15.9)
Immature Grans (Abs): 0 10*3/uL (ref 0.0–0.1)
Immature Granulocytes: 1 %
Lymphocytes Absolute: 0.9 10*3/uL (ref 0.7–3.1)
Lymphs: 11 %
MCH: 29.6 pg (ref 26.6–33.0)
MCHC: 33.7 g/dL (ref 31.5–35.7)
MCV: 88 fL (ref 79–97)
Monocytes Absolute: 0.2 10*3/uL (ref 0.1–0.9)
Monocytes: 3 %
Neutrophils Absolute: 6.8 10*3/uL (ref 1.4–7.0)
Neutrophils: 85 %
Platelets: 358 10*3/uL (ref 150–450)
RBC: 4.09 x10E6/uL (ref 3.77–5.28)
RDW: 13.3 % (ref 11.7–15.4)
WBC: 8 10*3/uL (ref 3.4–10.8)

## 2022-10-07 LAB — COMPREHENSIVE METABOLIC PANEL
ALT: 25 IU/L (ref 0–32)
AST: 18 IU/L (ref 0–40)
Albumin/Globulin Ratio: 1.6 (ref 1.2–2.2)
Albumin: 4.6 g/dL (ref 3.8–4.9)
Alkaline Phosphatase: 89 IU/L (ref 44–121)
BUN/Creatinine Ratio: 15 (ref 9–23)
BUN: 18 mg/dL (ref 6–24)
Bilirubin Total: 0.9 mg/dL (ref 0.0–1.2)
CO2: 25 mmol/L (ref 20–29)
Calcium: 10.4 mg/dL — ABNORMAL HIGH (ref 8.7–10.2)
Chloride: 103 mmol/L (ref 96–106)
Creatinine, Ser: 1.18 mg/dL — ABNORMAL HIGH (ref 0.57–1.00)
Globulin, Total: 2.9 g/dL (ref 1.5–4.5)
Glucose: 143 mg/dL — ABNORMAL HIGH (ref 70–99)
Potassium: 4.5 mmol/L (ref 3.5–5.2)
Sodium: 144 mmol/L (ref 134–144)
Total Protein: 7.5 g/dL (ref 6.0–8.5)
eGFR: 55 mL/min/{1.73_m2} — ABNORMAL LOW (ref >59)

## 2022-10-07 LAB — VITAMIN D 25 HYDROXY (VIT D DEFICIENCY, FRACTURES): Vit D, 25-Hydroxy: 32.9 ng/mL (ref 30.0–100.0)

## 2022-10-07 LAB — TSH: TSH: 0.836 u[IU]/mL (ref 0.450–4.500)

## 2022-10-14 DIAGNOSIS — R7989 Other specified abnormal findings of blood chemistry: Secondary | ICD-10-CM | POA: Diagnosis not present

## 2022-10-20 ENCOUNTER — Telehealth: Payer: Self-pay

## 2022-10-20 NOTE — Telephone Encounter (Signed)
I have no idea what this is in reference to

## 2022-10-20 NOTE — Telephone Encounter (Signed)
Pt's nurse health coach, Ronny Bacon [phone], has been unable to reach the pt. Number was provided in case you will need to contact her to facilitate treatment.

## 2022-10-20 NOTE — Telephone Encounter (Signed)
I called patient, she is pretty sure this is someone through her [employer]. She said she found the missed call on her phone and she will call her back now.

## 2022-10-25 DIAGNOSIS — S336XXA Sprain of sacroiliac joint, initial encounter: Secondary | ICD-10-CM | POA: Diagnosis not present

## 2022-10-25 DIAGNOSIS — M9903 Segmental and somatic dysfunction of lumbar region: Secondary | ICD-10-CM | POA: Diagnosis not present

## 2022-10-25 DIAGNOSIS — M62830 Muscle spasm of back: Secondary | ICD-10-CM | POA: Diagnosis not present

## 2022-10-25 DIAGNOSIS — M9904 Segmental and somatic dysfunction of sacral region: Secondary | ICD-10-CM | POA: Diagnosis not present

## 2022-10-26 DIAGNOSIS — S336XXA Sprain of sacroiliac joint, initial encounter: Secondary | ICD-10-CM | POA: Diagnosis not present

## 2022-10-26 DIAGNOSIS — M62830 Muscle spasm of back: Secondary | ICD-10-CM | POA: Diagnosis not present

## 2022-10-26 DIAGNOSIS — M9903 Segmental and somatic dysfunction of lumbar region: Secondary | ICD-10-CM | POA: Diagnosis not present

## 2022-10-26 DIAGNOSIS — M9904 Segmental and somatic dysfunction of sacral region: Secondary | ICD-10-CM | POA: Diagnosis not present

## 2022-10-27 DIAGNOSIS — S336XXA Sprain of sacroiliac joint, initial encounter: Secondary | ICD-10-CM | POA: Diagnosis not present

## 2022-10-27 DIAGNOSIS — M62830 Muscle spasm of back: Secondary | ICD-10-CM | POA: Diagnosis not present

## 2022-10-27 DIAGNOSIS — M9904 Segmental and somatic dysfunction of sacral region: Secondary | ICD-10-CM | POA: Diagnosis not present

## 2022-10-27 DIAGNOSIS — M9903 Segmental and somatic dysfunction of lumbar region: Secondary | ICD-10-CM | POA: Diagnosis not present

## 2022-11-02 ENCOUNTER — Ambulatory Visit: Payer: BC Managed Care – PPO | Admitting: Medical

## 2022-11-02 VITALS — BP 104/66 | HR 89 | Temp 98.6°F | Wt 169.2 lb

## 2022-11-02 DIAGNOSIS — H9202 Otalgia, left ear: Secondary | ICD-10-CM | POA: Diagnosis not present

## 2022-11-02 DIAGNOSIS — M542 Cervicalgia: Secondary | ICD-10-CM

## 2022-11-02 MED ORDER — TIZANIDINE HCL 4 MG PO TABS: 4.0000 mg | ORAL_TABLET | Freq: Three times a day (TID) | ORAL | 0 refills | Status: DC | PRN

## 2022-11-02 NOTE — Progress Notes (Signed)
Subjective:  Michelle Mack is a 57 y.o. female who presents for Chief Complaint  Patient presents with   left ear pain    Left ear pain, started yesterday. Getting sharp pains on the side of ear.      Here for complaint of left ear pain.  This started a few days ago.  It hurts in the left ear posteriorly into the left neck.  It would be a sudden sharp pain, comes at random throughout the day.  Just started a few days ago.  No recent injury or trauma.  She started seeing a chiropractor 3 weeks ago for low neck and back manipulation therapy.  She has been going on a couple times per week and last visit was 3 days ago.  She did not go Monday of this week given the new pain.  She has a little bit of a headache that started today.  Using nothing for the symptoms.  No recent unusual sleep position, no recent trauma or fall or injury.  No arm numbness or tingling or weakness.  No facial pain other than left ear pain posteriorly.  No recent dental procedure but she is due to go back to have some cavities filled on the right side soon.  No other aggravating or relieving factors.    No other c/o.  The following portions of the patient's history were reviewed and updated as appropriate: allergies, current medications, past family history, past medical history, past social history, past surgical history and problem list.  ROS Otherwise as in subjective above   Objective: BP 104/66   Pulse 89   Temp 98.6 F (37 C)   Wt 169 lb 3.2 oz (76.7 kg)   LMP 10/09/2015   BMI 29.04 kg/m   General appearance: alert, no distress, well developed, well nourished HEENT: normocephalic, sclerae anicteric, conjunctiva pink and moist, TMs pearly, nares patent, no discharge or erythema, pharynx normal Oral cavity: MMM, no lesions Neck: Tender left lateral neck and some pain with range of motion otherwise nontender supple, no lymphadenopathy, no thyromegaly, no masses Mild tenderness of left upper back  supraspinatus and rhomboid region Pulses: 2+ radial pulses, 2+ pedal pulses, normal cap refill Ext: no edema Neuro: CN II to XII intact, nonfocal exam     Assessment: Encounter Diagnoses  Name Primary?   Left ear pain Yes   Neck pain on left side      Plan: We discussed symptoms and concerns.  We discussed potential causes which could include muscle spasm, muscle strain, radicular pain, dental issue, trigeminal neuralgia or other.  No obvious signs of infection.  She seemed to have a few jolts of pain while she was seated in the room.  She also started chiropractor therapy recently, so it is possible therapy is aggravating her neck causing radicular pain.  Advise trial of muscle laxer as below for the next few days along with stretching, range of motion activity, Tylenol for pain over-the-counter as she is allergic to NSAIDs  She sees chiropractor again this week can discuss further as well  I am not 100% convinced that she has trigeminal neuralgia, however if the symptoms do not improve, we might need to consider additional workup for that condition and other treatment options  I asked her to call back within a week to let me know if seeing improvements or not with the muscle laxer and additional stretching and supportive therapy as discussed  Michelle Mack was seen today for left ear pain.  Diagnoses and all orders for this visit:  Left ear pain  Neck pain on left side  Other orders -     tiZANidine (ZANAFLEX) 4 MG tablet; Take 1 tablet (4 mg total) by mouth every 8 (eight) hours as needed for muscle spasms.    Follow up: call report within a week

## 2022-11-08 DIAGNOSIS — Z7985 Long-term (current) use of injectable non-insulin antidiabetic drugs: Secondary | ICD-10-CM | POA: Diagnosis not present

## 2022-11-08 DIAGNOSIS — E119 Type 2 diabetes mellitus without complications: Secondary | ICD-10-CM | POA: Diagnosis not present

## 2022-11-08 DIAGNOSIS — E1165 Type 2 diabetes mellitus with hyperglycemia: Secondary | ICD-10-CM | POA: Diagnosis not present

## 2022-11-08 DIAGNOSIS — E785 Hyperlipidemia, unspecified: Secondary | ICD-10-CM | POA: Diagnosis not present

## 2022-11-08 LAB — HEMOGLOBIN A1C: Hemoglobin A1C: 6.7

## 2022-11-08 LAB — LIPID PANEL: LDL Cholesterol: 58

## 2022-11-14 DIAGNOSIS — M0600 Rheumatoid arthritis without rheumatoid factor, unspecified site: Secondary | ICD-10-CM | POA: Insufficient documentation

## 2022-11-14 NOTE — Progress Notes (Unsigned)
No chief complaint on file.  Michelle Mack is a 57 y.o. female who presents for a complete physical.  She had GYN exam done 09/2022 with Dr. Estanislado Pandy. She has the following concerns:    Hypertension:  She is currently taking 5mg  amlodipine, and Benicar HCT 20/12.5   BP's are not checked elsewhere, just other doctors, and have been normal. She denies chest pain, palpitations, edema, dizziness, headaches., muscle cramps    shortness of breath with stairs??  BP Readings from Last 3 Encounters:  11/02/22 104/66  10/06/22 108/64  07/28/22 104/64    Diabetes:  She is under the care of Dr. Lou Miner (at [facility]). She was last seen earlier this month, A1c of 6.7% on ozempic 0.5mg  weekly, no longer on metformin.  Her ozempic dose was increased to 1mg . Sugars are running  She remains on chronic prednisone.   She sees podiatrist regularly, denies any foot concerns. Last eye exam was 04/2022, no retinopathy. (Dr. Dione Booze)  (She previously was prescribed Farxiga, stopped due to vaginitis and UTI. Also previously prescribed Tradjenta, stopped due to diarrhea.)  Hyperlipidemia:  She reports compliance with atorvastatin, denies side effects. She had cardiac cath in 07/2018 after hospitalized with chest pain.  She had normal coronary arteries.   Last lipids were at goal, done 11/08/22 at [facility]: Cholesterol, Total, Lipid Panel <200 mg/dL 161  Comment: NCEP III Guidelines  Total Cholesterol             Risk Classification                         <200 mg/dL                      Desirable   200-239 mg/dL                   Borderline High   >240 mg/dL                      High  Triglycerides, Lipid Panel <150 mg/dL 88  Comment: NCEP-ATP III Guidelines   Triglyceride Value           Risk Classification                         <150 mg/dL                   Normal   150-199 mg/dL                Borderline High   200-499 mg/dL                High   >=096 mg/dL                  Very High  HDL Cholesterol  - Lipid Panel >=60 mg/dL 55 Low   Comment: NCEP III Guidelines   HDLc                     Risk Classification                         >=60 mg/dL                  Optimal   >=40 mg/dL                  Desirable   <  40 mg/dL                   Low    LDL Cholesterol, Calculated <100 mg/dL 58  Comment: LDL Cholesterol is calculated using the Martin/Hopkins equation. Source:  JAMA 2013; 310:  2061-68. NCEP-ATP III Guidelines   LDLc                     Risk Classification                         <100 mg/dL                   Optimal   100-129 mg/dL                Desirable   130-159 mg/dL                Borderline High   160-189 mg/dL                High   >409 mg/dL                   Very High  Non-HDL Cholesterol mg/dL 75    H/o vitamin D deficiency: Last level was 32.9 in April 2024, when she had been out of her D3. Had been 50 in 09/2021 when taking D3 daily.   She is currently taking (Advised to start 07-1998 qd).   Tracheomalacia, relapsing polychondritis, chronic cough and h/o OSA.  She does note some slight increased DOE (with stairs).  She last had in-lab sleep study in 01/2022 which did not show OSA, just loud snoring.  Relapsing polychondritis, seronegative RA, OA, lumbar DDD--under the care of Dr. Nickola Major, managed with methotrexate and prednisone (currently on 8mg  daily). ***UPDATE 8 or 9?   Bilateral knee pain:  She has see Dr. Lajoyce Corners, last in 09/2022.  She declined L knee cortisone injection at that time. They had discussed possible L TKR when off prednisone, and recommended pre rehab therapy to strengthen knee prior to surgery.    Other doctors caring for patient: Endo: Dr. Lou Miner GYN: Dr. Clois Dupes Dr. Estanislado Pandy in 09/2022???? Uro-gyn: Dr. Florian Buff Rheum: Dr. Nickola Major Pulm:  Dr. Celine Mans [facility]) and NP Clent Ridges Podiatrist: Dr. Ardelle Anton GI: Dr. Loreta Ave Dentist: Dr. Pincus Badder Ophtho: Dr. Dione Booze Cardiology: Dr. Jacinto Halim (normal cath 07/2018) Nephro: Dr. Allena Katz at [facility]  Ortho: Dr.  Lajoyce Corners (knees) Plastic surgery: Dr. Arita Miss (had breast reduction and abdominoplasty consult 06/2021)--had mammo, needs to get blood sugars down.  Immunization History  Administered Date(s) Administered   Hepatitis A 12/27/2007, 01/24/2008, 07/17/2008   Hepatitis B 12/27/2007, 01/24/2008, 07/17/2008   Hepatitis B, PED/ADOLESCENT 12/27/2007, 01/24/2008, 07/17/2008   IPV 12/27/2007   MMR 12/27/2007   Meningococcal polysaccharide vaccine (MPSV4) 12/27/2007   PFIZER(Purple Top)SARS-COV-2 Vaccination 09/18/2019, 10/09/2019, 06/17/2020   PPD Test 04/14/2011, 06/08/2013, 01/01/2015, 03/08/2017   Td 12/28/1995   Tdap 12/27/2007, 04/11/2019   Typhoid Inactivated 12/27/2007   Varicella 12/27/2007   Yellow Fever 01/24/2008   She refuses flu shots, COVID boosters Last Pap smear: 05/2021, normal, no high risk HPV present Last mammogram: 09/2022, normal (L biopsy 09/2021--fibrocystic change with adenosis and calcification) Last colonoscopy: 12/2016 with Dr. Loreta Ave. Internal hemorrhoids, repeat 10 years Last DEXA: 07/2019 normal Dentist: goes yearly Ophtho: 04/2022 Dr. Dione Booze Exercise:   She walks 2 miles daily.  Occasionally goes to the gym.  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  PMH, PSH, SH and FH were reviewed and updated       ROS: The patient denies anorexia, headaches, decreased hearing, ear pain, sore throat, breast concerns, palpitations, dizziness, syncope, dyspnea on exertion, swelling, nausea, vomiting, diarrhea, abdominal pain, melena, hematochezia, indigestion/heartburn, hematuria, dysuria,vaginal bleeding, vaginal discharge, genital lesions, numbness, tingling, weakness, tremor, suspicious skin lesions, depression, anxiety, abnormal bleeding/bruising, or enlarged lymph nodes.  No dysuria. Chronic microscopic hematuria. L knee pain Some blurred vision, due for eye exam. +hot flashes, no menstrual cycles. "My normal pains"--muscle aches in legs, elsewhere, and also R  hand pain per HPI. Vaginal discharge Denies urinary complaints.  Odor is constant, discharge is intermittent.  UPDATE ALL   PHYSICAL EXAM:  LMP 10/09/2015   Wt Readings from Last 3 Encounters:  11/02/22 169 lb 3.2 oz (76.7 kg)  10/06/22 170 lb 6.4 oz (77.3 kg)  09/06/22 167 lb (75.8 kg)  Wt 164#, 12.8 oz in 05/2020  General Appearance:    Alert, cooperative, no distress, appears stated age  Head:    Normocephalic, without obvious abnormality, atraumatic  Eyes:    PERRL, conjunctiva/corneas clear, EOM's intact, fundi not well visualized. Slight proptotic appearance of eyes, chronic  Ears:    Normal TM's and external ear canals.  Nose:   No drainage or sinus tenderness  Throat:   OP clear with normal mucosa  Neck:   Supple, no lymphadenopathy;  thyroid:  no enlargement/ tenderness/nodules; no carotid bruit or JVD  Back:    Spine nontender, no curvature, ROM normal, no CVA tenderness  Lungs:     Clear to auscultation bilaterally without wheezes, rales or ronchi; respirations unlabored  Chest Wall:    No deformity, nontender   Heart:    Regular rate and rhythm, S1 and S2 normal, no murmur, rub or gallop  Breast Exam:    Deferred to GYN  Abdomen:     Soft, non-tender, nondistended, normoactive bowel sounds, no masses, no hepatosplenomegaly  Genitalia:    deferred to GYN  Rectal:    deferred to GYN  Extremities:   No clubbing, cyanosis or edema.  +bunions bilaterally, and callous at R>L medial great toes. Normal monofilament exam.  Pulses:   2+ and symmetric all extremities  Skin:   Skin color, texture, turgor normal, no rashes or lesions  Lymph nodes:   Cervical, supraclavicular and inguinal nodes normal  Neurologic:   Normal strength, sensation and gait; reflexes 2+ and symmetric throughout                                Psych:   Normal mood, affect, hygiene and grooming    ***update feet--bunions, callous R>L great toes? DIABETIC  FOOT EXAM***   ASSESSMENT/PLAN:   Notes from  Dr. Estanislado Pandy from 09/2022?   A1c and LDL needs to be abstracted from [facility] (can also see in labs, but isn't checking off the care gaps).   Prevnar-20, COVID booster and Shingrix are all recommended (prev has refused all of them). See which she is willing to have, if any, wouldn't give more than 1  No labs needed--Sept urinary stuff is due, but will be due to see endo then, and they do it.  DIABETIC FOOT EXAM  Discussed monthly self breast exams and yearly mammograms; at least 30 minutes of aerobic activity at least 5 days/week, weight-bearing exercise at least 2x/wk; proper sunscreen use reviewed; healthy diet, including goals of calcium and vitamin D  intake and alcohol recommendations (less than or equal to 1 drink/day) reviewed; regular seatbelt use; changing batteries in smoke detectors, recommend carbon monoxide detectors. Immunization recommendations discussed in detail--recommended flu shots yearly (she refuses).   Pneumonia vaccine is recommended due to her DM (and underlying pulm issues). Have recommended this last year as well, pt declined. Counseled regarding and encouraged Shingrix, declined. COVID bivalent booster was recommended, declined. Colonoscopy recommendations reviewed, UTD, due again 2028. Consider DEXA 2026.   F/u here 1 year for CPE. Continue routine f/u with her specialists.

## 2022-11-14 NOTE — Patient Instructions (Incomplete)
  HEALTH MAINTENANCE RECOMMENDATIONS:  It is recommended that you get at least 30 minutes of aerobic exercise at least 5 days/week (for weight loss, you may need as much as 60-90 minutes). This can be any activity that gets your heart rate up. This can be divided in 10-15 minute intervals if needed, but try and build up your endurance at least once a week.  Weight bearing exercise is also recommended twice weekly.  Eat a healthy diet with lots of vegetables, fruits and fiber.  "Colorful" foods have a lot of vitamins (ie green vegetables, tomatoes, red peppers, etc).  Limit sweet tea, regular sodas and alcoholic beverages, all of which has a lot of calories and sugar.  Up to 1 alcoholic drink daily may be beneficial for women (unless trying to lose weight, watch sugars).  Drink a lot of water.  Calcium recommendations are 1200-1500 mg daily (1500 mg for postmenopausal women or women without ovaries), and vitamin D 1000 IU daily.  This should be obtained from diet and/or supplements (vitamins), and calcium should not be taken all at once, but in divided doses.  Monthly self breast exams and yearly mammograms for women over the age of 90 is recommended.  Sunscreen of at least SPF 30 should be used on all sun-exposed parts of the skin when outside between the hours of 10 am and 4 pm (not just when at beach or pool, but even with exercise, golf, tennis, and yard work!)  Use a sunscreen that says "broad spectrum" so it covers both UVA and UVB rays, and make sure to reapply every 1-2 hours.  Remember to change the batteries in your smoke detectors when changing your clock times in the spring and fall. Carbon monoxide detectors are recommended for your home.  Use your seat belt every time you are in a car, and please drive safely and not be distracted with cell phones and texting while driving.  You declined vaccines today.  These are the ones that we discussed and are recommended:  Pneumonia vaccine is  recommended (due to your underlying breating issues and having diabetes). Flu shots are recommended yearly. COVID booster is also recommended.  I recommend getting the new shingles vaccine (Shingrix). Schedule a nurse visit at our office when convenient (based on the possible side effects as discussed).   This is a series of 2 injections, spaced 2 months apart.  It doesn't have to be exactly 2 months apart (but can't be sooner), if that isn't feasible for your schedule, but try and get them close to 2 months (and definitely within 6 months of each other, or else the efficacy of the vaccine drops off). This should be separated from other vaccines by at least 2 weeks.  We are treating you presumptively for a yeast infection with the Terazol-7. If your symptoms do not improve (vaginal itching), then please return (either here or your GYN) for a full evaluation of the itching and discharge, which wasn't done today.

## 2022-11-15 ENCOUNTER — Encounter: Payer: Self-pay | Admitting: Family Medicine

## 2022-11-15 ENCOUNTER — Ambulatory Visit: Payer: BC Managed Care – PPO | Admitting: Family Medicine

## 2022-11-15 ENCOUNTER — Encounter: Payer: Self-pay | Admitting: *Deleted

## 2022-11-15 VITALS — BP 110/68 | HR 72 | Ht 64.0 in | Wt 173.4 lb

## 2022-11-15 DIAGNOSIS — M1710 Unilateral primary osteoarthritis, unspecified knee: Secondary | ICD-10-CM

## 2022-11-15 DIAGNOSIS — E1122 Type 2 diabetes mellitus with diabetic chronic kidney disease: Secondary | ICD-10-CM

## 2022-11-15 DIAGNOSIS — E559 Vitamin D deficiency, unspecified: Secondary | ICD-10-CM

## 2022-11-15 DIAGNOSIS — N182 Chronic kidney disease, stage 2 (mild): Secondary | ICD-10-CM

## 2022-11-15 DIAGNOSIS — M941 Relapsing polychondritis: Secondary | ICD-10-CM

## 2022-11-15 DIAGNOSIS — Z6829 Body mass index (BMI) 29.0-29.9, adult: Secondary | ICD-10-CM

## 2022-11-15 DIAGNOSIS — J398 Other specified diseases of upper respiratory tract: Secondary | ICD-10-CM

## 2022-11-15 DIAGNOSIS — Z7185 Encounter for immunization safety counseling: Secondary | ICD-10-CM

## 2022-11-15 DIAGNOSIS — Z0000 Encounter for general adult medical examination without abnormal findings: Secondary | ICD-10-CM | POA: Diagnosis not present

## 2022-11-15 DIAGNOSIS — E7800 Pure hypercholesterolemia, unspecified: Secondary | ICD-10-CM

## 2022-11-15 DIAGNOSIS — M0600 Rheumatoid arthritis without rheumatoid factor, unspecified site: Secondary | ICD-10-CM

## 2022-11-15 DIAGNOSIS — N898 Other specified noninflammatory disorders of vagina: Secondary | ICD-10-CM

## 2022-11-15 DIAGNOSIS — I10 Essential (primary) hypertension: Secondary | ICD-10-CM | POA: Diagnosis not present

## 2022-11-15 LAB — POCT URINALYSIS DIP (PROADVANTAGE DEVICE)
Bilirubin, UA: NEGATIVE
Glucose, UA: NEGATIVE mg/dL
Ketones, POC UA: NEGATIVE mg/dL
Leukocytes, UA: NEGATIVE
Nitrite, UA: NEGATIVE
Protein Ur, POC: NEGATIVE mg/dL
Specific Gravity, Urine: 1.02
Urobilinogen, Ur: 0.2
pH, UA: 6 (ref 5.0–8.0)

## 2022-11-15 MED ORDER — TERCONAZOLE 0.4 % VA CREA
1.0000 | TOPICAL_CREAM | Freq: Every day | VAGINAL | 0 refills | Status: DC
Start: 2022-11-15 — End: 2023-02-09

## 2022-11-24 ENCOUNTER — Encounter: Payer: Self-pay | Admitting: *Deleted

## 2022-11-24 ENCOUNTER — Other Ambulatory Visit: Payer: Self-pay | Admitting: Internal Medicine

## 2022-11-24 ENCOUNTER — Other Ambulatory Visit: Payer: Self-pay | Admitting: Family Medicine

## 2022-11-24 DIAGNOSIS — N898 Other specified noninflammatory disorders of vagina: Secondary | ICD-10-CM

## 2022-11-24 DIAGNOSIS — M941 Relapsing polychondritis: Secondary | ICD-10-CM

## 2022-11-24 DIAGNOSIS — E7800 Pure hypercholesterolemia, unspecified: Secondary | ICD-10-CM

## 2022-11-24 NOTE — Telephone Encounter (Signed)
Left message asking patient if she needed Lipitor refill and also to find out of vaginal cream was an auto refill or of she requested this.

## 2022-11-24 NOTE — Telephone Encounter (Signed)
Last I heard her rheumatologist was managing this medication - I am not sure what dose she is supposed to be taking. When we saw her six months ago she was on 50 mg weekly. Would defer to Dr. Nickola Major on this.

## 2022-11-24 NOTE — Telephone Encounter (Signed)
Dr. Celine Mans, please advise on dose prior to sending refill to the pharmacy that all is correct with the Rx.

## 2022-11-25 NOTE — Telephone Encounter (Signed)
Message sent in Rx to have pharmacy defer medication refill to Dr. Zenovia Jordan pt's rheumatologist.

## 2022-12-14 ENCOUNTER — Other Ambulatory Visit: Payer: Self-pay

## 2022-12-14 DIAGNOSIS — M25561 Pain in right knee: Secondary | ICD-10-CM | POA: Diagnosis not present

## 2022-12-14 DIAGNOSIS — E7800 Pure hypercholesterolemia, unspecified: Secondary | ICD-10-CM

## 2022-12-14 DIAGNOSIS — M25562 Pain in left knee: Secondary | ICD-10-CM | POA: Diagnosis not present

## 2022-12-14 DIAGNOSIS — I10 Essential (primary) hypertension: Secondary | ICD-10-CM

## 2022-12-14 MED ORDER — ATORVASTATIN CALCIUM 20 MG PO TABS
20.0000 mg | ORAL_TABLET | Freq: Every day | ORAL | 1 refills | Status: DC
Start: 2022-12-14 — End: 2023-05-12

## 2022-12-14 MED ORDER — OLMESARTAN MEDOXOMIL-HCTZ 20-12.5 MG PO TABS: 1.0000 | ORAL_TABLET | Freq: Every day | ORAL | 1 refills | Status: DC

## 2022-12-14 MED ORDER — AMLODIPINE BESYLATE 5 MG PO TABS
5.0000 mg | ORAL_TABLET | Freq: Every day | ORAL | 1 refills | Status: DC
Start: 2022-12-14 — End: 2023-05-12

## 2022-12-17 DIAGNOSIS — M1991 Primary osteoarthritis, unspecified site: Secondary | ICD-10-CM | POA: Diagnosis not present

## 2022-12-17 DIAGNOSIS — M5136 Other intervertebral disc degeneration, lumbar region: Secondary | ICD-10-CM | POA: Diagnosis not present

## 2022-12-17 DIAGNOSIS — M941 Relapsing polychondritis: Secondary | ICD-10-CM | POA: Diagnosis not present

## 2022-12-17 DIAGNOSIS — M0609 Rheumatoid arthritis without rheumatoid factor, multiple sites: Secondary | ICD-10-CM | POA: Diagnosis not present

## 2023-01-21 DIAGNOSIS — M170 Bilateral primary osteoarthritis of knee: Secondary | ICD-10-CM | POA: Diagnosis not present

## 2023-02-01 DIAGNOSIS — M170 Bilateral primary osteoarthritis of knee: Secondary | ICD-10-CM | POA: Diagnosis not present

## 2023-02-08 DIAGNOSIS — M170 Bilateral primary osteoarthritis of knee: Secondary | ICD-10-CM | POA: Diagnosis not present

## 2023-02-09 ENCOUNTER — Encounter: Payer: Self-pay | Admitting: Family Medicine

## 2023-02-09 ENCOUNTER — Ambulatory Visit: Payer: BC Managed Care – PPO | Admitting: Family Medicine

## 2023-02-09 VITALS — BP 98/60 | HR 88 | Temp 98.7°F | Ht 64.0 in | Wt 168.0 lb

## 2023-02-09 DIAGNOSIS — G44209 Tension-type headache, unspecified, not intractable: Secondary | ICD-10-CM | POA: Diagnosis not present

## 2023-02-09 DIAGNOSIS — M542 Cervicalgia: Secondary | ICD-10-CM

## 2023-02-09 MED ORDER — TIZANIDINE HCL 4 MG PO TABS
4.0000 mg | ORAL_TABLET | Freq: Three times a day (TID) | ORAL | 0 refills | Status: DC | PRN
Start: 2023-02-09 — End: 2023-03-17

## 2023-02-09 NOTE — Patient Instructions (Signed)
Your headache seems muscular. Try ice and heat as needed. This may be coming from your neck, and hopefully will improve with physical therapy. Take the tizanidine more regularly, as needed for neck pain or headache (not just for your back).  Use cautiously if it makes you sleepy (don't drive). You can try SalonPas patches with lidocaine for the neck pain. You can also try Voltaren gel (at the temples, and on your neck--this is an anti-inflammatory that is topical, so bypasses your kidneys). You may continue the Tylenol as needed for pain.

## 2023-02-09 NOTE — Progress Notes (Signed)
Chief Complaint  Patient presents with   Headache    Patient has had HA since Friday and neck pain x 2 months. No respiratory symptoms. She wonders if the neck pain and HA are related. Taking tylenol for the HA, helps for a little while and then the HA comes back.    She has pain at the whole front of the head, around to the temples and the back of a head.  Like squeezing pain around head. HA started 8/2, has been constant. She has been taking Tylenol, help temporarily.    She has been having left-sided neck pain x 2 months. She has been using ice/heat on the neck, hasn't been helping. She thought it was crick in her neck, but hasn't gone away.  No radiation of pain to the L arm.  Denies numbness/tingling, weakness of LUE.  No trauma prior to onset of neck pain.  Has tizanidine, using as needed for back pain only, not for neck or headache.. Last took it last Wednesday (prior to onset of HA).  PMH, PSH, SH reviewed  Prednisone 7mg  (no recent change, for about 6 mos)  Outpatient Encounter Medications as of 02/09/2023  Medication Sig Note   acetaminophen (TYLENOL) 500 MG tablet Take 500 mg by mouth every 6 (six) hours as needed. 02/09/2023: Took 2 this am   amLODipine (NORVASC) 5 MG tablet Take 1 tablet (5 mg total) by mouth daily.    atorvastatin (LIPITOR) 20 MG tablet Take 1 tablet (20 mg total) by mouth daily.    Cholecalciferol (VITAMIN D3) 50 MCG (2000 UT) capsule TAKE 1 CAPSULE BY MOUTH EVERY DAY    folic acid (FOLVITE) 1 MG tablet Take 1 tablet (1 mg total) by mouth daily.    metFORMIN (GLUCOPHAGE-XR) 500 MG 24 hr tablet Take 1 tablet by mouth 2 (two) times daily with a meal.    methotrexate (RHEUMATREX) 5 MG tablet Take 5 tablets (25 mg total) by mouth once a week. Caution:Chemotherapy. Protect from light. (Patient taking differently: Take 50 mg by mouth once a week. Caution:Chemotherapy. Protect from light.)    olmesartan-hydrochlorothiazide (BENICAR HCT) 20-12.5 MG tablet Take 1  tablet by mouth daily.    OZEMPIC, 1 MG/DOSE, 4 MG/3ML SOPN Inject 1 mg into the skin once a week.    predniSONE (DELTASONE) 1 MG tablet Take 2 mg by mouth daily with breakfast.    predniSONE (DELTASONE) 5 MG tablet TAKE 2 TABLETS BY MOUTH DAILY WITH BREAKFAST. (Patient taking differently: Take 5 mg by mouth daily with breakfast.)    fluticasone (FLONASE) 50 MCG/ACT nasal spray Place 2 sprays into both nostrils daily. (Patient not taking: Reported on 11/15/2022) 02/09/2023: As needed   tiZANidine (ZANAFLEX) 4 MG tablet Take 1 tablet (4 mg total) by mouth every 8 (eight) hours as needed for muscle spasms. (Patient not taking: Reported on 11/15/2022) 02/09/2023: As needed   [DISCONTINUED] OZEMPIC, 0.25 OR 0.5 MG/DOSE, 2 MG/3ML SOPN Inject into the skin. 11/15/2022: Dose is being increased to 1 mg this coming Friday   [DISCONTINUED] terconazole (TERAZOL 7) 0.4 % vaginal cream Place 1 applicator vaginally at bedtime.    No facility-administered encounter medications on file as of 02/09/2023.   Allergies  Allergen Reactions   Ampicillin Shortness Of Breath   Penicillins Shortness Of Breath and Swelling        Sulfamethoxazole-Trimethoprim Other (See Comments)    "body on fire"   Cephalosporins Itching   Elemental Sulfur Other (See Comments)    Feeling of  burning on the inside  Persist ant cough   Mobic [Meloxicam] Swelling and Rash    Pt states lips also swelled.   Nitrofuran Derivatives Itching    Throat, mouth, hands itch    ROS: no f/c, vomiting.  She has had some diarrhea (thinks due to metformin), and slight nausea today. No light sensitivity or sound sensitivity. No numbness, tingling, weakness.  HA and L neck pain per HPI. No chest pain, shortness of breath, urinary complaints or other concerns. See HPI.    PHYSICAL EXAM:  BP 98/60   Pulse 88   Temp 98.7 F (37.1 C) (Tympanic)   Ht 5\' 4"  (1.626 m)   Wt 168 lb (76.2 kg)   LMP 10/09/2015   BMI 28.84 kg/m   Pleasant,  well-appearing female in no distress HEENT: conjunctiva and sclera are clear, EOMI. TM's and EAC's normal. Tender to palpation at bilateral temporalis muscles, and diffusely on scalp. No focal temporal artery tenderness (just diffuse). No sinus tenderness Nasal mucosa is normal. OP is clear Tender at C-spine and along the paraspinous muscles on the left. No lymphadenopathy or mass. Also tender at the occiput (base) on the left only. Back: no spinal or CVA tenderness Neuro: alert and oriented, cranial nerves intact. Normal strength, gait, sensation, DTR's, finger to nose.    ASSESSMENT/PLAN:  Neck pain on left side - shown stretches; heat, massage. refer for PT. Voltaren gel, SalonPas, tylenol as needed for pain. - Plan: Ambulatory referral to Physical Therapy, tiZANidine (ZANAFLEX) 4 MG tablet  Tension headache - reassured normal neuro exam. Supportive measures reviewed.  PT - Plan: Ambulatory referral to Physical Therapy, tiZANidine (ZANAFLEX) 4 MG tablet  Refer to PT Shown stretches. Risks/SE of meds reviewed.  Your headache seems muscular. Try ice and heat as needed. This may be coming from your neck, and hopefully will improve with physical therapy. Take the tizanidine more regularly, as needed for neck pain or headache (not just for your back).  Use cautiously if it makes you sleepy (don't drive). You can try SalonPas patches with lidocaine for the neck pain. You can also try Voltaren gel (at the temples, and on your neck--this is an anti-inflammatory that is topical, so bypasses your kidneys). You may continue the Tylenol as needed for pain.

## 2023-02-11 ENCOUNTER — Other Ambulatory Visit: Payer: Self-pay | Admitting: Family Medicine

## 2023-02-11 DIAGNOSIS — N898 Other specified noninflammatory disorders of vagina: Secondary | ICD-10-CM

## 2023-02-11 DIAGNOSIS — E559 Vitamin D deficiency, unspecified: Secondary | ICD-10-CM

## 2023-02-14 ENCOUNTER — Ambulatory Visit: Payer: BC Managed Care – PPO | Admitting: Family Medicine

## 2023-02-22 NOTE — Therapy (Unsigned)
OUTPATIENT PHYSICAL THERAPY CERVICAL EVALUATION   Patient Name: Michelle Mack MRN: [id number] DOB:21-Nov-1965, 57 y.o., female Today's Date: 02/23/2023  END OF SESSION:  PT End of Session - 02/23/23 1106     Visit Number 1    Number of Visits 16    Date for PT Re-Evaluation 04/20/23    Authorization Type BCBS    PT Start Time 1102    PT Stop Time 1150    PT Time Calculation (min) 48 min    Activity Tolerance Patient tolerated treatment well    Behavior During Therapy WFL for tasks assessed/performed             Past Medical History:  Diagnosis Date   Anemia    on iron   Back pain    tx with ibuprofen 800mg    Bronchitis    treated recently by abx   Cough    non-productive   DUB (dysfunctional uterine bleeding)    Fibroid uterus    GYN--Dr. Rivard   GERD (gastroesophageal reflux disease)    Hypertension    Infertility, female    OA (osteoarthritis) of knee 04/02/2020   Mod osteoarthritis, medial compartment of L knee, per x-rays at [facility].  Cortisone injection by Dr. Cleophas Dunker 03/05/20   Polychondritis    Recurrent upper respiratory infection (URI)    in October - tx with abx   Type II or unspecified type diabetes mellitus without mention of complication, not stated as uncontrolled    diagnosed by Dr. Loleta Chance   Past Surgical History:  Procedure Laterality Date   BRONCHOSCOPY  01/02/2014   at [facility] expiratory collapse of trachea and focal narrowing in L mainstem bronchus and LUL   ENDOMETRIAL VAPORIZATION W/ VERSAPOINT     LEFT HEART CATH AND CORONARY ANGIOGRAPHY N/A 07/19/2018   Procedure: LEFT HEART CATH AND CORONARY ANGIOGRAPHY;  Surgeon: Elder Negus, MD;  Location: [facility] INVAS[facility] CV LAB;  Service: Cardiovascular;  Laterality: N/A;   MYOMECTOMY  07/05/2000   SUPRACERVICAL ABDOMINAL HYSTERECTOMY     still has ovaries   Patient Active Problem List   Diagnosis Date Noted   Seronegative rheumatoid arthritis (HCC) 11/14/2022   Acute sore  throat 06/22/2022   Nonproliferative diabetic retinopathy of both eyes without macular edema (HCC) 11/26/2020   Atrophic vaginitis 10/31/2020   Bronchitis 10/31/2020   Colon cancer screening 10/31/2020   Constipation 10/31/2020   External hemorrhoids 10/31/2020   Female stress incontinence 10/31/2020   Flatulence, eructation and gas pain 10/31/2020   Hemorrhage of anus and rectum 10/31/2020   Iron deficiency anemia 10/31/2020   Irritable bowel syndrome 10/31/2020   Epigastric pain 10/31/2020   Sacral back pain 10/31/2020   OA (osteoarthritis) of knee 04/02/2020   Unstable angina (HCC) 07/19/2018   Chest wall pain 07/18/2018   Polychondritis 07/18/2018   Type 2 diabetes mellitus with hyperglycemia (HCC) 07/18/2018   Hypercholesteremia 07/18/2018   On prednisone therapy 05/02/2017   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   Fibroid, uterine 11/13/2015   Mediastinal adenopathy 10/23/2015   Collapse of left lung 10/23/2015   Relapsing polychondritis 10/23/2015   High risk medication use 07/14/2015   Cellulitis of back except buttock 06/13/2015   Type 2 diabetes mellitus, controlled, with renal complications (HCC) 03/12/2015   CKD (chronic kidney disease) stage 2, GFR 60-89 ml/min 11/06/2014   Essential hypertension 05/22/2014   Chronic polychondritis 05/02/2014   Dysfunctional uterine bleeding 04/29/2014   OSA (obstructive sleep apnea) 04/29/2014   Other specified respiratory disorders 04/29/2014  PND (paroxysmal nocturnal dyspnea) 04/29/2014   Microscopic hematuria 04/12/2014   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/26/2014   Positive ANA (antinuclear antibody) 02/26/2014   Wheezing 09/14/2013   Vitamin D deficiency 07/31/2013   Obesity (BMI 30-39.9)    Impaired fasting glucose 05/24/2012   Fibroid uterus 05/24/2012   Infertility, female    DUB (dysfunctional uterine bleeding)    Chronic cough 05/25/2011   GERD (gastroesophageal reflux disease) 05/25/2011   Anemia 05/25/2011   Essential hypertension,  benign 04/05/2011    PCP: Joselyn Arrow MD   REFERRING PROVIDER: Joselyn Arrow, MD  REFERRING DIAG:  Diagnosis  M54.2 (ICD-10-CM) - Neck pain on left side  G44.209 (ICD-10-CM) - Tension headache    THERAPY DIAG:  Cervicalgia  Stiffness of cervical spine  Rationale for Evaluation and Treatment: Rehabilitation  ONSET DATE: 1 month   SUBJECTIVE:                                                                                                                                                                                                         SUBJECTIVE STATEMENT: I thought it was a crick in the neck.  It has been going on for 1-2 month.  No imaging done.  She endorses stiffness, pain in L shoulder but not into her lower arm and hand. She was seeing a chiropractor and thinks maybe "that tool thing" hit a nerve.  The pain is constant and worse with rotation. The pain and symptoms interfere with work, use her arms for home tasks and ADLs. She denies weakness or sensory symptoms.  She has new headaches as well.  Pt has not tried PT before.    Hand dominance: Right  PERTINENT HISTORY:  Polychondritis Arthritis (OA and RA) Diabetes, HTN    PAIN:  Are you having pain? Yes: NPRS scale: 6/10 Pain location: L neck and L arm  Pain description: dull, aching  Aggravating factors: turning head  Relieving factors: laying down improves but hard to get back up , uses heat at times  PRECAUTIONS: None  RED FLAGS: None     WEIGHT BEARING RESTRICTIONS: No  FALLS:  Has patient fallen in last 6 months? No  LIVING ENVIRONMENT: Lives with: lives with their spouse Lives in: House/apartment Stairs:  did not ask Has following equipment at home: None  OCCUPATION: childcare  PLOF: Independent  PATIENT GOALS: Pain relief   NEXT MD VISIT: unknown to PT   OBJECTIVE:   DIAGNOSTIC FINDINGS:  None   PATIENT SURVEYS:  FOTO 51%  COGNITION: Overall cognitive status: Within functional  limits for  tasks assessed  SENSATION: WFL  POSTURE: rounded shoulders and forward head  PALPATION: Pain and spasm along L lateral cervicals, hypomobile segments throughout cervical spine, lateral gliding of mid cervical spine increased pain on L side.    CERVICAL ROM:   Active ROM A/PROM (deg) eval  Flexion 65 pain   Extension 60  Right lateral flexion 45  Left lateral flexion 43  Right rotation WFL  Left rotation 45  Patient had pain and difficulty returning to neutral from each of the positions especially rotation flexion and ext , needed to use hands    UPPER EXTREMITY AVW:UJWJ WNL   Active ROM Right eval Left eval  Shoulder flexion    Shoulder extension    Shoulder abduction    Shoulder adduction    Shoulder extension    Shoulder internal rotation    Shoulder external rotation    Elbow flexion    Elbow extension    Wrist flexion    Wrist extension    Wrist ulnar deviation    Wrist radial deviation    Wrist pronation    Wrist supination     (Blank rows = not tested)  UPPER EXTREMITY MMT:  MMT Right eval Left eval   Shoulder flexion 4 4-  Shoulder extension    Shoulder abduction 4 4-  Shoulder adduction    Shoulder extension    Shoulder internal rotation    Shoulder external rotation    Middle trapezius    Lower trapezius    Elbow flexion 5 5  Elbow extension    Wrist flexion    Wrist extension    Wrist ulnar deviation    Wrist radial deviation    Wrist pronation    Wrist supination    Grip strength     (Blank rows = not tested)  CERVICAL SPECIAL TESTS:  Distraction test: felt good to her  and NA  FUNCTIONAL TESTS:  NT on eval   TODAY'S TREATMENT:                                                                                                                              DATE: 02/23/23   PATIENT EDUCATION:  Education details: PT, POC, HEP, rolling in and out of bed vs lifting head up   Person educated: Patient Education method:  Programmer, multimedia, Facilities manager, Verbal cues, and Handouts Education comprehension: verbalized understanding, returned demonstration, and needs further education  HOME EXERCISE PROGRAM: Access Code: [email or web address] URL: [email or web address] Date: 02/23/2023 Prepared by: Karie Mainland  Exercises - Supine Cervical Retraction with Towel  - 1-2 x daily - 7 x weekly - 2 sets - 10 reps - 5 hold - Supine Cervical Rotation AROM on Pillow  - 1-2 x daily - 7 x weekly - 2 sets - 10 reps - 5 hold - Cervical Extension and Sidebending AROM with Strap  - 1-2 x daily - 7 x weekly - 2 sets - 10 reps - 10-15 hold -  Seated Assisted Cervical Rotation with Towel  - 1-2 x daily - 7 x weekly - 1 sets - 5 reps - 10-15 hold  ASSESSMENT:  CLINICAL IMPRESSION: Patient is a 57 y.o. female who was seen today for physical therapy evaluation and treatment for neck pain referring to proximal shoulder. Symptoms are not consistent with radiculopathy but more of a facet issue.  She has difficulty bringing her back into a neutral position, not a locking feeling but a spasm.  She did not tolerate much palpation directly to the L lateral neck but exercises did provide temporary relief.    OBJECTIVE IMPAIRMENTS: decreased mobility, decreased ROM, decreased strength, hypomobility, increased fascial restrictions, increased muscle spasms, impaired flexibility, impaired UE functional use, and pain.   ACTIVITY LIMITATIONS: carrying, lifting, sleeping, transfers, bed mobility, reach over head, hygiene/grooming, locomotion level, and caring for others  PARTICIPATION LIMITATIONS: cleaning, laundry, interpersonal relationship, driving, shopping, community activity, and occupation  PERSONAL FACTORS: 3+ comorbidities: chronic medical issues:OA, RA, polychrondritis, diabetes   are also affecting patient's functional outcome.   REHAB POTENTIAL: Excellent  CLINICAL DECISION MAKING: Stable/uncomplicated  EVALUATION COMPLEXITY:  Low   GOALS:  SHORT TERM GOALS: Target date: 03/16/2023      Pt will be I with initial HEP for cervical mobility , headaches  Baseline:  Goal status: INITIAL  2.  Pt will be in less pain when doing normal ADLs, improved 25%  Baseline:  Goal status: INITIAL    LONG TERM GOALS: Target date: 04/20/2023    Pt will be I with HEP upon discharge from PT for UE, cervical spine  Baseline:  Goal status: INITIAL  2.  Pt will be more mindful of posture and show proper lifting techniques for work  Baseline:  Goal status: INITIAL  3.  Pt will be able to demo normal AROM in cervical spine without difficulty returning to neutral Baseline: has to use her hands Goal status: INITIAL  4.  Pt will be able to complete housework and ADLs without increased neck pain  Baseline:  Goal status: INITIAL  5.  FOTO score will improve to 65% or better to demo functional outcome  Baseline:  Goal status: INITIAL   PLAN:  PT FREQUENCY: 2x/week  PT DURATION: 8 weeks 6-8 weeks  PLANNED INTERVENTIONS: Therapeutic exercises, Therapeutic activity, Neuromuscular re-education, Patient/Family education, Self Care, Joint mobilization, Dry Needling, Electrical stimulation, Spinal mobilization, Cryotherapy, Moist heat, Traction, Manual therapy, and Re-evaluation  PLAN FOR NEXT SESSION: check HEP, manual consider DN to cervicals, UT .  Modalities prn  postural strength, lifting    Oluwatomiwa Kinyon, PT 02/23/2023, 11:48 AM   Karie Mainland, PT 02/23/23 12:23 PM Phone: [phone] Fax: [phone]

## 2023-02-23 ENCOUNTER — Encounter: Payer: Self-pay | Admitting: Physical Therapy

## 2023-02-23 ENCOUNTER — Ambulatory Visit: Payer: BC Managed Care – PPO | Attending: Family Medicine | Admitting: Physical Therapy

## 2023-02-23 DIAGNOSIS — M542 Cervicalgia: Secondary | ICD-10-CM | POA: Insufficient documentation

## 2023-02-23 DIAGNOSIS — M436 Torticollis: Secondary | ICD-10-CM | POA: Insufficient documentation

## 2023-02-23 DIAGNOSIS — R252 Cramp and spasm: Secondary | ICD-10-CM | POA: Diagnosis not present

## 2023-02-23 DIAGNOSIS — G44209 Tension-type headache, unspecified, not intractable: Secondary | ICD-10-CM | POA: Insufficient documentation

## 2023-03-01 NOTE — Therapy (Addendum)
OUTPATIENT PHYSICAL THERAPY CERVICAL TREATMENT NOTE   Patient Name: Michelle Mack MRN: [id number] DOB:[birth year], 57 y.o., female Today's Date: 03/03/2023   PHYSICAL THERAPY DISCHARGE SUMMARY  Visits from Start of Care: 2  Current functional level related to goals / functional outcomes: See below   Remaining deficits: Unknown    Education / Equipment: See below    Patient agrees to discharge. Patient goals were not met. Patient is being discharged due to not returning since the last visit.   END OF SESSION:  PT End of Session - 03/03/23 1215     Visit Number 2    Number of Visits 16    Date for PT Re-Evaluation 04/20/23    Authorization Type BCBS    PT Start Time 1149    PT Stop Time 1235    PT Time Calculation (min) 46 min    Activity Tolerance Patient tolerated treatment well    Behavior During Therapy WFL for tasks assessed/performed              Past Medical History:  Diagnosis Date   Anemia    on iron   Back pain    tx with ibuprofen 800mg    Bronchitis    treated recently by abx   Cough    non-productive   DUB (dysfunctional uterine bleeding)    Fibroid uterus    GYN--Dr. Rivard   GERD (gastroesophageal reflux disease)    Hypertension    Infertility, female    OA (osteoarthritis) of knee 04/02/2020   Mod osteoarthritis, medial compartment of L knee, per x-rays at [facility].  Cortisone injection by Dr. Cleophas Dunker 03/05/20   Polychondritis    Recurrent upper respiratory infection (URI)    in October - tx with abx   Type II or unspecified type diabetes mellitus without mention of complication, not stated as uncontrolled    diagnosed by Dr. Loleta Chance   Past Surgical History:  Procedure Laterality Date   BRONCHOSCOPY  01/02/2014   at [facility] expiratory collapse of trachea and focal narrowing in L mainstem bronchus and LUL   ENDOMETRIAL VAPORIZATION W/ VERSAPOINT     LEFT HEART CATH AND CORONARY ANGIOGRAPHY N/A 07/19/2018   Procedure: LEFT  HEART CATH AND CORONARY ANGIOGRAPHY;  Surgeon: Elder Negus, MD;  Location: [facility] INVASIVE CV LAB;  Service: Cardiovascular;  Laterality: N/A;   MYOMECTOMY  07/05/2000   SUPRACERVICAL ABDOMINAL HYSTERECTOMY     still has ovaries   Patient Active Problem List   Diagnosis Date Noted   Seronegative rheumatoid arthritis (HCC) 11/14/2022   Acute sore throat 06/22/2022   Nonproliferative diabetic retinopathy of both eyes without macular edema (HCC) 11/26/2020   Atrophic vaginitis 10/31/2020   Bronchitis 10/31/2020   Colon cancer screening 10/31/2020   Constipation 10/31/2020   External hemorrhoids 10/31/2020   Female stress incontinence 10/31/2020   Flatulence, eructation and gas pain 10/31/2020   Hemorrhage of anus and rectum 10/31/2020   Iron deficiency anemia 10/31/2020   Irritable bowel syndrome 10/31/2020   Epigastric pain 10/31/2020   Sacral back pain 10/31/2020   OA (osteoarthritis) of knee 04/02/2020   Unstable angina (HCC) 07/19/2018   Chest wall pain 07/18/2018   Polychondritis 07/18/2018   Type 2 diabetes mellitus with hyperglycemia (HCC) 07/18/2018   Hypercholesteremia 07/18/2018   On prednisone therapy 05/02/2017   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   Fibroid, uterine 11/13/2015   Mediastinal adenopathy 10/23/2015   Collapse of left lung 10/23/2015   Relapsing polychondritis 10/23/2015   High risk  medication use 07/14/2015   Cellulitis of back except buttock 06/13/2015   Type 2 diabetes mellitus, controlled, with renal complications (HCC) 03/12/2015   CKD (chronic kidney disease) stage 2, GFR 60-89 ml/min 11/06/2014   Essential hypertension 05/22/2014   Chronic polychondritis 05/02/2014   Dysfunctional uterine bleeding 04/29/2014   OSA (obstructive sleep apnea) 04/29/2014   Other specified respiratory disorders 04/29/2014   PND (paroxysmal nocturnal dyspnea) 04/29/2014   Microscopic hematuria 04/12/2014   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/26/2014   Positive ANA (antinuclear  antibody) 02/26/2014   Wheezing 09/14/2013   Vitamin D deficiency 07/31/2013   Obesity (BMI 30-39.9)    Impaired fasting glucose 05/24/2012   Fibroid uterus 05/24/2012   Infertility, female    DUB (dysfunctional uterine bleeding)    Chronic cough 05/25/2011   GERD (gastroesophageal reflux disease) 05/25/2011   Anemia 05/25/2011   Essential hypertension, benign 04/05/2011    PCP: Joselyn Arrow MD   REFERRING PROVIDER: Joselyn Arrow, MD  REFERRING DIAG:  Diagnosis  M54.2 (ICD-10-CM) - Neck pain on left side  G44.209 (ICD-10-CM) - Tension headache    THERAPY DIAG:  Cervicalgia  Stiffness of cervical spine  Cramp and spasm  Rationale for Evaluation and Treatment: Rehabilitation  ONSET DATE: 1 month   SUBJECTIVE:                                                                                                                                                                                                         SUBJECTIVE STATEMENT: Pt reports her neck and L shoulder pain is about the same. She reports she is completing her HEP 1x daily. The exercises feel good when completing, but her neck bothers her more afterward.  EVAL: I thought it was a crick in the neck.  It has been going on for 1-2 month.  No imaging done.  She endorses stiffness, pain in L shoulder but not into her lower arm and hand. She was seeing a chiropractor and thinks maybe "that tool thing" hit a nerve.  The pain is constant and worse with rotation. The pain and symptoms interfere with work, use her arms for home tasks and ADLs. She denies weakness or sensory symptoms.  She has new headaches as well.  Pt has not tried PT before.    Hand dominance: Right   PAIN:  Are you having pain? Yes: NPRS scale: 6/10 Pain location: L neck and L arm  Pain description: dull, aching, Intermittent  Aggravating factors: turning head  Relieving factors: laying down improves but hard to get back up , uses heat  at  times  PERTINENT HISTORY:  Polychondritis Arthritis (OA and RA) Diabetes, HTN   PRECAUTIONS: None  RED FLAGS: None     WEIGHT BEARING RESTRICTIONS: No  FALLS:  Has patient fallen in last 6 months? No  LIVING ENVIRONMENT: Lives with: lives with their spouse Lives in: House/apartment Stairs:  did not ask Has following equipment at home: None  OCCUPATION: childcare  PLOF: Independent  PATIENT GOALS: Pain relief   NEXT MD VISIT: unknown to PT   OBJECTIVE:   DIAGNOSTIC FINDINGS:  None   PATIENT SURVEYS:  FOTO 51%  COGNITION: Overall cognitive status: Within functional limits for tasks assessed  SENSATION: WFL  POSTURE: rounded shoulders and forward head  PALPATION: Pain and spasm along L lateral cervicals, hypomobile segments throughout cervical spine, lateral gliding of mid cervical spine increased pain on L side.    CERVICAL ROM:   Active ROM A/PROM (deg) eval  Flexion 65 pain   Extension 60  Right lateral flexion 45  Left lateral flexion 43  Right rotation WFL  Left rotation 45  Patient had pain and difficulty returning to neutral from each of the positions especially rotation flexion and ext , needed to use hands    UPPER EXTREMITY NGE:XBMW WNL   Active ROM Right eval Left eval  Shoulder flexion    Shoulder extension    Shoulder abduction    Shoulder adduction    Shoulder extension    Shoulder internal rotation    Shoulder external rotation    Elbow flexion    Elbow extension    Wrist flexion    Wrist extension    Wrist ulnar deviation    Wrist radial deviation    Wrist pronation    Wrist supination     (Blank rows = not tested)  UPPER EXTREMITY MMT:  MMT Right eval Left eval   Shoulder flexion 4 4-  Shoulder extension    Shoulder abduction 4 4-  Shoulder adduction    Shoulder extension    Shoulder internal rotation    Shoulder external rotation    Middle trapezius    Lower trapezius    Elbow flexion 5 5  Elbow  extension    Wrist flexion    Wrist extension    Wrist ulnar deviation    Wrist radial deviation    Wrist pronation    Wrist supination    Grip strength     (Blank rows = not tested)  CERVICAL SPECIAL TESTS:  Distraction test: felt good to her  and NA  FUNCTIONAL TESTS:  NT on eval   TODAY'S TREATMENT:     [facility] Adult PT Treatment:                                                DATE: 02/2923 Therapeutic Exercise: Supine Cervical Retraction x10 5" Supine DNF lift offs x10 5" Supine shoulder hor abd 2x10 GTB Supine ER 2x10 GTB Cervical Extension with Strap 3-5 reps at 3 levels Seated Assisted Cervical Rotation with Towel  5 reps 10-15" to the R, L currently provokes pain  Updated HEP Manual Therapy:  STM c of the bilat upper traps and cervical paraspinals, MTPR of the L upper trap   Cervical UPAs C2-C6    Cervical traction  DATE: 02/23/23   PATIENT EDUCATION:  Education details: PT, POC, HEP, rolling in and out of bed vs lifting head up   Person educated: Patient Education method: Programmer, multimedia, Demonstration, Verbal cues, and Handouts Education comprehension: verbalized understanding, returned demonstration, and needs further education  HOME EXERCISE PROGRAM: Access Code: [email or web address] URL: [email or web address] Date: 03/03/2023 Prepared by: Joellyn Rued  Exercises - Supine Cervical Retraction with Towel  - 1-2 x daily - 7 x weekly - 2 sets - 10 reps - 5 hold - Supine DNF Liftoffs  - 1 x daily - 7 x weekly - 3 sets - 10 reps - 5 hold - Supine Shoulder Horizontal Abduction with Resistance  - 1 x daily - 7 x weekly - 2 sets - 10 reps - 2 hold - Supine Shoulder External Rotation on Foam Roll with Theraband  - 1 x daily - 7 x weekly - 3 sets - 10 reps - Supine Cervical Rotation AROM on Pillow  - 1-2 x daily - 7 x weekly - 2 sets - 10 reps - 5 hold - Seated Assisted Cervical Rotation with Towel  -  1-2 x daily - 7 x weekly - 1 sets - 5 reps - 10-15 hold - Cervical Extension AROM with Strap  - 1-2 x daily - 7 x weekly - 2 sets - 10 reps - 10-15 hold - Cervical Extension and Sidebending AROM with Strap  - 1-2 x daily - 7 x weekly - 2 sets - 10 reps - 10-15 hold  ASSESSMENT:  CLINICAL IMPRESSION: PT was completed for manual therapy for STM c MTPR, cervical UPAs, and for traction. Manual therapy was followed by therex for cervical mobility and postural/posterior chain strengthening. Lying in supine in a neutral position, pt reports she does not experience pain. Pt's L neck and shoulder pain is provoked by L cervical side bending or rotation. Therex was modified to assisted cervical ext in neutral and to avoid L cervical rotation. Discussed TPDN which the pt is considering. Pt tolerated PT today without adverse effects. Pt will continue to benefit from skilled PT to address impairments for improved function c minimized pain.   EVAL: Patient is a 57 y.o. female who was seen today for physical therapy evaluation and treatment for neck pain referring to proximal shoulder. Symptoms are not consistent with radiculopathy but more of a facet issue.  She has difficulty bringing her back into a neutral position, not a locking feeling but a spasm.  She did not tolerate much palpation directly to the L lateral neck but exercises did provide temporary relief.    OBJECTIVE IMPAIRMENTS: decreased mobility, decreased ROM, decreased strength, hypomobility, increased fascial restrictions, increased muscle spasms, impaired flexibility, impaired UE functional use, and pain.   ACTIVITY LIMITATIONS: carrying, lifting, sleeping, transfers, bed mobility, reach over head, hygiene/grooming, locomotion level, and caring for others  PARTICIPATION LIMITATIONS: cleaning, laundry, interpersonal relationship, driving, shopping, community activity, and occupation  PERSONAL FACTORS: 3+ comorbidities: chronic medical issues:OA, RA,  polychrondritis, diabetes   are also affecting patient's functional outcome.   REHAB POTENTIAL: Excellent  CLINICAL DECISION MAKING: Stable/uncomplicated  EVALUATION COMPLEXITY: Low   GOALS:  SHORT TERM GOALS: Target date: 03/16/2023      Pt will be I with initial HEP for cervical mobility , headaches  Baseline:  Goal status: INITIAL  2.  Pt will be in less pain when doing normal ADLs, improved 25%  Baseline:  Goal status: INITIAL    LONG TERM GOALS: Target date: 04/20/2023  Pt will be I with HEP upon discharge from PT for UE, cervical spine  Baseline:  Goal status: INITIAL  2.  Pt will be more mindful of posture and show proper lifting techniques for work  Baseline:  Goal status: INITIAL  3.  Pt will be able to demo normal AROM in cervical spine without difficulty returning to neutral Baseline: has to use her hands Goal status: INITIAL  4.  Pt will be able to complete housework and ADLs without increased neck pain  Baseline:  Goal status: INITIAL  5.  FOTO score will improve to 65% or better to demo functional outcome  Baseline:  Goal status: INITIAL   PLAN:  PT FREQUENCY: 2x/week  PT DURATION: 8 weeks 6-8 weeks  PLANNED INTERVENTIONS: Therapeutic exercises, Therapeutic activity, Neuromuscular re-education, Patient/Family education, Self Care, Joint mobilization, Dry Needling, Electrical stimulation, Spinal mobilization, Cryotherapy, Moist heat, Traction, Manual therapy, and Re-evaluation  PLAN FOR NEXT SESSION: check HEP, manual consider DN to cervicals, UT .  Modalities prn  postural strength, lifting    Joellyn Rued, PT 03/03/2023, 1:23 PM

## 2023-03-03 ENCOUNTER — Ambulatory Visit: Payer: BC Managed Care – PPO

## 2023-03-03 DIAGNOSIS — M542 Cervicalgia: Secondary | ICD-10-CM

## 2023-03-03 DIAGNOSIS — R252 Cramp and spasm: Secondary | ICD-10-CM

## 2023-03-03 DIAGNOSIS — M436 Torticollis: Secondary | ICD-10-CM

## 2023-03-03 DIAGNOSIS — G44209 Tension-type headache, unspecified, not intractable: Secondary | ICD-10-CM | POA: Diagnosis not present

## 2023-03-08 ENCOUNTER — Ambulatory Visit: Payer: BC Managed Care – PPO

## 2023-03-11 ENCOUNTER — Telehealth: Payer: Self-pay | Admitting: Physical Therapy

## 2023-03-11 NOTE — Telephone Encounter (Signed)
Patient called clinic 03/10/23 and left a message for a return call.  I called the patient today 03/11/23 and she asked about what she was being seen for, what her diagnosis is.   I reported the MD referral stated neck pain, L side and tension headache.  The PT diagnosis is cervicalgia, stiffness of spine , and cramp and spasm as stated in the evaluation.   It seemed she wanted to know what was actually going on in her neck.  I stated she was being treated for her symptoms and she may need an image of her spine to rule out other issues but the MD is the one who orders those.  He plans to call the MD as her symptoms are now worse.  I reminded her of her next appt and she declined cancelling the appts until she spoke to the MD about her condition.  I asked her to call us if she decides to cancel her upcoming appts .  Karie Mainland, PT 03/11/23 10:26 AM Phone: [phone] Fax: [phone]

## 2023-03-15 ENCOUNTER — Ambulatory Visit: Payer: BC Managed Care – PPO | Admitting: Physical Therapy

## 2023-03-15 DIAGNOSIS — Z79899 Other long term (current) drug therapy: Secondary | ICD-10-CM | POA: Diagnosis not present

## 2023-03-15 DIAGNOSIS — M0609 Rheumatoid arthritis without rheumatoid factor, multiple sites: Secondary | ICD-10-CM | POA: Diagnosis not present

## 2023-03-15 DIAGNOSIS — M1991 Primary osteoarthritis, unspecified site: Secondary | ICD-10-CM | POA: Diagnosis not present

## 2023-03-15 DIAGNOSIS — M941 Relapsing polychondritis: Secondary | ICD-10-CM | POA: Diagnosis not present

## 2023-03-16 ENCOUNTER — Ambulatory Visit: Payer: BC Managed Care – PPO | Admitting: Internal Medicine

## 2023-03-17 ENCOUNTER — Ambulatory Visit: Payer: BC Managed Care – PPO

## 2023-03-17 ENCOUNTER — Ambulatory Visit: Payer: BC Managed Care – PPO | Admitting: Family Medicine

## 2023-03-17 ENCOUNTER — Encounter: Payer: Self-pay | Admitting: Family Medicine

## 2023-03-17 VITALS — BP 110/70 | HR 92 | Temp 99.0°F | Ht 64.0 in | Wt 164.8 lb

## 2023-03-17 DIAGNOSIS — I959 Hypotension, unspecified: Secondary | ICD-10-CM | POA: Diagnosis not present

## 2023-03-17 DIAGNOSIS — R059 Cough, unspecified: Secondary | ICD-10-CM

## 2023-03-17 LAB — POCT INFLUENZA A/B
Influenza A, POC: NEGATIVE
Influenza B, POC: NEGATIVE

## 2023-03-17 LAB — POC COVID19 BINAXNOW: SARS Coronavirus 2 Ag: NEGATIVE

## 2023-03-17 NOTE — Patient Instructions (Signed)
  Drink LOTS of water for the next 2 days, aiming to get your urine a pale yellow, or clear. You may be slightly dehydrated, contributing to some dizziness and lower blood pressures.  Perhaps the diarrhea and being on a diuretic all contribute some to this. Continue to monitor your blood pressure regularly. If your BP remains 100/60 or less, then you can cut the amlodipine tablet in half (and continue the olmesartan-HCT). Contact us next week with your list of blood pressures and how you are feeling.  If you develop a fever, or worsening respiratory symptoms in the next 1-2 days, consider repeating a COVID test.  Take an antihistamine such as claritin or allegra or zyrtec. This may help with postnasal drainage/allergies and possibly some of the vertigo. Be sure to stay well hydrated--drinking plenty of water to keep the urine a very pale color.

## 2023-03-17 NOTE — Progress Notes (Signed)
Chief Complaint  Patient presents with   Hypotension    Has been having low blood pressure readings, went to doctor 2 days ago and it was 95/68. This am was the same. She has been feeling drained for the last week. No energy, when she gets up in the morning she feels like she is walking sideways like she has some form of vertigo. She developed a cough two days ago. Lots of mucus in her throat, when she lays a night she gets choked up.     BP was 90/60 at rheumatology visit on 9/10. She reports that she felt dizzy that morning--more like vertigo, couldn't walk straight. She also felt lightheaded. She tried to drink more water, didn't really help. She does report that her urine is very dark.    This morning she felt very fatigued, BP was 95/68 at home.  She feels LH today, feels a little out of it, "not normal".  Denies ear pain or plugging, no tinnitus. Denies further vertigo/equilibrium issues. She has had some recurrent cough, with PND. Hgb 12.2 on labs from rheum 9/10 (she was asking if she could be anemic)  BP Readings from Last 3 Encounters:  03/17/23 108/60  02/09/23 98/60  11/15/22 110/68   She is taking Benicar HCT 20-12.5, and amlodipine 5mg  daily, no recent changes in these meds.    PMH, PSH, SH reviewed  Outpatient Encounter Medications as of 03/17/2023  Medication Sig Note   amLODipine (NORVASC) 5 MG tablet Take 1 tablet (5 mg total) by mouth daily.    atorvastatin (LIPITOR) 20 MG tablet Take 1 tablet (20 mg total) by mouth daily.    Cholecalciferol (VITAMIN D3) 50 MCG (2000 UT) capsule TAKE 1 CAPSULE BY MOUTH EVERY DAY    folic acid (FOLVITE) 1 MG tablet Take 1 tablet (1 mg total) by mouth daily.    methotrexate (RHEUMATREX) 5 MG tablet Take 5 tablets (25 mg total) by mouth once a week. Caution:Chemotherapy. Protect from light. (Patient taking differently: Take 50 mg by mouth once a week. Caution:Chemotherapy. Protect from light.)    olmesartan-hydrochlorothiazide  (BENICAR HCT) 20-12.5 MG tablet Take 1 tablet by mouth daily.    OZEMPIC, 1 MG/DOSE, 4 MG/3ML SOPN Inject 1 mg into the skin once a week.    predniSONE (DELTASONE) 1 MG tablet Take 2 mg by mouth daily with breakfast.    predniSONE (DELTASONE) 5 MG tablet TAKE 2 TABLETS BY MOUTH DAILY WITH BREAKFAST. (Patient taking differently: Take 5 mg by mouth daily with breakfast.)    metFORMIN (GLUCOPHAGE-XR) 500 MG 24 hr tablet Take 1 tablet by mouth 2 (two) times daily with a meal. (Patient not taking: Reported on 03/17/2023) 03/17/2023: Stopped taking a month ago   [DISCONTINUED] acetaminophen (TYLENOL) 500 MG tablet Take 500 mg by mouth every 6 (six) hours as needed. 02/09/2023: Took 2 this am   [DISCONTINUED] fluticasone (FLONASE) 50 MCG/ACT nasal spray Place 2 sprays into both nostrils daily. 02/09/2023: As needed   [DISCONTINUED] tiZANidine (ZANAFLEX) 4 MG tablet Take 1 tablet (4 mg total) by mouth every 8 (eight) hours as needed for muscle spasms. (Patient not taking: Reported on 03/17/2023)    No facility-administered encounter medications on file as of 03/17/2023.   Allergies  Allergen Reactions   Ampicillin Shortness Of Breath   Penicillins Shortness Of Breath and Swelling       Sulfamethoxazole-Trimethoprim Other (See Comments)    "body on fire"   Cephalosporins Itching   Elemental Sulfur Other (See Comments)  Feeling of burning on the inside  Persist ant cough   Mobic [Meloxicam] Swelling and Rash    Pt states lips also swelled.   Nitrofuran Derivatives Itching    Throat, mouth, hands itch    ROS: dizziness per HPI. +fatigue. Dark urine, no dysuria, incontinence. Neck pain (left sided) persists. No radiation to the arm. No numbness/weakness No n/v. Some diarrhea No fever or chills.    PHYSICAL EXAM:  BP 108/60   Pulse 76   Temp 99 F (37.2 C) (Tympanic)   Ht 5\' 4"  (1.626 m)   Wt 164 lb 12.8 oz (74.8 kg)   LMP 10/09/2015   BMI 28.29 kg/m   108/64 on repeat by MD Orthostatic  VS by nurse-- Lying 90/70 P80 Sitting 100/60 P84 Standing 110/70 P 92  Wt Readings from Last 3 Encounters:  03/17/23 164 lb 12.8 oz (74.8 kg)  02/09/23 168 lb (76.2 kg)  11/15/22 173 lb 6.4 oz (78.7 kg)   Pleasant, well-appearing female in no distress HEENT: conjunctiva and sclera are clear, EOMI.  TM's and EAC's clear Nasal mucosa is normal, sinuses nontender. OP clear Neck: no lymphadenopathy or mass Heart: regular rate and rhythm Lungs: clear bilaterally Extremities: no edema Neuro: alert and oriented, cranial nerves grossly intact, normal gait  Negative influenza A&B and COVID tests today.   ASSESSMENT/PLAN:   Hypotension, unspecified hypotension type - given dark urine, suspect dehydration a factor (on diuretic, some diarrhea). Hydrate, monitor BP. If remains 100/60 or less, to cut amlodipine dose in 1/2  Cough, unspecified type - poss d/t underlying lung issues, vs allergies/PND.  Antihistamine prn. May be start of viral illness. To repeat COVID test if persistent/worsening sx - Plan: POC COVID-19, Influenza A/B  To schedule separate visit to f/u on neck issues. Briefly discussed rec for PT for muscular issues, imaging not necessarily required, no weakness, radiation. Unclear if patient was asking for this (wanting to know cause of her pain, asking therapist), or if there was a concern by therapist to suggest imaging.  Not discussed in detail today.   Drink LOTS of water for the next 2 days, aiming to get your urine a pale yellow, or clear. You may be slightly dehydrated, contributing to some dizziness and lower blood pressures.  Perhaps the diarrhea and being on a diuretic all contribute some to this. Continue to monitor your blood pressure regularly. If your BP remains 100/60 or less, then you can cut the amlodipine tablet in half (and continue the olmesartan-HCT). Contact us next week with your list of blood pressures and how you are feeling.  If you develop a  fever, or worsening respiratory symptoms in the next 1-2 days, consider repeating a COVID test.  Take an antihistamine such as claritin or allegra or zyrtec. This may help with postnasal drainage/allergies and possibly some of the vertigo. Be sure to stay well hydrated--drinking plenty of water to keep the urine a very pale color.

## 2023-03-21 ENCOUNTER — Encounter: Payer: Self-pay | Admitting: Internal Medicine

## 2023-03-22 ENCOUNTER — Ambulatory Visit: Payer: BC Managed Care – PPO

## 2023-03-24 ENCOUNTER — Ambulatory Visit: Payer: BC Managed Care – PPO

## 2023-03-28 ENCOUNTER — Encounter: Payer: Self-pay | Admitting: Family Medicine

## 2023-03-28 ENCOUNTER — Ambulatory Visit: Payer: BC Managed Care – PPO | Admitting: Family Medicine

## 2023-03-28 VITALS — BP 110/70 | HR 88 | Temp 99.6°F | Ht 64.0 in | Wt 165.0 lb

## 2023-03-28 DIAGNOSIS — R350 Frequency of micturition: Secondary | ICD-10-CM

## 2023-03-28 DIAGNOSIS — N3001 Acute cystitis with hematuria: Secondary | ICD-10-CM

## 2023-03-28 DIAGNOSIS — H10503 Unspecified blepharoconjunctivitis, bilateral: Secondary | ICD-10-CM | POA: Diagnosis not present

## 2023-03-28 LAB — POCT URINALYSIS DIP (PROADVANTAGE DEVICE)
Bilirubin, UA: NEGATIVE
Glucose, UA: NEGATIVE mg/dL
Ketones, POC UA: NEGATIVE mg/dL
Nitrite, UA: POSITIVE — AB
Protein Ur, POC: NEGATIVE mg/dL
Specific Gravity, Urine: 1.02
Urobilinogen, Ur: 0.2
pH, UA: 5 (ref 5.0–8.0)

## 2023-03-28 MED ORDER — FLUCONAZOLE 150 MG PO TABS: 150.0000 mg | ORAL_TABLET | Freq: Once | ORAL | 0 refills | Status: AC

## 2023-03-28 MED ORDER — CIPROFLOXACIN HCL 250 MG PO TABS
250.0000 mg | ORAL_TABLET | Freq: Two times a day (BID) | ORAL | 0 refills | Status: AC
Start: 2023-03-28 — End: 2023-04-04

## 2023-03-28 NOTE — Progress Notes (Signed)
Chief Complaint  Patient presents with   Urinary Frequency    Urinary frequency that started Saturday a well as some discomfort/burning.     2 days ago started with pain with urination. She also had some RLE pain. She thought she may have had the pessary in too long.  She had stopped using it for a while, but had recurrent incontinence with her cough, so restarted it.  It continues to hurt with voiding, going more frequently, volumes are some small, sometimes normal. Some RLQ discomfort, no suprapubic pain.    PMH, PSH, SH reviewed  Outpatient Encounter Medications as of 03/28/2023  Medication Sig Note   amLODipine (NORVASC) 5 MG tablet Take 1 tablet (5 mg total) by mouth daily. 03/28/2023: Taking 2.5mg    atorvastatin (LIPITOR) 20 MG tablet Take 1 tablet (20 mg total) by mouth daily.    Cholecalciferol (VITAMIN D3) 50 MCG (2000 UT) capsule TAKE 1 CAPSULE BY MOUTH EVERY DAY    ciprofloxacin (CIPRO) 250 MG tablet Take 1 tablet (250 mg total) by mouth 2 (two) times daily for 7 days.    fluconazole (DIFLUCAN) 150 MG tablet Take 1 tablet (150 mg total) by mouth once for 1 dose. Take 1 tablet by mouth for yeast infection.    folic acid (FOLVITE) 1 MG tablet Take 1 tablet (1 mg total) by mouth daily.    methotrexate (RHEUMATREX) 5 MG tablet Take 5 tablets (25 mg total) by mouth once a week. Caution:Chemotherapy. Protect from light. (Patient taking differently: Take 50 mg by mouth once a week. Caution:Chemotherapy. Protect from light.) 03/28/2023: 50mg  once a week   olmesartan-hydrochlorothiazide (BENICAR HCT) 20-12.5 MG tablet Take 1 tablet by mouth daily.    OZEMPIC, 1 MG/DOSE, 4 MG/3ML SOPN Inject 1 mg into the skin once a week.    predniSONE (DELTASONE) 1 MG tablet Take 2 mg by mouth daily with breakfast.    predniSONE (DELTASONE) 5 MG tablet TAKE 2 TABLETS BY MOUTH DAILY WITH BREAKFAST. (Patient taking differently: Take 5 mg by mouth daily with breakfast.)    metFORMIN (GLUCOPHAGE-XR) 500 MG 24  hr tablet Take 1 tablet by mouth 2 (two) times daily with a meal. (Patient not taking: Reported on 03/17/2023) 03/28/2023: Stopped about a month ago   No facility-administered encounter medications on file as of 03/28/2023.   NOT taking diflucan or cipro prior to today's visit  Allergies  Allergen Reactions   Ampicillin Shortness Of Breath   Penicillins Shortness Of Breath and Swelling       Sulfamethoxazole-Trimethoprim Other (See Comments)    "body on fire"   Cephalosporins Itching   Elemental Sulfur Other (See Comments)    Feeling of burning on the inside  Persist ant cough   Mobic [Meloxicam] Swelling and Rash    Pt states lips also swelled.   Nitrofuran Derivatives Itching    Throat, mouth, hands itch    ROS: no f/c, n/v/d. No vaginal discharge, slight itch/odor. Some low back pain, no flank pain. No HA, chest pain, shortness of breath    PHYSICAL EXAM:  BP 110/70   Pulse 88   Temp 99.6 F (37.6 C) (Tympanic)   Ht 5\' 4"  (1.626 m)   Wt 165 lb (74.8 kg)   LMP 10/09/2015   BMI 28.32 kg/m   Well-appearing, pleasant female in no distress. No coughing during visit. HEENT: conjunctiva and sclera are clear, EOMI.  Wearing mask Neck: no lymphadenopathy or mass Heart: regular rate and rhythm Lungs: clear bilaterally Back: no  spinal tenderness.  No CVA tenderness. Mildly tender over bilateral paraspinous muscles in lumbar area. Abdomen: soft. Mild tenderness in suprapubic area, extending to the right. No mass, rebound tenderness or guarding. Extremities: no edema  Urine: SG 1.020, large blood, nitrite+, 1+ leuks.   ASSESSMENT/PLAN:  Acute cystitis with hematuria - Plan: ciprofloxacin (CIPRO) 250 MG tablet, Urine Culture  Urinary frequency - Plan: POCT Urinalysis DIP (Proadvantage Device), Urine Culture  Declines flu and COVID vaccines   Drink plenty of water. Take the antibiotics as prescribed. Take the diflucan (medication for yeast infection) only if/when you  develop symptoms (increased vaginal discharge and itching). Check your MyChart for results. Contact us if you have persistent or worsening symptoms, especially if you have fever or flank pain.

## 2023-03-28 NOTE — Patient Instructions (Signed)
  Drink plenty of water. Take the antibiotics as prescribed. Take the diflucan (medication for yeast infection) only if/when you develop symptoms (increased vaginal discharge and itching). Check your MyChart for results. Contact us if you have persistent or worsening symptoms, especially if you have fever or flank pain.

## 2023-03-30 LAB — URINE CULTURE

## 2023-04-01 DIAGNOSIS — E559 Vitamin D deficiency, unspecified: Secondary | ICD-10-CM | POA: Diagnosis not present

## 2023-04-01 DIAGNOSIS — R768 Other specified abnormal immunological findings in serum: Secondary | ICD-10-CM | POA: Diagnosis not present

## 2023-04-01 DIAGNOSIS — I10 Essential (primary) hypertension: Secondary | ICD-10-CM | POA: Diagnosis not present

## 2023-04-01 DIAGNOSIS — N1831 Chronic kidney disease, stage 3a: Secondary | ICD-10-CM | POA: Diagnosis not present

## 2023-04-01 LAB — BASIC METABOLIC PANEL: Creatinine: 1.1 (ref 0.5–1.1)

## 2023-04-01 LAB — COMPREHENSIVE METABOLIC PANEL: eGFR: 58

## 2023-04-11 ENCOUNTER — Encounter: Payer: Self-pay | Admitting: *Deleted

## 2023-04-11 DIAGNOSIS — H10503 Unspecified blepharoconjunctivitis, bilateral: Secondary | ICD-10-CM | POA: Diagnosis not present

## 2023-04-18 ENCOUNTER — Other Ambulatory Visit: Payer: Self-pay | Admitting: Internal Medicine

## 2023-04-18 DIAGNOSIS — M941 Relapsing polychondritis: Secondary | ICD-10-CM

## 2023-05-02 DIAGNOSIS — H10503 Unspecified blepharoconjunctivitis, bilateral: Secondary | ICD-10-CM | POA: Diagnosis not present

## 2023-05-02 DIAGNOSIS — E119 Type 2 diabetes mellitus without complications: Secondary | ICD-10-CM | POA: Diagnosis not present

## 2023-05-02 DIAGNOSIS — H25813 Combined forms of age-related cataract, bilateral: Secondary | ICD-10-CM | POA: Diagnosis not present

## 2023-05-04 NOTE — Progress Notes (Unsigned)
No chief complaint on file.   Seen 9/23 with acute cystitis.  She was treated with cipro, and also prescribed diflucan #1.  Culture grew out e.coli, sensitive to the cipro that was prescribed.      PMH, PSH, SH reviewed   ROS:    PHYSICAL EXAM:  LMP 10/09/2015       ASSESSMENT/PLAN:   Appt notes states she needs A1c. Last was 11/2022. She is under the care of [facility] end[facility] for her DM, we don't manage. Ensure that she has endo f/u scheduled (should be in November)

## 2023-05-05 ENCOUNTER — Ambulatory Visit: Payer: BC Managed Care – PPO | Admitting: Family Medicine

## 2023-05-05 ENCOUNTER — Encounter: Payer: Self-pay | Admitting: Family Medicine

## 2023-05-05 VITALS — BP 106/64 | HR 68 | Temp 98.3°F | Wt 157.2 lb

## 2023-05-05 DIAGNOSIS — N898 Other specified noninflammatory disorders of vagina: Secondary | ICD-10-CM

## 2023-05-05 DIAGNOSIS — E1122 Type 2 diabetes mellitus with diabetic chronic kidney disease: Secondary | ICD-10-CM

## 2023-05-05 DIAGNOSIS — R197 Diarrhea, unspecified: Secondary | ICD-10-CM

## 2023-05-05 DIAGNOSIS — N182 Chronic kidney disease, stage 2 (mild): Secondary | ICD-10-CM | POA: Diagnosis not present

## 2023-05-05 LAB — POCT GLYCOSYLATED HEMOGLOBIN (HGB A1C): Hemoglobin A1C: 6.2 % — AB (ref 4.0–5.6)

## 2023-05-05 NOTE — Patient Instructions (Signed)
  Take the diflucan that was prescribed last month.  If there is an issue with getting, let us know and we can send in another. You only take 1 pill, but it can take up to a week for the full results.  Contact us next week if not completely better, for a refill.   Continue to avoid dairy. Continue to avoid fatty, greasy, fried foods. I recommend a trial of probiotics Try and follow a high fiber diet in genera, and you can try adding Metamucil once or twice daily to see if this helps with the loose stools. Please see Dr. Loreta Ave if you have ongoing problems with diarrhea, for further evaluation.  Your A1c was 6.2%, which is very good.  No issues with you have surgery related to your diabetes.

## 2023-05-06 ENCOUNTER — Ambulatory Visit: Payer: BC Managed Care – PPO | Admitting: Internal Medicine

## 2023-05-06 ENCOUNTER — Encounter: Payer: Self-pay | Admitting: Internal Medicine

## 2023-05-06 VITALS — BP 100/67 | HR 78 | Temp 98.6°F | Resp 18 | Ht 64.0 in | Wt 159.8 lb

## 2023-05-06 DIAGNOSIS — M941 Relapsing polychondritis: Secondary | ICD-10-CM

## 2023-05-06 NOTE — Patient Instructions (Signed)
It was a pleasure to see you today!  Please schedule follow up scheduled with myself in 1 year.  If my schedule is not open yet, we will contact you with a reminder closer to that time. Please call [phone] if you haven't heard from [place] a month before, and always call us sooner if issues or concerns arise. You can also send [place] a message through MyChart, but but aware that this is not to be used for urgent issues and it may take up to 5-7 days to receive a reply. Please be aware that you will likely be able to view your results before I have a chance to respond to them. Please give [place] 5 business days to respond to any non-urgent results.    Continue methotrexate and prednisone per Dr. Nickola Major.  I understand your concerns about starting a new medication for your condition. I encourage you to seek out resources from online support groups for your condition We did discuss the risks of long term steroid use today, and that any medications such as enbrel would be in an attempt to help prevent those.  Please don't hesitate to contact me if additional questions or concerns.

## 2023-05-06 NOTE — Progress Notes (Signed)
Michelle Mack    [id number]    1966/07/05  Primary Care Physician:Knapp, Clarene Critchley, MD Date of Appointment: 05/06/2023 Established Patient Visit  Chief complaint:   Chief Complaint  Patient presents with   Follow-up    Breathing is good      HPI: Michelle Mack is a 57 y.o. woman with relapsing polychondritis with pulmonary involvement.   Interval Updates: Here for follow up. Seeing Dr. Nickola Major  Denies dyspnea, wheezing.  Feels actually quite well. No new skin changes.  Currently on 10 mg weekly methotrexate with 6 mg prednisone.  She actually looks quite well compared to when I saw her last year.   Split night study showed no OSA - did not need titration.    I have reviewed the patient's family social and past medical history and updated as appropriate.   Past Medical History:  Diagnosis Date   Anemia    on iron   Back pain    tx with ibuprofen 800mg    Bronchitis    treated recently by abx   Cough    non-productive   DUB (dysfunctional uterine bleeding)    Fibroid uterus    GYN--Dr. Rivard   GERD (gastroesophageal reflux disease)    Hypertension    Infertility, female    OA (osteoarthritis) of knee 04/02/2020   Mod osteoarthritis, medial compartment of L knee, per x-rays at [facility].  Cortisone injection by Dr. Cleophas Dunker 03/05/20   Polychondritis    Recurrent upper respiratory infection (URI)    in October - tx with abx   Type II or unspecified type diabetes mellitus without mention of complication, not stated as uncontrolled    diagnosed by Dr. Loleta Chance    Past Surgical History:  Procedure Laterality Date   BRONCHOSCOPY  01/02/2014   at [facility] expiratory collapse of trachea and focal narrowing in L mainstem bronchus and LUL   ENDOMETRIAL VAPORIZATION W/ VERSAPOINT     LEFT HEART CATH AND CORONARY ANGIOGRAPHY N/A 07/19/2018   Procedure: LEFT HEART CATH AND CORONARY ANGIOGRAPHY;  Surgeon: Elder Negus, MD;  Location: [facility] INVASIVE CV  LAB;  Service: Cardiovascular;  Laterality: N/A;   MYOMECTOMY  07/05/2000   SUPRACERVICAL ABDOMINAL HYSTERECTOMY     still has ovaries    Family History  Problem Relation Age of Onset   Hypertension Mother    Diabetes Mother    Hypertension Father    Diabetes Father    Cancer Father        prostate cancer--metastasized 2021   Hypertension Sister    Thyroid nodules Sister    Diabetes Maternal Grandmother    Breast cancer Maternal Grandmother    Heart disease Maternal Grandmother    Heart disease Paternal Grandmother     Social History   Occupational History   Occupation: Clinical biochemist at [employer]    Employer: [employer]  Tobacco Use   Smoking status: Never    Passive exposure: Never   Smokeless tobacco: Never  Vaping Use   Vaping status: Never Used  Substance and Sexual Activity   Alcohol use: Yes    Comment: 0-1 drink/week, "special days"   Drug use: No   Sexual activity: Not Currently    Birth control/protection: None     Physical Exam: Blood pressure 100/67, pulse 78, temperature 98.6 F (37 C), temperature source Oral, resp. rate 18, height 5\' 4"  (1.626 m), weight 159 lb 12.8 oz (72.5 kg), last menstrual period 10/09/2015,  SpO2 95%.  Gen:      No distress, well appearing ENT:  mmm Lungs:   ctab no wheezes or crackles CV:        RRR no mrg,    Data Reviewed: Imaging: I have personally reviewed the chest xray Nov 2022 - no acute process Previously reviewed CT Chest reports in care everywhere document tracheomalacia.   PFTs:     Latest Ref Rng & Units 12/04/2021   12:05 PM  PFT Results  FVC-Pre L 2.71   FVC-Predicted Pre % 95   FVC-Post L 2.61   FVC-Predicted Post % 92   Pre FEV1/FVC % % 54   Post FEV1/FCV % % 56   FEV1-Pre L 1.45   FEV1-Predicted Pre % 64   FEV1-Post L 1.46   DLCO uncorrected ml/min/mmHg 17.86   DLCO UNC% % 85   DLCO corrected ml/min/mmHg 17.86   DLCO COR %Predicted % 85   DLVA Predicted % 107   TLC L 4.69   TLC %  Predicted % 92   RV % Predicted % 105    I have personally reviewed the patient's PFTs today which shows moderate airflow limitation FEV1 64% of predicted. No bronchodilator response, normal lung volumes and diffusion capacity.  I have personally reviewed the patient's PFTs and in Jan 2021 showed moderate airflow limitation FEV1 68% of predicted.    Labs: Lab Results  Component Value Date   WBC 8.0 10/06/2022   HGB 12.1 10/06/2022   HCT 35.9 10/06/2022   MCV 88 10/06/2022   PLT 358 10/06/2022   Lab Results  Component Value Date   NA 144 10/06/2022   K 4.5 10/06/2022   CL 103 10/06/2022   CO2 25 10/06/2022     Immunization status: Immunization History  Administered Date(s) Administered   Hepatitis A 12/27/2007, 01/24/2008, 07/17/2008   Hepatitis B 12/27/2007, 01/24/2008, 07/17/2008   Hepatitis B, PED/ADOLESCENT 12/27/2007, 01/24/2008, 07/17/2008   IPV 12/27/2007   MMR 12/27/2007   Meningococcal polysaccharide vaccine (MPSV4) 12/27/2007   PFIZER(Purple Top)SARS-COV-2 Vaccination 09/18/2019, 10/09/2019, 06/17/2020   PPD Test 04/14/2011, 06/08/2013, 01/01/2015, 03/08/2017   Td 12/28/1995   Td (Adult),5 Lf Tetanus Toxid, Preservative Free 12/28/1995   Tdap 12/27/2007, 04/11/2019   Typhoid Inactivated 12/27/2007   Varicella 12/27/2007   Yellow Fever 01/24/2008    External Records Personally Reviewed: PCP  Assessment:  Relapsing Polychondritis - stable Tracheomalacia - asymptomatic  Plan/Recommendations:  Continue methotrexate and prednisone per Dr. Nickola Major.  I understand your concerns about starting a new medication for your condition. I encourage you to seek out resources from online support groups for your condition We did discuss the risks of long term steroid use today, and that any medications such as enbrel would be in an attempt to help prevent those. We discussed these risk factors at some length today.   Should she develop symptoms would consider  bronchoscopy for evaluation of progression of RP.  Please don't hesitate to contact me if additional questions or concerns.   I spent 30 minutes in the care of this patient today including pre-charting, chart review, review of results, face-to-face care, coordination of care and communication with consultants etc.).   Return to Care: Return in about 1 year (around 05/05/2024).   Durel Salts, MD Pulmonary and Critical Care Medicine [facility] Office:[phone]

## 2023-05-08 LAB — NUSWAB VAGINITIS PLUS (VG+)
Candida albicans, NAA: NEGATIVE
Candida glabrata, NAA: NEGATIVE
Chlamydia trachomatis, NAA: NEGATIVE
Neisseria gonorrhoeae, NAA: NEGATIVE
Trich vag by NAA: NEGATIVE

## 2023-05-11 ENCOUNTER — Telehealth: Payer: Self-pay | Admitting: *Deleted

## 2023-05-11 NOTE — Telephone Encounter (Signed)
We did test for everything. If itching is external, then it could be related to topical exposures (scented things, soaps, etc).   That shouldn't cause discharge. I don't believe she is taking any hormones--none are listed in her med list.  If she has seen GYN and is taking hormones, that can cause discharge (not infection).  I believe she has been under the care of Dr. Estanislado Pandy. She may want to see her GYN for further evaluation. If needed, I can see her again and send off another NuSwab.

## 2023-05-11 NOTE — Telephone Encounter (Signed)
Patient called and she took the diflucan, she is not better. Instead of another diflucan she is asking if you would call in [place]. [facility].

## 2023-05-11 NOTE — Telephone Encounter (Signed)
Patient advised. She said she is still itching and having milky discharge. The discharge may have even increased, she is wearing a pantyliner now. She asked to come back in for more testing. I let her know that you tested her for everything. Do you want her to follow up or do you have another recommendation?

## 2023-05-11 NOTE — Telephone Encounter (Signed)
She never viewed her MyChart results. The Nuswab was negative for yeast (for everything, actually). She doesn't have a yeast infection.  Doesn't need a new rx. Please advise

## 2023-05-12 ENCOUNTER — Other Ambulatory Visit: Payer: Self-pay | Admitting: Family Medicine

## 2023-05-12 DIAGNOSIS — I10 Essential (primary) hypertension: Secondary | ICD-10-CM

## 2023-05-12 DIAGNOSIS — N898 Other specified noninflammatory disorders of vagina: Secondary | ICD-10-CM

## 2023-05-12 DIAGNOSIS — E7800 Pure hypercholesterolemia, unspecified: Secondary | ICD-10-CM

## 2023-05-12 NOTE — Telephone Encounter (Signed)
Patient Michelle Mack will call back if she would like to be seen.

## 2023-05-13 DIAGNOSIS — M1712 Unilateral primary osteoarthritis, left knee: Secondary | ICD-10-CM | POA: Diagnosis not present

## 2023-05-31 ENCOUNTER — Other Ambulatory Visit: Payer: Self-pay | Admitting: Family Medicine

## 2023-05-31 ENCOUNTER — Other Ambulatory Visit: Payer: Self-pay | Admitting: Internal Medicine

## 2023-05-31 DIAGNOSIS — N898 Other specified noninflammatory disorders of vagina: Secondary | ICD-10-CM

## 2023-06-06 ENCOUNTER — Encounter: Payer: Self-pay | Admitting: Podiatry

## 2023-06-06 ENCOUNTER — Ambulatory Visit (INDEPENDENT_AMBULATORY_CARE_PROVIDER_SITE_OTHER): Payer: BC Managed Care – PPO | Admitting: Podiatry

## 2023-06-06 DIAGNOSIS — B351 Tinea unguium: Secondary | ICD-10-CM

## 2023-06-06 NOTE — Patient Instructions (Signed)
I have ordered a medication for you that will come from [facility] in [place]. They should be calling you to verify insurance and will mail the medication to you. If you live close by then you can go by their pharmacy to pick up the medication. Their phone number is [phone]. If you do not hear from them in the next few days, please give us a call at [phone].   

## 2023-06-06 NOTE — Progress Notes (Signed)
Subjective: Chief Complaint  Patient presents with   Nail Problem    RM# 12 Nail trim for culture right second toe    57 year old female presents the office today for right second toe follow-up.  Not seen her since last year for this.  Originally it was thought that the specimen was lost but it was not.  I contacted her through MyChart with the results but no follow-up after that.  She presents today for further evaluation.  Objective: AAO x3, NAD DP/PT pulses palpable bilaterally, CRT less than 3 seconds Right send nails hypertrophic, dystrophic there is brown discoloration with several debris present.  There is no significant hyperpigmentation or to the surrounding skin.  There is no edema, erythema or any signs of infection. No pain with calf compression, swelling, warmth, erythema  Assessment: Onychomycosis  Plan: -All treatment options discussed with the patient including all alternatives, risks, complications.  -I reviewed the nail culture is to show fungus.  Discussed oral, topical medications.  After discussion was to proceed with topical medication.  I have faxed an order [facility] fungus. -The culture also did show melanin pigment which is typically a benign finding.  Will need to monitor this.  If her symptoms do not improve with medication consider removal/further biopsy. -Patient encouraged to call the office with any questions, concerns, change in symptoms.   Vivi Barrack DPM

## 2023-06-10 ENCOUNTER — Telehealth: Payer: Self-pay

## 2023-06-10 NOTE — Telephone Encounter (Signed)
Pharmacist called this morning - he cannot compound the nail lacquer without the DMSO - it is the component that breaks down the nail surface and lets the medicine absorb. If you want to try the [facility] compound, he will fill it. If not, he suggests a topical gel  or cream instead Please advise - Thanks

## 2023-06-15 DIAGNOSIS — Z79899 Other long term (current) drug therapy: Secondary | ICD-10-CM | POA: Diagnosis not present

## 2023-06-15 DIAGNOSIS — M941 Relapsing polychondritis: Secondary | ICD-10-CM | POA: Diagnosis not present

## 2023-06-15 DIAGNOSIS — R5383 Other fatigue: Secondary | ICD-10-CM | POA: Diagnosis not present

## 2023-06-15 DIAGNOSIS — M1991 Primary osteoarthritis, unspecified site: Secondary | ICD-10-CM | POA: Diagnosis not present

## 2023-06-15 DIAGNOSIS — M0609 Rheumatoid arthritis without rheumatoid factor, multiple sites: Secondary | ICD-10-CM | POA: Diagnosis not present

## 2023-06-20 ENCOUNTER — Ambulatory Visit: Payer: BC Managed Care – PPO | Admitting: Medical

## 2023-06-20 VITALS — BP 120/62 | HR 98 | Temp 97.5°F | Wt 158.0 lb

## 2023-06-20 DIAGNOSIS — R21 Rash and other nonspecific skin eruption: Secondary | ICD-10-CM

## 2023-06-20 DIAGNOSIS — R238 Other skin changes: Secondary | ICD-10-CM

## 2023-06-20 NOTE — Progress Notes (Signed)
Subjective:  Michelle Mack is a 57 y.o. female who presents for Chief Complaint  Patient presents with   possible allergic reaction    Possible allergic reaction. Went and got eye brows and chin waxed and thinks she is allergic to it     Here for possible allergic reaction.  She went for waxing of eyebrows and chin 2 days ago and later that day had bumps and irritated itchy skin of eyebrows and chin.   Using some benadryl and lotion.  No worse or better.  No other aggravating or relieving factors.    No other c/o.  The following portions of the patient's history were reviewed and updated as appropriate: allergies, current medications, past family history, past medical history, past social history, past surgical history and problem list.  ROS Otherwise as in subjective above    Objective: BP 120/62   Pulse 98   Temp (!) 97.5 F (36.4 C)   Wt 158 lb (71.7 kg)   LMP 10/09/2015   BMI 27.12 kg/m   Wt Readings from Last 3 Encounters:  06/20/23 158 lb (71.7 kg)  05/06/23 159 lb 12.8 oz (72.5 kg)  05/05/23 157 lb 3.2 oz (71.3 kg)    General appearance: alert, no distress, well developed, well nourished Across the forehead along the eyebrow lines and chin are several scattered flesh-colored papular lesions with irritated skin.  No erythema, no induration or fluctuance, no other rash   Assessment: Encounter Diagnoses  Name Primary?   Rash Yes   Skin irritation      Plan: Recommendations: I recommend using over-the-counter allergy medicine such as Allegra, Zyrtec and Benadryl nightly for the next several nights to help with itching.  You can use the same medicine or half dose during the day to help with the itching.  For example you could do 25 mg of Benadryl at night and 12.5 mg of Benadryl once or twice a day during the daytime to help with the itching and rash I recommend using aloe vera plant or moisturizing lotion with aloe twice a day during the daytime but use  Vaseline at nighttime at bedtime. Use cold compresses such as cold wet rag over the face and chin several times a day the next few days Another option instead of moisturizing lotion and Vaseline could be alternating moisturizing lotion once or twice a day with over-the-counter Cortaid or hydrocortisone cream once or twice a day to the affected areas We do not typically use any stronger prescription cream on the face unless the rash is really really bad If new symptoms or worse symptoms in the next few days then call back    Michelle Mack was seen today for possible allergic reaction.  Diagnoses and all orders for this visit:  Rash  Skin irritation    Follow up: prn

## 2023-06-20 NOTE — Patient Instructions (Signed)
Recommendations: I recommend using over-the-counter allergy medicine such as Allegra, Zyrtec and Benadryl nightly for the next several nights to help with itching.  You can use the same medicine or half dose during the day to help with the itching.  For example you could do 25 mg of Benadryl at night and 12.5 mg of Benadryl once or twice a day during the daytime to help with the itching and rash I recommend using aloe vera plant or moisturizing lotion with aloe twice a day during the daytime but use Vaseline at nighttime at bedtime. Use cold compresses such as cold wet rag over the face and chin several times a day the next few days Another option instead of moisturizing lotion and Vaseline could be alternating moisturizing lotion once or twice a day with over-the-counter Cortaid or hydrocortisone cream once or twice a day to the affected areas We do not typically use any stronger prescription cream on the face unless the rash is really really bad If new symptoms or worse symptoms in the next few days then call back

## 2023-06-25 DIAGNOSIS — J3489 Other specified disorders of nose and nasal sinuses: Secondary | ICD-10-CM | POA: Diagnosis not present

## 2023-06-25 DIAGNOSIS — R519 Headache, unspecified: Secondary | ICD-10-CM | POA: Diagnosis not present

## 2023-06-25 DIAGNOSIS — R112 Nausea with vomiting, unspecified: Secondary | ICD-10-CM | POA: Diagnosis not present

## 2023-06-25 DIAGNOSIS — R300 Dysuria: Secondary | ICD-10-CM | POA: Diagnosis not present

## 2023-07-07 DIAGNOSIS — R7989 Other specified abnormal findings of blood chemistry: Secondary | ICD-10-CM | POA: Diagnosis not present

## 2023-07-14 ENCOUNTER — Telehealth (INDEPENDENT_AMBULATORY_CARE_PROVIDER_SITE_OTHER): Payer: BC Managed Care – PPO | Admitting: Family Medicine

## 2023-07-14 ENCOUNTER — Other Ambulatory Visit: Payer: Self-pay | Admitting: *Deleted

## 2023-07-14 ENCOUNTER — Encounter: Payer: Self-pay | Admitting: Family Medicine

## 2023-07-14 ENCOUNTER — Other Ambulatory Visit (INDEPENDENT_AMBULATORY_CARE_PROVIDER_SITE_OTHER): Payer: BC Managed Care – PPO

## 2023-07-14 VITALS — Temp 97.9°F | Ht 64.0 in | Wt 155.0 lb

## 2023-07-14 DIAGNOSIS — J069 Acute upper respiratory infection, unspecified: Secondary | ICD-10-CM | POA: Diagnosis not present

## 2023-07-14 DIAGNOSIS — R197 Diarrhea, unspecified: Secondary | ICD-10-CM

## 2023-07-14 DIAGNOSIS — R051 Acute cough: Secondary | ICD-10-CM

## 2023-07-14 LAB — POCT INFLUENZA A/B
Influenza A, POC: NEGATIVE
Influenza B, POC: NEGATIVE

## 2023-07-14 LAB — POC COVID19 BINAXNOW: SARS Coronavirus 2 Ag: NEGATIVE

## 2023-07-14 NOTE — Patient Instructions (Addendum)
  Drink plenty of water. You can continue the alka-seltzer plus cold medications. Verify that it doesn't have either guaifenesin or dextromethorphan.  If it doesn't, then I recommend taking Mucinex DM 12 hour tablet twice daily. This loosens up the mucus and phlegm, and has a cough suppressant.  If the alka seltzer has dextromethorphan, then use a PLAIN mucinex (not the DM).  If COVID test is positive, we will treat with paxlovid (and you will need to stop atorvastatin  for the 5 days that you take the paxlovid). If you are positive for influenza, you are past the window where Tamiflu could help.  I'm not checking for RSV, since there is no separate treatment for this, just treating your symptoms. If COVID test is negative today, you can consider repeating the COVID test tomorrow if symptoms are worse.  BRAT diet for diarrhea--bananas, rice, applesauce and toast. Avoid dairy (milk, cheese) until your diarrhea has been better for a few days.  I encourage you to call the eye doctor--I'm sure they would want you to reschedule your surgery, given that you are sick and coughing. Also because your cold medication that you are taking has aspirin  in it, and you shouldn't have eye surgery while taking aspirin .  I highly encourage you to get the flu shot and pneumonia vaccine that we previously discussed and recommended.  I hope you feel better soon! Come for your flu and COVID testing as we discussed, and we will let you know those results via MyChart.

## 2023-07-14 NOTE — Progress Notes (Signed)
 Start time: 9:47 End time: 10:04  Virtual Visit via Video Note  I connected with Michelle Mack on 07/14/23 by a video enabled telemedicine application and verified that I am speaking with the correct person using two identifiers.  Location: Patient: work Provider: office   I discussed the limitations of evaluation and management by telemedicine and the availability of in person appointments. The patient expressed understanding and agreed to proceed.  History of Present Illness:  Chief Complaint  Patient presents with   Cough    VIRTUAL slight cough that started Monday and diarrhea that started yesterday. 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 She is scheduled for surgery tomorrow. Has not done any home covid tests.     She is scheduled to have R cataract surgery tomorrow.  Yesterday morning she woke up with diarrhea, had diarrhea throughout the day, and this morning. She started coughing 3 days ago. She has some nasal congestion and runny nose. Nasal drainage is clear. Cough is dry, hacky.  Denies any shortness of breath.  She feels a little wheezy when coughing a lot. She doesn't use inhalers (we don't get along), hasn't felt like she needed it.  She started taking alka seltzer plus cold/cough yesterday, which helps.  No known fever, chills. Denies body aches.  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  Had COVID in the past, treated with lagevrio  in 07/2021.  On 7 mg of prednisone  currently.  PMH, PSH, SH reviewed  Outpatient Encounter Medications as of 07/14/2023  Medication Sig Note   amLODipine  (NORVASC ) 5 MG tablet TAKE 1 TABLET BY MOUTH EVERY DAY    atorvastatin  (LIPITOR) 20 MG tablet TAKE 1 TABLET BY MOUTH EVERY DAY    Chlorphen-Phenyleph-ASA (ALKA-SELTZER PLUS COLD) 2-7.8-325 MG TBEF Take 2 each by mouth as needed. 07/14/2023: Last dose last night   Cholecalciferol (VITAMIN D3) 50 MCG (2000 UT)  capsule TAKE 1 CAPSULE BY MOUTH EVERY DAY    folic acid  (FOLVITE ) 1 MG tablet TAKE 1 TABLET BY MOUTH EVERY DAY    methotrexate  (RHEUMATREX) 5 MG tablet Take 5 tablets (25 mg total) by mouth once a week. Caution:Chemotherapy. Protect from light. (Patient taking differently: Take 50 mg by mouth once a week. Caution:Chemotherapy. Protect from light.) 03/28/2023: 50mg  once a week   olmesartan -hydrochlorothiazide  (BENICAR  HCT) 20-12.5 MG tablet TAKE 1 TABLET BY MOUTH EVERY DAY    predniSONE  (DELTASONE ) 1 MG tablet Take 2 mg by mouth daily with breakfast.    predniSONE  (DELTASONE ) 5 MG tablet TAKE 2 TABLETS BY MOUTH DAILY WITH BREAKFAST.    metFORMIN  (GLUCOPHAGE -XR) 500 MG 24 hr tablet Take 1 tablet by mouth 2 (two) times daily with a meal. (Patient not taking: Reported on 07/14/2023) 07/14/2023: Stopped for surgery   OZEMPIC, 1 MG/DOSE, 4 MG/3ML SOPN Inject 1 mg into the skin once a week. (Patient not taking: Reported on 07/14/2023) 07/14/2023: Stopped for surgery   No facility-administered encounter medications on file as of 07/14/2023.   Allergies  Allergen Reactions   Ampicillin Shortness Of Breath   Penicillins Shortness Of Breath and Swelling       Sulfamethoxazole -Trimethoprim  Other (See Comments)    body on fire   Wax [Beeswax]     Hair wax reaction/bump skin   Cephalosporins Itching   Elemental Sulfur Other (See Comments)    Feeling of burning on the inside  Persist ant cough   Mobic  [Meloxicam ]  Swelling and Rash    Pt states lips also swelled.   Nitrofuran Derivatives Itching    Throat, mouth, hands itch   ROS: no fever, chills.  +URI symptoms and diarrhea per HPI. No chest pain, shortness of breath. No rash.   Observations/Objective:  Temp 97.9 F (36.6 C) (Temporal)   Ht 5' 4 (1.626 m)   Wt 155 lb (70.3 kg)   LMP 10/09/2015   BMI 26.61 kg/m   Well-appearing female, speaking comfortably, in no distress. Sounds slightly congested. No coughing during visit. Exam is limited due  to the virtual nature of the visit  Assessment and Plan:  Viral upper respiratory illness - coming to test for flu/COVID. Ddx reviewed, including RSV (no treatment), other viruses. Supportive measures reviewed  Diarrhea, unspecified type - BRAT diet, avoid dairy  Rec r/s eye surgery (took aspirin -containing med, and sick/coughing). Repeat COVID test tomorrow if negative today. If +, treat with paxlovid, stop lipitor    Drink plenty of water. You can continue the alka-seltzer plus cold medications. Verify that it doesn't have either guaifenesin or dextromethorphan.  If it doesn't, then I recommend taking Mucinex DM 12 hour tablet twice daily. This loosens up the mucus and phlegm, and has a cough suppressant.  If the alka seltzer has dextromethorphan, then use a PLAIN mucinex (not the DM).  If COVID test is positive, we will treat with paxlovid (and you will need to stop atorvastatin  for the 5 days that you take the paxlovid). If you are positive for influenza, you are past the window where Tamiflu could help.  I'm not checking for RSV, since there is no separate treatment for this, just treating your symptoms. If COVID test is negative today, you can consider repeating the COVID test tomorrow if symptoms are worse.  BRAT diet for diarrhea--bananas, rice, applesauce and toast. Avoid dairy (milk, cheese) until your diarrhea has been better for a few days.  I encourage you to call the eye doctor--I'm sure they would want you to reschedule your surgery, given that you are sick and coughing. Also because your cold medication that you are taking has aspirin  in it, and you shouldn't have eye surgery while taking aspirin .  I highly encourage you to get the flu shot and pneumonia vaccine that we previously discussed and recommended.  I hope you feel better soon! Come for your flu and COVID testing as we discussed, and we will let you know those results via MyChart.   Follow Up  Instructions:    I discussed the assessment and treatment plan with the patient. The patient was provided an opportunity to ask questions and all were answered. The patient agreed with the plan and demonstrated an understanding of the instructions.   The patient was advised to call back or seek an in-person evaluation if the symptoms worsen or if the condition fails to improve as anticipated.  I spent 22 minutes dedicated to the care of this patient, including pre-visit review of records, face to face time, post-visit ordering of testing and documentation.    Annabelle DELENA Fetters, MD

## 2023-08-05 DIAGNOSIS — H25811 Combined forms of age-related cataract, right eye: Secondary | ICD-10-CM | POA: Diagnosis not present

## 2023-08-05 DIAGNOSIS — H52221 Regular astigmatism, right eye: Secondary | ICD-10-CM | POA: Diagnosis not present

## 2023-08-12 ENCOUNTER — Other Ambulatory Visit: Payer: Self-pay | Admitting: Obstetrics and Gynecology

## 2023-08-12 DIAGNOSIS — Z1231 Encounter for screening mammogram for malignant neoplasm of breast: Secondary | ICD-10-CM

## 2023-08-15 ENCOUNTER — Other Ambulatory Visit: Payer: BC Managed Care – PPO

## 2023-08-15 DIAGNOSIS — Z111 Encounter for screening for respiratory tuberculosis: Secondary | ICD-10-CM

## 2023-08-20 ENCOUNTER — Encounter: Payer: Self-pay | Admitting: Family Medicine

## 2023-08-20 LAB — QUANTIFERON-TB GOLD PLUS
QuantiFERON Mitogen Value: 3.04 [IU]/mL
QuantiFERON Nil Value: 0.01 [IU]/mL
QuantiFERON TB1 Ag Value: 0.02 [IU]/mL
QuantiFERON TB2 Ag Value: 0.01 [IU]/mL
QuantiFERON-TB Gold Plus: NEGATIVE

## 2023-08-24 ENCOUNTER — Encounter: Payer: Self-pay | Admitting: *Deleted

## 2023-08-24 ENCOUNTER — Telehealth: Payer: Self-pay | Admitting: Internal Medicine

## 2023-08-24 NOTE — Telephone Encounter (Signed)
Created mychart letter and sent to her

## 2023-08-24 NOTE — Telephone Encounter (Signed)
Copied from CRM [id number]. Topic: Clinical - Lab/Test Results >> Aug 24, 2023 11:38 AM Higinio Roger wrote: Reason for CRM: Patient would like a copy of her TB test results printed so she can come and picked them up. Please call patient at [phone], when it is ready.

## 2023-09-01 ENCOUNTER — Telehealth: Payer: Self-pay | Admitting: *Deleted

## 2023-09-01 LAB — HM DIABETES EYE EXAM

## 2023-09-01 NOTE — Telephone Encounter (Signed)
 Copied from CRM [id number]. Topic: General - Other >> Sep 01, 2023  9:29 AM Epimenio Foot F wrote: Reason for CRM: Patient is calling in requesting that [place] give her a call.  Called patient back she wanted copies of her TSH.

## 2023-09-03 HISTORY — PX: CATARACT EXTRACTION: SUR2

## 2023-09-08 ENCOUNTER — Ambulatory Visit: Payer: BC Managed Care – PPO | Admitting: Family Medicine

## 2023-09-21 ENCOUNTER — Ambulatory Visit
Admission: RE | Admit: 2023-09-21 | Discharge: 2023-09-21 | Disposition: A | Discharge status: Home / Self Care | Payer: BC Managed Care – PPO | Source: Ambulatory Visit | Attending: Obstetrics and Gynecology | Admitting: Obstetrics and Gynecology

## 2023-09-21 DIAGNOSIS — Z1231 Encounter for screening mammogram for malignant neoplasm of breast: Secondary | ICD-10-CM

## 2023-09-26 ENCOUNTER — Other Ambulatory Visit: Payer: Self-pay | Admitting: Family Medicine

## 2023-09-26 DIAGNOSIS — N898 Other specified noninflammatory disorders of vagina: Secondary | ICD-10-CM | POA: Diagnosis not present

## 2023-09-26 DIAGNOSIS — R1032 Left lower quadrant pain: Secondary | ICD-10-CM | POA: Diagnosis not present

## 2023-09-26 DIAGNOSIS — Z1330 Encounter for screening examination for mental health and behavioral disorders, unspecified: Secondary | ICD-10-CM | POA: Diagnosis not present

## 2023-09-26 DIAGNOSIS — M948X9 Other specified disorders of cartilage, unspecified sites: Secondary | ICD-10-CM | POA: Diagnosis not present

## 2023-09-26 DIAGNOSIS — Z01419 Encounter for gynecological examination (general) (routine) without abnormal findings: Secondary | ICD-10-CM | POA: Diagnosis not present

## 2023-09-28 DIAGNOSIS — Z13820 Encounter for screening for osteoporosis: Secondary | ICD-10-CM | POA: Diagnosis not present

## 2023-09-28 DIAGNOSIS — Z780 Asymptomatic menopausal state: Secondary | ICD-10-CM | POA: Diagnosis not present

## 2023-09-28 LAB — HM DEXA SCAN: HM Dexa Scan: NORMAL

## 2023-10-11 DIAGNOSIS — R1032 Left lower quadrant pain: Secondary | ICD-10-CM | POA: Diagnosis not present

## 2023-10-11 DIAGNOSIS — N760 Acute vaginitis: Secondary | ICD-10-CM | POA: Diagnosis not present

## 2023-10-12 IMAGING — MG MM DIGITAL SCREENING BILAT W/ TOMO AND CAD
8 series · 8 of 24 positions shown · non-contrast
Comparison: Previous exam(s).

CLINICAL DATA: Screening.

EXAM:
DIGITAL SCREENING BILATERAL MAMMOGRAM WITH TOMOSYNTHESIS AND CAD
TECHNIQUE: Bilateral screening digital craniocaudal and mediolateral oblique
mammograms were obtained. Bilateral screening digital breast
tomosynthesis was performed. The images were evaluated with
computer-aided detection.

[R MLO synth-2D]
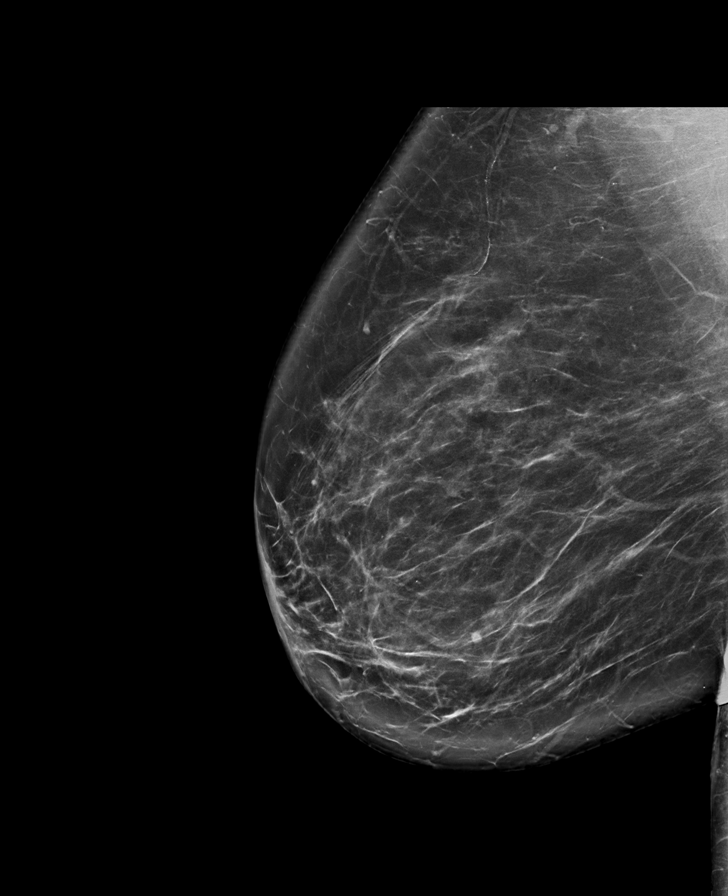

[L CC synth-2D]
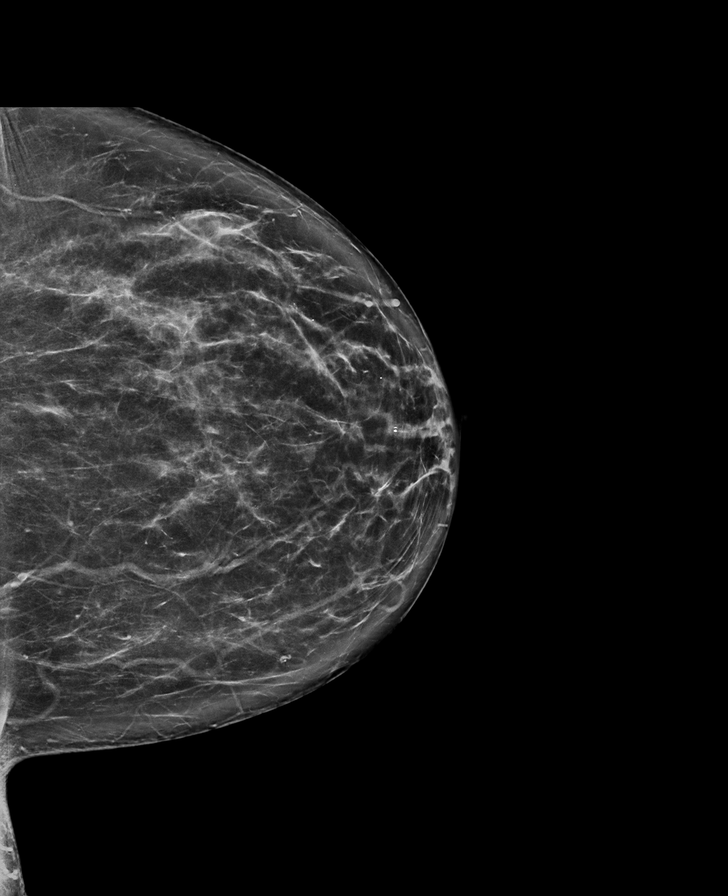

[R CC synth-2D]
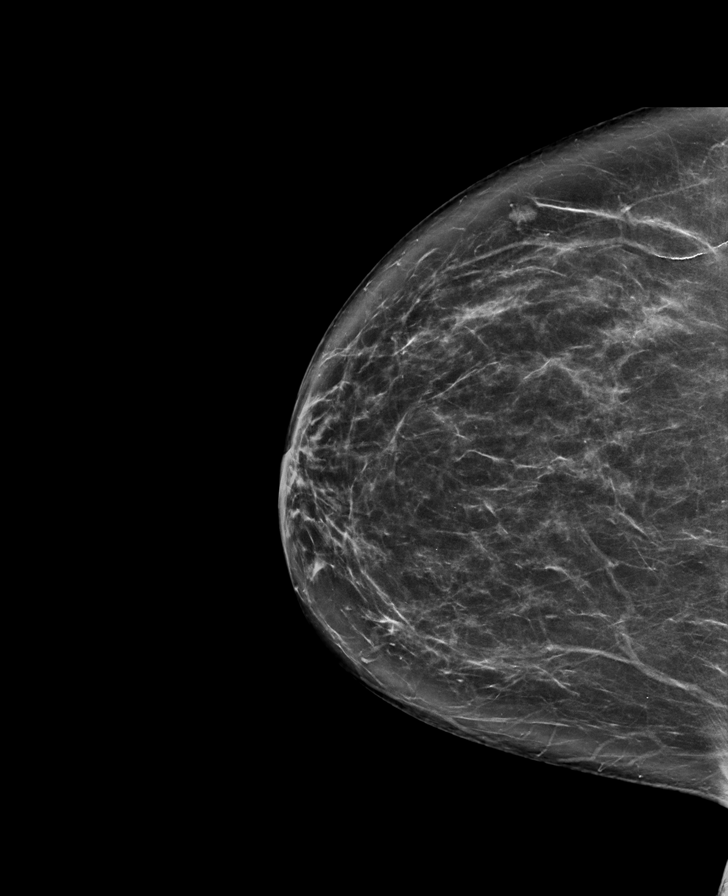

[L MLO synth-2D]
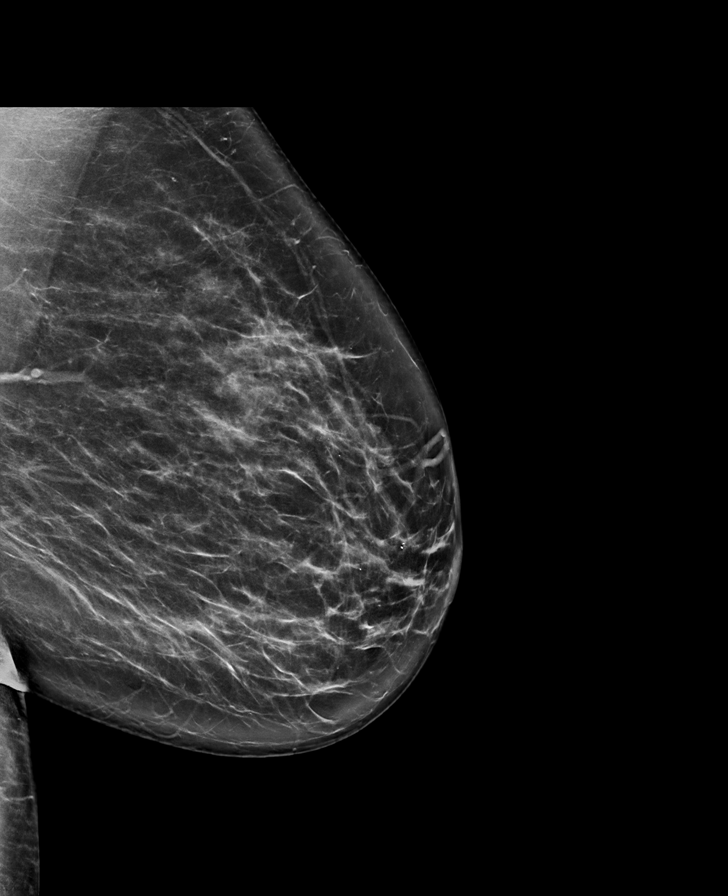

[R MLO tomo · tomo slice 51/101.0]
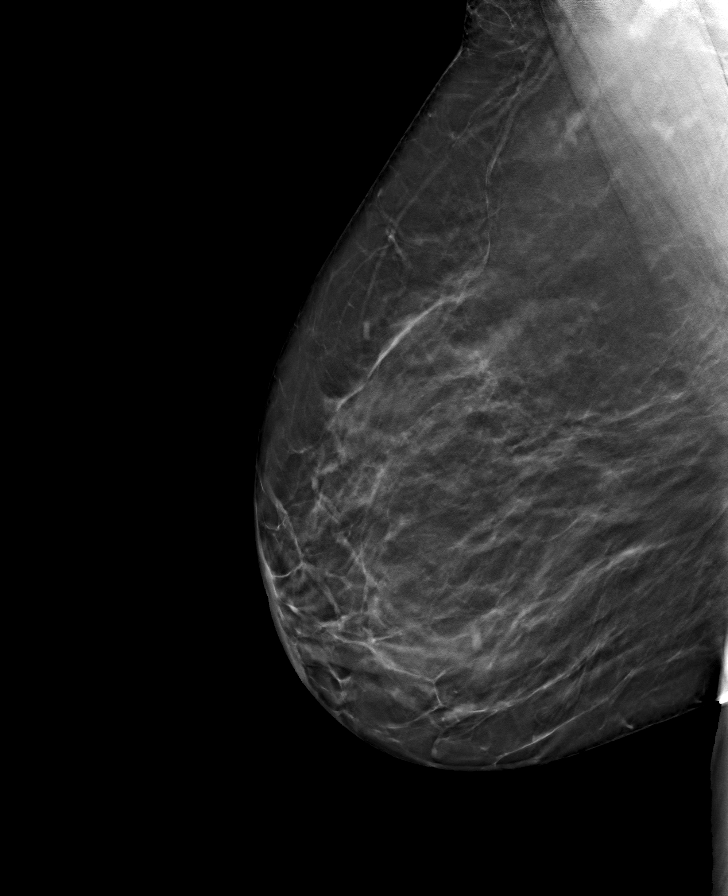

[L MLO tomo · tomo slice 45/90.0]
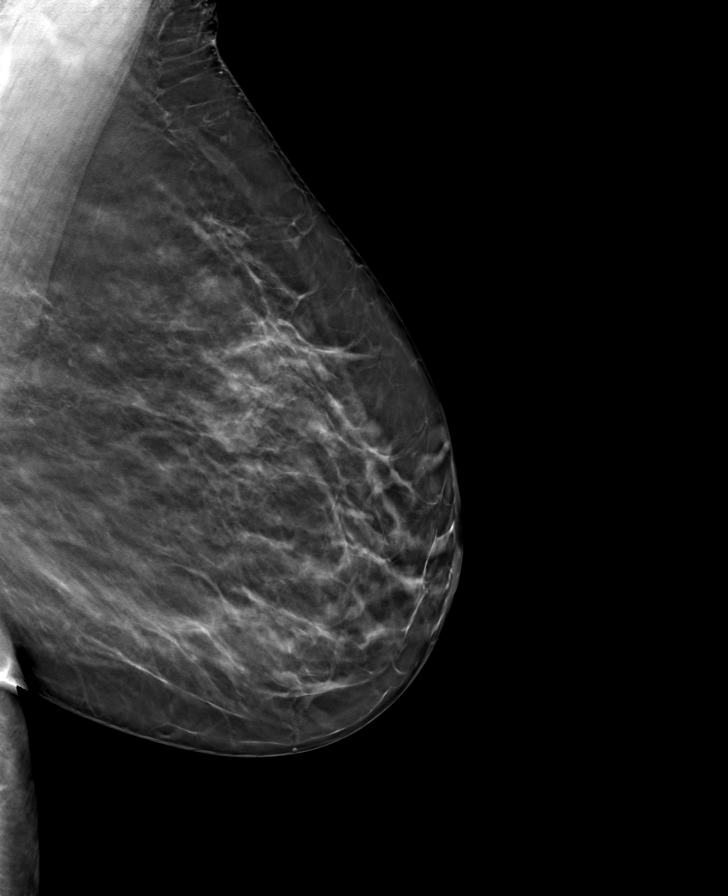

[L CC tomo · tomo slice 42/83.0]
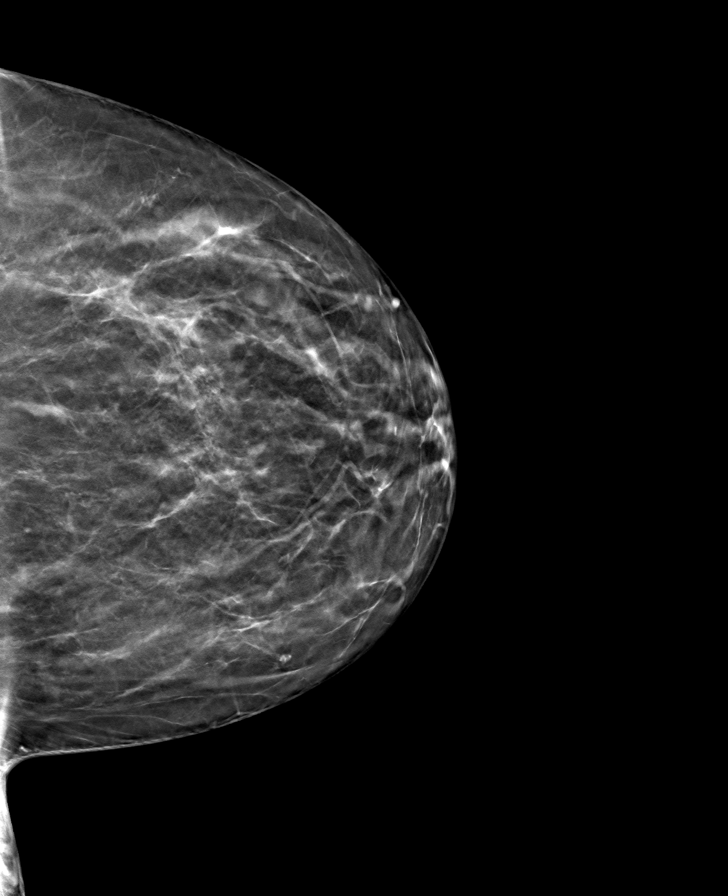

[R CC tomo · tomo slice 45/88.0]
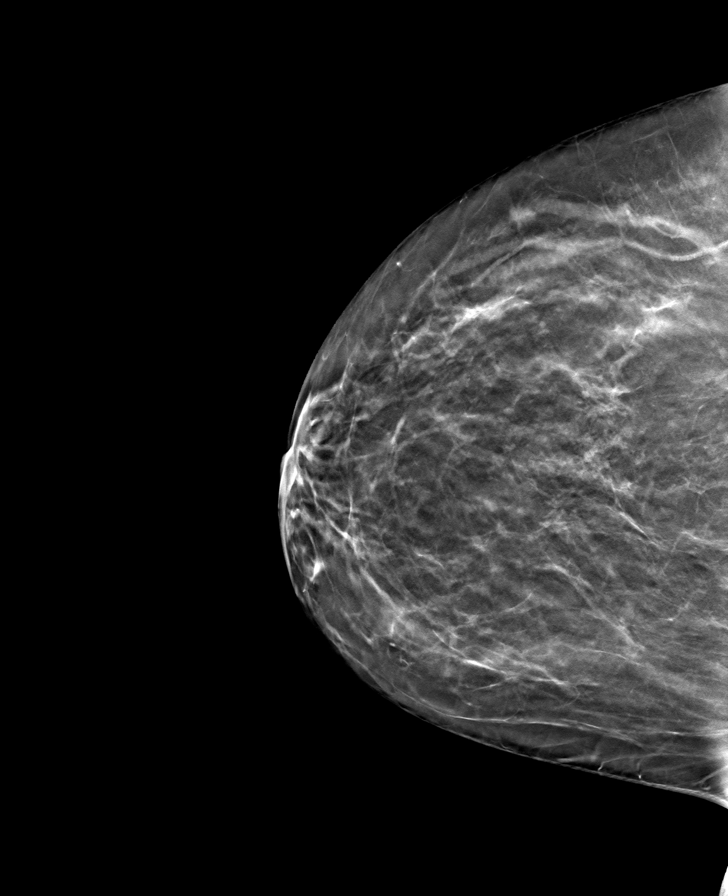

[8 of 24 positions shown; findings below may reference images not displayed]

ACR Breast Density Category b: There are scattered areas of
fibroglandular density.
FINDINGS: In the left breast, calcifications warrant further evaluation. In
the right breast, no findings suspicious for malignancy.
IMPRESSION: Further evaluation is suggested for calcifications in the left
breast.

RECOMMENDATION:
Diagnostic mammogram of the left breast. (Code:[id number])

The patient will be contacted regarding the findings, and additional
imaging will be scheduled.

BI-RADS CATEGORY  0: Incomplete. Need additional imaging evaluation
and/or prior mammograms for comparison.

## 2023-11-03 ENCOUNTER — Other Ambulatory Visit: Payer: Self-pay | Admitting: Family Medicine

## 2023-11-03 ENCOUNTER — Telehealth: Payer: Self-pay | Admitting: *Deleted

## 2023-11-03 DIAGNOSIS — E7800 Pure hypercholesterolemia, unspecified: Secondary | ICD-10-CM

## 2023-11-03 NOTE — Telephone Encounter (Signed)
 Patient scheduled CPE for 01/12/24 and labs for 01/09/24-need orders please.

## 2023-11-04 NOTE — Telephone Encounter (Signed)
 I prefer NOT to do that. She sees so many other providers, who also check labs (ie has an upcoming appointment with endo later this month, sees nephro, etc). I don't want to duplicate care.  She doesn't need to come fasting for her physical if she doesn't want, but can return later for fasting labs, if needed.

## 2023-11-07 NOTE — Telephone Encounter (Signed)
 Patient advised and lab appt cx.

## 2023-11-17 DIAGNOSIS — E785 Hyperlipidemia, unspecified: Secondary | ICD-10-CM | POA: Diagnosis not present

## 2023-11-17 DIAGNOSIS — Z7985 Long-term (current) use of injectable non-insulin antidiabetic drugs: Secondary | ICD-10-CM | POA: Diagnosis not present

## 2023-11-17 DIAGNOSIS — E1165 Type 2 diabetes mellitus with hyperglycemia: Secondary | ICD-10-CM | POA: Diagnosis not present

## 2023-11-17 DIAGNOSIS — Z79899 Other long term (current) drug therapy: Secondary | ICD-10-CM | POA: Diagnosis not present

## 2023-11-17 DIAGNOSIS — E119 Type 2 diabetes mellitus without complications: Secondary | ICD-10-CM | POA: Diagnosis not present

## 2023-11-17 DIAGNOSIS — E7800 Pure hypercholesterolemia, unspecified: Secondary | ICD-10-CM | POA: Diagnosis not present

## 2023-11-17 LAB — HEMOGLOBIN A1C: Hemoglobin A1C: 6.4

## 2023-11-17 LAB — MICROALBUMIN, URINE: Microalb, Ur: 8

## 2023-11-17 LAB — PROTEIN / CREATININE RATIO, URINE: Creatinine, Urine: 174

## 2023-11-17 LAB — MICROALBUMIN / CREATININE URINE RATIO: Microalb Creat Ratio: 5

## 2023-11-30 ENCOUNTER — Telehealth: Payer: Self-pay | Admitting: *Deleted

## 2023-11-30 NOTE — Telephone Encounter (Signed)
 Copied from CRM [id number]. Topic: General - Call Back - No Documentation >> Nov 30, 2023 11:32 AM Heather  M wrote: Reason for CRM: Patient states she is returning a call from Adeja Sarratt and requests a call back asap.  Called patient back she is for a physical cx.

## 2023-12-11 ENCOUNTER — Other Ambulatory Visit: Payer: Self-pay | Admitting: Internal Medicine

## 2023-12-26 DIAGNOSIS — M941 Relapsing polychondritis: Secondary | ICD-10-CM | POA: Diagnosis not present

## 2023-12-26 DIAGNOSIS — M1991 Primary osteoarthritis, unspecified site: Secondary | ICD-10-CM | POA: Diagnosis not present

## 2023-12-26 DIAGNOSIS — M0609 Rheumatoid arthritis without rheumatoid factor, multiple sites: Secondary | ICD-10-CM | POA: Diagnosis not present

## 2023-12-26 DIAGNOSIS — Z79899 Other long term (current) drug therapy: Secondary | ICD-10-CM | POA: Diagnosis not present

## 2023-12-30 ENCOUNTER — Other Ambulatory Visit: Payer: Self-pay | Admitting: Family Medicine

## 2024-01-09 ENCOUNTER — Ambulatory Visit: Payer: Self-pay | Admitting: Internal Medicine

## 2024-01-09 ENCOUNTER — Ambulatory Visit: Payer: Self-pay

## 2024-01-09 NOTE — Telephone Encounter (Signed)
 FYI Only or Action Required?: Action required by provider: request for appointment.  Patient was last seen in primary care on 07/14/2023 by Randol Dawes, MD.  Called Nurse Triage reporting Chest Pain.  Symptoms began unknown amount of time.  Interventions attempted: Rest, hydration, or home remedies.  Symptoms are: unchanged.  Triage Disposition: Go to ED or PCP/Alternative with Approval  Patient/caregiver understands and will follow disposition?: Yes    Copied from CRM [id number]. Topic: Appointments - Scheduling Inquiry for Clinic >> Jan 09, 2024  2:13 PM Whitney O wrote: Reason for CRM: patient is calling cause she was having chest pains . Nurse triage had a long wait and I kept checking in on patient she said just to make a appointment . Tried to schedule appointment but the appointment was with dr wert and she already have a appointment early morning . Didn't see anything for later did offer another location patient refused and said she would go to urgent care . If there is anyway to get the patient in on a later time tomorrow please give patient a call Reason for Disposition  [1] Chest pain lasts > 5 minutes AND [2] occurred in past 3 days (72 hours) (Exception: Feels exactly the same as previously diagnosed heartburn and has accompanying sour taste in mouth.)  Answer Assessment - Initial Assessment Questions 1. LOCATION: Where does it hurt?       Certain of the chest going around the breast area 2. RADIATION: Does the pain go anywhere else? (e.g., into neck, jaw, arms, back)     No radiation 3. ONSET: When did the chest pain begin? (Minutes, hours or days)      Has been going on for a minute. 4. PATTERN: Does the pain come and go, or has it been constant since it started?  Does it get worse with exertion?      constant 5. DURATION: How long does it last (e.g., seconds, minutes, hours)     All the time 6. SEVERITY: How bad is the pain?  (e.g., Scale 1-10; mild,  moderate, or severe)    - MILD (1-3): doesn't interfere with normal activities     - MODERATE (4-7): interferes with normal activities or awakens from sleep    - SEVERE (8-10): excruciating pain, unable to do any normal activities       6 out of 10 7. CARDIAC RISK FACTORS: Do you have any history of heart problems or risk factors for heart disease? (e.g., angina, prior heart attack; diabetes, high blood pressure, high cholesterol, smoker, or strong family history of heart disease)     No per patient 8. PULMONARY RISK FACTORS: Do you have any history of lung disease?  (e.g., blood clots in lung, asthma, emphysema, birth control pills)     No per patient 9. CAUSE: What do you think is causing the chest pain?     unsure 10. OTHER SYMPTOMS: Do you have any other symptoms? (e.g., dizziness, nausea, vomiting, sweating, fever, difficulty breathing, cough)       no  Protocols used: Chest Pain-A-AH

## 2024-01-10 NOTE — Telephone Encounter (Signed)
 Pt is going to [facility].

## 2024-01-11 NOTE — Progress Notes (Signed)
 Chief Complaint  Patient presents with   Annual Exam    CPE, having chest pains for about two weeks, when coughs or sneezes it hurts and now it has moved around to the back, wet prep, discharge and order, frequent urination, pt. Refused prevnar 20 and did not go to urgent care, been over a year since Dr. Darcel, last apt with obgyn was 10/11/2023 had an US , had PAP smear 09/26/2023   Michelle Mack is a 58 y.o. female who presents for a complete physical.    She had called her pulmonary doctor on 7/7 with complaints of chest pain. Couldn't get appt that day, stated she would go to UC, didn't go. She has been having the pain for 2 weeks.  She hasn't tried any heat, ice or any topical medications, nor tylenol  for her pain. Pain has now radiated to her back. It hurts the chest and back with coughing and sneezing. Hurts to bend/twist.  It feels a little better for a bit after taking her prednisone . She hasn't tapered her prednisone , still on 7 mg daily. She has been working out at [employer], and had recently increased her weights (prior to onset of pain).  She has continued to work out over the last 2 weeks, pushes through the pain. Denies pain with breathing.  Denies any change with exertion, just has constant pain. Describes it as achey soreness, like a twisting sensation.    Hypertension:  She is currently taking 5mg  amlodipine , and Benicar  HCT 20/12.5, and denies side effects. BP's are not checked elsewhere, just other doctors, and have been normal. She denies edema, dizziness, headaches., muscle cramps/spasms.   Continues to have occasional palpitations. No longer has any DOE (used to with stairs.) Chest pain x 2 weeks as reported above.  BP Readings from Last 3 Encounters:  01/12/24 120/72  06/20/23 120/62  05/06/23 100/67    Diabetes:  She is under the care of Dr. Hyacinth (at [facility]). Last A1c was 6.4% 11/17/23 at endo. She remains on ozempic 1 mg, no other diabetes  medications. Sugars are running 98-120 in the mornings, 137 after eating. She remains on chronic prednisone   She no longer sees podiatrist regularly. She checks her feet regularly and denies concerns. Last eye exam was 08/2023 no retinopathy. (Dr. Octavia)  (She previously was prescribed Farxiga , stopped due to vaginitis and UTI. Also previously prescribed Tradjenta , stopped due to diarrhea. Metformin  was stopped, d/t Ozempic controlling DM well, not for any SE)  Hyperlipidemia:  She reports compliance with atorvastatin , denies side effects. She had cardiac cath in 07/2018 after hospitalized with chest pain. She had normal coronary arteries.   Last lipids were at goal, done 11/17/23 at [facility]:  Component 11/17/23 11/08/22  Cholesterol, Total, Lipid Panel 130  130   Triglycerides, Lipid Panel 83  88   HDL Cholesterol - Lipid Panel 53 Low   55 Low    LDL Cholesterol, Calculated 60  58   Non-HDL Cholesterol 77 75    H/o vitamin D  deficiency: Last level was 32.9 in April 2024, when she had been out of her D3. Had been 50 in 09/2021 when taking D3 daily.   She had been taking 2000 IU of vitamin D3 until she ran out, a few months ago.  Had been getting through the pharmacy from a prescription.  Rx was written for #90 with 3RF on 8/7, shouldn't have run out.  Component Ref Range & Units (hover) 1 yr ago (10/06/22) 7 yr  ago (08/23/16) 8 yr ago (11/26/15) 8 yr ago (08/27/15) 8 yr ago (06/10/15) 10 yr ago (11/12/13) 11 yr ago (05/15/12)  Vit D, 25-Hydroxy 32.9 39 R, CM 26 Low  R, CM 35 R, CM 14 Low  R, CM 28 Low  R, CM 28 Low  R, CM     Tracheomalacia, chronic cough and h/o OSA.  She reports doing well.  She no longer has any DOE with stairs. Denies significant cough, shortness of breath. She last had in-lab sleep study in 01/2022 which did not show OSA, just loud snoring.  Relapsing polychondritis, seronegative RA, OA, lumbar DDD--under the care of Dr. Ishmael, managed with methotrexate , Enbrel and  prednisone . Flareups of pain and swelling in her hands and wrists have improved on this regimen.  She wants her to be able to taper down to 5 mg of prednisone  (had been on 7mg  prior to last visit with rheum, had run out of 2 mg so went up to 10 mg).  Every time she tries to taper, she gets recurrent chest pain.  She was prescribed Plaquenil at her last visit, but didn't tolerate it due to side effects. She plans to re-try it after she returns from her vacation [place]. .    Other doctors caring for patient: Endo: Dr. Hyacinth GYN: Dr. Darcel  Uro-gyn: Dr. Marilynne Rheum: Dr. Ishmael Pulm:  Dr. Meade [facility]) and NP [place] Podiatrist: Dr. Gershon (no longer sees) GI: Dr. Kristie Dentist: Dr. Devota Ophtho: Dr. Octavia Cardiology: Dr. Ladona (normal cath 07/2018) Nephro: Dr. Tobie at [facility]  Ortho: Dr. Harden (knees) Plastic surgery: Dr. Elisabeth (had breast reduction and abdominoplasty consult 06/2021)--had mammo, needs to get blood sugars down.  Immunization History  Administered Date(s) Administered   Hepatitis A 12/27/2007, 01/24/2008, 07/17/2008   Hepatitis B 12/27/2007, 01/24/2008, 07/17/2008   Hepatitis B, PED/ADOLESCENT 12/27/2007, 01/24/2008, 07/17/2008   IPV 12/27/2007   MMR 12/27/2007   Meningococcal polysaccharide vaccine (MPSV4) 12/27/2007   PFIZER(Purple Top)SARS-COV-2 Vaccination 09/18/2019, 10/09/2019, 06/17/2020   PPD Test 04/14/2011, 06/08/2013, 01/01/2015, 03/08/2017   Td 12/28/1995   Td (Adult),5 Lf Tetanus Toxid, Preservative Free 12/28/1995   Tdap 12/27/2007, 04/11/2019   Typhoid Inactivated 12/27/2007   Varicella 12/27/2007   Yellow Fever 01/24/2008    She refuses flu shots, COVID boosters Last Pap smear: done by Dr. Darcel in 09/2023  Last mammogram: 09/2023 Last colonoscopy: 12/2016 with Dr. Kristie. Internal hemorrhoids, repeat 10 years Last DEXA: 09/2023 normal (lowest was T-0.6 L fem neck) Dentist: goes yearly Ophtho: 08/2023 Dr. Octavia; goes twice yearly Exercise: walks 2  miles 2-3 times/week. Goes to the gym 2x/week, lifts weights and treadmill x 30 mins (walking).    PMH, PSH, SH and FH were reviewed and updated   Outpatient Encounter Medications as of 01/12/2024  Medication Sig Note   atorvastatin  (LIPITOR) 20 MG tablet TAKE 1 TABLET BY MOUTH EVERY DAY    folic acid  (FOLVITE ) 1 MG tablet TAKE 1 TABLET BY MOUTH EVERY DAY    methotrexate  (RHEUMATREX) 5 MG tablet Take 5 tablets (25 mg total) by mouth once a week. Caution:Chemotherapy. Protect from light. 03/28/2023: 50mg  once a week   olmesartan -hydrochlorothiazide  (BENICAR  HCT) 20-12.5 MG tablet TAKE 1 TABLET BY MOUTH EVERY DAY    OZEMPIC, 1 MG/DOSE, 4 MG/3ML SOPN Inject 1 mg into the skin once a week.    predniSONE  (DELTASONE ) 1 MG tablet Take 2 mg by mouth daily with breakfast.    predniSONE  (DELTASONE ) 5 MG tablet TAKE 2 TABLETS BY  MOUTH DAILY WITH BREAKFAST. 01/12/2024: Taking one 5 mg tablet daily, along with 2 mg (total dose of 7 mg daily).   amLODipine  (NORVASC ) 5 MG tablet TAKE 1 TABLET BY MOUTH EVERY DAY    Chlorphen-Phenyleph-ASA (ALKA-SELTZER PLUS COLD) 2-7.8-325 MG TBEF Take 2 each by mouth as needed. (Patient not taking: Reported on 01/12/2024) 07/14/2023: Last dose last night   Cholecalciferol (VITAMIN D3) 50 MCG (2000 UT) capsule TAKE 1 CAPSULE BY MOUTH EVERY DAY (Patient not taking: Reported on 01/12/2024) 01/12/2024: Ran out a few months ago   ENBREL SURECLICK 50 MG/ML injection Inject 50 mg into the skin once a week. 01/12/2024: From rheum   [DISCONTINUED] metFORMIN  (GLUCOPHAGE -XR) 500 MG 24 hr tablet Take 1 tablet by mouth 2 (two) times daily with a meal. (Patient not taking: Reported on 01/12/2024) 01/12/2024: no longer needed; ozempic controlling DM   No facility-administered encounter medications on file as of 01/12/2024.     Allergies  Allergen Reactions   Ampicillin Shortness Of Breath   Penicillins Shortness Of Breath and Swelling       Sulfamethoxazole -Trimethoprim  Other (See Comments)     body on fire   Wax [Beeswax]     Hair wax reaction/bump skin   Cephalosporins Itching   Elemental Sulfur Other (See Comments)    Feeling of burning on the inside  Persist ant cough   Mobic  [Meloxicam ] Swelling and Rash    Pt states lips also swelled.   Nitrofuran Derivatives Itching    Throat, mouth, hands itch      ROS: The patient denies anorexia, headaches, vision changes, decreased hearing, ear pain, sore throat, breast concerns, palpitations, dizziness, syncope, dyspnea on exertion, swelling, nausea, vomiting, diarrhea, abdominal pain, melena, hematochezia, indigestion/heartburn, hematuria, dysuria,vaginal bleeding, vaginal discharge, genital lesions, numbness, tingling, weakness, tremor, suspicious skin lesions, depression, anxiety, abnormal bleeding/bruising, or enlarged lymph nodes.  Had R cataract removed in March.  Eyeball popped out on the left when they tried cataract surgery. Has occ L eye pain with certain movements.  Doesn't notice much improvement in vision from the surgery. No dysuria. Chronic microscopic hematuria. She has some increased urinary frequency, but is drinking more water. No longer having stress incontinence.  Previously used pessary with good results.  Still has it at home, if needed. Knees had improved, but flared some with recent increase in weight lifting at the gym. Denies hot flashes, vaginal spotting or bleeding (s/p hyst). She notes some vaginal discharge--there is an odor.  The discharge is chronic, creamy, thin, no itching, not discolored. She would like this evaluated today. Hand/wrist arthritis is better on current med regimen. +constipation.   PHYSICAL EXAM:  BP 120/72   Pulse 78   Ht 5' 3.75 (1.619 m)   Wt 163 lb 9.6 oz (74.2 kg)   LMP 10/09/2015   BMI 28.30 kg/m   Wt Readings from Last 3 Encounters:  01/12/24 163 lb 9.6 oz (74.2 kg)  07/14/23 155 lb (70.3 kg)  06/20/23 158 lb (71.7 kg)    General Appearance:    Alert,  cooperative, no distress, appears stated age.   Head:    Normocephalic, without obvious abnormality, atraumatic  Eyes:    PERRL, conjunctiva/corneas clear, EOM's intact, fundi benign. Slight proptotic appearance of eyes, chronic  Ears:    Normal TM's and external ear canals.  Nose:   No drainage or sinus tenderness  Throat:   OP clear with normal mucosa  Neck:   Supple, no lymphadenopathy;  thyroid :  no enlargement/ tenderness/nodules;  no carotid bruit or JVD  Back:    Spine nontender, no curvature, ROM normal, no CVA tenderness. She is tender across the entire trapezius muscles bilaterally, rhomboids, and thoracic paraspinous muscles, bilaterally.  No knots/spasm.  Lungs:     Clear to auscultation bilaterally without wheezes, rales or ronchi; respirations unlabored  Chest Wall:    No deformity.  Tender at 2-3 levels at left costochondral junction (breast level). Nontender on the right.   Heart:    Regular rate and rhythm, S1 and S2 normal, no murmur, rub or gallop  Breast Exam:    Deferred to GYN  Abdomen:     Soft, non-tender, nondistended, normoactive bowel sounds, no masses, no hepatosplenomegaly  Genitalia:    External genitalia normal, no lesions.  Small-mod amount of thin white discharge, without odor.  Rectal:    deferred to GYN  Extremities:   No clubbing, cyanosis or edema.  +bunions bilaterally, and callous at medial great toes. Normal monofilament exam.  Pulses:   2+ and symmetric all extremities  Skin:   Skin color, texture, turgor normal, no rashes or lesions  Lymph nodes:   Cervical, supraclavicular and inguinal nodes normal  Neurologic:   Normal strength, sensation and gait; reflexes 2+ and symmetric throughout                                Psych:   Normal mood, affect, hygiene and grooming     Had cbc and c-met 6/23 at rheum. Had A1c, urine albumin/Cr and lipids 11/17/23 at endo    ASSESSMENT/PLAN:   Annual physical exam  Essential hypertension, benign - BP controlled  on current regimen - Plan: amLODipine  (NORVASC ) 5 MG tablet  Type 2 diabetes mellitus with stage 2 chronic kidney disease, without long-term current use of insulin (HCC) - Well controlled on ozempic, continue - Plan: TSH  Pure hypercholesterolemia - recent lipids at goal. Continue atorvastatin   Vitamin D  deficiency - recheck level, hasn't been taking supplement. Encouraged daily supplement - Plan: VITAMIN D  25 Hydroxy (Vit-D Deficiency, Fractures), Cholecalciferol (VITAMIN D3) 50 MCG (2000 UT) capsule  Relapsing polychondritis - under care of rheum, continue current regimen. Plan is to try and taper prednisone  down slowly  Seronegative rheumatoid arthritis (HCC) - under care of rheum.  Improved with Enbrel, methotrexate . Planning to add plaquenil to try and be able to taper prednisone   Frequent urination - denies dysuria. has chronic microscopic hematuria. U/a otherwise normal - Plan: POCT URINALYSIS DIP (CLINITEK), NuSwab Vaginitis Plus (VG+)  Constipation, unspecified constipation type - discussed importance of high fiber diet, adequate water intake, exercise, miralax  - Plan: TSH  Vaginal discharge - suspect physiologic based on appearance. Similar to last check (and Nuswab was normal).  NuSwab sent again, per pt request  Costochondritis - encouraged moist heat to L chest. To take a break from upper body weights to allow healing  Myalgia - related to changes in weights at the gym.  Discussed OTC/topical measures prn for pain, rest, heat, massage and stretches   Discussed monthly self breast exams and yearly mammograms; at least 30 minutes of aerobic activity at least 5 days/week, weight-bearing exercise at least 2x/wk; proper sunscreen use reviewed; healthy diet, including goals of calcium  and vitamin D  intake and alcohol recommendations (less than or equal to 1 drink/day) reviewed; regular seatbelt use; changing batteries in smoke detectors, recommend carbon monoxide detectors. Immunization  recommendations discussed in detail--recommended flu shots yearly (  she refuses).   Pneumonia vaccine is recommended due to her DM (and underlying pulm issues).  Discussed in detail. Encouraged her to consider, and to discuss the best timing of the vaccine with her rheumatologist (d/t her immunosuppressant meds). Counseled regarding and encouraged Shingrix, declined. COVID bivalent booster recommended in the Fall Colonoscopy recommendations reviewed, UTD, due again 2028.  Asked CMA to request records from Dr. Darcel and pap results.   F/u here 1 year for CPE. Continue routine f/u with her specialists.

## 2024-01-11 NOTE — Patient Instructions (Incomplete)
  HEALTH MAINTENANCE RECOMMENDATIONS:  It is recommended that you get at least 30 minutes of aerobic exercise at least 5 days/week (for weight loss, you may need as much as 60-90 minutes). This can be any activity that gets your heart rate up. This can be divided in 10-15 minute intervals if needed, but try and build up your endurance at least once a week.  Weight bearing exercise is also recommended twice weekly.  Eat a healthy diet with lots of vegetables, fruits and fiber.  Colorful foods have a lot of vitamins (ie green vegetables, tomatoes, red peppers, etc).  Limit sweet tea, regular sodas and alcoholic beverages, all of which has a lot of calories and sugar.  Up to 1 alcoholic drink daily may be beneficial for women (unless trying to lose weight, watch sugars).  Drink a lot of water.  Calcium  recommendations are 1200-1500 mg daily (1500 mg for postmenopausal women or women without ovaries), and vitamin D  1000 IU daily.  This should be obtained from diet and/or supplements (vitamins), and calcium  should not be taken all at once, but in divided doses.  Monthly self breast exams and yearly mammograms for women over the age of 81 is recommended.  Sunscreen of at least SPF 30 should be used on all sun-exposed parts of the skin when outside between the hours of 10 am and 4 pm (not just when at beach or pool, but even with exercise, golf, tennis, and yard work!)  Use a sunscreen that says broad spectrum so it covers both UVA and UVB rays, and make sure to reapply every 1-2 hours.  Remember to change the batteries in your smoke detectors when changing your clock times in the spring and fall. Carbon monoxide detectors are recommended for your home.  Use your seat belt every time you are in a car, and please drive safely and not be distracted with cell phones and texting while driving.  Yearly flu shots and COVID boosters are recommended.  Pneumonia vaccine (prevnar-20) is recommended. This is  recommended for everybody age 55 and up (this is a new recommendation), and also due to your diabetes, being on immunosuppressant medications. You can discuss the timing of when best to get the vaccine (if/when you're interested) with your rheumatologist.  We are sending in another prescription for the over-the-counter dose (2000 IU) of vitamin D . We had sent in a year's prescription in 02/2023.  I'm not sure why they stopped filling it for you. If you ever notice that you aren't getting it, please check with the pharmacy (they should have had refills for you).  Back off on the weights to allow your pain to improve. Lighten up the weights when you resume, and ensure that you are using proper form.  Use tylenol  as needed for pain. Heating pad or patches (ThermaCare). You can also use Salonpas patches with lidocaine  (or other store brand, 4% lidocaine ). You can also try Biofreeze as a topical medication to help with pain.

## 2024-01-12 ENCOUNTER — Encounter: Payer: Self-pay | Admitting: Family Medicine

## 2024-01-12 ENCOUNTER — Ambulatory Visit: Payer: Self-pay | Admitting: Family Medicine

## 2024-01-12 VITALS — BP 120/72 | HR 78 | Ht 63.75 in | Wt 163.6 lb

## 2024-01-12 DIAGNOSIS — M791 Myalgia, unspecified site: Secondary | ICD-10-CM

## 2024-01-12 DIAGNOSIS — M0600 Rheumatoid arthritis without rheumatoid factor, unspecified site: Secondary | ICD-10-CM

## 2024-01-12 DIAGNOSIS — N182 Chronic kidney disease, stage 2 (mild): Secondary | ICD-10-CM | POA: Diagnosis not present

## 2024-01-12 DIAGNOSIS — Z0000 Encounter for general adult medical examination without abnormal findings: Secondary | ICD-10-CM

## 2024-01-12 DIAGNOSIS — R350 Frequency of micturition: Secondary | ICD-10-CM

## 2024-01-12 DIAGNOSIS — E7800 Pure hypercholesterolemia, unspecified: Secondary | ICD-10-CM

## 2024-01-12 DIAGNOSIS — E1122 Type 2 diabetes mellitus with diabetic chronic kidney disease: Secondary | ICD-10-CM

## 2024-01-12 DIAGNOSIS — E559 Vitamin D deficiency, unspecified: Secondary | ICD-10-CM | POA: Diagnosis not present

## 2024-01-12 DIAGNOSIS — I10 Essential (primary) hypertension: Secondary | ICD-10-CM | POA: Diagnosis not present

## 2024-01-12 DIAGNOSIS — M940 Chondrocostal junction syndrome [Tietze]: Secondary | ICD-10-CM

## 2024-01-12 DIAGNOSIS — K5900 Constipation, unspecified: Secondary | ICD-10-CM

## 2024-01-12 DIAGNOSIS — N898 Other specified noninflammatory disorders of vagina: Secondary | ICD-10-CM

## 2024-01-12 DIAGNOSIS — M941 Relapsing polychondritis: Secondary | ICD-10-CM

## 2024-01-12 LAB — POCT URINALYSIS DIP (CLINITEK)
Bilirubin, UA: NEGATIVE
Glucose, UA: NEGATIVE mg/dL
Ketones, POC UA: NEGATIVE mg/dL
Leukocytes, UA: NEGATIVE
Nitrite, UA: NEGATIVE
POC PROTEIN,UA: NEGATIVE
Spec Grav, UA: 1.01 (ref 1.010–1.025)
Urobilinogen, UA: 0.2 U/dL
pH, UA: 6 (ref 5.0–8.0)

## 2024-01-12 MED ORDER — AMLODIPINE BESYLATE 5 MG PO TABS: 5.0000 mg | ORAL_TABLET | Freq: Every day | ORAL | 1 refills | Status: AC

## 2024-01-12 MED ORDER — VITAMIN D3 50 MCG (2000 UT) PO CAPS: 2000.0000 [IU] | ORAL_CAPSULE | Freq: Every day | ORAL | 3 refills | Status: AC

## 2024-01-13 ENCOUNTER — Ambulatory Visit: Payer: Self-pay | Admitting: Family Medicine

## 2024-01-13 LAB — TSH: TSH: 1.9 u[IU]/mL (ref 0.450–4.500)

## 2024-01-13 LAB — VITAMIN D 25 HYDROXY (VIT D DEFICIENCY, FRACTURES): Vit D, 25-Hydroxy: 31.4 ng/mL (ref 30.0–100.0)

## 2024-01-14 LAB — NUSWAB VAGINITIS PLUS (VG+)
Candida albicans, NAA: NEGATIVE
Candida glabrata, NAA: NEGATIVE

## 2024-01-20 ENCOUNTER — Encounter: Payer: Self-pay | Admitting: Advanced Practice Midwife

## 2024-02-15 ENCOUNTER — Ambulatory Visit: Admitting: Family Medicine

## 2024-02-15 ENCOUNTER — Encounter: Payer: Self-pay | Admitting: Family Medicine

## 2024-02-15 VITALS — BP 126/84 | HR 89 | Ht 63.75 in | Wt 164.2 lb

## 2024-02-15 DIAGNOSIS — N939 Abnormal uterine and vaginal bleeding, unspecified: Secondary | ICD-10-CM

## 2024-02-15 DIAGNOSIS — E7800 Pure hypercholesterolemia, unspecified: Secondary | ICD-10-CM | POA: Diagnosis not present

## 2024-02-15 DIAGNOSIS — R3129 Other microscopic hematuria: Secondary | ICD-10-CM

## 2024-02-15 DIAGNOSIS — E559 Vitamin D deficiency, unspecified: Secondary | ICD-10-CM

## 2024-02-15 DIAGNOSIS — E1122 Type 2 diabetes mellitus with diabetic chronic kidney disease: Secondary | ICD-10-CM | POA: Diagnosis not present

## 2024-02-15 DIAGNOSIS — I10 Essential (primary) hypertension: Secondary | ICD-10-CM

## 2024-02-15 DIAGNOSIS — R519 Headache, unspecified: Secondary | ICD-10-CM | POA: Diagnosis not present

## 2024-02-15 DIAGNOSIS — M0600 Rheumatoid arthritis without rheumatoid factor, unspecified site: Secondary | ICD-10-CM

## 2024-02-15 DIAGNOSIS — M941 Relapsing polychondritis: Secondary | ICD-10-CM

## 2024-02-15 DIAGNOSIS — N182 Chronic kidney disease, stage 2 (mild): Secondary | ICD-10-CM

## 2024-02-15 MED ORDER — ATORVASTATIN CALCIUM 20 MG PO TABS: 20.0000 mg | ORAL_TABLET | Freq: Every day | ORAL | 2 refills | Status: AC

## 2024-02-15 NOTE — Patient Instructions (Addendum)
 HEADACHE: Your headaches time-wise seem like it could be related to your vitamin D  supplementation. -Stop taking vitamin D . -Restart your other medications.  -If your headaches do not return, reintroduce vitamin D  twice a week, and see if you can tolerate that without headaches.  If headaches return with resuming vitamin D , you may want to try and get a different formulation (consider a tablet, consider 1000 IU).  -Consult the pharmacy if your headaches come back with resuming your OTHER medications, to see if any of the generics could have switched suppliers.  POSTMENOPAUSAL VAGINAL BLEEDING: Your bleeding may be due to irritation from vaginal cream, but we need to rule out any serious conditions.

## 2024-02-15 NOTE — Progress Notes (Signed)
 Chief Complaint  Patient presents with   Headache    Pt stated that she having since last visit , she was told that her vit d was low, on labs she thinks this maybe the cause of headaches. -having pain in front.part of head when  having headaches - pt isn't taking any for pain  -pt is getting headaches are every day , starting mid day then  lasting until bedtime  - pt needs refill    Michelle Mack is a 58 year old female who presents with headaches.  Headaches began three weeks ago, located across the forehead and top of the head, equally on both sides, with no radiation to the back of the neck or temples, and no pain while chewing. There are no sinus or allergy symptoms. She suspects her headaches may be related to her medications, as she did not experience a headache when she stopped taking them for two days. She stopped taking ALL medications.  She thinks it might be from the vitamin D , which was started the most recently. Vitamin D  level was recently 31.4 ng/mL (01/12/2024)  Current medications include amlodipine , atorvastatin , vitamin D , Enbrel, methotrexate , folic acid , olmesartan  HCTZ, Ozempic, and prednisone . She recently stopped taking prednisone  after a cortisone shot in her left knee last Monday. She denies any change in caffeine intake.  Vision has not improved post-cataract surgery on the right eye three months ago and remains poor in both eyes. There are no changes in vision or straining of eyes which could contribute to headaches.  DM--sugars went up after cortisone injection, but are back down. Most recent sugar ws 127.  She recently noted vaginal bleeding.  She noted bright red blood on the tube after inserting vaginal estrogen.  It is no longer bleeding. She plans to contact her GYN about this.    PMH, PSH, SH reviewed  Outpatient Encounter Medications as of 02/15/2024  Medication Sig Note   amLODipine  (NORVASC ) 5 MG tablet Take 1 tablet (5 mg total) by mouth daily.     Cholecalciferol (VITAMIN D3) 50 MCG (2000 UT) capsule Take 1 capsule (2,000 Units total) by mouth daily.    ENBREL SURECLICK 50 MG/ML injection Inject 50 mg into the skin once a week. 01/12/2024: From rheum   folic acid  (FOLVITE ) 1 MG tablet TAKE 1 TABLET BY MOUTH EVERY DAY    methotrexate  (RHEUMATREX) 5 MG tablet Take 5 tablets (25 mg total) by mouth once a week. Caution:Chemotherapy. Protect from light. 03/28/2023: 50mg  once a week   olmesartan -hydrochlorothiazide  (BENICAR  HCT) 20-12.5 MG tablet TAKE 1 TABLET BY MOUTH EVERY DAY    OZEMPIC, 1 MG/DOSE, 4 MG/3ML SOPN Inject 1 mg into the skin once a week.    predniSONE  (DELTASONE ) 1 MG tablet Take 2 mg by mouth daily with breakfast.    predniSONE  (DELTASONE ) 5 MG tablet TAKE 2 TABLETS BY MOUTH DAILY WITH BREAKFAST. 01/12/2024: Taking one 5 mg tablet daily, along with 2 mg (total dose of 7 mg daily).   [DISCONTINUED] atorvastatin  (LIPITOR) 20 MG tablet TAKE 1 TABLET BY MOUTH EVERY DAY    atorvastatin  (LIPITOR) 20 MG tablet Take 1 tablet (20 mg total) by mouth daily.    Chlorphen-Phenyleph-ASA (ALKA-SELTZER PLUS COLD) 2-7.8-325 MG TBEF Take 2 each by mouth as needed. (Patient not taking: Reported on 02/15/2024)    No facility-administered encounter medications on file as of 02/15/2024.   Hasn't taken ANY of these meds for the last 2 days  Allergies  Allergen Reactions  Ampicillin Shortness Of Breath   Penicillins Shortness Of Breath and Swelling        Sulfamethoxazole -Trimethoprim  Other (See Comments)    body on fire   Wax [Beeswax]     Hair wax reaction/bump skin   Cephalosporins Itching   Elemental Sulfur Other (See Comments)    Feeling of burning on the inside  Persist ant cough   Mobic  [Meloxicam ] Swelling and Rash    Pt states lips also swelled.   Nitrofuran Derivatives Itching    Throat, mouth, hands itch    ROS: HA's per HPI.  Poor vision in both eyes, not improved with R cataract surgery. No recent changes. Vaginal  bleeding per HPI. No n/v/d, rashes. No recurrent chest pain or breathing issues since being off prednisone , no arthralgias.  Knee is feeling better since cortisone injection.   Moods are good.    PHYSICAL EXAM:  BP 126/84   Pulse 89   Ht 5' 3.75 (1.619 m)   Wt 164 lb 3.2 oz (74.5 kg)   LMP 10/09/2015   SpO2 94%   BMI 28.41 kg/m   Pleasant, well-appearing female, in good spirits, in no distress HEENT: conjunctiva and sclera are clear, EOMI, fundi benign. TM's and EAC's normal. Nasal mucosa without any significant edema, some clear mucus on the left. Sinuses nontender. Nontender at temporalis muscles and temporal arteries. OP is clear Neck: no lymphadenopathy or mass Heart: regular rate and rhythm Lungs: clear bilaterally Back: no spinal or CVA tenderness Neuro: alert and oriented, cranial nerves intact, DTR's symmetric, normal gait.    ASSESSMENT/PLAN:  Nonintractable headache, unspecified chronicity pattern, unspecified headache type - across top of head and frontal, resolved after stopping all of her meds x2 days.  Poss related to newest med, D supplement. Normal exam  Pure hypercholesterolemia - should be needing refill of atorvastatin , done today. - Plan: atorvastatin  (LIPITOR) 20 MG tablet  Essential hypertension, benign - BP normal today, despite not taking BP meds x 2d. She denied any dizziness or fatigue while on them. Encouraged to restart meds  Type 2 diabetes mellitus with stage 2 chronic kidney disease, without long-term current use of insulin (HCC) - continue ozempic, dose due today  Vitamin D  deficiency - low-normal on last check.  Daily supplementation recommended to maintain levels, esp in winter.  Hold D x 1 week, then resume 2x/week or switch to tab 1000 IU  Seronegative rheumatoid arthritis (HCC) - stopped meds from rheum, encouraged to restart these.  Relapsing polychondritis - has required daily prednisone , with difficulty tapering. Encouraged to resume  before she has a flare/pain develops  Microscopic hematuria - she was asking about last urine today, thinking recent u/a was worse. Advised it only showed moderate blood, prev large (not worse)  Vaginal bleeding - postmenopausal. Occurred after using tube to insert vaginal estrogen. ? related to trauma of tube, vs PMB. encouraged to f/u with GYN   Headache is possibly related to vitamin D  supplementation. - Stay off vitamin D  for now - Restart other medications. - Reintroduce vitamin D  twice weekly if headaches do not recur. - If headache recurs, can try different formulation (ie tablet instead of geltab, can lower to 1000 IU) - Consult pharmacy if headaches recur with resuming usual meds (prior to re-starting the D), to check to see if any generics have been switched.  Postmenopausal vaginal bleeding Bleeding possibly due to irritation from applicator for vaginal cream. Requires evaluation to rule out serious conditions. Pt encouraged to fu with her  GYN.  Rheumatoid arthritis Managed with methotrexate  and Enbrel.  - Restart methotrexate  and Enbrel.  Type 2 diabetes mellitus Blood sugars well-controlled. Temporary increase post-cortisone injection resolved. Ozempic dose due today.  Hypertension Blood pressure well-controlled. - Restart amlodipine  and olmesartan  HCTZ.  Hyperlipidemia Cholesterol levels excellent on last check.  - Refill atorvastatin  for 90 days with two refills. (Lipids will be due again in 11/2024).  Chronic microscopic hematuria Chronic condition with moderate blood on most recent urinalysis (previously had been large). Pt reassurred.

## 2024-03-16 DIAGNOSIS — M170 Bilateral primary osteoarthritis of knee: Secondary | ICD-10-CM | POA: Diagnosis not present

## 2024-03-19 ENCOUNTER — Telehealth: Payer: Self-pay | Admitting: Family Medicine

## 2024-03-19 DIAGNOSIS — E559 Vitamin D deficiency, unspecified: Secondary | ICD-10-CM | POA: Diagnosis not present

## 2024-03-19 DIAGNOSIS — I129 Hypertensive chronic kidney disease with stage 1 through stage 4 chronic kidney disease, or unspecified chronic kidney disease: Secondary | ICD-10-CM | POA: Diagnosis not present

## 2024-03-19 DIAGNOSIS — N1831 Chronic kidney disease, stage 3a: Secondary | ICD-10-CM | POA: Diagnosis not present

## 2024-03-19 DIAGNOSIS — D649 Anemia, unspecified: Secondary | ICD-10-CM | POA: Diagnosis not present

## 2024-03-19 NOTE — Telephone Encounter (Signed)
 Fax from [facility] for surgical clearance received by fax  Sent back in folder

## 2024-03-21 LAB — LAB REPORT - SCANNED
Albumin, Urine POC: 7.5
Albumin/Creatinine Ratio, Urine, POC: 4
Creatinine, POC: 181.5 mg/dL
EGFR: 62

## 2024-03-23 DIAGNOSIS — M1711 Unilateral primary osteoarthritis, right knee: Secondary | ICD-10-CM | POA: Diagnosis not present

## 2024-03-27 DIAGNOSIS — M941 Relapsing polychondritis: Secondary | ICD-10-CM | POA: Diagnosis not present

## 2024-03-27 DIAGNOSIS — Z79899 Other long term (current) drug therapy: Secondary | ICD-10-CM | POA: Diagnosis not present

## 2024-03-27 DIAGNOSIS — R079 Chest pain, unspecified: Secondary | ICD-10-CM | POA: Diagnosis not present

## 2024-03-27 DIAGNOSIS — M0609 Rheumatoid arthritis without rheumatoid factor, multiple sites: Secondary | ICD-10-CM | POA: Diagnosis not present

## 2024-03-27 DIAGNOSIS — M1991 Primary osteoarthritis, unspecified site: Secondary | ICD-10-CM | POA: Diagnosis not present

## 2024-03-27 NOTE — Telephone Encounter (Signed)
 I left 3 messages last week, Michelle Mack called me back one time and then I called her back and left message. Called again this morning and left yet another message explaining what I need and asking her to please call back.

## 2024-03-29 ENCOUNTER — Telehealth: Payer: Self-pay | Admitting: *Deleted

## 2024-03-29 NOTE — Telephone Encounter (Signed)
 Copied from CRM [id number]. Topic: Clinical - Medical Advice >> Mar 29, 2024  2:48 PM Gustabo D wrote: If doc doesn't need the clearance to do the labs to clear the pt they will be happy to draw them  Pt is experiencing new chest pain and needs to be seen for it before they can see her. [phone] Can leave a vm  Called [phone] which apparently is the Ortho office which from this message, I would have no idea who called. I left a message with Sherri letting her know that I faxed the clearance back today as we were aware that she was not cleared and Dr. Randol did not clear her neither. Once she sees cardiology and is cleared if they need us  to do anything and IF labs are needed they can let us  know.

## 2024-04-02 DIAGNOSIS — H01023 Squamous blepharitis right eye, unspecified eyelid: Secondary | ICD-10-CM | POA: Diagnosis not present

## 2024-04-02 DIAGNOSIS — H0520 Unspecified exophthalmos: Secondary | ICD-10-CM | POA: Diagnosis not present

## 2024-04-02 DIAGNOSIS — H2000 Unspecified acute and subacute iridocyclitis: Secondary | ICD-10-CM | POA: Diagnosis not present

## 2024-04-03 ENCOUNTER — Ambulatory Visit: Admitting: Pulmonary Disease

## 2024-04-03 VITALS — BP 110/80 | HR 77 | Temp 98.0°F | Ht 65.0 in | Wt 164.0 lb

## 2024-04-03 DIAGNOSIS — M941 Relapsing polychondritis: Secondary | ICD-10-CM

## 2024-04-03 DIAGNOSIS — J398 Other specified diseases of upper respiratory tract: Secondary | ICD-10-CM

## 2024-04-03 NOTE — Patient Instructions (Signed)
  VISIT SUMMARY: You came in today due to increased chest pain related to your relapsing polychondritis. We discussed your current treatment and the need for further management options.  YOUR PLAN: RELAPSING POLYCHONDRITIS: You have chronic relapsing polychondritis with increased chest pain over the past month. The pain is not relieved by your current medications and is reproducible upon touching the chest area, suggesting costochondritis. -Please check in with your rheumatology for further management. -Consider increasing your prednisone  dosage or exploring alternative immunosuppressive therapy under the guidance of your rheumatologist.  SERONEGATIVE RHEUMATOID ARTHRITIS: You have seronegative rheumatoid arthritis, which is currently managed with methotrexate  and Enbrel. -Continue your current management with rheumatology.

## 2024-04-03 NOTE — Progress Notes (Unsigned)
 CARDIOLOGY CONSULT NOTE       Patient ID: Michelle Mack MRN: [id number] DOB/AGE: [birth year] 58 y.o.  Referring Physician: Manam Primary Physician: Randol Dawes, MD Primary Cardiologist: New Reason for Consultation: Chest pain    HPI:  58 y.o. referred by Dr Forrestine for chest pain. History of anemia with dysfunctional uterine bleeding, polychondritis, tracheobronchomalacia with recurring URI infections, GERD, HTN, and HLD. Increasing pain since July. Worse at night. Can be reproducible to touch. No help with tylenol /Motrin . Has used Enbrel, methotrexate , plaquenil and prednisone  for pain including in hands Diagnosed with seronegative RA  Sees Dr Ishmael in rheumatology. Any tapering of steroids leads to more chest pains. Seen by Dr Lynnae in 2020 with normal heart cath.   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 Rivard   GERD (gastroesophageal reflux disease)    Hypertension    Infertility, female    OA (osteoarthritis) of knee 04/02/2020   Mod osteoarthritis, medial compartment of L knee, per x-rays at [facility].  Cortisone injection by Dr. Orpha 03/05/20   Polychondritis    Recurrent upper respiratory infection (URI)    in October - tx with abx   Type II or unspecified type diabetes mellitus without mention of complication, not stated as uncontrolled    diagnosed by Dr. Leigh    Family History  Problem Relation Age of Onset   Hypertension Mother    Diabetes Mother    Hypertension Father    Diabetes Father     Cancer Father        prostate cancer--metastasized 2021   Hypertension Sister    Thyroid  nodules Sister    Diabetes Maternal Grandmother    Breast cancer Maternal Grandmother    Heart disease Maternal Grandmother    Heart disease Paternal Grandmother     Social History   Socioeconomic History   Marital status: Married    Spouse name: Not on file   Number of children: Not on file   Years of education: Not on file   Highest education level: Not on file  Occupational History   Occupation: Clinical biochemist at [employer]    Employer: [employer]  Tobacco Use   Smoking status: Never    Passive exposure: Never   Smokeless tobacco: Never  Vaping Use   Vaping status: Never Used  Substance and Sexual Activity   Alcohol use: Yes    Comment: 0-1 drink/week, special days   Drug use: No   Sexual activity: Not Currently    Birth control/protection: None  Other Topics Concern   Not on file  Social History Narrative   Lives with husband. No pets. +passive tobacco exposure (husband smokes outside)   Daughter lives in [place], 1 granddaughter (stays with her for the summers)      Work--in daycare      Updated 11/2022   Social Drivers of Corporate investment banker Strain:  Low Risk  (01/12/2024)   Overall Financial Resource Strain (CARDIA)    Difficulty of Paying Living Expenses: Not hard at all  Food Insecurity: No Food Insecurity (01/12/2024)   Hunger Vital Sign    Worried About Running Out of Food in the Last Year: Never true    Ran Out of Food in the Last Year: Never true  Transportation Needs: No Transportation Needs (01/12/2024)   PRAPARE - Administrator, Civil Service (Medical): No    Lack of Transportation (Non-Medical): No  Physical Activity: Sufficiently Active (01/12/2024)   Exercise Vital Sign    Days of Exercise per Week: 5 days    Minutes of Exercise per Session: 60 min  Stress: No Stress Concern Present (01/12/2024)   [employer] of Occupational Health -  Occupational Stress Questionnaire    Feeling of Stress: Not at all  Social Connections: Moderately Integrated (01/12/2024)   Social Connection and Isolation Panel    Frequency of Communication with Friends and Family: More than three times a week    Frequency of Social Gatherings with Friends and Family: More than three times a week    Attends Religious Services: More than 4 times per year    Active Member of [employer] or Organizations: No    Attends Banker Meetings: Never    Marital Status: Married  Catering manager Violence: Not At Risk (01/12/2024)   Humiliation, Afraid, Rape, and Kick questionnaire    Fear of Current or Ex-Partner: No    Emotionally Abused: No    Physically Abused: No    Sexually Abused: No    Past Surgical History:  Procedure Laterality Date   BRONCHOSCOPY  01/02/2014   at [facility] expiratory collapse of trachea and focal narrowing in L mainstem bronchus and LUL   CATARACT EXTRACTION Right 09/2023   Dr. Octavia   ENDOMETRIAL VAPORIZATION W/ VERSAPOINT     LEFT HEART CATH AND CORONARY ANGIOGRAPHY N/A 07/19/2018   Procedure: LEFT HEART CATH AND CORONARY ANGIOGRAPHY;  Surgeon: Elmira Newman PARAS, MD;  Location: [facility] INVASIVE CV LAB;  Service: Cardiovascular;  Laterality: N/A;   MYOMECTOMY  07/05/2000   SUPRACERVICAL ABDOMINAL HYSTERECTOMY     still has ovaries      Current Outpatient Medications:    amLODipine  (NORVASC ) 5 MG tablet, Take 1 tablet (5 mg total) by mouth daily., Disp: 90 tablet, Rfl: 1   atorvastatin  (LIPITOR) 20 MG tablet, Take 1 tablet (20 mg total) by mouth daily., Disp: 90 tablet, Rfl: 2   Chlorphen-Phenyleph-ASA (ALKA-SELTZER PLUS COLD) 2-7.8-325 MG TBEF, Take 2 each by mouth as needed., Disp: , Rfl:    Cholecalciferol (VITAMIN D3) 50 MCG (2000 UT) capsule, Take 1 capsule (2,000 Units total) by mouth daily., Disp: 90 capsule, Rfl: 3   ENBREL SURECLICK 50 MG/ML injection, Inject 50 mg into the skin once a week., Disp: , Rfl:     folic acid  (FOLVITE ) 1 MG tablet, TAKE 1 TABLET BY MOUTH EVERY DAY, Disp: 90 tablet, Rfl: 1   methotrexate  (RHEUMATREX) 5 MG tablet, Take 5 tablets (25 mg total) by mouth once a week. Caution:Chemotherapy. Protect from light., Disp: 20 tablet, Rfl: 5   olmesartan -hydrochlorothiazide  (BENICAR  HCT) 20-12.5 MG tablet, TAKE 1 TABLET BY MOUTH EVERY DAY, Disp: 90 tablet, Rfl: 0   OZEMPIC, 1 MG/DOSE, 4 MG/3ML SOPN, Inject 1 mg into the skin once a week., Disp: , Rfl:    predniSONE  (DELTASONE ) 1 MG tablet, Take 2 mg by mouth daily with breakfast., Disp: ,  Rfl:    predniSONE  (DELTASONE ) 5 MG tablet, TAKE 2 TABLETS BY MOUTH DAILY WITH BREAKFAST., Disp: 60 tablet, Rfl: 5    Physical Exam: Blood pressure 110/68, pulse 76, height 5' 4 (1.626 m), weight 166 lb (75.3 kg), last menstrual period 10/09/2015, SpO2 99%.    Affect appropriate Healthy:  appears stated age HEENT: normal Neck supple with no adenopathy JVP normal no bruits no thyromegaly Lungs clear with no wheezing and good diaphragmatic motion Heart:  S1/S2 no murmur, no rub, gallop or click PMI normal Abdomen: benighn, BS positve, no tenderness, no AAA no bruit.  No HSM or HJR Distal pulses intact with no bruits No edema Neuro non-focal Skin warm and dry No muscular weakness   Labs:   Lab Results  Component Value Date   WBC 8.0 10/06/2022   HGB 12.1 10/06/2022   HCT 35.9 10/06/2022   MCV 88 10/06/2022   PLT 358 10/06/2022   No results for input(s): NA, K, CL, CO2, BUN, CREATININE, CALCIUM , PROT, BILITOT, ALKPHOS, ALT, AST, GLUCOSE in the last 168 hours.  Invalid input(s): LABALBU No results found for: CKTOTAL, CKMB, CKMBINDEX, TROPONINI  Lab Results  Component Value Date   CHOL 135 07/27/2018   CHOL 107 10/28/2017   CHOL 117 08/23/2016   Lab Results  Component Value Date   HDL 54 07/27/2018   HDL 49 10/28/2017   HDL 49 (L) 08/23/2016   Lab Results  Component Value Date   LDLCALC  58 11/08/2022   LDLCALC 59 07/02/2020   LDLCALC 48 10/17/2019   Lab Results  Component Value Date   TRIG 108 07/27/2018   TRIG 120 10/28/2017   TRIG 122 08/23/2016   Lab Results  Component Value Date   CHOLHDL 2.2 10/28/2017   CHOLHDL 2.4 08/23/2016   CHOLHDL 3.7 11/26/2015   No results found for: LDLDIRECT    Radiology: No results found.  EKG: SR rate 76 nonspecific ST changes    ASSESSMENT AND PLAN:   Chest Pain: atypical likely related to polychondritis and RA. Evidence by worsening when steroids tapered. Normal cath 2020 with no anomaly. Check ESR/CRP. ECG with no chronic signs of pericardial dx. Check echo for this and EF. Shared decision making favor cardiac CTA to further evaluated coronary arteries, pericardium and remainder of chest  HTN:  continue norvasc  and low sodium DASH diet HLD:  continue statin LDL at goal Polychondritis/RA:  f/u rheumatology on chronic low dose steroids, methotrexate  and enbrel  BMET, ESR, CRP  Echo Cardiac CTA Lopressor 100mg   2 hours before scan   F/U PRN pending studies    Signed: Maude Emmer 04/04/2024, 2:22 PM

## 2024-04-03 NOTE — Progress Notes (Signed)
 Michelle Mack    [id number]    [birth year]  Primary Care Physician:Knapp, Annabelle, MD  Referring Physician: Randol Annabelle, MD [address] [place],  [place] [address]  Chief complaint: Acute visit for chest pain  HPI: 58 y.o. who  has a past medical history of Anemia, Back pain, Bronchitis, Cough, DUB (dysfunctional uterine bleeding), Fibroid uterus, GERD (gastroesophageal reflux disease), Hypertension, Infertility, female, OA (osteoarthritis) of knee (04/02/2020), Polychondritis, Recurrent upper respiratory infection (URI), and Type II or unspecified type diabetes mellitus without mention of complication, not stated as uncontrolled.   Discussed the use of AI scribe software for clinical note transcription with the patient, who gave verbal consent to proceed.  History of Present Illness Michelle Mack is a 58 year old female with relapsing polychondritis, tracheobronchomalacia who presents with increased chest pain.  Chest pain - Increased chest pain, persistent since at least July - Pain worsens at night - Pain is reproducible upon palpation of the chest area - No relief with over-the-counter medications including Tylenol  and Motrin   History of relapsing polychondritis and seronegative rheumatoid arthritis - Relapsing polychondritis affects hands and chest - Current treatment includes Enbrel, methotrexate , Plaquenil and prednisone  (increased to 10 mg without improvement)  Respiratory symptoms - No breathing problems, cough, or congestion - No history of asthma - Does not use an inhaler  Pulmonary evaluation - Lung tests, including CT scan from last year, are normal - Evaluation for sleep apnea was normal   Outpatient Encounter Medications as of 04/03/2024  Medication Sig   amLODipine  (NORVASC ) 5 MG tablet Take 1 tablet (5 mg total) by mouth daily.   atorvastatin  (LIPITOR) 20 MG tablet Take 1 tablet (20 mg total) by mouth daily.   Chlorphen-Phenyleph-ASA  (ALKA-SELTZER PLUS COLD) 2-7.8-325 MG TBEF Take 2 each by mouth as needed. (Patient not taking: Reported on 02/15/2024)   Cholecalciferol (VITAMIN D3) 50 MCG (2000 UT) capsule Take 1 capsule (2,000 Units total) by mouth daily.   ENBREL SURECLICK 50 MG/ML injection Inject 50 mg into the skin once a week.   folic acid  (FOLVITE ) 1 MG tablet TAKE 1 TABLET BY MOUTH EVERY DAY   methotrexate  (RHEUMATREX) 5 MG tablet Take 5 tablets (25 mg total) by mouth once a week. Caution:Chemotherapy. Protect from light.   olmesartan -hydrochlorothiazide  (BENICAR  HCT) 20-12.5 MG tablet TAKE 1 TABLET BY MOUTH EVERY DAY   OZEMPIC, 1 MG/DOSE, 4 MG/3ML SOPN Inject 1 mg into the skin once a week.   predniSONE  (DELTASONE ) 1 MG tablet Take 2 mg by mouth daily with breakfast.   predniSONE  (DELTASONE ) 5 MG tablet TAKE 2 TABLETS BY MOUTH DAILY WITH BREAKFAST.   No facility-administered encounter medications on file as of 04/03/2024.     Physical Exam: Today's Vitals   04/03/24 1148  TempSrc: Oral  Weight: 164 lb (74.4 kg)  Height: 5' 5 (1.651 m)   Body mass index is 27.29 kg/m.  Physical Exam GEN: No acute distress CV: Regular rate and rhythm no murmurs LUNGS: Clear to auscultation bilaterally normal respiratory effort chest wall tenderness on palpation SKIN JOINTS: Warm and dry no rash   Data Reviewed: Imaging: CT high-resolution 08/20/2022-no evidence of interstitial lung disease.  Clear lungs I reviewed the images personally.  PFTs: 12/04/2021 FVC 2.61 [92%], FEV1 1.46 [64%], F/F56, TLC 4.69 [92%], DLCO 17.86 [85%] Moderate obstruction  Labs:  Assessment & Plan Relapsing polychondritis Chronic relapsing polychondritis with increased chest pain over the past month. Pain is not alleviated  by current prednisone  dosage of 10 mg. Pain is reproducible upon palpation, suggesting costochondritis. Current treatment includes methotrexate , Enbrel, and prednisone , managed by rheumatology. She does have history of  tracheobronchomalacia but her symptoms today are not suggestive of this as she does not have any respiratory symptoms such as shortness of breath, wheezing, stridor, congestion.  Lungs are clear on examination - Follow-up with rheumatology for further assessment.  She is also planning to get an appointment with cardiology for evaluation of atypical chest pain - Consider increasing prednisone  dosage or alternative immunosuppressive therapy under rheumatology guidance.  Seronegative rheumatoid arthritis Seronegative rheumatoid arthritis managed by rheumatology with methotrexate  and Enbrel. - Continue current management with rheumatology.    Lonna Coder MD [facility] 04/03/2024, 11:50 AM  CC: Randol Dawes, MD

## 2024-04-04 ENCOUNTER — Ambulatory Visit: Attending: Cardiovascular Disease | Admitting: Cardiovascular Disease

## 2024-04-04 VITALS — BP 110/68 | HR 76 | Ht 64.0 in | Wt 166.0 lb

## 2024-04-04 DIAGNOSIS — I10 Essential (primary) hypertension: Secondary | ICD-10-CM | POA: Diagnosis not present

## 2024-04-04 DIAGNOSIS — R079 Chest pain, unspecified: Secondary | ICD-10-CM | POA: Diagnosis not present

## 2024-04-04 DIAGNOSIS — I200 Unstable angina: Secondary | ICD-10-CM

## 2024-04-04 MED ORDER — METOPROLOL TARTRATE 100 MG PO TABS: 100.0000 mg | ORAL_TABLET | Freq: Once | ORAL | 0 refills | Status: DC

## 2024-04-04 NOTE — Patient Instructions (Signed)
 Medication Instructions:  Your physician recommends that you continue on your current medications as directed. Please refer to the Current Medication list given to you today.  *If you need a refill on your cardiac medications before your next appointment, please call your pharmacy*  Lab Work: TODAY: BMET If you have labs (blood work) drawn today and your tests are completely normal, you will receive your results only by: MyChart Message (if you have MyChart) OR A paper copy in the mail If you have any lab test that is abnormal or we need to change your treatment, we will call you to review the results.  Testing/Procedures: CARDIAC CT Your physician has requested that you have cardiac CT. Cardiac computed tomography (CT) is a painless test that uses an x-ray machine to take clear, detailed pictures of your heart. For further information please visit [email or web address] Please follow instruction sheet as given.  ECHO Your physician has requested that you have an echocardiogram. Echocardiography is a painless test that uses sound waves to create images of your heart. It provides your doctor with information about the size and shape of your heart and how well your heart's chambers and valves are working. This procedure takes approximately one hour. There are no restrictions for this procedure. Please do NOT wear cologne, perfume, aftershave, or lotions (deodorant is allowed). Please arrive 15 minutes prior to your appointment time.  Please note: We ask at that you not bring children with you during ultrasound (echo/ vascular) testing. Due to room size and safety concerns, children are not allowed in the ultrasound rooms during exams. Our front office staff cannot provide observation of children in our lobby area while testing is being conducted. An adult accompanying a patient to their appointment will only be allowed in the ultrasound room at the discretion of the ultrasound technician under  special circumstances. We apologize for any inconvenience.  Follow-Up: At [facility], you and your health needs are our priority.  As part of our continuing mission to provide you with exceptional heart care, our providers are all part of one team.  This team includes your primary Cardiologist (physician) and Advanced Practice Providers or APPs (Physician Assistants and Nurse Practitioners) who all work together to provide you with the care you need, when you need it.  Your next appointment:   As needed based on testing  Provider:   Maude Emmer, MD  We recommend signing up for the patient portal called MyChart.  Sign up information is provided on this After Visit Summary.  MyChart is used to connect with patients for Virtual Visits (Telemedicine).  Patients are able to view lab/test results, encounter notes, upcoming appointments, etc.  Non-urgent messages can be sent to your provider as well.    To learn more about what you can do with MyChart, go to [email or web address].   Other Instructions   Your cardiac CT will be scheduled at:   [facility] [address]  [place], [place] [address] [phone]  Please enter the parking lot using the [address] entrance and use the FREE valet service at the patient drop-off area. Enter the building and check-in with registration on the main floor.  Please follow these instructions carefully (unless otherwise directed):  An IV will be required for this test and Nitroglycerin will be given.   On the Night Before the Test: Be sure to Drink plenty of water. Do not consume any caffeinated/decaffeinated beverages or chocolate 12 hours prior to your  test. Do not take any antihistamines 12 hours prior to your test.  On the Day of the Test: Drink plenty of water until 1 hour prior to the test. Do not eat any food 1 hour prior to test. You may take your regular medications prior to the test.   Take metoprolol (Lopressor) 100 mg two hours prior to test. Patients who wear a continuous glucose monitor MUST remove the device prior to scanning. FEMALES- please wear underwire-free bra if available, avoid dresses & tight clothing  After the Test: Drink plenty of water. After receiving IV contrast, you may experience a mild flushed feeling. This is normal. On occasion, you may experience a mild rash up to 24 hours after the test. This is not dangerous. If this occurs, you can take Benadryl 25 mg, Zyrtec, Claritin, or Allegra and increase your fluid intake. (Patients taking Tikosyn should avoid Benadryl, and may take Zyrtec, Claritin, or Allegra) If you experience trouble breathing, this can be serious. If it is severe call 911 IMMEDIATELY. If it is mild, please call our office.  We will call to schedule your test 2-4 weeks out understanding that some insurance companies will need an authorization prior to the service being performed.   For more information and frequently asked questions, please visit our website : [email or web address]  For non-scheduling related questions, please contact the cardiac imaging nurse navigator should you have any questions/concerns: Cardiac Imaging Nurse Navigators Direct Office Dial: [phone]   For scheduling needs, including cancellations and rescheduling, please call [place], [phone].

## 2024-04-05 LAB — BASIC METABOLIC PANEL WITH GFR
BUN/Creatinine Ratio: 17 (ref 9–23)
BUN: 19 mg/dL (ref 6–24)
CO2: 25 mmol/L (ref 20–29)
Calcium: 10.1 mg/dL (ref 8.7–10.2)
Chloride: 99 mmol/L (ref 96–106)
Creatinine, Ser: 1.13 mg/dL — ABNORMAL HIGH (ref 0.57–1.00)
Glucose: 136 mg/dL — ABNORMAL HIGH (ref 70–99)
Potassium: 4.1 mmol/L (ref 3.5–5.2)
Sodium: 141 mmol/L (ref 134–144)
eGFR: 57 mL/min/1.73 — ABNORMAL LOW (ref >59)

## 2024-04-09 ENCOUNTER — Telehealth: Payer: Self-pay | Admitting: Cardiovascular Disease

## 2024-04-09 NOTE — Telephone Encounter (Signed)
 Pt is requesting a callback regarding medication for CT. Please advise

## 2024-04-09 NOTE — Telephone Encounter (Signed)
 Called pt wants to know which test should she take metoprolol for.  Advised pt to take metoprolol 1.5-2 hours prior to Cardiac CT scheduled for 04/12/24 at 10:30 am.  Pt expresses understanding.  No further concerns at this time.

## 2024-04-10 ENCOUNTER — Encounter (HOSPITAL_COMMUNITY): Payer: Self-pay

## 2024-04-11 ENCOUNTER — Telehealth: Payer: Self-pay | Admitting: *Deleted

## 2024-04-11 NOTE — Telephone Encounter (Signed)
 Copied from CRM #[id number]. Topic: General - Other >> Apr 11, 2024  8:14 AM Fonda T wrote: Reason for CRM: Received call from patient, requesting to speak directly to [place] at office.   Patient would like to know the status of surgery clearance for dental procedure.   Patient is requesting a return call to discuss status further, at [phone].  Patient is aware of same day call back.  Called patient back and got her VM. Not sure we have received a form for any dental clearance.

## 2024-04-12 ENCOUNTER — Ambulatory Visit (HOSPITAL_COMMUNITY)
Admission: RE | Admit: 2024-04-12 | Discharge: 2024-04-12 | Disposition: A | Discharge status: Home / Self Care | Source: Ambulatory Visit | Attending: Cardiovascular Disease | Admitting: Cardiovascular Disease

## 2024-04-12 DIAGNOSIS — R079 Chest pain, unspecified: Secondary | ICD-10-CM | POA: Diagnosis not present

## 2024-04-12 MED ORDER — NITROGLYCERIN 0.4 MG SL SUBL
0.8000 mg | SUBLINGUAL_TABLET | Freq: Once | SUBLINGUAL | Status: AC
  Administered 2024-04-12: 0.8 mg via SUBLINGUAL

## 2024-04-12 MED ORDER — IOHEXOL 350 MG/ML SOLN
100.0000 mL | Freq: Once | INTRAVENOUS | Status: AC | PRN
  Administered 2024-04-12: 100 mL via INTRAVENOUS

## 2024-04-13 ENCOUNTER — Encounter: Payer: Self-pay | Admitting: Cardiovascular Disease

## 2024-05-14 ENCOUNTER — Ambulatory Visit (HOSPITAL_COMMUNITY): Attending: Cardiovascular Disease

## 2024-05-16 ENCOUNTER — Ambulatory Visit (INDEPENDENT_AMBULATORY_CARE_PROVIDER_SITE_OTHER)

## 2024-05-16 ENCOUNTER — Encounter: Payer: Self-pay | Admitting: Podiatry

## 2024-05-16 ENCOUNTER — Ambulatory Visit: Admitting: Podiatry

## 2024-05-16 DIAGNOSIS — M21612 Bunion of left foot: Secondary | ICD-10-CM | POA: Diagnosis not present

## 2024-05-16 DIAGNOSIS — M21611 Bunion of right foot: Secondary | ICD-10-CM

## 2024-05-16 DIAGNOSIS — M2041 Other hammer toe(s) (acquired), right foot: Secondary | ICD-10-CM

## 2024-05-16 DIAGNOSIS — M2042 Other hammer toe(s) (acquired), left foot: Secondary | ICD-10-CM

## 2024-05-16 DIAGNOSIS — L600 Ingrowing nail: Secondary | ICD-10-CM

## 2024-05-16 NOTE — Patient Instructions (Signed)

## 2024-05-16 NOTE — Progress Notes (Signed)
 Subjective:   Patient ID: Michelle Mack, female   DOB: 58 y.o.   MRN: [id number]   HPI Patient presents severe bunion deformity which is gradually becoming worse over time and very difficult to wear shoe gear with.  States that her mother had this history and did have surgery and she is interested in surgical intervention.  Patient's had A1c of 6.0 and is doing well with that does take 10 mg of prednisone  daily.  Patient does not smoke likes to be active.  Also states that her ingrown toenail left has become very sore and it hard to wear shoe gear   ROS      Objective:  Physical Exam  Neurovascular status was found to be intact muscle strength adequate range of motion adequate with extremely large structural bunion deformity left over right deviation of the hallux against the second toe with elevated second toe left over right with discoloration discomfort second digit left over right foot.  Also has incurvated medial border left hallux that been very tender.  Patient was noted to have good digital perfusion well-oriented x 3     Assessment:  Severe structural HAV deformity left over right hammertoe deformity and ingrown toenail deformity left hallux     Plan:  H&P reviewed and I have recommended surgical intervention in this case.  Patient wants to have it done at I did have physician in group who specializes in does more of this type of work evaluate patient and he will see her back next week to discuss best procedure for her with the possibility also of digital fusion.  At this point she wants ingrown toenail fixed and I discussed ingrown toenail and treatment options and she is willing to get it fixed today and read and signed consent form.  I infiltrated the left big toe 60 mg like Marcaine mixture sterile prep done using sterile instrumentation remove the medial border exposed matrix applied phenol 3 applications 30 seconds followed by alcohol lavage sterile dressing gave instructions  on soaks wear dressing 24 hours take it off earlier if throbbing were to occur and encouraged her to call with questions concerns during the recovery.  X-rays dated today indicate severe bunion deformity left over right with moderate elevation second digit left over right possible mild arthritis of the joint surface

## 2024-05-22 DIAGNOSIS — E1165 Type 2 diabetes mellitus with hyperglycemia: Secondary | ICD-10-CM | POA: Diagnosis not present

## 2024-05-28 DIAGNOSIS — M1991 Primary osteoarthritis, unspecified site: Secondary | ICD-10-CM | POA: Diagnosis not present

## 2024-05-28 DIAGNOSIS — M0609 Rheumatoid arthritis without rheumatoid factor, multiple sites: Secondary | ICD-10-CM | POA: Diagnosis not present

## 2024-05-28 DIAGNOSIS — R5383 Other fatigue: Secondary | ICD-10-CM | POA: Diagnosis not present

## 2024-05-28 DIAGNOSIS — Z7952 Long term (current) use of systemic steroids: Secondary | ICD-10-CM | POA: Diagnosis not present

## 2024-05-29 ENCOUNTER — Ambulatory Visit

## 2024-05-29 DIAGNOSIS — M2042 Other hammer toe(s) (acquired), left foot: Secondary | ICD-10-CM | POA: Diagnosis not present

## 2024-05-29 DIAGNOSIS — M21611 Bunion of right foot: Secondary | ICD-10-CM

## 2024-05-29 DIAGNOSIS — M21612 Bunion of left foot: Secondary | ICD-10-CM

## 2024-05-29 DIAGNOSIS — M205X2 Other deformities of toe(s) (acquired), left foot: Secondary | ICD-10-CM

## 2024-05-30 NOTE — Progress Notes (Signed)
 Subjective:  Patient ID: Michelle Mack, female    DOB: Jun 10, 1966,  MRN: [id number]  Chief Complaint  Patient presents with   Foot Pain    Rm12 Patient wants to discuss surgery left foot.    Discussed the use of AI scribe software for clinical note transcription with the patient, who gave verbal consent to proceed.  History of Present Illness VAUGHN FRIEZE is a 58 year old female with polychondritis who presents with postoperative follow-up for a toenail avulsion and bunion pain.  She reports delayed healing at the toenail avulsion site with pain and clear, non-thick drainage. She has not used topical medications on the toe.  She has polychondritis involving her airway and chest and takes daily oral prednisone , recently increased during a flare and now tapering at 9 mg.  She has left greater than right bunion pain with associated hammer toe. The deformity rubs against footwear and causes significant discomfort. She is interested in getting this surgically addressed.     Review of Systems: Negative except as noted in the HPI. Denies N/V/F/Ch.  Past Medical History:  Diagnosis Date   Anemia    on iron    Back pain    tx with ibuprofen  800mg    Bronchitis    treated recently by abx   Cough    non-productive   DUB (dysfunctional uterine bleeding)    Fibroid uterus    GYN--Dr. Rivard   GERD (gastroesophageal reflux disease)    Hypertension    Infertility, female    OA (osteoarthritis) of knee 04/02/2020   Mod osteoarthritis, medial compartment of L knee, per x-rays at [facility]  Cortisone injection by Dr. Orpha 03/05/20   Polychondritis    Recurrent upper respiratory infection (URI)    in October - tx with abx   Type II or unspecified type diabetes mellitus without mention of complication, not stated as uncontrolled    diagnosed by Dr. Leigh    Current Outpatient Medications:    amLODipine  (NORVASC ) 5 MG tablet, Take 1 tablet (5 mg total) by mouth daily., Disp:  90 tablet, Rfl: 1   atorvastatin  (LIPITOR) 20 MG tablet, Take 1 tablet (20 mg total) by mouth daily., Disp: 90 tablet, Rfl: 2   Chlorphen-Phenyleph-ASA (ALKA-SELTZER PLUS COLD) 2-7.8-325 MG TBEF, Take 2 each by mouth as needed., Disp: , Rfl:    Cholecalciferol (VITAMIN D3) 50 MCG (2000 UT) capsule, Take 1 capsule (2,000 Units total) by mouth daily., Disp: 90 capsule, Rfl: 3   ENBREL SURECLICK 50 MG/ML injection, Inject 50 mg into the skin once a week., Disp: , Rfl:    folic acid  (FOLVITE ) 1 MG tablet, TAKE 1 TABLET BY MOUTH EVERY DAY, Disp: 90 tablet, Rfl: 1   methotrexate  (RHEUMATREX) 5 MG tablet, Take 5 tablets (25 mg total) by mouth once a week. Caution:Chemotherapy. Protect from light., Disp: 20 tablet, Rfl: 5   metoprolol  tartrate (LOPRESSOR ) 100 MG tablet, Take 1 tablet (100 mg total) by mouth once for 1 dose. Take 90-120 minutes prior to scan. Hold for SBP less than 110., Disp: 1 tablet, Rfl: 0   olmesartan -hydrochlorothiazide  (BENICAR  HCT) 20-12.5 MG tablet, TAKE 1 TABLET BY MOUTH EVERY DAY, Disp: 90 tablet, Rfl: 0   OZEMPIC, 1 MG/DOSE, 4 MG/3ML SOPN, Inject 1 mg into the skin once a week., Disp: , Rfl:    predniSONE  (DELTASONE ) 1 MG tablet, Take 2 mg by mouth daily with breakfast., Disp: , Rfl:    predniSONE  (DELTASONE ) 5 MG tablet, TAKE 2 TABLETS  BY MOUTH DAILY WITH BREAKFAST., Disp: 60 tablet, Rfl: 5  Social History   Tobacco Use  Smoking Status Never   Passive exposure: Never  Smokeless Tobacco Never     Objective:   Constitutional Well developed. Well nourished. Oriented to person, place, and time.  Vascular Dorsalis pedis pulses palpable bilaterally. Posterior tibial pulses palpable bilaterally. Capillary refill normal to all digits.  No cyanosis or clubbing noted. Pedal hair growth normal.  Neurologic Normal speech. Epicritic sensation to light touch grossly present bilaterally. Negative tinel sign at tarsal tunnel bilaterally.   Dermatologic Skin texture and turgor  are within normal limits.  No open wounds. Healing partial nail avulsion site at left hallux without complication. No signs of infection.   Musculoskeletal: Normal joint ROM without pain or crepitus bilaterally. Rectus hindfoot.  Hallux abductovalgus deformity present Left 1st MPJ full range with pain. Left 1st TMT without gross hypermobility. Right 1st MPJ full range with pain  Right 1st TMT without gross hypermobility. Lesser digital contractures present left 2nd digit with flexible deformity.   Radiographs: Previously taken and reviewed. Left Hallux abductovalgus deformity present. Metatarsal parabola normal. 1st/2nd IMA: 16.8; TSP: 6. Joint space narrowing at 1st MTP. Rectus foot shape. No other acute osseous abnormalities.    Assessment:   1. Bilateral bunions   2. Acquired hammertoe of left foot   3. Hallux limitus, left      Plan:  Patient was evaluated and treated and all questions answered.  Assessment and Plan Assessment & Plan Left foot hallux valgus deformity with first metatarsophalangeal joint pain  Recommended metatarsophalangeal joint fusion for pain relief and deformity correction. - Discussed with the patient surgical treatment options.  Due to her joint pain, I would recommend a left first metatarsophalangeal joint arthrodesis and second hammertoe correction.   - Due to her chronic steroid usage, she would have an elevated risk of postoperative complication.  She is tapering this dose.  She expresses understanding of this. - Postoperatively, she would be in a cam walker weightbearing as tolerated immediate postop.  This would be an outpatient procedure.  She does express understanding of this. - Our surgery scheduler will call her to find a mutually beneficial date if she wishes to proceed  Left second toe hammer toe deformity Flexible hammer toe causing discomfort. - Will perform procedure to straighten the left second toe.  Informed surgical risk consent  was reviewed and read aloud to the patient.  I reviewed the imaging.  I have discussed my findings with the patient in great detail.  I have discussed all risks including but not limited to infection, stiffness, scarring, limp, disability, deformity, damage to blood vessels and nerves, numbness, poor healing, need for braces, arthritis, chronic pain, amputation, and death.  All benefits and realistic expectations discussed in great detail.  I have made no promises as to the outcome.  I have provided realistic expectations.  I have offered the patient a 2nd opinion, which they have declined and assured me they preferred to proceed despite the risks.  Planned procedures: Left first metatarsophalangeal joint arthrodesis, left second hammertoe repair via flexor tendon transfer  Post-operative weight bearing status: WBAT in CAM walker  VTE risk assessment/need for prophylaxis: No DVT prophylaxis indicated   Post-operative pain management: Oxycodone/Acetaminophen  5mg /325mg  q6h PRN  Patient risk factors that were discussed: Chronic steroid usage for polychondritis  RTC 1 week post operatively or for repeat evaluation  Prentice Ovens, DPM AACFAS Fellowship Trained Podiatric Surgeon [facility]

## 2024-06-11 ENCOUNTER — Telehealth: Payer: Self-pay

## 2024-06-11 NOTE — Telephone Encounter (Signed)
 Called and scheduled patient for surgery for 12/29 per her request. Patient is on Ozempic and has been give directions on how to d/c prior to surgery. Patient is not on any blood thinners. Patient pharmacy correct in chart.

## 2024-06-19 ENCOUNTER — Telehealth: Payer: Self-pay

## 2024-06-19 NOTE — Telephone Encounter (Signed)
 DOS- 07/02/2024  ARTHRODESIS, GREAT TOE; METATARSOPHALANGEAL JOINT LT- 71249 2ND HAMMERTOE REPAIR LT- 71714  PER INGRID WITH [employer], PRIOR AUTH IS NOT REQUIRED FOR CPT CODES 71249 AND [phone]. REF# [id number]

## 2024-06-22 ENCOUNTER — Telehealth: Payer: Self-pay

## 2024-06-22 NOTE — Telephone Encounter (Signed)
 Returned patient voicemail in regards to rescheduling surgery. Patient is set for end of January as of right now and will decide if she wants to keep it as scheduled once she finds out her portion.

## 2024-07-11 ENCOUNTER — Encounter (HOSPITAL_COMMUNITY): Payer: Self-pay

## 2024-07-11 ENCOUNTER — Ambulatory Visit: Payer: Self-pay

## 2024-07-11 ENCOUNTER — Emergency Department (HOSPITAL_COMMUNITY)

## 2024-07-11 ENCOUNTER — Other Ambulatory Visit: Payer: Self-pay

## 2024-07-11 ENCOUNTER — Emergency Department (HOSPITAL_COMMUNITY): Admission: EM | Admit: 2024-07-11 | Discharge: 2024-07-12 | Disposition: A | Discharge status: Home / Self Care

## 2024-07-11 DIAGNOSIS — R110 Nausea: Secondary | ICD-10-CM | POA: Diagnosis not present

## 2024-07-11 DIAGNOSIS — Z794 Long term (current) use of insulin: Secondary | ICD-10-CM | POA: Insufficient documentation

## 2024-07-11 DIAGNOSIS — Z955 Presence of coronary angioplasty implant and graft: Secondary | ICD-10-CM | POA: Diagnosis not present

## 2024-07-11 DIAGNOSIS — E119 Type 2 diabetes mellitus without complications: Secondary | ICD-10-CM | POA: Insufficient documentation

## 2024-07-11 DIAGNOSIS — M25452 Effusion, left hip: Secondary | ICD-10-CM | POA: Insufficient documentation

## 2024-07-11 DIAGNOSIS — I10 Essential (primary) hypertension: Secondary | ICD-10-CM | POA: Insufficient documentation

## 2024-07-11 DIAGNOSIS — R1030 Lower abdominal pain, unspecified: Secondary | ICD-10-CM | POA: Insufficient documentation

## 2024-07-11 DIAGNOSIS — M25552 Pain in left hip: Secondary | ICD-10-CM | POA: Diagnosis present

## 2024-07-11 DIAGNOSIS — Z79899 Other long term (current) drug therapy: Secondary | ICD-10-CM | POA: Insufficient documentation

## 2024-07-11 LAB — CBC WITH DIFFERENTIAL/PLATELET
Abs Immature Granulocytes: 0.02 K/uL (ref 0.00–0.07)
Basophils Absolute: 0 K/uL (ref 0.0–0.1)
Basophils Relative: 0 %
Eosinophils Absolute: 0 K/uL (ref 0.0–0.5)
Eosinophils Relative: 0 %
HCT: 38.1 % (ref 36.0–46.0)
Hemoglobin: 12.2 g/dL (ref 12.0–15.0)
Immature Granulocytes: 0 %
Lymphocytes Relative: 25 %
Lymphs Abs: 2 K/uL (ref 0.7–4.0)
MCH: 28.8 pg (ref 26.0–34.0)
MCHC: 32 g/dL (ref 30.0–36.0)
MCV: 89.9 fL (ref 80.0–100.0)
Monocytes Absolute: 0.7 K/uL (ref 0.1–1.0)
Monocytes Relative: 8 %
Neutro Abs: 5.1 K/uL (ref 1.7–7.7)
Neutrophils Relative %: 67 %
Platelets: 358 K/uL (ref 150–400)
RBC: 4.24 MIL/uL (ref 3.87–5.11)
RDW: 14 % (ref 11.5–15.5)
WBC: 7.8 K/uL (ref 4.0–10.5)
nRBC: 0 % (ref 0.0–0.2)

## 2024-07-11 LAB — COMPREHENSIVE METABOLIC PANEL WITH GFR
ALT: 24 U/L (ref 0–44)
AST: 42 U/L — ABNORMAL HIGH (ref 15–41)
Albumin: 4.2 g/dL (ref 3.5–5.0)
Alkaline Phosphatase: 100 U/L (ref 38–126)
Anion gap: 10 (ref 5–15)
BUN: 7 mg/dL (ref 6–20)
CO2: 26 mmol/L (ref 22–32)
Calcium: 9.7 mg/dL (ref 8.9–10.3)
Chloride: 103 mmol/L (ref 98–111)
Creatinine, Ser: 0.86 mg/dL (ref 0.44–1.00)
GFR, Estimated: >60 mL/min
Glucose, Bld: 111 mg/dL — ABNORMAL HIGH (ref 70–99)
Potassium: 4.4 mmol/L (ref 3.5–5.1)
Sodium: 138 mmol/L (ref 135–145)
Total Bilirubin: 0.8 mg/dL (ref 0.0–1.2)
Total Protein: 7.8 g/dL (ref 6.5–8.1)

## 2024-07-11 MED ORDER — IOHEXOL 300 MG/ML  SOLN
100.0000 mL | Freq: Once | INTRAMUSCULAR | Status: AC | PRN
  Administered 2024-07-11: 100 mL via INTRAVENOUS

## 2024-07-11 MED ORDER — MORPHINE SULFATE (PF) 4 MG/ML IV SOLN
4.0000 mg | Freq: Once | INTRAVENOUS | Status: AC
  Administered 2024-07-11: 4 mg via INTRAVENOUS
  Filled 2024-07-11: qty 1

## 2024-07-11 NOTE — Telephone Encounter (Signed)
 FYI Only or Action Required?: FYI only for provider: ED advised.  Patient was last seen in primary care on 02/15/2024 by Randol Dawes, MD.  Called Nurse Triage reporting Groin Pain and Leg Pain.  Symptoms began several months ago.  Interventions attempted: Other: Triage cut short.  Symptoms are: gradually worsening.  Triage Disposition: Go to ED Now (Notify PCP)  Patient/caregiver understands and will follow disposition?: Yes    2 months ago onset of left groin pain that came out of nowhere, radiates down leg. Has gotten progressively worse and now with 9/10 pain and unable to walk. Groin and leg appears normal. Just got off a plane. Unable to walk and had to use a wc to get off the plane. On her way to the ED now. Triage cut short as she is already headed to the ED which is where she should go for current symptoms.     Copied from CRM #[id number]. Topic: Clinical - Red Word Triage >> Jul 11, 2024 11:56 AM Hadassah PARAS wrote: Red Word that prompted transfer to Nurse Triage: Pt is experiencing pain in leg/groin area, cannot walk. Pt is req an x-ray. Pt has disconnected. Please call pt on # [phone] >> Jul 11, 2024 12:59 PM Chasity T wrote: pt called back after disconnection and declined to wait on hold to please call pt back Reason for Disposition  Unable to walk  Answer Assessment - Initial Assessment Questions 1. ONSET: When did the pain start?      2 months ago. Has gotten progressively worse. 2. LOCATION: Where is the pain located?      Left groin that radiates down left leg 3. PAIN: How bad is the pain?    (Scale 1-10; or mild, moderate, severe)     9/10 4. WORK OR EXERCISE: Has there been any recent work or exercise that involved this part of the body?      Denies 5. CAUSE: What do you think is causing the leg pain?     *No Answer* 6. OTHER SYMPTOMS: Do you have any other symptoms? (e.g., chest pain, back pain, breathing difficulty, swelling, rash, fever, numbness,  weakness)     *No Answer* 7. PREGNANCY: Is there any chance you are pregnant? When was your last menstrual period?     *No Answer*  Protocols used: Leg Pain-A-AH

## 2024-07-11 NOTE — ED Provider Notes (Signed)
 " [place] EMERGENCY DEPARTMENT AT [facility] Provider Note   CSN: [id number] Arrival date & time: 07/11/24  8197     Patient presents with: Groin Pain   Michelle Mack is a 59 y.o. female who presents with atraumatic left groin pain.  Pain has been ongoing for the past few days.  Pain is worse with ambulating.  Denies any numbness or tingling.  Has had nausea without vomiting.  No urinary or vaginal symptoms.    Groin Pain   Past Medical History:  Diagnosis Date   Anemia    on iron    Back pain    tx with ibuprofen  800mg    Bronchitis    treated recently by abx   Cough    non-productive   DUB (dysfunctional uterine bleeding)    Fibroid uterus    GYN--Dr. Rivard   GERD (gastroesophageal reflux disease)    Hypertension    Infertility, female    OA (osteoarthritis) of knee 04/02/2020   Mod osteoarthritis, medial compartment of L knee, per x-rays at [facility].  Cortisone injection by Dr. Orpha 03/05/20   Polychondritis    Recurrent upper respiratory infection (URI)    in October - tx with abx   Type II or unspecified type diabetes mellitus without mention of complication, not stated as uncontrolled    diagnosed by Dr. Leigh   Past Surgical History:  Procedure Laterality Date   BRONCHOSCOPY  01/02/2014   at [facility] expiratory collapse of trachea and focal narrowing in L mainstem bronchus and LUL   CATARACT EXTRACTION Right 09/2023   Dr. Octavia   ENDOMETRIAL VAPORIZATION W/ VERSAPOINT     LEFT HEART CATH AND CORONARY ANGIOGRAPHY N/A 07/19/2018   Procedure: LEFT HEART CATH AND CORONARY ANGIOGRAPHY;  Surgeon: Elmira Newman PARAS, MD;  Location: [facility] INVASIVE CV LAB;  Service: Cardiovascular;  Laterality: N/A;   MYOMECTOMY  07/05/2000   SUPRACERVICAL ABDOMINAL HYSTERECTOMY     still has ovaries       Prior to Admission medications  Medication Sig Start Date End Date Taking? Authorizing Provider  oxyCODONE  (ROXICODONE ) 5 MG immediate release  tablet Take 1 tablet (5 mg total) by mouth every 4 (four) hours as needed for severe pain (pain score 7-10). 07/12/24  Yes Donnajean Lynwood DEL, PA-C  amLODipine  (NORVASC ) 5 MG tablet Take 1 tablet (5 mg total) by mouth daily. 01/12/24   Randol Dawes, MD  atorvastatin  (LIPITOR) 20 MG tablet Take 1 tablet (20 mg total) by mouth daily. 02/15/24   Randol Dawes, MD  Chlorphen-Phenyleph-ASA (ALKA-SELTZER PLUS COLD) 2-7.8-325 MG TBEF Take 2 each by mouth as needed.    [provider]  Cholecalciferol (VITAMIN D3) 50 MCG (2000 UT) capsule Take 1 capsule (2,000 Units total) by mouth daily. 01/12/24   Randol Dawes, MD  ENBREL SURECLICK 50 MG/ML injection Inject 50 mg into the skin once a week.    [provider]  folic acid  (FOLVITE ) 1 MG tablet TAKE 1 TABLET BY MOUTH EVERY DAY 12/12/23   Meade Verdon RAMAN, MD  methotrexate  (RHEUMATREX) 5 MG tablet Take 5 tablets (25 mg total) by mouth once a week. Caution:Chemotherapy. Protect from light. 04/15/22   Meade Verdon RAMAN, MD  metoprolol  tartrate (LOPRESSOR ) 100 MG tablet Take 1 tablet (100 mg total) by mouth once for 1 dose. Take 90-120 minutes prior to scan. Hold for SBP less than 110. 04/04/24 05/29/24  Delford Maude BROCKS, MD  olmesartan -hydrochlorothiazide  (BENICAR  HCT) 20-12.5 MG tablet TAKE 1 TABLET BY  MOUTH EVERY DAY 12/30/23   Randol Dawes, MD  OZEMPIC, 1 MG/DOSE, 4 MG/3ML SOPN Inject 1 mg into the skin once a week. 01/10/23   [provider]  predniSONE  (DELTASONE ) 1 MG tablet Take 2 mg by mouth daily with breakfast.    [provider]  predniSONE  (DELTASONE ) 5 MG tablet TAKE 2 TABLETS BY MOUTH DAILY WITH BREAKFAST. 04/18/23   Desai, Nikita S, MD    Allergies: Ampicillin, Penicillins, Sulfamethoxazole -trimethoprim , Wax [beeswax], Cephalosporins, Elemental sulfur, Mobic  [meloxicam ], and Nitrofuran derivatives    Review of Systems  Musculoskeletal:  Positive for myalgias.    Updated Vital Signs BP (!) 146/91 (BP Location: Left Arm)   Pulse  83   Temp 98.9 F (37.2 C) (Oral)   Resp 16   Ht 5' 4 (1.626 m)   Wt 79.8 kg   LMP 10/09/2015   SpO2 99%   BMI 30.21 kg/m   Physical Exam Vitals and nursing note reviewed.  Constitutional:      General: She is not in acute distress.    Appearance: She is well-developed.  HENT:     Head: Normocephalic and atraumatic.  Eyes:     Conjunctiva/sclera: Conjunctivae normal.  Cardiovascular:     Rate and Rhythm: Normal rate and regular rhythm.     Heart sounds: No murmur heard. Pulmonary:     Effort: Pulmonary effort is normal. No respiratory distress.     Breath sounds: Normal breath sounds.  Abdominal:     Palpations: Abdomen is soft.     Tenderness: There is no abdominal tenderness.  Musculoskeletal:        General: No swelling.     Cervical back: Neck supple.     Comments: Significant tenderness to left groin without any obvious abscess.  There is no induration, fluctuance, erythema or warmth.  Maintained left lower extremity in guarded position.  Tolerates full ankle and knee range of motion, compartments are soft, negative Homans, DP/PT pulses 2+.  Significant discomfort with hip range of motion  Skin:    General: Skin is warm and dry.     Capillary Refill: Capillary refill takes less than 2 seconds.  Neurological:     Mental Status: She is alert.  Psychiatric:        Mood and Affect: Mood normal.     (all labs ordered are listed, but only abnormal results are displayed) Labs Reviewed  COMPREHENSIVE METABOLIC PANEL WITH GFR - Abnormal; Notable for the following components:      Result Value   Glucose, Bld 111 (*)    AST 42 (*)    All other components within normal limits  CBC WITH DIFFERENTIAL/PLATELET  SEDIMENTATION RATE  C-REACTIVE PROTEIN    EKG: None  Radiology: DG Hip Unilat W or Wo Pelvis 2-3 Views Left Result Date: 07/11/2024 CLINICAL DATA:  Left groin pain EXAM: DG HIP (WITH OR WITHOUT PELVIS) 2-3V LEFT COMPARISON:  None Available. FINDINGS: No  fracture or malalignment. Mild right greater than left hip degenerative change. Pubic symphysis and rami appear normal IMPRESSION: Mild degenerative changes. Electronically Signed   By: Luke Bun M.D.   On: 07/11/2024 23:36   CT ABDOMEN PELVIS W CONTRAST Result Date: 07/11/2024 CLINICAL DATA:  Groin pain EXAM: CT ABDOMEN AND PELVIS WITH CONTRAST TECHNIQUE: Multidetector CT imaging of the abdomen and pelvis was performed using the standard protocol following bolus administration of intravenous contrast. RADIATION DOSE REDUCTION: This exam was performed according to the departmental dose-optimization program which includes automated exposure  control, adjustment of the mA and/or kV according to patient size and/or use of iterative reconstruction technique. CONTRAST:  OMNIPAQUE  IOHEXOL  300 MG/ML  SOLN COMPARISON:  CT 10/30/2015, CT 09/27/2015 FINDINGS: Lower chest: Lung bases demonstrate no acute airspace disease. Hepatobiliary: No focal liver abnormality is seen. No gallstones, gallbladder wall thickening, or biliary dilatation. Pancreas: Unremarkable. No pancreatic ductal dilatation or surrounding inflammatory changes. Spleen: Normal in size without focal abnormality. Adrenals/Urinary Tract: Adrenal glands are normal. No hydronephrosis. Subcentimeter hypodensity right kidney too small to further characterize, no imaging follow-up recommended. Urinary bladder is unremarkable. Stomach/Bowel: Stomach within normal limits. No dilated small bowel. No acute bowel wall thickening. Negative appendix Vascular/Lymphatic: No significant vascular findings are present. No enlarged abdominal or pelvic lymph nodes. Reproductive: Interval myomectomies changes. No suspicious adnexal mass Other: No ascites or free air. Musculoskeletal: No fracture or malalignment. Positive for left hip effusion. Some soft tissue edema of the left hip. IMPRESSION: 1. No CT evidence for acute intra-abdominal or pelvic abnormality. 2. Positive  for left hip effusion with some soft tissue edema of the left hip. Correlate for any signs of inflammation or infection to the left hip. MRI follow-up may be obtained as appropriate. Electronically Signed   By: Luke Bun M.D.   On: 07/11/2024 23:35     Procedures   Medications Ordered in the ED  ketorolac  (TORADOL ) 15 MG/ML injection 15 mg (has no administration in time range)  morphine  (PF) 4 MG/ML injection 4 mg (4 mg Intravenous Given 07/11/24 2210)  iohexol  (OMNIPAQUE ) 300 MG/ML solution 100 mL (100 mLs Intravenous Contrast Given 07/11/24 2306)    Clinical Course as of 07/12/24 0027  Wed Jul 11, 2024  2123 Patient evaluated for left leg and groin pain over the past few days.  Has significantly worsened and is unable to bear weight on the leg due to pain.  Upon arrival patient is hemodynamically stable and nontoxic-appearing.  On exam patient has significant tenderness to her left groin without any fluctuance, induration, erythema or warmth. [JT]  2222 CBC with Differential Unremarkable [JT]  2343 Comprehensive metabolic panel(!) No significant findings [JT]  2346 DG Hip Unilat W or Wo Pelvis 2-3 Views Left Mild degenerative changes [JT]  2346 CT ABDOMEN PELVIS W CONTRAST Left hip effusion with some soft tissue edema of the left hip [JT]  Thu Jul 12, 2024  0005 Ortho Consulted [JT]  0022 Discussed patient with Dr. Vernetta.  He was able to review images.  Did not see a significant effusion on scan.  Given that patient is afebrile without leukocytosis and overall well-appearing felt that close outpatient follow up would be reasonable vs admission to attempt tap if patient and family does not feel comfortable going home [JT]  0023 Discussed findings with patient and family at bedside.  Discussed risks and benefits of discharge versus admission.  Using shared decision making we have agreed upon discharge with close Ortho follow-up.  Strict return precautions provided.  Have added on CRP and  sed rate to help assist in outpatient management. [JT]    Clinical Course User Index [JT] Donnajean Lynwood DEL, PA-C                                 Medical Decision Making Amount and/or Complexity of Data Reviewed Labs: ordered. Decision-making details documented in ED Course. Radiology: ordered. Decision-making details documented in ED Course.  Risk Prescription drug management.  This patient presents to the ED with chief complaint(s) of Groin and leg pain .  The complaint involves an extensive differential diagnosis and also carries with it a high risk of complications and morbidity.   Pertinent past medical history as listed in HPI  The differential diagnosis includes  Septic joint, gout, fracture, arthritis, abscess, gangrene, necrotizing fasciitis Additional history obtained: Records reviewed Care Everywhere/External Records  Disposition:   Patient will be discharged home. The patient has been appropriately medically screened and/or stabilized in the ED. I have low suspicion for any other emergent medical condition which would require further screening, evaluation or treatment in the ED or require inpatient management. At time of discharge the patient is hemodynamically stable and in no acute distress. I have discussed work-up results and diagnosis with patient and answered all questions. Patient is agreeable with discharge plan. We discussed strict return precautions for returning to the emergency department and they verbalized understanding.     Social Determinants of Health:   none  This note was dictated with voice recognition software.  Despite best efforts at proofreading, errors may have occurred which can change the documentation meaning.       Final diagnoses:  Effusion of left hip    ED Discharge Orders          Ordered    oxyCODONE  (ROXICODONE ) 5 MG immediate release tablet  Every 4 hours PRN        07/12/24 0020               Donnajean Lynwood DEL,  PA-C 07/12/24 0027    Kammerer, Megan L, DO 07/13/24 9853  "

## 2024-07-11 NOTE — Telephone Encounter (Signed)
 First attempt to contact pt, no answer, LVM for call back to PCP office. Placed in call back.   Copied from CRM #[id number]. Topic: Clinical - Red Word Triage >> Jul 11, 2024 11:56 AM Hadassah PARAS wrote: Red Word that prompted transfer to Nurse Triage: Pt is experiencing pain in leg/groin area, cannot walk. Pt is req an x-ray. Pt has disconnected. Please call pt on # [phone] >> Jul 11, 2024 12:59 PM Chasity T wrote: pt called back after disconnection and declined to wait on hold to please call pt back

## 2024-07-11 NOTE — ED Triage Notes (Signed)
 PT. Arrives for groin pain that extends to the left thigh that is preventing her from walking. Pt. Reports nausea yesterday and a headache today.

## 2024-07-12 MED ORDER — KETOROLAC TROMETHAMINE 15 MG/ML IJ SOLN
15.0000 mg | Freq: Once | INTRAMUSCULAR | Status: AC
  Administered 2024-07-12: 15 mg via INTRAMUSCULAR
  Filled 2024-07-12: qty 1

## 2024-07-12 MED ORDER — OXYCODONE HCL 5 MG PO TABS: 5.0000 mg | ORAL_TABLET | ORAL | 0 refills | Status: AC | PRN

## 2024-07-12 NOTE — Discharge Instructions (Signed)
 You were evaluated in the emergency room for hip and groin pain. You were found to have a hip effusion. Please follow up with the orthopedic doctor within the next 1 to 3 days.  If you experience any new or worsening symptoms including fevers, chills please return to the emergency room.  A prescription for oxycodone  was sent to your pharmacy.  Please avoid driving or drinking alcohol while using this medication as it may cause drowsiness.  You may additionally alternate Tylenol  and ibuprofen  for pain.

## 2024-07-13 ENCOUNTER — Encounter

## 2024-07-26 ENCOUNTER — Telehealth: Payer: Self-pay

## 2024-07-26 ENCOUNTER — Ambulatory Visit: Admitting: Physician Assistant

## 2024-07-26 NOTE — Telephone Encounter (Signed)
 Called patient to confirm she wanted to cancel appointment scheduled for 07/27/24 as she responded no to the text reminder. Patient stated she is scheduled/rescheduled for surgery and stated she called to cancel and has not heard back from anyone. I informed patient I would forward her message to our surgical coordinator.

## 2024-07-27 ENCOUNTER — Encounter

## 2024-07-27 ENCOUNTER — Encounter: Admitting: Podiatry

## 2024-07-31 NOTE — Progress Notes (Unsigned)
 No chief complaint on file.  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 Per ENT, the sinus should resume normal function with resolution of the fistula. If the fistula does not fully close, further surgical intervention may be needed with her oral surgeon. If sinus issues persist even after the closure of the fistula, additional diagnostic measures such as a CT scan may be contemplated.   She went to the ER 07/10/24 with L groin pain. She was found to have L hip efussion with some soft tissue edema of L hip.  ER had d/w Dr. Vernetta, recommended outpatient follow-up (afebrile, WBC normal, no significant effusion). ER notes say they added on CRP and ESR but I do not see results for this.  XR of hip/pelvis: FINDINGS: No fracture or malalignment. Mild right greater than left hip degenerative change. Pubic symphysis and rami appear normal IMPRESSION: Mild degenerative changes.  CT abd/pelvis: IMPRESSION: 1. No CT evidence for acute intra-abdominal or pelvic abnormality. 2. Positive for left hip effusion with some soft tissue edema of the left hip. Correlate for any signs of inflammation or infection to the left hip. MRI follow-up may be obtained as appropriate.    Chemistry      Component Value Date/Time   NA 138 07/11/2024 2213   NA 141 04/04/2024 1451   K 4.4 07/11/2024 2213   CL 103 07/11/2024 2213   CO2 26 07/11/2024 2213   BUN 7 07/11/2024 2213   BUN 19 04/04/2024 1451   CREATININE 0.86 07/11/2024 2213   CREATININE 1.09 06/30/2017 1455      Component Value Date/Time   CALCIUM  9.7 07/11/2024 2213   ALKPHOS 100 07/11/2024 2213   AST 42 (H) 07/11/2024 2213   ALT 24 07/11/2024 2213   BILITOT 0.8 07/11/2024 2213   BILITOT 0.9 10/06/2022 1207     Lab Results  Component Value Date   WBC 7.8 07/11/2024   HGB 12.2 07/11/2024   HCT 38.1 07/11/2024   MCV 89.9 07/11/2024   PLT 358 07/11/2024        PMH, PSH, SH reviewed   ROS:    PHYSICAL EXAM:  LMP 10/09/2015       ASSESSMENT/PLAN:   No idea what she is needing--appt says labs and CXR.  For what? Bringing orders?? reviewed chart and recent visits--ENT, ER. Unsure if related to what she is wanting.   Does she have ortho f/u for her L hip pain? CRP and sed rate were supposed to have been added on in ER, but never done.

## 2024-08-01 ENCOUNTER — Ambulatory Visit: Payer: Self-pay | Admitting: Family Medicine

## 2024-08-01 ENCOUNTER — Ambulatory Visit: Admitting: Family Medicine

## 2024-08-01 ENCOUNTER — Ambulatory Visit
Admission: RE | Admit: 2024-08-01 | Discharge: 2024-08-01 | Disposition: A | Discharge status: Home / Self Care | Source: Ambulatory Visit | Attending: Family Medicine | Admitting: Family Medicine

## 2024-08-01 ENCOUNTER — Encounter: Payer: Self-pay | Admitting: Family Medicine

## 2024-08-01 VITALS — BP 90/60 | HR 80 | Temp 98.9°F | Ht 64.0 in | Wt 166.6 lb

## 2024-08-01 DIAGNOSIS — I10 Essential (primary) hypertension: Secondary | ICD-10-CM | POA: Diagnosis not present

## 2024-08-01 DIAGNOSIS — M941 Relapsing polychondritis: Secondary | ICD-10-CM

## 2024-08-01 DIAGNOSIS — R079 Chest pain, unspecified: Secondary | ICD-10-CM

## 2024-08-01 DIAGNOSIS — R5383 Other fatigue: Secondary | ICD-10-CM | POA: Diagnosis not present

## 2024-08-01 DIAGNOSIS — M25452 Effusion, left hip: Secondary | ICD-10-CM

## 2024-08-01 DIAGNOSIS — R0609 Other forms of dyspnea: Secondary | ICD-10-CM

## 2024-08-01 DIAGNOSIS — R062 Wheezing: Secondary | ICD-10-CM

## 2024-08-01 DIAGNOSIS — E559 Vitamin D deficiency, unspecified: Secondary | ICD-10-CM

## 2024-08-01 DIAGNOSIS — Z5181 Encounter for therapeutic drug level monitoring: Secondary | ICD-10-CM | POA: Diagnosis not present

## 2024-08-01 MED ORDER — AIRSUPRA 90-80 MCG/ACT IN AERO: 2.0000 | INHALATION_SPRAY | RESPIRATORY_TRACT | 1 refills | Status: AC | PRN

## 2024-08-01 MED ORDER — ALBUTEROL SULFATE (2.5 MG/3ML) 0.083% IN NEBU
2.5000 mg | INHALATION_SOLUTION | Freq: Once | RESPIRATORY_TRACT | Status: AC
  Administered 2024-08-01: 2.5 mg via RESPIRATORY_TRACT

## 2024-08-01 NOTE — Patient Instructions (Signed)
 Go to [facility] [phone]) for your chest x-ray).  Drink plenty of water (this helps keep your mucus thin). STOP taking the mucinex sinus-max. This contains a decongestant, and you are already taking a better, separate decongestant (I'm guessing you are taking pseudoephedrine, from what you described, I can't find it in your med list or in outside meds from the pharmacy). Instead take a plain mucinex (guaifenesin as the only ingredient), 12 hour pill, taken twice a day. This is the expectorant to help keep the mucus and phlegm thin.  Continue to take the antihistamine (zyrtec) and decongestant per the instructions from your oral surgeon.  Monitor your blood pressure regularly. The concern with decongestants is that it can raise your blood pressure. Your blood pressure today was quite low, which could make you feel very tired and weak. Check BP regularly to see if your pressure is consistently low or not. If it is consistently low, we can back down on your blood pressure medications (specifically we discussed stopping the hydrochlorothiazide  which can contribute to dehydration; this is in combination with your olmesartan ).  Be in touch with us  within a few days with your blood pressure readings.  We are sending in a prescription for AirSupra --this is albuterol  plus a steroid (works better than albuterol  alone).  Be sure to rinse your mouth after using this inhaler.  Use this as needed for chest tightness and wheezing, shortness of breath.

## 2024-08-02 LAB — CBC WITH DIFFERENTIAL/PLATELET
Basophils Absolute: 0 10*3/uL (ref 0.0–0.2)
Basos: 0 %
EOS (ABSOLUTE): 0 10*3/uL (ref 0.0–0.4)
Eos: 0 %
Hematocrit: 36.7 % (ref 34.0–46.6)
Hemoglobin: 12.4 g/dL (ref 11.1–15.9)
Immature Grans (Abs): 0.1 10*3/uL (ref 0.0–0.1)
Immature Granulocytes: 1 %
Lymphocytes Absolute: 2.4 10*3/uL (ref 0.7–3.1)
Lymphs: 22 %
MCH: 29.7 pg (ref 26.6–33.0)
MCHC: 33.8 g/dL (ref 31.5–35.7)
MCV: 88 fL (ref 79–97)
Monocytes Absolute: 0.6 10*3/uL (ref 0.1–0.9)
Monocytes: 6 %
Neutrophils Absolute: 7.9 10*3/uL — ABNORMAL HIGH (ref 1.4–7.0)
Neutrophils: 71 %
Platelets: 458 10*3/uL — ABNORMAL HIGH (ref 150–450)
RBC: 4.18 x10E6/uL (ref 3.77–5.28)
RDW: 12.5 % (ref 11.7–15.4)
WBC: 11.1 10*3/uL — ABNORMAL HIGH (ref 3.4–10.8)

## 2024-08-02 LAB — COMPREHENSIVE METABOLIC PANEL WITH GFR
ALT: 33 [IU]/L — AB (ref 0–32)
AST: 22 [IU]/L (ref 0–40)
Albumin: 4.2 g/dL (ref 3.8–4.9)
Alkaline Phosphatase: 113 [IU]/L (ref 49–135)
BUN/Creatinine Ratio: 15 (ref 9–23)
BUN: 14 mg/dL (ref 6–24)
Bilirubin Total: 0.5 mg/dL (ref 0.0–1.2)
CO2: 24 mmol/L (ref 20–29)
Calcium: 10 mg/dL (ref 8.7–10.2)
Chloride: 103 mmol/L (ref 96–106)
Creatinine, Ser: 0.96 mg/dL (ref 0.57–1.00)
Globulin, Total: 2.9 g/dL (ref 1.5–4.5)
Glucose: 102 mg/dL — AB (ref 70–99)
Potassium: 4.3 mmol/L (ref 3.5–5.2)
Sodium: 143 mmol/L (ref 134–144)
Total Protein: 7.1 g/dL (ref 6.0–8.5)
eGFR: 69 mL/min/{1.73_m2}

## 2024-08-02 LAB — VITAMIN D 25 HYDROXY (VIT D DEFICIENCY, FRACTURES): Vit D, 25-Hydroxy: 35.4 ng/mL (ref 30.0–100.0)

## 2024-08-02 LAB — SEDIMENTATION RATE: Sed Rate: 53 mm/h — AB (ref 0–40)

## 2024-08-02 LAB — C-REACTIVE PROTEIN: CRP: 26 mg/L — AB (ref 0–10)

## 2025-01-30 ENCOUNTER — Encounter: Payer: Self-pay | Admitting: Family Medicine
